# Patient Record
Sex: Male | Born: 1960 | Race: Black or African American | Hispanic: No | Smoking: Current every day smoker
Health system: Southern US, Community
[De-identification: ages and names within clinical notes are randomized; demographics above are authoritative.]

## PROBLEM LIST (undated history)

## (undated) ENCOUNTER — Emergency Department (HOSPITAL_COMMUNITY): Admission: EM | Payer: Medicaid Other | Source: Home / Self Care

## (undated) DIAGNOSIS — I25811 Atherosclerosis of native coronary artery of transplanted heart without angina pectoris: Secondary | ICD-10-CM

## (undated) DIAGNOSIS — F32A Depression, unspecified: Secondary | ICD-10-CM

## (undated) DIAGNOSIS — E119 Type 2 diabetes mellitus without complications: Secondary | ICD-10-CM

## (undated) DIAGNOSIS — K4090 Unilateral inguinal hernia, without obstruction or gangrene, not specified as recurrent: Secondary | ICD-10-CM

## (undated) DIAGNOSIS — F329 Major depressive disorder, single episode, unspecified: Secondary | ICD-10-CM

## (undated) DIAGNOSIS — E785 Hyperlipidemia, unspecified: Secondary | ICD-10-CM

## (undated) DIAGNOSIS — I639 Cerebral infarction, unspecified: Secondary | ICD-10-CM

## (undated) DIAGNOSIS — D649 Anemia, unspecified: Secondary | ICD-10-CM

## (undated) DIAGNOSIS — I10 Essential (primary) hypertension: Secondary | ICD-10-CM

## (undated) HISTORY — DX: Unilateral inguinal hernia, without obstruction or gangrene, not specified as recurrent: K40.90

## (undated) HISTORY — DX: Anemia, unspecified: D64.9

## (undated) HISTORY — DX: Essential (primary) hypertension: I10

## (undated) HISTORY — DX: Type 2 diabetes mellitus without complications: E11.9

## (undated) HISTORY — DX: Hyperlipidemia, unspecified: E78.5

## (undated) HISTORY — PX: HERNIA REPAIR: SHX51

## (undated) HISTORY — DX: Depression, unspecified: F32.A

## (undated) HISTORY — DX: Major depressive disorder, single episode, unspecified: F32.9

## (undated) HISTORY — DX: Atherosclerosis of native coronary artery of transplanted heart without angina pectoris: I25.811

## (undated) MED FILL — Ferumoxytol Inj 510 MG/17ML (30 MG/ML) (Elemental Fe): INTRAVENOUS | Qty: 17 | Status: AC

## (undated) NOTE — *Deleted (*Deleted)
Name: Vincent Guerrero MRN: [id number] DOB: August 29, 1960 52 y.o. PCP: Simone Curia, MD  Date of Admission: 11/25/2019  3:49 PM Date of Discharge: 11/28/2019 Attending Physician: Gust Rung, DO  Discharge Diagnosis: 1. Symptomatic Iron Deficiency Anemia in the Setting of Gastric Ulcerations 2. Chest Pain with EKG Changes/CAD s/p MI and RCA stent in 2010, Moderate Aortic Stenosis  Discharge Medications: Allergies as of 11/28/2019   No Known Allergies     Medication List    STOP taking these medications   BC HEADACHE POWDER PO   brimonidine 0.2 % ophthalmic solution Commonly known as: ALPHAGAN   Difluprednate 0.05 % Emul   potassium chloride SA 20 MEQ tablet Commonly known as: Klor-Con M20     TAKE these medications   acetaminophen 325 MG tablet Commonly known as: TYLENOL Take 2 tablets (650 mg total) by mouth every 6 (six) hours as needed for mild pain (or Fever >/= 101).   buPROPion 150 MG 12 hr tablet Commonly known as: Zyban Start with 1 tablet daily for 3 days, then increase to 1 tablet twice daily.  Start 1 week before your quit date.   Centrum Silver 50+Men Tabs Take 1 tablet by mouth daily with breakfast.   glipiZIDE 10 MG tablet Commonly known as: GLUCOTROL Take 10 mg by mouth 2 (two) times daily before a meal.   isosorbide mononitrate 60 MG 24 hr tablet Commonly known as: IMDUR Take 1 tablet (60 mg total) by mouth daily. Start taking on: November 29, 2019 What changed:   medication strength  Another medication with the same name was removed. Continue taking this medication, and follow the directions you see here.   lisinopril 20 MG tablet Commonly known as: ZESTRIL Take 1 tablet (20 mg total) by mouth 2 (two) times daily.   metFORMIN 1000 MG tablet Commonly known as: GLUCOPHAGE Take 1,000 mg by mouth 2 (two) times daily with a meal.   metoprolol tartrate 50 MG tablet Commonly known as: LOPRESSOR Take 1 tablet (50 mg total) by mouth 2  (two) times daily.   POTASSIUM PO Take 1 tablet by mouth 3 (three) times a week.   Repatha Pushtronex System 420 MG/3.5ML Soct Generic drug: Evolocumab with Infusor Inject 420 mg into the skin every 30 (thirty) days.   Rocklatan 0.02-0.005 % Soln Generic drug: Netarsudil-Latanoprost Place 1 drop into the right eye at bedtime.       Disposition and follow-up:   Mr.Lavonne B Rueb was discharged from [facility] in Stable condition.  At the hospital follow up visit please address:    Symptomatic Iron Deficiency Anemia in the Setting of Gastric Ulcerations  - F/U With PCP for CBC  - Discuss alternatives to Goody's powder  - Continue Protonix 40 mg twice daily  Chest Pain with EKG Changes/CAD s/p MI and RCA stent in 2010, Moderate Aortic Stenosis  - F/U Cardiology appointment   - Continue Lisinopril 40 mg twice daily  - Continue Imdur 60 mg daily  2.  Labs / imaging needed at time of follow-up: CBC  3.  Pending labs/ test needing follow-up: None  Follow-up Appointments:  Follow-up Information    Tobb, Kardie, DO Follow up on 12/22/2019.   Specialty: Cardiology Why: Cardiology Hospital Follow-up on 12/22/2019 at 2:20 PM. Please call the office if needing a different date or time.  Contact information: [address] [place] [address] [phone]        Simone Curia, MD. Schedule an appointment  as soon as possible for a visit in 1 week(s).   Specialty: Internal Medicine Contact information: [address] [place] [address] [phone]               Hospital Course by problem list: 1. Symptomatic Iron Deficiency Anemia in the Setting of Gastric Ulcerations ***  2. Chest Pain with EKG Changes/CAD s/p MI and RCA stent in 2010, Moderate Aortic Stenosis ***  Discharge Vitals:   BP 138/70   Pulse 75   Temp 99.6 F (37.6 C) (Oral)   Resp 18   Ht 5\' 10"  (1.778 m)   Wt 90.4 kg   SpO2 100%   BMI 28.60 kg/m    Pertinent Labs, Studies, and Procedures:    Ref Range & Units 4 d ago 3 wk ago 3 yr ago  WBC 3.4 - 10.8 x10E3/uL 10.1  9.6  11.8High   RBC 4.14 - 5.80 x10E6/uL 4.54  4.91  5.08   Hemoglobin 13.0 - 17.7 g/dL 2.1HYQ  6.5HQI  69.6EXB   Hematocrit 37.5 - 51.0 % 28.4Low  31.4Low  39.6   MCV 79 - 97 fL 63Low  64Low  78Low   MCH 26.6 - 33.0 pg 17.4Low  17.1Low  24.6Low   MCHC 31 - 35 g/dL 28.4XLK  44.0NUU  72.5   RDW 11.6 - 15.4 % 20.1High  21.6High  17.6High R   Platelets 150 - 450 x10E3/uL 303  325  264 R   Neutrophils Not Estab. % 60     Lymphs Not Estab. % 29     Monocytes Not Estab. % 10     Eos Not Estab. % 1     Basos Not Estab. % 0     Neutrophils Absolute 1.40 - 7.00 x10E3/uL 6.0     Lymphocytes Absolute 0 - 3 x10E3/uL 2.9     Monocytes Absolute 0 - 0 x10E3/uL 1.0High     EOS (ABSOLUTE) 0.0 - 0.4 x10E3/uL 0.1     Basophils Absolute 0 - 0 x10E3/uL 0.0     Immature Granulocytes Not Estab. % 0     Immature Grans (Abs) 0.0 - 0.1 x10E3/uL 0.0       Ref Range & Units 3 d ago  Hgb A1c MFr Bld 4.8 - 5.6 % 6.3High    Component 3 d ago  ABO/RH(D) B POS     Ref Range & Units 2 d ago  Iron 45 - 182 ug/dL 36UYQ   TIBC 034 - 742 ug/dL 595   Saturation Ratios 17.9 - 39.5 % 4Low   UIBC ug/dL 638     Ref Range & Units 2 d ago  Ferritin 24 - 336 ng/mL 4Low         Ref Range & Units 1 d ago 11 mo ago  Cholesterol 0 - 200 mg/dL 82    Triglycerides <756 mg/dL 60  433IRJJ R   HDL >88 mg/dL 41YSA  43 R   Total CHOL/HDL Ratio RATIO 2.2  4.0 R, CM   VLDL 0 - 40 mg/dL 12    LDL Cholesterol 0 - 99 mg/dL 33      COLONOSCOPY (63/02/6008): The perianal and digital rectal examinations were normal. Findings: The terminal ileum appeared normal. The entire examined colon appeared normal on direct and retroflexion views. - The examined portion of the ileum was normal. - The entire examined colon is normal on direct and retroflexion views. - No  specimens collected.  EGD (11/27/2019): The  esophagus was normal. Findings: Two non-bleeding superficial gastric ulcers were found in the gastric antrum and pre-pyloric area. The largest lesion was 8 mm in largest dimension. The antral ulcer had a friable edge. For hemostasis, two hemostatic clips were successful and one hemostatic clip was unsuccessfully placed (MR conditional). There was no bleeding at the end of the procedure. Biopsies were taken from the antrum and gastric body with a cold forceps for histology to rule out H pylori. The cardia and gastric fundus were normal on retroflexion. The examined duodenum was normal.  Discharge Instructions: Discharge Instructions    Call MD for:  difficulty breathing, headache or visual disturbances   Complete by: As directed    Call MD for:  extreme fatigue   Complete by: As directed    Call MD for:  persistant dizziness or light-headedness   Complete by: As directed    Call MD for:  persistant nausea and vomiting   Complete by: As directed    Call MD for:  severe uncontrolled pain   Complete by: As directed    Diet - low sodium heart healthy   Complete by: As directed    Increase activity slowly   Complete by: As directed       Signed: Dolan Amen, MD 11/28/2019, 12:45 PM   Pager: [phone]

---

## 2000-06-24 ENCOUNTER — Inpatient Hospital Stay (HOSPITAL_COMMUNITY): Admission: EM | Admit: 2000-06-24 | Discharge: 2000-06-27 | Payer: Self-pay | Admitting: Emergency Medicine

## 2000-06-28 ENCOUNTER — Ambulatory Visit (HOSPITAL_BASED_OUTPATIENT_CLINIC_OR_DEPARTMENT_OTHER): Admission: RE | Admit: 2000-06-28 | Discharge: 2000-06-28 | Payer: Self-pay | Admitting: Orthopedic Surgery

## 2000-07-05 ENCOUNTER — Ambulatory Visit (HOSPITAL_BASED_OUTPATIENT_CLINIC_OR_DEPARTMENT_OTHER): Admission: RE | Admit: 2000-07-05 | Discharge: 2000-07-05 | Payer: Self-pay | Admitting: Orthopedic Surgery

## 2001-07-17 ENCOUNTER — Inpatient Hospital Stay (HOSPITAL_COMMUNITY): Admission: EM | Admit: 2001-07-17 | Discharge: 2001-07-18 | Payer: Self-pay | Admitting: Emergency Medicine

## 2001-07-17 ENCOUNTER — Encounter: Payer: Self-pay | Admitting: *Deleted

## 2001-07-17 ENCOUNTER — Encounter: Payer: Self-pay | Admitting: Emergency Medicine

## 2016-05-06 DIAGNOSIS — I2510 Atherosclerotic heart disease of native coronary artery without angina pectoris: Secondary | ICD-10-CM

## 2016-05-06 DIAGNOSIS — I639 Cerebral infarction, unspecified: Secondary | ICD-10-CM | POA: Insufficient documentation

## 2016-05-06 HISTORY — DX: Atherosclerotic heart disease of native coronary artery without angina pectoris: I25.10

## 2016-05-09 DIAGNOSIS — K4021 Bilateral inguinal hernia, without obstruction or gangrene, recurrent: Secondary | ICD-10-CM

## 2016-05-09 HISTORY — DX: Bilateral inguinal hernia, without obstruction or gangrene, recurrent: K40.21

## 2016-05-10 ENCOUNTER — Encounter: Payer: Self-pay | Admitting: Physician Assistant

## 2016-05-10 NOTE — Progress Notes (Signed)
Cardiology Office Note    Date:  05/11/2016   ID:  HAVOC SANLUIS, DOB [birth year], MRN [id number]  PCP:  Dr. Nedra Hai - in [place] Cardiologist:  New  CC: pre op clearance - bilateral inguinal hernia repair  History of Present Illness:  Vincent Guerrero is a 56 y.o. male with a history of tobacco abuse, MI with stent placement in 2010 in [place], CVA (2011) in [place], DMT2, HTN, and HLD that presents to clinic for pre operative clearance.   He has a history of CAD with MI in 2010 in [place]. No records available. He followed with a cardiologist at [facility] for about 6 months but then lost to follow up. He does not take a daily baby Asipirin but takes BC powder at least twice a day for aches and pains. Does not take a statin but says he has his cholesterol levels are under pretty good control by eating [place] nuts (heard it from Dr. Neil Crouch). He did not tolerate statins in the past. He states that his diabetes is pretty under control. Dr. Nedra Hai, his PCP, recently did a bunch of lab work and it was okay. None of this sent to me.    He had a right inguinal hernia repair on right side around 1995 and now having trouble on right side again. He saw Dr. Earney Navy in [place] who is planning on repairing bilateral inguinal hernias and asking for cardiac clearance.   He has been smoking about 1 PPD ~ 40 years. 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 He denies exertional chest pain. He does not formally exercise. No CP or SOB. No LE edema, orthopnea or PND. No dizziness or syncope. No blood in stool or urine. Occasionally get palpitations. He basically feels pretty good. Mostly limited by his groin pain and needing hernia repair.    Past Medical History:  Diagnosis Date  . Depression   . Diabetes mellitus (HCC)   . Hypertension   . Inguinal hernia without obstruction or gangrene    BILATERAL    Past Surgical History:  Procedure Laterality Date  . HERNIA REPAIR      Current  Medications: Outpatient Medications Prior to Visit  Medication Sig Dispense Refill  . Aspirin-Caffeine 845-65 MG PACK Take 1 packet by mouth every 4 (four) hours as needed. BC     . glipiZIDE (GLUCOTROL) 10 MG tablet Take 10 mg by mouth 2 (two) times daily before a meal.     . metFORMIN (GLUMETZA) 1000 MG (MOD) 24 hr tablet Take 1,000 mg by mouth 2 (two) times daily with a meal.     . lisinopril (PRINIVIL,ZESTRIL) 20 MG tablet Take 20 mg by mouth 2 (two) times daily.     . metoprolol (LOPRESSOR) 50 MG tablet Take 50 mg by mouth 2 (two) times daily.     No facility-administered medications prior to visit.      Allergies:   Patient has no known allergies.   Social History   Social History  . Marital status: Legally Separated    Spouse name: N/A  . Number of children: N/A  . Years of education: N/A   Social History Main Topics  . Smoking status: Current Every Day Smoker    Types: Cigarettes  . Smokeless tobacco: Current User    Types: Chew  . Alcohol use No  . Drug use: No  . Sexual activity: Not Asked   Other Topics Concern  . None  Social History Narrative  . None     Family History:  The patient's family history includes CAD in his father; Heart failure in his mother.      ROS:   Please see the history of present illness.    ROS All other systems reviewed and are negative.   PHYSICAL EXAM:   VS:  BP (!) 160/82 (BP Location: Left Arm)   Pulse (!) 59   Ht  (1.778 m)   Wt 220 lb 12.8 oz (100.2 kg)   BMI 31.68 kg/m    GEN: Well nourished, well developed, in no acute distress  HEENT: normal  Neck: no JVD, carotid bruits, or masses Cardiac: RRR; + murmurs, rubs, or gallops,no edema  Respiratory:  clear to auscultation bilaterally, normal work of breathing GI: soft, nontender, nondistended, + BS MS: no deformity or atrophy  Skin: warm and dry, no rash Neuro:  Alert and Oriented x 3, Strength and sensation are intact Psych: euthymic mood, full  affect    Wt Readings from Last 3 Encounters:  05/11/16 220 lb 12.8 oz (100.2 kg)     Studies/Labs Reviewed:   EKG:  EKG is ordered today.  The ekg ordered today demonstrates Sinus bradycardia HR 59  Recent Labs: No results found for requested labs within last 8760 hours.   Lipid Panel No results found for: CHOL, TRIG, HDL, CHOLHDL, VLDL, LDLCALC, LDLDIRECT  Additional studies/ records that were reviewed today include:  none   ASSESSMENT & PLAN:   Pre operative risk assessment:  Will a nuclear stress test before clearing his for surgery. Also will get a 2D ECHO given 3/6 murmur heard on physical exam. I have also asked that Dr. Nedra Hai fax [place] all blood work.   HTN: BP currently uncontrolled on Lopressor  BID and Lisinorpil  BID. Cannot titrate BB due to resting bradcyardia. Will add amldopine  daily to help with BP control.   CAD s/p previous stenting: no records available on this. I have advised that he stop taking BC powder and only take a daily baby aspirin, but he said its the only thing that helps his pain. This has 845 mg of Aspirin in it and he takes it at least 2 times a day. Continue BB. Intolerant to statins.   DMT2: continue glipzide and metformin. Followed by Dr. Nedra Hai  HLD: intolerant to statins, followed by Dr. Nedra Hai  Tobacco abuse: he has tried to quit in the past but has not been successful.   Murmur: he has been told he has a murmur before but said he never had an echo. Will get an echo.   Medication Adjustments/Labs and Tests Ordered: Current medicines are reviewed at length with the patient today.  Concerns regarding medicines are outlined above.  Medication changes, Labs and Tests ordered today are listed in the Patient Instructions below. Patient Instructions  Medication Instructions:  Your physician has recommended you make the following change in your medication:  1.  START Amlodipine 5 mg taking 1 tablet daily  Labwork: None ordered PLEASE  HAVE YOUR PCP FAX [place] YOUR LAB RESULTS TO [phone]  Testing/Procedures: Your physician has requested that you have an echocardiogram. Echocardiography is a painless test that uses sound waves to create images of your heart. It provides your doctor with information about the size and shape of your heart and how well your heart's chambers and valves are working. This procedure takes approximately one hour. There are no restrictions for this procedure.  Your physician  has requested that you have a lexiscan myoview. For further information please visit [email or web address] Please follow instruction sheet, as given.    Follow-Up: Your physician recommends that you schedule a follow-up appointment in: 1 MONTH WITH KATIE Tavian Callander, PA-C   Any Other Special Instructions Will Be Listed Below (If Applicable). Echocardiogram An echocardiogram, or echocardiography, uses sound waves (ultrasound) to produce an image of your heart. The echocardiogram is simple, painless, obtained within a short period of time, and offers valuable information to your health care provider. The images from an echocardiogram can provide information such as:  Evidence of coronary artery disease (CAD).  Heart size.  Heart muscle function.  Heart valve function.  Aneurysm detection.  Evidence of a past heart attack.  Fluid buildup around the heart.  Heart muscle thickening.  Assess heart valve function. Tell a health care provider about:  Any allergies you have.  All medicines you are taking, including vitamins, herbs, eye drops, creams, and over-the-counter medicines.  Any problems you or family members have had with anesthetic medicines.  Any blood disorders you have.  Any surgeries you have had.  Any medical conditions you have.  Whether you are pregnant or may be pregnant. What happens before the procedure? No special preparation is needed. Eat and drink normally. What happens during the  procedure?  In order to produce an image of your heart, gel will be applied to your chest and a wand-like tool (transducer) will be moved over your chest. The gel will help transmit the sound waves from the transducer. The sound waves will harmlessly bounce off your heart to allow the heart images to be captured in real-time motion. These images will then be recorded.  You may need an IV to receive a medicine that improves the quality of the pictures. What happens after the procedure? You may return to your normal schedule including diet, activities, and medicines, unless your health care provider tells you otherwise. This information is not intended to replace advice given to you by your health care provider. Make sure you discuss any questions you have with your health care provider. Document Released: 01/20/2000 Document Revised: 09/10/2015 Document Reviewed: 09/29/2012 Elsevier Interactive Patient Education  2017 Elsevier Inc.    Pharmacologic Stress Electrocardiogram Introduction A pharmacologic stress electrocardiogram is a heart (cardiac) test that uses nuclear imaging to evaluate the blood supply to your heart. This test may also be called a pharmacologic stress electrocardiography. Pharmacologic means that a medicine is used to increase your heart rate and blood pressure. This stress test is done to find areas of poor blood flow to the heart by determining the extent of coronary artery disease (CAD). Some people exercise on a treadmill, which naturally increases the blood flow to the heart. For those people unable to exercise on a treadmill, a medicine is used. This medicine stimulates your heart and will cause your heart to beat harder and more quickly, as if you were exercising. Pharmacologic stress tests can help determine:  The adequacy of blood flow to your heart during increased levels of activity in order to clear you for discharge home.  The extent of coronary artery blockage  caused by CAD.  Your prognosis if you have suffered a heart attack.  The effectiveness of cardiac procedures done, such as an angioplasty, which can increase the circulation in your coronary arteries.  Causes of chest pain or pressure. LET [facility] CARE PROVIDER KNOW ABOUT:  Any allergies you have.  All medicines you are taking,  including vitamins, herbs, eye drops, creams, and over-the-counter medicines.  Previous problems you or members of your family have had with the use of anesthetics.  Any blood disorders you have.  Previous surgeries you have had.  Medical conditions you have.  Possibility of pregnancy, if this applies.  If you are currently breastfeeding. RISKS AND COMPLICATIONS Generally, this is a safe procedure. However, as with any procedure, complications can occur. Possible complications include:  You develop pain or pressure in the following areas:  Chest.  Jaw or neck.  Between your shoulder blades.  Radiating down your left arm.  Headache.  Dizziness or light-headedness.  Shortness of breath.  Increased or irregular heartbeat.  Low blood pressure.  Nausea or vomiting.  Flushing.  Redness going up the arm and slight pain during injection of medicine.  Heart attack (rare). BEFORE THE PROCEDURE  Avoid all forms of caffeine for 24 hours before your test or as directed by your health care provider. This includes coffee, tea (even decaffeinated tea), caffeinated sodas, chocolate, cocoa, and certain pain medicines.  Follow your health care provider's instructions regarding eating and drinking before the test.  Take your medicines as directed at regular times with water unless instructed otherwise. Exceptions may include:  If you have diabetes, ask how you are to take your insulin or pills. It is common to adjust insulin dosing the morning of the test.  If you are taking beta-blocker medicines, it is important to talk to your health care  provider about these medicines well before the date of your test. Taking beta-blocker medicines may interfere with the test. In some cases, these medicines need to be changed or stopped 24 hours or more before the test.  If you wear a nitroglycerin patch, it may need to be removed prior to the test. Ask your health care provider if the patch should be removed before the test.  If you use an inhaler for any breathing condition, bring it with you to the test.  If you are an outpatient, bring a snack so you can eat right after the stress phase of the test.  Do not smoke for 4 hours prior to the test or as directed by your health care provider.  Do not apply lotions, powders, creams, or oils on your chest prior to the test.  Wear comfortable shoes and clothing. Let your health care provider know if you were unable to complete or follow the preparations for your test. PROCEDURE  Multiple patches (electrodes) will be put on your chest. If needed, small areas of your chest may be shaved to get better contact with the electrodes. Once the electrodes are attached to your body, multiple wires will be attached to the electrodes, and your heart rate will be monitored.  An IV access will be started. A nuclear trace (isotope) is given. The isotope may be given intravenously, or it may be swallowed. Nuclear refers to several types of radioactive isotopes, and the nuclear isotope lights up the arteries so that the nuclear images are clear. The isotope is absorbed by your body. This results in low radiation exposure.  A resting nuclear image is taken to show how your heart functions at rest.  A medicine is given through the IV access.  A second scan is done about 1 hour after the medicine injection and determines how your heart functions under stress.  During this stress phase, you will be connected to an electrocardiogram machine. Your blood pressure and oxygen levels will be  monitored. What to expect  after the procedure  Your heart rate and blood pressure will be monitored after the test.  You may return to your normal schedule, including diet,activities, and medicines, unless your health care provider tells you otherwise. This information is not intended to replace advice given to you by your health care provider. Make sure you discuss any questions you have with your health care provider. Document Released: 06/10/2008 Document Revised: 06/30/2015 Document Reviewed: 08/01/2015 Elsevier Interactive Patient Education  2017 [employer].   If you need a refill on your cardiac medications before your next appointment, please call your pharmacy.      Signed, Cline Crock, PA-C  05/11/2016 12:15 PM    [facility] [address] [place], [place], [place]  [address] Phone: [phone]; Fax: [phone]

## 2016-05-11 ENCOUNTER — Encounter (INDEPENDENT_AMBULATORY_CARE_PROVIDER_SITE_OTHER): Payer: Self-pay

## 2016-05-11 ENCOUNTER — Encounter: Payer: Self-pay | Admitting: Physician Assistant

## 2016-05-11 ENCOUNTER — Ambulatory Visit (INDEPENDENT_AMBULATORY_CARE_PROVIDER_SITE_OTHER): Payer: Self-pay | Admitting: Physician Assistant

## 2016-05-11 ENCOUNTER — Other Ambulatory Visit: Payer: Self-pay | Admitting: Physician Assistant

## 2016-05-11 VITALS — BP 160/82 | HR 59 | Ht 70.0 in | Wt 220.8 lb

## 2016-05-11 DIAGNOSIS — E785 Hyperlipidemia, unspecified: Secondary | ICD-10-CM

## 2016-05-11 DIAGNOSIS — I2510 Atherosclerotic heart disease of native coronary artery without angina pectoris: Secondary | ICD-10-CM

## 2016-05-11 DIAGNOSIS — R011 Cardiac murmur, unspecified: Secondary | ICD-10-CM

## 2016-05-11 DIAGNOSIS — Z720 Tobacco use: Secondary | ICD-10-CM

## 2016-05-11 DIAGNOSIS — Z01810 Encounter for preprocedural cardiovascular examination: Secondary | ICD-10-CM

## 2016-05-11 DIAGNOSIS — E118 Type 2 diabetes mellitus with unspecified complications: Secondary | ICD-10-CM

## 2016-05-11 DIAGNOSIS — I10 Essential (primary) hypertension: Secondary | ICD-10-CM

## 2016-05-11 MED ORDER — LISINOPRIL 20 MG PO TABS: 20.0000 mg | ORAL_TABLET | Freq: Two times a day (BID) | ORAL | 2 refills | Status: DC

## 2016-05-11 MED ORDER — AMLODIPINE BESYLATE 5 MG PO TABS: 5.0000 mg | ORAL_TABLET | Freq: Every day | ORAL | 3 refills | Status: DC

## 2016-05-11 MED ORDER — METOPROLOL TARTRATE 50 MG PO TABS: 50.0000 mg | ORAL_TABLET | Freq: Two times a day (BID) | ORAL | 2 refills | Status: AC

## 2016-05-11 NOTE — Patient Instructions (Signed)
Medication Instructions:  Your physician has recommended you make the following change in your medication:  1.  START Amlodipine 5 mg taking 1 tablet daily  Labwork: None ordered PLEASE HAVE YOUR PCP FAX [place] YOUR LAB RESULTS TO [phone]  Testing/Procedures: Your physician has requested that you have an echocardiogram. Echocardiography is a painless test that uses sound waves to create images of your heart. It provides your doctor with information about the size and shape of your heart and how well your heart's chambers and valves are working. This procedure takes approximately one hour. There are no restrictions for this procedure.  Your physician has requested that you have a lexiscan myoview. For further information please visit [email or web address] Please follow instruction sheet, as given.    Follow-Up: Your physician recommends that you schedule a follow-up appointment in: 1 MONTH WITH KATIE THOMPSON, PA-C   Any Other Special Instructions Will Be Listed Below (If Applicable). Echocardiogram An echocardiogram, or echocardiography, uses sound waves (ultrasound) to produce an image of your heart. The echocardiogram is simple, painless, obtained within a short period of time, and offers valuable information to your health care provider. The images from an echocardiogram can provide information such as:  Evidence of coronary artery disease (CAD).  Heart size.  Heart muscle function.  Heart valve function.  Aneurysm detection.  Evidence of a past heart attack.  Fluid buildup around the heart.  Heart muscle thickening.  Assess heart valve function. Tell a health care provider about:  Any allergies you have.  All medicines you are taking, including vitamins, herbs, eye drops, creams, and over-the-counter medicines.  Any problems you or family members have had with anesthetic medicines.  Any blood disorders you have.  Any surgeries you have had.  Any medical  conditions you have.  Whether you are pregnant or may be pregnant. What happens before the procedure? No special preparation is needed. Eat and drink normally. What happens during the procedure?  In order to produce an image of your heart, gel will be applied to your chest and a wand-like tool (transducer) will be moved over your chest. The gel will help transmit the sound waves from the transducer. The sound waves will harmlessly bounce off your heart to allow the heart images to be captured in real-time motion. These images will then be recorded.  You may need an IV to receive a medicine that improves the quality of the pictures. What happens after the procedure? You may return to your normal schedule including diet, activities, and medicines, unless your health care provider tells you otherwise. This information is not intended to replace advice given to you by your health care provider. Make sure you discuss any questions you have with your health care provider. Document Released: 01/20/2000 Document Revised: 09/10/2015 Document Reviewed: 09/29/2012 Elsevier Interactive Patient Education  2017 Elsevier Inc.    Pharmacologic Stress Electrocardiogram Introduction A pharmacologic stress electrocardiogram is a heart (cardiac) test that uses nuclear imaging to evaluate the blood supply to your heart. This test may also be called a pharmacologic stress electrocardiography. Pharmacologic means that a medicine is used to increase your heart rate and blood pressure. This stress test is done to find areas of poor blood flow to the heart by determining the extent of coronary artery disease (CAD). Some people exercise on a treadmill, which naturally increases the blood flow to the heart. For those people unable to exercise on a treadmill, a medicine is used. This medicine stimulates your heart and will cause  your heart to beat harder and more quickly, as if you were exercising. Pharmacologic stress  tests can help determine:  The adequacy of blood flow to your heart during increased levels of activity in order to clear you for discharge home.  The extent of coronary artery blockage caused by CAD.  Your prognosis if you have suffered a heart attack.  The effectiveness of cardiac procedures done, such as an angioplasty, which can increase the circulation in your coronary arteries.  Causes of chest pain or pressure. LET [facility] CARE PROVIDER KNOW ABOUT:  Any allergies you have.  All medicines you are taking, including vitamins, herbs, eye drops, creams, and over-the-counter medicines.  Previous problems you or members of your family have had with the use of anesthetics.  Any blood disorders you have.  Previous surgeries you have had.  Medical conditions you have.  Possibility of pregnancy, if this applies.  If you are currently breastfeeding. RISKS AND COMPLICATIONS Generally, this is a safe procedure. However, as with any procedure, complications can occur. Possible complications include:  You develop pain or pressure in the following areas:  Chest.  Jaw or neck.  Between your shoulder blades.  Radiating down your left arm.  Headache.  Dizziness or light-headedness.  Shortness of breath.  Increased or irregular heartbeat.  Low blood pressure.  Nausea or vomiting.  Flushing.  Redness going up the arm and slight pain during injection of medicine.  Heart attack (rare). BEFORE THE PROCEDURE  Avoid all forms of caffeine for 24 hours before your test or as directed by your health care provider. This includes coffee, tea (even decaffeinated tea), caffeinated sodas, chocolate, cocoa, and certain pain medicines.  Follow your health care provider's instructions regarding eating and drinking before the test.  Take your medicines as directed at regular times with water unless instructed otherwise. Exceptions may include:  If you have diabetes, ask how you  are to take your insulin or pills. It is common to adjust insulin dosing the morning of the test.  If you are taking beta-blocker medicines, it is important to talk to your health care provider about these medicines well before the date of your test. Taking beta-blocker medicines may interfere with the test. In some cases, these medicines need to be changed or stopped 24 hours or more before the test.  If you wear a nitroglycerin patch, it may need to be removed prior to the test. Ask your health care provider if the patch should be removed before the test.  If you use an inhaler for any breathing condition, bring it with you to the test.  If you are an outpatient, bring a snack so you can eat right after the stress phase of the test.  Do not smoke for 4 hours prior to the test or as directed by your health care provider.  Do not apply lotions, powders, creams, or oils on your chest prior to the test.  Wear comfortable shoes and clothing. Let your health care provider know if you were unable to complete or follow the preparations for your test. PROCEDURE  Multiple patches (electrodes) will be put on your chest. If needed, small areas of your chest may be shaved to get better contact with the electrodes. Once the electrodes are attached to your body, multiple wires will be attached to the electrodes, and your heart rate will be monitored.  An IV access will be started. A nuclear trace (isotope) is given. The isotope may be given intravenously,  or it may be swallowed. Nuclear refers to several types of radioactive isotopes, and the nuclear isotope lights up the arteries so that the nuclear images are clear. The isotope is absorbed by your body. This results in low radiation exposure.  A resting nuclear image is taken to show how your heart functions at rest.  A medicine is given through the IV access.  A second scan is done about 1 hour after the medicine injection and determines how your  heart functions under stress.  During this stress phase, you will be connected to an electrocardiogram machine. Your blood pressure and oxygen levels will be monitored. What to expect after the procedure  Your heart rate and blood pressure will be monitored after the test.  You may return to your normal schedule, including diet,activities, and medicines, unless your health care provider tells you otherwise. This information is not intended to replace advice given to you by your health care provider. Make sure you discuss any questions you have with your health care provider. Document Released: 06/10/2008 Document Revised: 06/30/2015 Document Reviewed: 08/01/2015 Elsevier Interactive Patient Education  2017 [employer].   If you need a refill on your cardiac medications before your next appointment, please call your pharmacy.

## 2016-05-24 ENCOUNTER — Telehealth (HOSPITAL_COMMUNITY): Payer: Self-pay | Admitting: *Deleted

## 2016-05-24 NOTE — Telephone Encounter (Signed)
Patient given detailed instructions per Myocardial Perfusion Study Information Sheet for the test on 05/29/16. Patient notified to arrive 15 minutes early and that it is imperative to arrive on time for appointment to keep from having the test rescheduled.  If you need to cancel or reschedule your appointment, please call the office within 24 hours of your appointment. Failure to do so may result in a cancellation of your appointment, and a $50 no show fee. Patient verbalized understanding.  [name]     

## 2016-05-29 ENCOUNTER — Ambulatory Visit: Payer: Self-pay | Admitting: Physician Assistant

## 2016-05-29 ENCOUNTER — Other Ambulatory Visit: Payer: Self-pay

## 2016-05-29 ENCOUNTER — Encounter: Payer: Self-pay | Admitting: Physician Assistant

## 2016-05-29 ENCOUNTER — Ambulatory Visit (HOSPITAL_COMMUNITY): Payer: Self-pay | Attending: Cardiovascular Disease

## 2016-05-29 ENCOUNTER — Ambulatory Visit (HOSPITAL_BASED_OUTPATIENT_CLINIC_OR_DEPARTMENT_OTHER): Payer: Self-pay

## 2016-05-29 DIAGNOSIS — E785 Hyperlipidemia, unspecified: Secondary | ICD-10-CM | POA: Insufficient documentation

## 2016-05-29 DIAGNOSIS — R011 Cardiac murmur, unspecified: Secondary | ICD-10-CM | POA: Insufficient documentation

## 2016-05-29 DIAGNOSIS — Z720 Tobacco use: Secondary | ICD-10-CM | POA: Insufficient documentation

## 2016-05-29 DIAGNOSIS — I2510 Atherosclerotic heart disease of native coronary artery without angina pectoris: Secondary | ICD-10-CM

## 2016-05-29 DIAGNOSIS — I252 Old myocardial infarction: Secondary | ICD-10-CM | POA: Insufficient documentation

## 2016-05-29 DIAGNOSIS — I10 Essential (primary) hypertension: Secondary | ICD-10-CM | POA: Insufficient documentation

## 2016-05-29 DIAGNOSIS — I352 Nonrheumatic aortic (valve) stenosis with insufficiency: Secondary | ICD-10-CM | POA: Insufficient documentation

## 2016-05-29 LAB — MYOCARDIAL PERFUSION IMAGING
CHL CUP NUCLEAR SRS: 2
CSEPPHR: 90 {beats}/min
LV dias vol: 161 mL (ref 62–150)
LV sys vol: 81 mL
RATE: 0.27
Rest HR: 59 {beats}/min
SDS: 0
SSS: 2
TID: 1.08

## 2016-05-29 MED ORDER — TECHNETIUM TC 99M TETROFOSMIN IV KIT
32.1000 | PACK | Freq: Once | INTRAVENOUS | Status: AC | PRN
  Administered 2016-05-29: 32.1 via INTRAVENOUS
  Filled 2016-05-29: qty 33

## 2016-05-29 MED ORDER — REGADENOSON 0.4 MG/5ML IV SOLN
0.4000 mg | Freq: Once | INTRAVENOUS | Status: AC
  Administered 2016-05-29: 0.4 mg via INTRAVENOUS

## 2016-05-29 MED ORDER — TECHNETIUM TC 99M TETROFOSMIN IV KIT
10.2000 | PACK | Freq: Once | INTRAVENOUS | Status: AC | PRN
  Administered 2016-05-29: 10.2 via INTRAVENOUS
  Filled 2016-05-29: qty 11

## 2016-05-30 NOTE — Progress Notes (Signed)
Cardiology Office Note    Date:  05/31/2016   ID:  Vincent Guerrero, DOB Dec 17, 1960, MRN [id number]  PCP:  No PCP Per Patient  Cardiologist:  New  CC: follow up- discuss abnormal stress test and cath.   History of Present Illness:  Vincent Guerrero is a 56 y.o. male with a history of tobacco abuse, MI with stent placement in 2010 in [place], CVA (2011) in [place], DMT2, HTN, aortic stenosis and HLD that presents to clinic to discuss abnormal stress test.   He has a history of CAD with MI in 2010 in [place]. No records available. He followed with a cardiologist at [facility] for about 6 months but then lost to follow up. He does not take a daily baby Asipirin but takes BC powder at least twice a day for aches and pains. Does not take a statin but says he has his cholesterol levels are under pretty good control by eating [place] nuts (heard it from Dr. Neil Crouch). He did not tolerate statins in the past. He states that his diabetes is under control. Dr. Nedra Hai, his PCP, recently did a bunch of lab work and it was okay. None of this sent to me.    I saw him in clinic as a new patient on 05/11/16 for cardiology clearance prior to bilateral inguinal hernia repair. I heard a murmur on exam so I ordered an echo and also a stress test for pre op risk assessment. BP was elevated to 160/82 and I added amlodipine  daily. 2D ECHO 05/29/16 showed normal EF with G2DD and mod AS/AI. Nuclear stress test on 05/29/16 showed prior inferior infarct and mild to moderate peri-infarct ischemia; prior anteroseptal/apical infarct with mild peri-infarct ischemia; EF 50 with global hypokinesis and mild LVE.  Today he presents to clinic for follow up. No CP or SOB. No LE edema, orthopnea or PND. He had had some dizziness but no syncope. Not related to any certain No blood in stool or urine. No palpitations. Still having groin pain related to hernia.   ADDENDUM: later he told my nurse that he is having some chest pain that he  had been ignoring.     Past Medical History:  Diagnosis Date  . Depression   . Diabetes mellitus (HCC)   . Hypertension   . Inguinal hernia without obstruction or gangrene    BILATERAL    Past Surgical History:  Procedure Laterality Date  . HERNIA REPAIR      Current Medications: Outpatient Medications Prior to Visit  Medication Sig Dispense Refill  . Aspirin-Caffeine 845-65 MG PACK Take 1 packet by mouth every 4 (four) hours as needed. BC     . glipiZIDE (GLUCOTROL) 10 MG tablet Take 10 mg by mouth 2 (two) times daily before a meal.     . lisinopril (PRINIVIL,ZESTRIL) 20 MG tablet Take 1 tablet (20 mg total) by mouth 2 (two) times daily. 180 tablet 2  . metFORMIN (GLUMETZA) 1000 MG (MOD) 24 hr tablet Take 1,000 mg by mouth 2 (two) times daily with a meal.     . metoprolol (LOPRESSOR) 50 MG tablet Take 1 tablet (50 mg total) by mouth 2 (two) times daily. 180 tablet 2  . amLODipine (NORVASC) 5 MG tablet Take 1 tablet (5 mg total) by mouth daily. 90 tablet 3   No facility-administered medications prior to visit.      Allergies:   Patient has no known allergies.   Social History   Social  History  . Marital status: Legally Separated    Spouse name: N/A  . Number of children: N/A  . Years of education: N/A   Social History Main Topics  . Smoking status: Current Every Day Smoker    Types: Cigarettes  . Smokeless tobacco: Current User    Types: Chew  . Alcohol use No  . Drug use: No  . Sexual activity: Not Asked   Other Topics Concern  . None   Social History Narrative  . None     Family History:  The patient's family history includes CAD in his father; Heart failure in his mother.      ROS:   Please see the history of present illness.    ROS All other systems reviewed and are negative.   PHYSICAL EXAM:   VS:  BP 138/68   Pulse 68   Ht  (1.778 m)   Wt 217 lb 15.7 oz (98.9 kg)   SpO2 94%   BMI 31.28 kg/m    GEN: Well nourished, well developed, in  no acute distress  HEENT: normal  Neck: no JVD, carotid bruits, or masses Cardiac: RRR; 3/6 SEM @ RUSB, rubs, or gallops,no edema  Respiratory:  clear to auscultation bilaterally, normal work of breathing GI: soft, nontender, nondistended, + BS MS: no deformity or atrophy  Skin: warm and dry, no rash Neuro:  Alert and Oriented x 3, Strength and sensation are intact Psych: euthymic mood, full affect    Wt Readings from Last 3 Encounters:  05/31/16 217 lb 15.7 oz (98.9 kg)  05/29/16 220 lb (99.8 kg)  05/11/16 220 lb 12.8 oz (100.2 kg)      Studies/Labs Reviewed:   EKG:  EKG is NOT ordered today.   Recent Labs: No results found for requested labs within last 8760 hours.   Lipid Panel No results found for: CHOL, TRIG, HDL, CHOLHDL, VLDL, LDLCALC, LDLDIRECT  Additional studies/ records that were reviewed today include:  Myoview 05/29/16 Study Highlights    Nuclear stress EF: 50%.  There was no ST segment deviation noted during stress.  Defect 1: There is a large defect of severe severity present in the basal inferior and mid inferior location.  Defect 2: There is a large defect of moderate severity present in the mid anteroseptal and apex location.  This is a high risk study.  The left ventricular ejection fraction is mildly decreased (45-54%).   High risk stress nuclear study with prior inferior infarct and mild to moderate peri-infarct ischemia; prior anteroseptal/apical infarct with mild peri-infarct ischemia; EF 50 with global hypokinesis and mild LVE.     2D ECHO: 05/29/2016 LV EF: 60% -   65% Study Conclusions - Left ventricle: The cavity size was normal. Wall thickness was   normal. Systolic function was normal. The estimated ejection   fraction was in the range of 60% to 65%. Wall motion was normal;   there were no regional wall motion abnormalities. Features are   consistent with a pseudonormal left ventricular filling pattern,   with concomitant  abnormal relaxation and increased filling   pressure (grade 2 diastolic dysfunction). - Aortic valve: There was moderate stenosis. There was moderate   regurgitation. Valve area (VTI): 1.75 cm^2. Valve area (Vmax):   1.71 cm^2. Valve area (Vmean): 1.57 cm^2.   ASSESSMENT & PLAN:   Abnormal nuclear stress test: ordered for preop clearance prior to bilateral inguinal hernia repair. This was completed 05/29/16 and showed prior inferior infarct and mild  to moderate peri-infarct ischemia; prior anteroseptal/apical infarct with mild peri-infarct ischemia; EF 50 with global hypokinesis and mild LVE. He later told my nurse he was having some chest pain from time to time. I discussed case with Dr. Melburn Popper (DOD) today and he agreed with cath. I have arranged this for 06/04/16 with Dr. Katrinka Blazing. Will get pre cath labs today.   I have reviewed the risks, indications, and alternatives to cardiac catheterization and possible angioplasty/stenting with the patient. Risks include but are not limited to bleeding, infection, vascular injury, stroke, myocardial infection, arrhythmia, kidney injury, radiation-related injury in the case of prolonged fluoroscopy use, emergency cardiac surgery, and death. The patient understands the risks of serious complication is low (<1%).   Mod AS/AI: continue to monitor   HTN: BP better controlled with the addition of amlodipine  daily. Still mildly elevated at 138/68 (according to new guidelines) so I will increase amlodipine to  daily. Continue Lopressor  BID and Lisinorpil  BID  CAD s/p previous stenting: no records available on this. He takes BC powder with  of ASA in this every day. Continue BB. He is intolerant to statins. Will proceed with LHC as above given abnormal nuclear stress test.   DMT2: Labs faxed from PCP and HgA1c was 6.3 in 03/2016  HLD: most recent LDL was 123. Intolerant to statins. Will wait on results to [facility] next Monday. If he has significant  progression of CAD, I will push for him to be seen in the lipid clinic.   Tobacco abuse: still smoking about 1PPD. Discussed the importance of cessation.   Medication Adjustments/Labs and Tests Ordered: Current medicines are reviewed at length with the patient today.  Concerns regarding medicines are outlined above.  Medication changes, Labs and Tests ordered today are listed in the Patient Instructions below. Patient Instructions  Medication Instructions:  Your physician has recommended you make the following change in your medication:  1.  INCREASE the Amlodipine to 10 mg taking 1 tablet daily  Labwork: TODAY:  BMET, CBC, & PT/INR  Testing/Procedures: Your physician has requested that you have a cardiac catheterization. Cardiac catheterization is used to diagnose and/or treat various heart conditions. Doctors may recommend this procedure for a number of different reasons. The most common reason is to evaluate chest pain. Chest pain can be a symptom of coronary artery disease (CAD), and cardiac catheterization can show whether plaque is narrowing or blocking your heart's arteries. This procedure is also used to evaluate the valves, as well as measure the blood flow and oxygen levels in different parts of your heart. For further information please visit [email or web address] Please follow instruction sheet, as given.    Follow-Up: Your physician recommends that you schedule a follow-up appointment in: WILL BE SET UP AT DISCHARGE   Any Other Special Instructions Will Be Listed Below (If Applicable).    [facility] CARDIOVASCULAR DIVISION [facility] OFFICE [address], [address] [place] [address] Dept: [phone] Loc: [phone]  SIDDARTH HSIUNG  05/31/2016  You are scheduled for a Cardiac Catheterization on Monday, April 30 with Dr. Verdis Prime.  1. Please arrive at the [facility] (Main Entrance A) at [facility]:  [address] [place], [place] [address] at 10:00 AM (two hours before your procedure to ensure your preparation). Free valet parking service is available.   Special note: Every effort is made to have your procedure done on time. Please understand that emergencies sometimes delay scheduled  procedures.  2. Diet: Do not eat or drink anything after midnight prior to your procedure except sips of water to take medications.  3. Labs: You will have lab work done TODAY.  4. Medication instructions in preparation for your procedure:  MEDICATION INSTRUCTIONS: ONLY TAKE THE A.M. DOSE OF METFORMIN ON Sunday, 06/03/16 DO NOT TAKE THE METFORMIN OR GLIPIZIDE ON THE MORNING OF YOUR PROCEDURE  On the morning of your procedure, take your Aspirin and any morning medicines NOT listed above.  You may use sips of water.  5. Plan for one night stay--bring personal belongings. 6. Bring a current list of your medications and current insurance cards. 7. You MUST have a responsible person to drive you home. 8. Someone MUST be with you the first 24 hours after you arrive home or your discharge will be delayed. 9. Please wear clothes that are easy to get on and off and wear slip-on shoes.  Thank you for allowing [place] to care for you!   -- [facility]    If you need a refill on your cardiac medications before your next appointment, please call your pharmacy.      Signed, Cline Crock, PA-C  05/31/2016 2:27 PM    [facility] [address] [place], [place], [place]  [address] Phone: [phone]; Fax: [phone]

## 2016-05-31 ENCOUNTER — Encounter: Payer: Self-pay | Admitting: Physician Assistant

## 2016-05-31 ENCOUNTER — Ambulatory Visit (INDEPENDENT_AMBULATORY_CARE_PROVIDER_SITE_OTHER): Payer: Self-pay | Admitting: Physician Assistant

## 2016-05-31 VITALS — BP 138/68 | HR 68 | Ht 70.0 in | Wt 218.0 lb

## 2016-05-31 DIAGNOSIS — Z720 Tobacco use: Secondary | ICD-10-CM

## 2016-05-31 DIAGNOSIS — I352 Nonrheumatic aortic (valve) stenosis with insufficiency: Secondary | ICD-10-CM

## 2016-05-31 DIAGNOSIS — R9439 Abnormal result of other cardiovascular function study: Secondary | ICD-10-CM

## 2016-05-31 DIAGNOSIS — E785 Hyperlipidemia, unspecified: Secondary | ICD-10-CM

## 2016-05-31 DIAGNOSIS — I2510 Atherosclerotic heart disease of native coronary artery without angina pectoris: Secondary | ICD-10-CM

## 2016-05-31 DIAGNOSIS — I10 Essential (primary) hypertension: Secondary | ICD-10-CM

## 2016-05-31 MED ORDER — AMLODIPINE BESYLATE 10 MG PO TABS: 10.0000 mg | ORAL_TABLET | Freq: Every day | ORAL | 3 refills | Status: DC

## 2016-05-31 NOTE — Patient Instructions (Signed)
Medication Instructions:  Your physician has recommended you make the following change in your medication:  1.  INCREASE the Amlodipine to 10 mg taking 1 tablet daily  Labwork: TODAY:  BMET, CBC, & PT/INR  Testing/Procedures: Your physician has requested that you have a cardiac catheterization. Cardiac catheterization is used to diagnose and/or treat various heart conditions. Doctors may recommend this procedure for a number of different reasons. The most common reason is to evaluate chest pain. Chest pain can be a symptom of coronary artery disease (CAD), and cardiac catheterization can show whether plaque is narrowing or blocking your heart's arteries. This procedure is also used to evaluate the valves, as well as measure the blood flow and oxygen levels in different parts of your heart. For further information please visit [email or web address] Please follow instruction sheet, as given.    Follow-Up: Your physician recommends that you schedule a follow-up appointment in: WILL BE SET UP AT DISCHARGE   Any Other Special Instructions Will Be Listed Below (If Applicable).    [facility] CARDIOVASCULAR DIVISION [facility] OFFICE [address], [address] [place] [address] Dept: [phone] Loc: [phone]  Vincent Guerrero  05/31/2016  You are scheduled for a Cardiac Catheterization on Monday, April 30 with Dr. Verdis Prime.  1. Please arrive at the [facility] (Main Entrance A) at [facility]: [address] [place], [place] [address] at 10:00 AM (two hours before your procedure to ensure your preparation). Free valet parking service is available.   Special note: Every effort is made to have your procedure done on time. Please understand that emergencies sometimes delay scheduled procedures.  2. Diet: Do not eat or drink anything after midnight prior to your procedure except sips of water to take medications.  3. Labs: You  will have lab work done TODAY.  4. Medication instructions in preparation for your procedure:  MEDICATION INSTRUCTIONS: ONLY TAKE THE A.M. DOSE OF METFORMIN ON Sunday, 06/03/16 DO NOT TAKE THE METFORMIN OR GLIPIZIDE ON THE MORNING OF YOUR PROCEDURE  On the morning of your procedure, take your Aspirin and any morning medicines NOT listed above.  You may use sips of water.  5. Plan for one night stay--bring personal belongings. 6. Bring a current list of your medications and current insurance cards. 7. You MUST have a responsible person to drive you home. 8. Someone MUST be with you the first 24 hours after you arrive home or your discharge will be delayed. 9. Please wear clothes that are easy to get on and off and wear slip-on shoes.  Thank you for allowing [place] to care for you!   -- [place] [facility]    If you need a refill on your cardiac medications before your next appointment, please call your pharmacy.

## 2016-06-01 LAB — CBC
HEMATOCRIT: 39.6 % (ref 37.5–51.0)
HEMOGLOBIN: 12.5 g/dL — AB (ref 13.0–17.7)
MCH: 24.6 pg — AB (ref 26.6–33.0)
MCHC: 31.6 g/dL (ref 31.5–35.7)
MCV: 78 fL — AB (ref 79–97)
Platelets: 264 10*3/uL (ref 150–379)
RBC: 5.08 x10E6/uL (ref 4.14–5.80)
RDW: 17.6 % — ABNORMAL HIGH (ref 12.3–15.4)
WBC: 11.8 10*3/uL — ABNORMAL HIGH (ref 3.4–10.8)

## 2016-06-01 LAB — BASIC METABOLIC PANEL
BUN/Creatinine Ratio: 16 (ref 9–20)
BUN: 17 mg/dL (ref 6–24)
CALCIUM: 9.3 mg/dL (ref 8.7–10.2)
CO2: 20 mmol/L (ref 18–29)
CREATININE: 1.05 mg/dL (ref 0.76–1.27)
Chloride: 104 mmol/L (ref 96–106)
GFR, EST AFRICAN AMERICAN: 92 mL/min/{1.73_m2} (ref >59)
GFR, EST NON AFRICAN AMERICAN: 80 mL/min/{1.73_m2} (ref >59)
Glucose: 127 mg/dL — ABNORMAL HIGH (ref 65–99)
Potassium: 4.3 mmol/L (ref 3.5–5.2)
Sodium: 143 mmol/L (ref 134–144)

## 2016-06-01 LAB — PROTIME-INR
INR: 0.9 (ref 0.8–1.2)
Prothrombin Time: 10 s (ref 9.1–12.0)

## 2016-06-04 ENCOUNTER — Encounter (HOSPITAL_COMMUNITY)
Admission: RE | Disposition: A | Discharge status: Home / Self Care | Payer: Self-pay | Source: Ambulatory Visit | Attending: Interventional Cardiology

## 2016-06-04 ENCOUNTER — Ambulatory Visit (HOSPITAL_COMMUNITY)
Admission: RE | Admit: 2016-06-04 | Discharge: 2016-06-04 | Disposition: A | Discharge status: Home / Self Care | Payer: Self-pay | Source: Ambulatory Visit | Attending: Interventional Cardiology | Admitting: Interventional Cardiology

## 2016-06-04 DIAGNOSIS — I252 Old myocardial infarction: Secondary | ICD-10-CM | POA: Insufficient documentation

## 2016-06-04 DIAGNOSIS — Z8673 Personal history of transient ischemic attack (TIA), and cerebral infarction without residual deficits: Secondary | ICD-10-CM | POA: Insufficient documentation

## 2016-06-04 DIAGNOSIS — I25811 Atherosclerosis of native coronary artery of transplanted heart without angina pectoris: Secondary | ICD-10-CM

## 2016-06-04 DIAGNOSIS — I2582 Chronic total occlusion of coronary artery: Secondary | ICD-10-CM | POA: Insufficient documentation

## 2016-06-04 DIAGNOSIS — I350 Nonrheumatic aortic (valve) stenosis: Secondary | ICD-10-CM | POA: Insufficient documentation

## 2016-06-04 DIAGNOSIS — K4020 Bilateral inguinal hernia, without obstruction or gangrene, not specified as recurrent: Secondary | ICD-10-CM | POA: Insufficient documentation

## 2016-06-04 DIAGNOSIS — E119 Type 2 diabetes mellitus without complications: Secondary | ICD-10-CM | POA: Insufficient documentation

## 2016-06-04 DIAGNOSIS — Z955 Presence of coronary angioplasty implant and graft: Secondary | ICD-10-CM | POA: Insufficient documentation

## 2016-06-04 DIAGNOSIS — F329 Major depressive disorder, single episode, unspecified: Secondary | ICD-10-CM | POA: Insufficient documentation

## 2016-06-04 DIAGNOSIS — E785 Hyperlipidemia, unspecified: Secondary | ICD-10-CM | POA: Insufficient documentation

## 2016-06-04 DIAGNOSIS — I10 Essential (primary) hypertension: Secondary | ICD-10-CM | POA: Insufficient documentation

## 2016-06-04 DIAGNOSIS — Z7984 Long term (current) use of oral hypoglycemic drugs: Secondary | ICD-10-CM | POA: Insufficient documentation

## 2016-06-04 DIAGNOSIS — I2510 Atherosclerotic heart disease of native coronary artery without angina pectoris: Secondary | ICD-10-CM | POA: Insufficient documentation

## 2016-06-04 DIAGNOSIS — Z8249 Family history of ischemic heart disease and other diseases of the circulatory system: Secondary | ICD-10-CM | POA: Insufficient documentation

## 2016-06-04 DIAGNOSIS — F17210 Nicotine dependence, cigarettes, uncomplicated: Secondary | ICD-10-CM | POA: Insufficient documentation

## 2016-06-04 HISTORY — PX: LEFT HEART CATH AND CORONARY ANGIOGRAPHY: CATH118249

## 2016-06-04 LAB — GLUCOSE, CAPILLARY: Glucose-Capillary: 104 mg/dL — ABNORMAL HIGH (ref 65–99)

## 2016-06-04 SURGERY — LEFT HEART CATH AND CORONARY ANGIOGRAPHY
Anesthesia: LOCAL

## 2016-06-04 MED ORDER — VERAPAMIL HCL 2.5 MG/ML IV SOLN
INTRAVENOUS | Status: DC | PRN
  Administered 2016-06-04: 10 mL via INTRA_ARTERIAL

## 2016-06-04 MED ORDER — HEPARIN (PORCINE) IN NACL 2-0.9 UNIT/ML-% IJ SOLN
INTRAMUSCULAR | Status: DC | PRN
  Administered 2016-06-04: 1000 mL

## 2016-06-04 MED ORDER — IOPAMIDOL (ISOVUE-370) INJECTION 76%
INTRAVENOUS | Status: DC | PRN
  Administered 2016-06-04: 150 mL via INTRA_ARTERIAL

## 2016-06-04 MED ORDER — MIDAZOLAM HCL 2 MG/2ML IJ SOLN
INTRAMUSCULAR | Status: AC
  Filled 2016-06-04: qty 2

## 2016-06-04 MED ORDER — HEPARIN (PORCINE) IN NACL 2-0.9 UNIT/ML-% IJ SOLN
INTRAMUSCULAR | Status: AC
  Filled 2016-06-04: qty 1000

## 2016-06-04 MED ORDER — MIDAZOLAM HCL 2 MG/2ML IJ SOLN
INTRAMUSCULAR | Status: DC | PRN
  Administered 2016-06-04: 1 mg via INTRAVENOUS

## 2016-06-04 MED ORDER — IOPAMIDOL (ISOVUE-370) INJECTION 76%
INTRAVENOUS | Status: AC
  Filled 2016-06-04: qty 100

## 2016-06-04 MED ORDER — SODIUM CHLORIDE 0.9 % WEIGHT BASED INFUSION
3.0000 mL/kg/h | INTRAVENOUS | Status: AC
  Administered 2016-06-04: 3 mL/kg/h via INTRAVENOUS

## 2016-06-04 MED ORDER — SODIUM CHLORIDE 0.9 % IV SOLN: 250.0000 mL | INTRAVENOUS | Status: DC | PRN

## 2016-06-04 MED ORDER — SODIUM CHLORIDE 0.9% FLUSH: 3.0000 mL | INTRAVENOUS | Status: DC | PRN

## 2016-06-04 MED ORDER — ASPIRIN 81 MG PO CHEW
CHEWABLE_TABLET | ORAL | Status: AC
  Filled 2016-06-04: qty 1

## 2016-06-04 MED ORDER — LIDOCAINE HCL (PF) 1 % IJ SOLN
INTRAMUSCULAR | Status: AC
  Filled 2016-06-04: qty 30

## 2016-06-04 MED ORDER — ASPIRIN 81 MG PO CHEW
81.0000 mg | CHEWABLE_TABLET | ORAL | Status: AC
  Administered 2016-06-04: 81 mg via ORAL

## 2016-06-04 MED ORDER — FENTANYL CITRATE (PF) 100 MCG/2ML IJ SOLN
INTRAMUSCULAR | Status: AC
  Filled 2016-06-04: qty 2

## 2016-06-04 MED ORDER — HEPARIN SODIUM (PORCINE) 1000 UNIT/ML IJ SOLN
INTRAMUSCULAR | Status: DC | PRN
  Administered 2016-06-04: 5000 [IU] via INTRAVENOUS

## 2016-06-04 MED ORDER — VERAPAMIL HCL 2.5 MG/ML IV SOLN
INTRAVENOUS | Status: AC
  Filled 2016-06-04: qty 2

## 2016-06-04 MED ORDER — HEPARIN SODIUM (PORCINE) 1000 UNIT/ML IJ SOLN
INTRAMUSCULAR | Status: AC
  Filled 2016-06-04: qty 1

## 2016-06-04 MED ORDER — FENTANYL CITRATE (PF) 100 MCG/2ML IJ SOLN
INTRAMUSCULAR | Status: DC | PRN
  Administered 2016-06-04 (×2): 50 ug via INTRAVENOUS

## 2016-06-04 MED ORDER — LIDOCAINE HCL (PF) 1 % IJ SOLN
INTRAMUSCULAR | Status: DC | PRN
  Administered 2016-06-04: 2 mL via SUBCUTANEOUS

## 2016-06-04 MED ORDER — SODIUM CHLORIDE 0.9 % WEIGHT BASED INFUSION: 1.0000 mL/kg/h | INTRAVENOUS | Status: DC

## 2016-06-04 MED ORDER — SODIUM CHLORIDE 0.9% FLUSH: 3.0000 mL | Freq: Two times a day (BID) | INTRAVENOUS | Status: DC

## 2016-06-04 SURGICAL SUPPLY — 15 items
CATH INFINITI 5 FR JL3.5 (CATHETERS) ×2 IMPLANT
CATH INFINITI 5FR AL1 (CATHETERS) ×2 IMPLANT
CATH INFINITI JR4 5F (CATHETERS) ×2 IMPLANT
CATH LAUNCHER 5F RADR (CATHETERS) ×1 IMPLANT
CATHETER LAUNCHER 5F RADR (CATHETERS) ×2
COVER PRB 48X5XTLSCP FOLD TPE (BAG) ×1 IMPLANT
COVER PROBE 5X48 (BAG) ×2
DEVICE RAD COMP TR BAND LRG (VASCULAR PRODUCTS) ×2 IMPLANT
GLIDESHEATH SLEND A-KIT 6F 22G (SHEATH) ×2 IMPLANT
GUIDEWIRE INQWIRE 1.5J.035X260 (WIRE) ×1 IMPLANT
INQWIRE 1.5J .035X260CM (WIRE) ×2
KIT HEART LEFT (KITS) ×2 IMPLANT
PACK CARDIAC CATHETERIZATION (CUSTOM PROCEDURE TRAY) ×2 IMPLANT
TRANSDUCER W/STOPCOCK (MISCELLANEOUS) ×2 IMPLANT
TUBING CIL FLEX 10 FLL-RA (TUBING) ×2 IMPLANT

## 2016-06-04 NOTE — Progress Notes (Signed)
TRB removed at 1440 and dressed with occlusive dressing.  Site unremarkable and stable after 30 minutes at 1510.   Discharge instructions reviewed with pt and daughter and understanding voiced.

## 2016-06-04 NOTE — Discharge Instructions (Signed)
Radial Site Care °Refer to this sheet in the next few weeks. These instructions provide you with information about caring for yourself after your procedure. Your health care provider may also give you more specific instructions. Your treatment has been planned according to current medical practices, but problems sometimes occur. Call your health care provider if you have any problems or questions after your procedure. °What can I expect after the procedure? °After your procedure, it is typical to have the following: °· Bruising at the radial site that usually fades within 1-2 weeks. °· Blood collecting in the tissue (hematoma) that may be painful to the touch. It should usually decrease in size and tenderness within 1-2 weeks. °Follow these instructions at home: °· Take medicines only as directed by your health care provider. °· You may shower 24-48 hours after the procedure or as directed by your health care provider. Remove the bandage (dressing) and gently wash the site with plain soap and water. Pat the area dry with a clean towel. Do not rub the site, because this may cause bleeding. °· Do not take baths, swim, or use a hot tub until your health care provider approves. °· Check your insertion site every day for redness, swelling, or drainage. °· Do not apply powder or lotion to the site. °· Do not flex or bend the affected arm for 24 hours or as directed by your health care provider. °· Do not push or pull heavy objects with the affected arm for 24 hours or as directed by your health care provider. °· Do not lift over 10 lb (4.5 kg) for 5 days after your procedure or as directed by your health care provider. °· Ask your health care provider when it is okay to: °¨ Return to work or school. °¨ Resume usual physical activities or sports. °¨ Resume sexual activity. °· Do not drive home if you are discharged the same day as the procedure. Have someone else drive you. °· You may drive 24 hours after the procedure  unless otherwise instructed by your health care provider. °· Do not operate machinery or power tools for 24 hours after the procedure. °· If your procedure was done as an outpatient procedure, which means that you went home the same day as your procedure, a responsible adult should be with you for the first 24 hours after you arrive home. °· Keep all follow-up visits as directed by your health care provider. This is important. °Contact a health care provider if: °· You have a fever. °· You have chills. °· You have increased bleeding from the radial site. Hold pressure on the site. °Get help right away if: °· You have unusual pain at the radial site. °· You have redness, warmth, or swelling at the radial site. °· You have drainage (other than a small amount of blood on the dressing) from the radial site. °· The radial site is bleeding, and the bleeding does not stop after 30 minutes of holding steady pressure on the site. °· Your arm or hand becomes pale, cool, tingly, or numb. °This information is not intended to replace advice given to you by your health care provider. Make sure you discuss any questions you have with your health care provider. °Document Released: 02/24/2010 Document Revised: 06/30/2015 Document Reviewed: 08/10/2013 °Elsevier Interactive Patient Education © 2017 [employer] Inc[employer] ° °

## 2016-06-04 NOTE — Interval H&P Note (Signed)
History and Physical Interval Note:  06/04/2016 Cath Lab Visit (complete for each Cath Lab visit)  Clinical Evaluation Leading to the Procedure:   ACS: No.  Non-ACS:    Anginal Classification: CCS III  Anti-ischemic medical therapy: Minimal Therapy (1 class of medications)  Non-Invasive Test Results: High-risk stress test findings: cardiac mortality >3%/year  Prior CABG: No previous CABG       11:05 AM  [name]  has presented today for surgery, with the diagnosis of abnormal stress test  The various methods of treatment have been discussed with the patient and family. After consideration of risks, benefits and other options for treatment, the patient has consented to  Procedure(s): Left Heart Cath and Coronary Angiography (N/A) as a surgical intervention .  The patient's history has been reviewed, patient examined, no change in status, stable for surgery.  I have reviewed the patient's chart and labs.  Questions were answered to the patient's satisfaction.     Lyn Records III

## 2016-06-04 NOTE — H&P (View-Only) (Signed)
 Cardiology Office Note    Date:  05/31/2016   ID:  Vincent Guerrero, DOB 06/02/1960, MRN [id number]  PCP:  No PCP Per Patient  Cardiologist:  New  CC: follow up- discuss abnormal stress test and cath.   History of Present Illness:  Vincent Guerrero is a 55 y.o. male with a history of tobacco abuse, MI with stent placement in 2010 in [place], CVA (2011) in [place], DMT2, HTN, aortic stenosis and HLD that presents to clinic to discuss abnormal stress test.   He has a history of CAD with MI in 2010 in [place]. No records available. He followed with a cardiologist at [facility] for about 6 months but then lost to follow up. He does not take a daily baby Asipirin but takes BC powder at least twice a day for aches and pains. Does not take a statin but says he has his cholesterol levels are under pretty good control by eating brazil nuts (heard it from Dr. Oz). He did not tolerate statins in the past. He states that his diabetes is under control. Dr. Lee, his PCP, recently did a bunch of lab work and it was okay. None of this sent to me.    I saw him in clinic as a new patient on 05/11/16 for cardiology clearance prior to bilateral inguinal hernia repair. I heard a murmur on exam so I ordered an echo and also a stress test for pre op risk assessment. BP was elevated to 160/82 and I added amlodipine 5mg daily. 2D ECHO 05/29/16 showed normal EF with G2DD and mod AS/AI. Nuclear stress test on 05/29/16 showed prior inferior infarct and mild to moderate peri-infarct ischemia; prior anteroseptal/apical infarct with mild peri-infarct ischemia; EF 50 with global hypokinesis and mild LVE.  Today he presents to clinic for follow up. No CP or SOB. No LE edema, orthopnea or PND. He had had some dizziness but no syncope. Not related to any certain No blood in stool or urine. No palpitations. Still having groin pain related to hernia.   ADDENDUM: later he told my nurse that he is having some chest pain that he  had been ignoring.     Past Medical History:  Diagnosis Date  . Depression   . Diabetes mellitus (HCC)   . Hypertension   . Inguinal hernia without obstruction or gangrene    BILATERAL    Past Surgical History:  Procedure Laterality Date  . HERNIA REPAIR      Current Medications: Outpatient Medications Prior to Visit  Medication Sig Dispense Refill  . Aspirin-Caffeine 845-65 MG PACK Take 1 packet by mouth every 4 (four) hours as needed. BC     . glipiZIDE (GLUCOTROL) 10 MG tablet Take 10 mg by mouth 2 (two) times daily before a meal.     . lisinopril (PRINIVIL,ZESTRIL) 20 MG tablet Take 1 tablet (20 mg total) by mouth 2 (two) times daily. 180 tablet 2  . metFORMIN (GLUMETZA) 1000 MG (MOD) 24 hr tablet Take 1,000 mg by mouth 2 (two) times daily with a meal.     . metoprolol (LOPRESSOR) 50 MG tablet Take 1 tablet (50 mg total) by mouth 2 (two) times daily. 180 tablet 2  . amLODipine (NORVASC) 5 MG tablet Take 1 tablet (5 mg total) by mouth daily. 90 tablet 3   No facility-administered medications prior to visit.      Allergies:   Patient has no known allergies.   Social History   Social   History  . Marital status: Legally Separated    Spouse name: N/A  . Number of children: N/A  . Years of education: N/A   Social History Main Topics  . Smoking status: Current Every Day Smoker    Types: Cigarettes  . Smokeless tobacco: Current User    Types: Chew  . Alcohol use No  . Drug use: No  . Sexual activity: Not Asked   Other Topics Concern  . None   Social History Narrative  . None     Family History:  The patient's family history includes CAD in his father; Heart failure in his mother.      ROS:   Please see the history of present illness.    ROS All other systems reviewed and are negative.   PHYSICAL EXAM:   VS:  BP 138/68   Pulse 68   Ht 5' 10" (1.778 m)   Wt 217 lb 15.7 oz (98.9 kg)   SpO2 94%   BMI 31.28 kg/m    GEN: Well nourished, well developed, in  no acute distress  HEENT: normal  Neck: no JVD, carotid bruits, or masses Cardiac: RRR; 3/6 SEM @ RUSB, rubs, or gallops,no edema  Respiratory:  clear to auscultation bilaterally, normal work of breathing GI: soft, nontender, nondistended, + BS MS: no deformity or atrophy  Skin: warm and dry, no rash Neuro:  Alert and Oriented x 3, Strength and sensation are intact Psych: euthymic mood, full affect    Wt Readings from Last 3 Encounters:  05/31/16 217 lb 15.7 oz (98.9 kg)  05/29/16 220 lb (99.8 kg)  05/11/16 220 lb 12.8 oz (100.2 kg)      Studies/Labs Reviewed:   EKG:  EKG is NOT ordered today.   Recent Labs: No results found for requested labs within last 8760 hours.   Lipid Panel No results found for: CHOL, TRIG, HDL, CHOLHDL, VLDL, LDLCALC, LDLDIRECT  Additional studies/ records that were reviewed today include:  Myoview 05/29/16 Study Highlights    Nuclear stress EF: 50%.  There was no ST segment deviation noted during stress.  Defect 1: There is a large defect of severe severity present in the basal inferior and mid inferior location.  Defect 2: There is a large defect of moderate severity present in the mid anteroseptal and apex location.  This is a high risk study.  The left ventricular ejection fraction is mildly decreased (45-54%).   High risk stress nuclear study with prior inferior infarct and mild to moderate peri-infarct ischemia; prior anteroseptal/apical infarct with mild peri-infarct ischemia; EF 50 with global hypokinesis and mild LVE.     2D ECHO: 05/29/2016 LV EF: 60% -   65% Study Conclusions - Left ventricle: The cavity size was normal. Wall thickness was   normal. Systolic function was normal. The estimated ejection   fraction was in the range of 60% to 65%. Wall motion was normal;   there were no regional wall motion abnormalities. Features are   consistent with a pseudonormal left ventricular filling pattern,   with concomitant  abnormal relaxation and increased filling   pressure (grade 2 diastolic dysfunction). - Aortic valve: There was moderate stenosis. There was moderate   regurgitation. Valve area (VTI): 1.75 cm^2. Valve area (Vmax):   1.71 cm^2. Valve area (Vmean): 1.57 cm^2.   ASSESSMENT & PLAN:   Abnormal nuclear stress test: ordered for preop clearance prior to bilateral inguinal hernia repair. This was completed 05/29/16 and showed prior inferior infarct and mild   to moderate peri-infarct ischemia; prior anteroseptal/apical infarct with mild peri-infarct ischemia; EF 50 with global hypokinesis and mild LVE. He later told my nurse he was having some chest pain from time to time. I discussed case with Dr. Nasher (DOD) today and he agreed with cath. I have arranged this for 06/04/16 with Dr. Smith. Will get pre cath labs today.   I have reviewed the risks, indications, and alternatives to cardiac catheterization and possible angioplasty/stenting with the patient. Risks include but are not limited to bleeding, infection, vascular injury, stroke, myocardial infection, arrhythmia, kidney injury, radiation-related injury in the case of prolonged fluoroscopy use, emergency cardiac surgery, and death. The patient understands the risks of serious complication is low (<1%).   Mod AS/AI: continue to monitor   HTN: BP better controlled with the addition of amlodipine 5mg daily. Still mildly elevated at 138/68 (according to new guidelines) so I will increase amlodipine to 10mg daily. Continue Lopressor 50mg BID and Lisinorpil 20mg BID  CAD s/p previous stenting: no records available on this. He takes BC powder with 845mg of ASA in this every day. Continue BB. He is intolerant to statins. Will proceed with LHC as above given abnormal nuclear stress test.   DMT2: Labs faxed from PCP and HgA1c was 6.3 in 03/2016  HLD: most recent LDL was 123. Intolerant to statins. Will wait on results to LHC next Monday. If he has significant  progression of CAD, I will push for him to be seen in the lipid clinic.   Tobacco abuse: still smoking about 1PPD. Discussed the importance of cessation.   Medication Adjustments/Labs and Tests Ordered: Current medicines are reviewed at length with the patient today.  Concerns regarding medicines are outlined above.  Medication changes, Labs and Tests ordered today are listed in the Patient Instructions below. Patient Instructions  Medication Instructions:  Your physician has recommended you make the following change in your medication:  1.  INCREASE the Amlodipine to 10 mg taking 1 tablet daily  Labwork: TODAY:  BMET, CBC, & PT/INR  Testing/Procedures: Your physician has requested that you have a cardiac catheterization. Cardiac catheterization is used to diagnose and/or treat various heart conditions. Doctors may recommend this procedure for a number of different reasons. The most common reason is to evaluate chest pain. Chest pain can be a symptom of coronary artery disease (CAD), and cardiac catheterization can show whether plaque is narrowing or blocking your heart's arteries. This procedure is also used to evaluate the valves, as well as measure the blood flow and oxygen levels in different parts of your heart. For further information please visit www.cardiosmart.org. Please follow instruction sheet, as given.    Follow-Up: Your physician recommends that you schedule a follow-up appointment in: WILL BE SET UP AT DISCHARGE   Any Other Special Instructions Will Be Listed Below (If Applicable).    [facility] [address], [address] [place] [address] Dept: [phone] Loc: [phone]  Vincent Guerrero  05/31/2016  You are scheduled for a Cardiac Catheterization on Monday, April 30 with Dr. Henry Smith.  1. Please arrive at the North Tower (Main Entrance A) at  Hospital:  [address] [place], [place] [address] at 10:00 AM (two hours before your procedure to ensure your preparation). Free valet parking service is available.   Special note: Every effort is made to have your procedure done on time. Please understand that emergencies sometimes delay scheduled   procedures.  2. Diet: Do not eat or drink anything after midnight prior to your procedure except sips of water to take medications.  3. Labs: You will have lab work done TODAY.  4. Medication instructions in preparation for your procedure:  MEDICATION INSTRUCTIONS: ONLY TAKE THE A.M. DOSE OF METFORMIN ON Sunday, 06/03/16 DO NOT TAKE THE METFORMIN OR GLIPIZIDE ON THE MORNING OF YOUR PROCEDURE  On the morning of your procedure, take your Aspirin and any morning medicines NOT listed above.  You may use sips of water.  5. Plan for one night stay--bring personal belongings. 6. Bring a current list of your medications and current insurance cards. 7. You MUST have a responsible person to drive you home. 8. Someone MUST be with you the first 24 hours after you arrive home or your discharge will be delayed. 9. Please wear clothes that are easy to get on and off and wear slip-on shoes.  Thank you for allowing us to care for you!   -- [facility]    If you need a refill on your cardiac medications before your next appointment, please call your pharmacy.      Signed, Loudon Krakow, PA-C  05/31/2016 2:27 PM    [facility] [address], [place], [place]  [address] Phone: [phone]; Fax: [phone]    

## 2016-06-05 ENCOUNTER — Encounter (HOSPITAL_COMMUNITY): Payer: Self-pay | Admitting: Interventional Cardiology

## 2016-06-18 NOTE — Progress Notes (Deleted)
Cardiology Office Note    Date:  06/18/2016   ID:  Vincent Guerrero, DOB [birth year], MRN [id number]  PCP:  Patient, No Pcp Per  Cardiologist:  Dr. Katrinka BlazingSmith   CC:   History of Present Illness:  Vincent Guerrero is a 56 y.o. male with a history of  tobacco abuse, MI with stent placement in 2010 in [place], CVA (2011) in [place], DMT2, HTN, aortic stenosis and HLD that presents to clinic for follow up.   He has a history of CAD with MI in 2010 in [place]. No records available. He [facility] with a [facility]ologist [facility] for about 6 months but then lost to follow up. He does not take a daily baby Asipirin but takesBC powder at least twice a day for aches and pains. Does not take a statin but says he has his cholesterol levels are under pretty good control by eating Eston[place] nuts (heard it from Dr. Neil Crouchz). He did not tolerate statins in the past. He states that his diabetes is under control. Dr. Nedra HaiLee, his PCP, did blood work in 03/2016 which showed HgA1c 6.3, LDL 126, creat 0.91.  I saw him in clinic as a new patient on 05/11/16 for cardiology clearance prior to bilateral inguinal hernia repair. I heard a murmur on exam so I ordered an echo and also a stress test for pre op risk assessment. BP was elevated to 160/82 and I added amlodipine 5mg  daily. 2D ECHO 05/29/16 showed normal EF with G2DD and mod AS/AI. Nuclear stress test on 05/29/16 showed prior inferior infarct and mild to moderate peri-infarct ischemia; prior anteroseptal/apical infarct with mild peri-infarct ischemia; EF 50 with global hypokinesis and mild LVE. Given abnormal nuclear stress test, it was decided to proceed with cardiac cath. Dr. Katrinka BlazingSmith performed this on 06/04/16 which showed CTO of previously stented non dominant RCA, 30% distal left main, 40% pLAD diffuse narrowing, 60% ost-prox RI, normal LVEF with mildly elevated LVEDP ~3420mm, and minimal aortic valve peak to peak gradient ~18. He was cleared for umbilical herniorrhaphy.  Today  he presents to clinic for follow up.     Past Medical History:  Diagnosis Date  . Depression   . Diabetes mellitus (HCC)   . Hypertension   . Inguinal hernia without obstruction or gangrene    BILATERAL    Past Surgical History:  Procedure Laterality Date  . HERNIA REPAIR    . LEFT HEART CATH AND CORONARY ANGIOGRAPHY N/A 06/04/2016   Procedure: Left Heart Cath and Coronary Angiography;  Surgeon: Lyn RecordsHenry W Smith, MD;  [facility];  Service: Cardiovascular;  Laterality: N/A;    Current Medications: Outpatient Medications Prior to Visit  Medication Sig Dispense Refill  . amLODipine (NORVASC) 10 MG tablet Take 1 tablet (10 mg total) by mouth daily. 90 tablet 3  . amLODipine (NORVASC) 5 MG tablet Take 10 mg by mouth daily.    . Aspirin-Salicylamide-Caffeine (BC HEADACHE POWDER PO) Take 1 packet by mouth every 3 (three) hours as needed (pain).    Marland Kitchen. glipiZIDE (GLUCOTROL) 10 MG tablet Take 10 mg by mouth 2 (two) times daily before a meal.     . lisinopril (PRINIVIL,ZESTRIL) 20 MG tablet Take 1 tablet (20 mg total) by mouth 2 (two) times daily. 180 tablet 2  . metFORMIN (GLUCOPHAGE) 1000 MG tablet Take 1,000 mg by mouth 2 (two) times daily with a meal.    . metoprolol (LOPRESSOR) 50 MG tablet Take 1 tablet (50 mg total)  by mouth 2 (two) times daily. 180 tablet 2   No facility-administered medications prior to visit.      Allergies:   Patient has no known allergies.   Social History   Social History  . Marital status: Legally Separated    Spouse name: N/A  . Number of children: N/A  . Years of education: N/A   Social History Main Topics  . Smoking status: Current Every Day Smoker    Types: Cigarettes  . Smokeless tobacco: Current User    Types: Chew  . Alcohol use No  . Drug use: No  . Sexual activity: Not on file   Other Topics Concern  . Not on file   Social History Narrative  . No narrative on file     Family History:  The patient's family history  includes CAD in his father; Heart failure in his mother.      *** ROS/PE    Wt Readings from Last 3 Encounters:  06/04/16 218 lb (98.9 kg)  05/31/16 217 lb 15.7 oz (98.9 kg)  05/29/16 220 lb (99.8 kg)      Studies/Labs Reviewed:   EKG:  EKG is NOT ordered today.    Recent Labs: 05/31/2016: BUN 17; Creatinine, Ser 1.05; Platelets 264; Potassium 4.3; Sodium 143   Lipid Panel No results found for: CHOL, TRIG, HDL, CHOLHDL, VLDL, LDLCALC, LDLDIRECT  Additional studies/ records that were reviewed today include:  Myoview 05/29/16 Study Highlights    Nuclear stress EF: 50%.  There was no ST segment deviation noted during stress.  Defect 1: There is a large defect of severe severity present in the basal inferior and mid inferior location.  Defect 2: There is a large defect of moderate severity present in the mid anteroseptal and apex location.  This is a high risk study.  The left ventricular ejection fraction is mildly decreased (45-54%).  High risk stress nuclear study with prior inferior infarct and mild to moderate peri-infarct ischemia; prior anteroseptal/apical infarct with mild peri-infarct ischemia; EF 50 with global hypokinesis and mild LVE.     2D ECHO: 05/29/2016 LV EF: 60% - 65% Study Conclusions - Left ventricle: The cavity size was normal. Wall thickness was normal. Systolic function was normal. The estimated ejection fraction was in the range of 60% to 65%. Wall motion was normal; there were no regional wall motion abnormalities. Features are consistent with a pseudonormal left ventricular filling pattern, with concomitant abnormal relaxation and increased filling pressure (grade 2 diastolic dysfunction). - Aortic valve: There was moderate stenosis. There was moderate regurgitation. Valve area (VTI): 1.75 cm^2. Valve area (Vmax): 1.71 cm^2. Valve area (Vmean): 1.57 cm^2.   06/04/16 Left Heart Cath and Coronary Angiography    Conclusion    Total occlusion of nondominant right coronary artery which was previously stented. The occlusion is aorta ostial.  30% distal left main.  40% proximal LAD diffuse narrowing.  60% segmental narrowing ostium to proximal ramus intermedius.  Normal left ventricular size and function. LVEDP is mildly elevated at 20 mmHg.  Minimal aortic valve gradient peak to peak, 18 mmHg   RECOMMENDATIONS:  Aggressive risk factor modification including smoking cessation, lipid lowering, and blood pressure control.  Cleared for upcoming umbilical herniorrhaphy.     ASSESSMENT & PLAN:   CAD:  Mod AS/AI:  HTN:  HLD:  DMT2:  Tobacco abuse:  Medication Adjustments/Labs and Tests Ordered: Current medicines are reviewed at length with the patient today.  Concerns regarding medicines are outlined above.  Medication changes, Labs and Tests ordered today are listed in the Patient Instructions below. There are no Patient Instructions on file for this visit.   Signed, Cline Crock, PA-C  06/18/2016 8:34 AM    [facility] [address] [place], [place], [place]  [address] Phone: [phone]; Fax: [phone]

## 2016-06-19 ENCOUNTER — Ambulatory Visit: Payer: Self-pay | Admitting: Physician Assistant

## 2016-06-20 ENCOUNTER — Encounter: Payer: Self-pay | Admitting: Physician Assistant

## 2018-11-27 DIAGNOSIS — H2513 Age-related nuclear cataract, bilateral: Secondary | ICD-10-CM

## 2018-11-27 DIAGNOSIS — H401133 Primary open-angle glaucoma, bilateral, severe stage: Secondary | ICD-10-CM

## 2018-11-27 DIAGNOSIS — H401123 Primary open-angle glaucoma, left eye, severe stage: Secondary | ICD-10-CM

## 2018-11-27 DIAGNOSIS — H25013 Cortical age-related cataract, bilateral: Secondary | ICD-10-CM

## 2018-11-27 HISTORY — DX: Cortical age-related cataract, bilateral: H25.013

## 2018-11-27 HISTORY — DX: Primary open-angle glaucoma, bilateral, severe stage: H40.1133

## 2018-11-27 HISTORY — DX: Primary open-angle glaucoma, left eye, severe stage: H40.1123

## 2018-11-27 HISTORY — DX: Age-related nuclear cataract, bilateral: H25.13

## 2018-12-09 ENCOUNTER — Encounter: Payer: Self-pay | Admitting: Cardiology

## 2018-12-09 ENCOUNTER — Encounter: Payer: Self-pay | Admitting: *Deleted

## 2018-12-09 DIAGNOSIS — K4090 Unilateral inguinal hernia, without obstruction or gangrene, not specified as recurrent: Secondary | ICD-10-CM | POA: Insufficient documentation

## 2018-12-09 DIAGNOSIS — F329 Major depressive disorder, single episode, unspecified: Secondary | ICD-10-CM | POA: Insufficient documentation

## 2018-12-09 DIAGNOSIS — E119 Type 2 diabetes mellitus without complications: Secondary | ICD-10-CM | POA: Insufficient documentation

## 2018-12-09 DIAGNOSIS — F32A Depression, unspecified: Secondary | ICD-10-CM | POA: Insufficient documentation

## 2018-12-09 DIAGNOSIS — I10 Essential (primary) hypertension: Secondary | ICD-10-CM | POA: Insufficient documentation

## 2018-12-10 ENCOUNTER — Other Ambulatory Visit: Payer: Self-pay

## 2018-12-10 ENCOUNTER — Ambulatory Visit (INDEPENDENT_AMBULATORY_CARE_PROVIDER_SITE_OTHER): Payer: Self-pay | Admitting: Cardiology

## 2018-12-10 ENCOUNTER — Encounter: Payer: Self-pay | Admitting: Cardiology

## 2018-12-10 VITALS — BP 140/84 | HR 82 | Ht 71.0 in | Wt 197.0 lb

## 2018-12-10 DIAGNOSIS — R400 Somnolence: Secondary | ICD-10-CM

## 2018-12-10 DIAGNOSIS — I25811 Atherosclerosis of native coronary artery of transplanted heart without angina pectoris: Secondary | ICD-10-CM

## 2018-12-10 DIAGNOSIS — E785 Hyperlipidemia, unspecified: Secondary | ICD-10-CM

## 2018-12-10 DIAGNOSIS — R600 Localized edema: Secondary | ICD-10-CM

## 2018-12-10 DIAGNOSIS — Z01810 Encounter for preprocedural cardiovascular examination: Secondary | ICD-10-CM

## 2018-12-10 DIAGNOSIS — I350 Nonrheumatic aortic (valve) stenosis: Secondary | ICD-10-CM

## 2018-12-10 DIAGNOSIS — I10 Essential (primary) hypertension: Secondary | ICD-10-CM

## 2018-12-10 DIAGNOSIS — R5383 Other fatigue: Secondary | ICD-10-CM

## 2018-12-10 MED ORDER — FUROSEMIDE 20 MG PO TABS: ORAL_TABLET | ORAL | 1 refills | Status: DC

## 2018-12-10 MED ORDER — POTASSIUM CHLORIDE CRYS ER 20 MEQ PO TBCR: EXTENDED_RELEASE_TABLET | ORAL | 1 refills | Status: DC

## 2018-12-10 NOTE — Patient Instructions (Signed)
Medication Instructions:  Your physician has recommended you make the following change in your medication:   START: Furosemide 20 mg Take 1 tab on Tues, Thurs, and Saturdays START: Potassium 20 meq  Take 1 tab on Tues,Thurs, and Saturdays  *If you need a refill on your cardiac medications before your next appointment, please call your pharmacy*  Lab Work: Your physician recommends that you return for lab work in: TODAY BMP,Magnesium,TSH,Lipid,Lipoprotein A    If you have labs (blood work) drawn today and your tests are completely normal, you will receive your results only by: Marland Kitchen MyChart Message (if you have MyChart) OR . A paper copy in the mail If you have any lab test that is abnormal or we need to change your treatment, we will call you to review the results.  Testing/Procedures: Your physician has requested that you have an echocardiogram. Echocardiography is a painless test that uses sound waves to create images of your heart. It provides your doctor with information about the size and shape of your heart and how well your heart's chambers and valves are working. This procedure takes approximately one hour. There are no restrictions for this procedure.    Follow-Up: At [facility], you and your health needs are our priority.  As part of our continuing mission to provide you with exceptional heart care, we have created designated Provider Care Teams.  These Care Teams include your primary Cardiologist (physician) and Advanced Practice Providers (APPs -  Physician Assistants and Nurse Practitioners) who all work together to provide you with the care you need, when you need it.  Your next appointment:   3 months  The format for your next appointment:   In Person  Provider:   Thomasene Ripple, DO  Other Instructions  Echocardiogram An echocardiogram is a procedure that uses painless sound waves (ultrasound) to produce an image of the heart. Images from an echocardiogram can  provide important information about:  Signs of coronary artery disease (CAD).  Aneurysm detection. An aneurysm is a weak or damaged part of an artery wall that bulges out from the normal force of blood pumping through the body.  Heart size and shape. Changes in the size or shape of the heart can be associated with certain conditions, including heart failure, aneurysm, and CAD.  Heart muscle function.  Heart valve function.  Signs of a past heart attack.  Fluid buildup around the heart.  Thickening of the heart muscle.  A tumor or infectious growth around the heart valves. Tell a health care provider about:  Any allergies you have.  All medicines you are taking, including vitamins, herbs, eye drops, creams, and over-the-counter medicines.  Any blood disorders you have.  Any surgeries you have had.  Any medical conditions you have.  Whether you are pregnant or may be pregnant. What are the risks? Generally, this is a safe procedure. However, problems may occur, including:  Allergic reaction to dye (contrast) that may be used during the procedure. What happens before the procedure? No specific preparation is needed. You may eat and drink normally. What happens during the procedure?   An IV tube may be inserted into one of your veins.  You may receive contrast through this tube. A contrast is an injection that improves the quality of the pictures from your heart.  A gel will be applied to your chest.  A wand-like tool (transducer) will be moved over your chest. The gel will help to transmit the sound waves from the  transducer.  The sound waves will harmlessly bounce off of your heart to allow the heart images to be captured in real-time motion. The images will be recorded on a computer. The procedure may vary among health care providers and hospitals. What happens after the procedure?  You may return to your normal, everyday life, including diet, activities, and  medicines, unless your health care provider tells you not to do that. Summary  An echocardiogram is a procedure that uses painless sound waves (ultrasound) to produce an image of the heart.  Images from an echocardiogram can provide important information about the size and shape of your heart, heart muscle function, heart valve function, and fluid buildup around your heart.  You do not need to do anything to prepare before this procedure. You may eat and drink normally.  After the echocardiogram is completed, you may return to your normal, everyday life, unless your health care provider tells you not to do that. This information is not intended to replace advice given to you by your health care provider. Make sure you discuss any questions you have with your health care provider. Document Released: 01/20/2000 Document Revised: 05/15/2018 Document Reviewed: 02/25/2016 Elsevier Patient Education  2020 [employer].

## 2018-12-10 NOTE — Progress Notes (Signed)
Cardiology Office Note:    Date:  12/10/2018   ID:  Vincent FlightChristopher B Clarkston, DOB 1960/09/02, MRN [id number]  PCP:  Simone CuriaLee, Keung, MD  Cardiologist:  No primary care provider on file.  Electrophysiologist:  None   Referring MD: Simone CuriaLee, Keung, MD   Chief Complaint  Patient presents with  . Pre-op Exam    eye surgery    History of Present Illness:    Vincent Guerrero is a 58 y.o. male with a hx of coronary artery disease status post stent in the RCA a result of an MI in 2010, hyperlipidemia (not on statins due to statin intolerance), diabetes mellitus (on Metformin and glipizide), CVA in 2011, moderate aortic stenosis in April 2018, presents today for preoperative clearance for cataract surgery.  Per records the patient did have his MI in [place].  He did follow with a cardiologist in [place] for about 6 months and then lost to follow-up.he was seen by one of our partners in April 2018 to be cleared for bilateral inguinal hernia repair at which time he had a positive stress test which led to a cardiac catheterization he did have coronary disease which did not require no intervention needed.  He was subsequently cleared for his procedure.  Of note he does not take a daily baby Asipirin but takes BC powder at least twice a day for aches and pains. Does not take a statin due to statin intolerance and states he has had headaches, stomach pains and joint pain on any statin he has been tried in the past.    Today he is here for preoperative clearance for cataract surgery.  The patient tells me that he has experiencing generalized fatigue with daytime sleepiness over the last several months.  In addition he has experienced intermittent chest tightness which he notes that he has not had an episode for over 2 months now.  He denies any shortness of breath, lightheadedness or dizziness.   Past Medical History:  Diagnosis Date  . Age-related nuclear cataract of both eyes 11/27/2018  .  Anemia   . Bilateral recurrent inguinal hernia without obstruction or gangrene 05/09/2016   Added automatically from request for surgery [id number]  . Coronary artery disease involving native artery of transplanted heart without angina pectoris   . Cortical age-related cataract of both eyes 11/27/2018  . Depression   . Diabetes mellitus (HCC)   . Hyperlipidemia LDL goal <70   . Hypertension   . Inguinal hernia without obstruction or gangrene    BILATERAL  . Primary open angle glaucoma (POAG) of both eyes, severe stage 11/27/2018    Past Surgical History:  Procedure Laterality Date  . HERNIA REPAIR  1998  . LEFT HEART CATH AND CORONARY ANGIOGRAPHY N/A 06/04/2016   Procedure: Left Heart Cath and Coronary Angiography;  Surgeon: Lyn RecordsHenry W Smith, MD;  [facility];  Service: Cardiovascular;  Laterality: N/A;    Current Medications: Current Meds  Medication Sig  . Aspirin-Salicylamide-Caffeine (BC HEADACHE POWDER PO) Take by mouth 3 (three) times daily.  . brimonidine (ALPHAGAN) 0.2 % ophthalmic solution Apply to eye.  Marland Kitchen. glipiZIDE (GLUCOTROL) 10 MG tablet Take 10 mg by mouth 2 (two) times daily before a meal.   . lisinopril (PRINIVIL,ZESTRIL) 20 MG tablet Take 1 tablet (20 mg total) by mouth 2 (two) times daily.  . metFORMIN (GLUCOPHAGE) 1000 MG tablet Take 1,000 mg by mouth 2 (two) times daily with a meal.  . metoprolol (LOPRESSOR) 50 MG  tablet Take 1 tablet (50 mg total) by mouth 2 (two) times daily.  Marland Kitchen moxifloxacin (VIGAMOX) 0.5 % ophthalmic solution Insert one drop into the surgical eye 3 times a day starting 4 days prior to surgery.  . Netarsudil-Latanoprost (ROCKLATAN) 0.02-0.005 % SOLN Apply to eye.  . traMADol (ULTRAM) 50 MG tablet Take 50 mg by mouth 3 (three) times daily as needed.     Allergies:   Patient has no known allergies.   Social History   Socioeconomic History  . Marital status: Legally Separated    Spouse name: Not on file  . Number of children: Not on  file  . Years of education: Not on file  . Highest education level: Not on file  Occupational History  . Not on file  Social Needs  . Financial resource strain: Not on file  . Food insecurity    Worry: Not on file    Inability: Not on file  . Transportation needs    Medical: Not on file    Non-medical: Not on file  Tobacco Use  . Smoking status: Current Every Day Smoker    Types: Cigarettes  . Smokeless tobacco: Current User    Types: Chew  Substance and Sexual Activity  . Alcohol use: No  . Drug use: Yes    Types: Marijuana  . Sexual activity: Yes  Lifestyle  . Physical activity    Days per week: Not on file    Minutes per session: Not on file  . Stress: Not on file  Relationships  . Social Musician on phone: Not on file    Gets together: Not on file    Attends religious service: Not on file    Active member of club or organization: Not on file    Attends meetings of clubs or organizations: Not on file    Relationship status: Not on file  Other Topics Concern  . Not on file  Social History Narrative  . Not on file     Family History: The patient's family history includes CAD in his father; Diabetes in his father; Heart failure in his mother; Hypertension in his mother; Prostate cancer in his brother.  ROS:   Review of Systems  Constitution: Reports neurolyse fatigue and daytime sleepiness.  Negative for decreased appetite, fever and weight gain.  HENT: Negative for congestion, ear discharge, hoarse voice and sore throat.   Eyes: Negative for discharge, redness, vision loss in right eye and visual halos.  Cardiovascular: Reports intermittent chest pain.  Negative for dyspnea on exertion, leg swelling, orthopnea and palpitations.  Respiratory: Negative for cough, hemoptysis, shortness of breath and snoring.   Endocrine: Negative for heat intolerance and polyphagia.  Hematologic/Lymphatic: Negative for bleeding problem. Does not bruise/bleed easily.   Skin: Negative for flushing, nail changes, rash and suspicious lesions.  Musculoskeletal: Negative for arthritis, joint pain, muscle cramps, myalgias, neck pain and stiffness.  Gastrointestinal: Negative for abdominal pain, bowel incontinence, diarrhea and excessive appetite.  Genitourinary: Negative for decreased libido, genital sores and incomplete emptying.  Neurological: Negative for brief paralysis, focal weakness, headaches and loss of balance.  Psychiatric/Behavioral: Negative for altered mental status, depression and suicidal ideas.  Allergic/Immunologic: Negative for HIV exposure and persistent infections.    EKGs/Labs/Other Studies Reviewed:    The following studies were reviewed today:   EKG:  The ekg ordered today demonstrates sinus rhythm, heart rate 67 bpm LVH by Cornell criteria.  Transthoracic echocardiogram Study Conclusions: May 29, 2016 -  Left ventricle: The cavity size was normal. Wall thickness was   normal. Systolic function was normal. The estimated ejection   fraction was in the range of 60% to 65%. Wall motion was normal;   there were no regional wall motion abnormalities. Features are   consistent with a pseudonormal left ventricular filling pattern,   with concomitant abnormal relaxation and increased filling   pressure (grade 2 diastolic dysfunction). - Aortic valve: There was moderate stenosis. There was moderate   regurgitation. Valve area (VTI): 1.75 cm^2. Valve area (Vmax):   1.71 cm^2. Valve area (Vmean): 1.57 cm^2.   Left heart cath June 04, 2016 Conclusion  Total occlusion of nondominant right coronary artery which was previously stented. The occlusion is aorta ostial.  30% distal left main.  40% proximal LAD diffuse narrowing.  60% segmental narrowing ostium to proximal ramus intermedius.  Normal left ventricular size and function. LVEDP is mildly elevated at 20 mmHg.  Minimal aortic valve gradient peak to peak, 18 mmHg   RECOMMENDATIONS:   Aggressive risk factor modification including smoking cessation, lipid lowering, and blood pressure control.  Cleared for upcoming umbilical herniorrhaphy.     Recent Labs: No results found for requested labs within last 8760 hours.  Recent Lipid Panel No results found for: CHOL, TRIG, HDL, CHOLHDL, VLDL, LDLCALC, LDLDIRECT  Physical Exam:    VS:  BP (!) 170/84 (BP Location: Right Arm, Patient Position: Sitting, Cuff Size: Normal)   Pulse 82   Ht  (1.803 m)   Wt 197 lb (89.4 kg)   SpO2 97%   BMI 27.48 kg/m     Wt Readings from Last 3 Encounters:  12/10/18 197 lb (89.4 kg)  12/05/18 198 lb (89.8 kg)  06/04/16 218 lb (98.9 kg)     GEN: Well nourished, well developed in no acute distress HEENT: Normal NECK: No JVD; No carotid bruits LYMPHATICS: No lymphadenopathy CARDIAC: S1S2 noted,RRR, 2/6 mid ejection systolic murmur, rubs, gallops RESPIRATORY:  Clear to auscultation without rales, wheezing or rhonchi  ABDOMEN: Soft, non-tender, non-distended, +bowel sounds, no guarding. EXTREMITIES: Bilateral lower extremity was 1 edema, No cyanosis, no clubbing MUSCULOSKELETAL: No deformity  SKIN: Warm and dry NEUROLOGIC:  Alert and oriented x 3, non-focal PSYCHIATRIC:  Normal affect, good insight  ASSESSMENT:    1. Coronary artery disease involving native artery of transplanted heart without angina pectoris   2. Hypertension, unspecified type   3. Hyperlipidemia LDL goal <70   4. Moderate aortic stenosis   5. Daytime sleepiness   6. Fatigue, unspecified type   7. Bilateral leg edema   8. Preoperative cardiovascular examination    PLAN:    1.  Preoperative clearance for cataract surgery-the patient can proceed with his cataract surgery he denies any symptoms of angina.  He can do greater than 4 METS therefore at this time he does not need any other testing.  Please continue close hemodynamic monitoring during his surgery.  2.  Coronary artery  disease-he does not take aspirin 81 mg daily, but he does take BC powder (which includes aspirin-Salicylamide-caffeine twice daily).*Is not on a statin he has not tried Zetia however does not want to start any medication.  Therefore I am going to do a lipid profile to get updated information on his LDL as his target should be less than 70.  In addition LP(a) will be done as well.  3.  He has bilateral leg edema in the office today therefore I will cautious diuretics has been ordered  we will continue to monitor him for this.  4.  History of moderate stenosis-last echocardiogram in April 2014 this time will be to repeat an echocardiogram to assess the progression of his aortic stenosis.  5.  In terms of her daytime sleepiness and fatigue I suspect that he may have obstructive sleep apnea.  Discussed with patient that he needs to speak with his PCP Dr. Nedra Hai for moving forward with a sleep study.  6.  Hypertension we will continue patient his lisinopril, with adding Lasix cautiously to his medication regimen which also help with his blood pressure control.  For his target which is less than 130/80 mmHg.  7.  Diabetes per his primary care doctor.  The patient is in agreement with the above plan. The patient left the office in stable condition.  The patient will follow up in 3 months   Medication Adjustments/Labs and Tests Ordered: Current medicines are reviewed at length with the patient today.  Concerns regarding medicines are outlined above.  Orders Placed This Encounter  Procedures  . Basic Metabolic Panel (BMET)  . Magnesium  . TSH  . Lipid Profile  . Lipoprotein A (LPA)  . EKG 12-Lead  . ECHOCARDIOGRAM COMPLETE   Meds ordered this encounter  Medications  . furosemide (LASIX) 20 MG tablet    Sig: Take 1 tab (20 mg) on Tues,Thurs,and Saturday    Dispense:  45 tablet    Refill:  1  . potassium chloride SA (KLOR-CON M20) 20 MEQ tablet    Sig: Take 1 tab (20 meq) on Tues,Thurs,and  Saturdays    Dispense:  45 tablet    Refill:  1    Patient Instructions  Medication Instructions:  Your physician has recommended you make the following change in your medication:   START: Furosemide 20 mg Take 1 tab on Tues, Thurs, and Saturdays START: Potassium 20 meq  Take 1 tab on Tues,Thurs, and Saturdays  *If you need a refill on your cardiac medications before your next appointment, please call your pharmacy*  Lab Work: Your physician recommends that you return for lab work in: TODAY BMP,Magnesium,TSH,Lipid,Lipoprotein A    If you have labs (blood work) drawn today and your tests are completely normal, you will receive your results only by: Marland Kitchen MyChart Message (if you have MyChart) OR . A paper copy in the mail If you have any lab test that is abnormal or we need to change your treatment, we will call you to review the results.  Testing/Procedures: Your physician has requested that you have an echocardiogram. Echocardiography is a painless test that uses sound waves to create images of your heart. It provides your doctor with information about the size and shape of your heart and how well your heart's chambers and valves are working. This procedure takes approximately one hour. There are no restrictions for this procedure.    Follow-Up: At [facility], you and your health needs are our priority.  As part of our continuing mission to provide you with exceptional heart care, we have created designated Provider Care Teams.  These Care Teams include your primary Cardiologist (physician) and Advanced Practice Providers (APPs -  Physician Assistants and Nurse Practitioners) who all work together to provide you with the care you need, when you need it.  Your next appointment:   3 months  The format for your next appointment:   In Person  Provider:   Thomasene Ripple, DO  Other Instructions  Echocardiogram An echocardiogram is a procedure  that uses painless sound waves  (ultrasound) to produce an image of the heart. Images from an echocardiogram can provide important information about:  Signs of coronary artery disease (CAD).  Aneurysm detection. An aneurysm is a weak or damaged part of an artery wall that bulges out from the normal force of blood pumping through the body.  Heart size and shape. Changes in the size or shape of the heart can be associated with certain conditions, including heart failure, aneurysm, and CAD.  Heart muscle function.  Heart valve function.  Signs of a past heart attack.  Fluid buildup around the heart.  Thickening of the heart muscle.  A tumor or infectious growth around the heart valves. Tell a health care provider about:  Any allergies you have.  All medicines you are taking, including vitamins, herbs, eye drops, creams, and over-the-counter medicines.  Any blood disorders you have.  Any surgeries you have had.  Any medical conditions you have.  Whether you are pregnant or may be pregnant. What are the risks? Generally, this is a safe procedure. However, problems may occur, including:  Allergic reaction to dye (contrast) that may be used during the procedure. What happens before the procedure? No specific preparation is needed. You may eat and drink normally. What happens during the procedure?   An IV tube may be inserted into one of your veins.  You may receive contrast through this tube. A contrast is an injection that improves the quality of the pictures from your heart.  A gel will be applied to your chest.  A wand-like tool (transducer) will be moved over your chest. The gel will help to transmit the sound waves from the transducer.  The sound waves will harmlessly bounce off of your heart to allow the heart images to be captured in real-time motion. The images will be recorded on a computer. The procedure may vary among health care providers and hospitals. What happens after the procedure?   You may return to your normal, everyday life, including diet, activities, and medicines, unless your health care provider tells you not to do that. Summary  An echocardiogram is a procedure that uses painless sound waves (ultrasound) to produce an image of the heart.  Images from an echocardiogram can provide important information about the size and shape of your heart, heart muscle function, heart valve function, and fluid buildup around your heart.  You do not need to do anything to prepare before this procedure. You may eat and drink normally.  After the echocardiogram is completed, you may return to your normal, everyday life, unless your health care provider tells you not to do that. This information is not intended to replace advice given to you by your health care provider. Make sure you discuss any questions you have with your health care provider. Document Released: 01/20/2000 Document Revised: 05/15/2018 Document Reviewed: 02/25/2016 Elsevier Patient Education  2020 [employer].     Adopting a Healthy Lifestyle.  Know what a healthy weight is for you (roughly BMI <25) and aim to maintain this   Aim for 7+ servings of fruits and vegetables daily   65-80+ fluid ounces of water or unsweet tea for healthy kidneys   Limit to max 1 drink of alcohol per day; avoid smoking/tobacco   Limit animal fats in diet for cholesterol and heart health - choose grass fed whenever available   Avoid highly processed foods, and foods high in saturated/trans fats   Aim for low stress - take  time to unwind and care for your mental health   Aim for 150 min of moderate intensity exercise weekly for heart health, and weights twice weekly for bone health   Aim for 7-9 hours of sleep daily   When it comes to diets, agreement about the perfect plan isnt easy to find, even among the experts. Experts at the [facility] [employer] developed an idea known as the Healthy Eating Plate. Just  imagine a plate divided into logical, healthy portions.   The emphasis is on diet quality:   Load up on vegetables and fruits - one-half of your plate: Aim for color and variety, and remember that potatoes dont count.   Go for whole grains - one-quarter of your plate: Whole wheat, barley, wheat berries, quinoa, oats, brown rice, and foods made with them. If you want pasta, go with whole wheat pasta.   Protein power - one-quarter of your plate: Fish, chicken, beans, and nuts are all healthy, versatile protein sources. Limit red meat.   The diet, however, does go beyond the plate, offering a few other suggestions.   Use healthy plant oils, such as olive, canola, soy, corn, sunflower and peanut. Check the labels, and avoid partially hydrogenated oil, which have unhealthy trans fats.   If youre thirsty, drink water. Coffee and tea are good in moderation, but skip sugary drinks and limit milk and dairy products to one or two daily servings.   The type of carbohydrate in the diet is more important than the amount. Some sources of carbohydrates, such as vegetables, fruits, whole grains, and beans-are healthier than others.   Finally, stay active  Signed, Thomasene Ripple, DO  12/10/2018 11:59 AM    [facility]

## 2018-12-11 LAB — BASIC METABOLIC PANEL
BUN/Creatinine Ratio: 24 — ABNORMAL HIGH (ref 9–20)
BUN: 20 mg/dL (ref 6–24)
CO2: 21 mmol/L (ref 20–29)
Calcium: 9.1 mg/dL (ref 8.7–10.2)
Chloride: 105 mmol/L (ref 96–106)
Creatinine, Ser: 0.83 mg/dL (ref 0.76–1.27)
GFR calc Af Amer: 112 mL/min/{1.73_m2} (ref >59)
GFR calc non Af Amer: 97 mL/min/{1.73_m2} (ref >59)
Glucose: 136 mg/dL — ABNORMAL HIGH (ref 65–99)
Potassium: 4.8 mmol/L (ref 3.5–5.2)
Sodium: 141 mmol/L (ref 134–144)

## 2018-12-11 LAB — MAGNESIUM: Magnesium: 1.8 mg/dL (ref 1.6–2.3)

## 2018-12-11 LAB — LIPID PANEL
Chol/HDL Ratio: 4 ratio (ref 0.0–5.0)
Cholesterol, Total: 172 mg/dL (ref 100–199)
HDL: 43 mg/dL (ref >39)
LDL Chol Calc (NIH): 100 mg/dL — ABNORMAL HIGH (ref 0–99)
Triglycerides: 165 mg/dL — ABNORMAL HIGH (ref 0–149)
VLDL Cholesterol Cal: 29 mg/dL (ref 5–40)

## 2018-12-11 LAB — TSH: TSH: 0.585 u[IU]/mL (ref 0.450–4.500)

## 2018-12-11 LAB — LIPOPROTEIN A (LPA): Lipoprotein (a): 128.9 nmol/L — ABNORMAL HIGH (ref <75.0)

## 2018-12-24 ENCOUNTER — Telehealth: Payer: Self-pay

## 2018-12-24 DIAGNOSIS — I25811 Atherosclerosis of native coronary artery of transplanted heart without angina pectoris: Secondary | ICD-10-CM

## 2018-12-24 DIAGNOSIS — R600 Localized edema: Secondary | ICD-10-CM

## 2018-12-24 DIAGNOSIS — I10 Essential (primary) hypertension: Secondary | ICD-10-CM

## 2018-12-24 NOTE — Telephone Encounter (Signed)
Patient called and given lab results. Informed that Dr. Harriet Masson would like him to decrease his dietary carbohydrate intake and have labs repeated in 8-10 weeks prior to his follow-up visit in [facility]. Informed that once he gets f/u visit date, he should come to office between 8-12 and 1-5 1 week prior to visit. Verrbal understanding received.

## 2019-01-27 ENCOUNTER — Other Ambulatory Visit: Payer: Self-pay

## 2019-01-28 ENCOUNTER — Other Ambulatory Visit: Payer: Self-pay

## 2019-09-10 ENCOUNTER — Ambulatory Visit: Payer: Self-pay | Admitting: Cardiology

## 2019-09-10 ENCOUNTER — Other Ambulatory Visit: Payer: Self-pay

## 2019-09-30 ENCOUNTER — Other Ambulatory Visit: Payer: Self-pay

## 2019-10-05 ENCOUNTER — Ambulatory Visit: Payer: Self-pay | Admitting: Cardiology

## 2019-10-14 ENCOUNTER — Ambulatory Visit (INDEPENDENT_AMBULATORY_CARE_PROVIDER_SITE_OTHER): Payer: Self-pay

## 2019-10-14 ENCOUNTER — Other Ambulatory Visit: Payer: Self-pay

## 2019-10-14 ENCOUNTER — Other Ambulatory Visit: Payer: Self-pay | Admitting: Cardiology

## 2019-10-14 DIAGNOSIS — I10 Essential (primary) hypertension: Secondary | ICD-10-CM

## 2019-10-14 NOTE — Progress Notes (Signed)
Complete echocardiogram performed.  Jimmy Hewitt Garner RDCS, RVT  

## 2019-10-19 LAB — ECHOCARDIOGRAM COMPLETE
AR max vel: 1.19 cm2
AV Area VTI: 1.27 cm2
AV Area mean vel: 1.13 cm2
AV Mean grad: 38 mmHg
AV Peak grad: 70.6 mmHg
Ao pk vel: 4.2 m/s
Area-P 1/2: 3.12 cm2
P 1/2 time: 436 msec
S' Lateral: 3.6 cm

## 2019-10-20 ENCOUNTER — Other Ambulatory Visit: Payer: Self-pay

## 2019-10-20 ENCOUNTER — Telehealth: Payer: Self-pay

## 2019-10-20 DIAGNOSIS — D649 Anemia, unspecified: Secondary | ICD-10-CM | POA: Insufficient documentation

## 2019-10-20 NOTE — Telephone Encounter (Signed)
-----   Message from Thomasene Ripple, DO sent at 10/20/2019 10:54 AM EDT -----  Ejection fraction is normal, you have moderate aortic stenosis and a heart wall is moderately thickened as well.  Right now  no further testing is recommended continue medical management and will discuss at next office visit the details

## 2019-10-20 NOTE — Telephone Encounter (Signed)
Spoke with patient regarding results and recommendation.  Patient verbalizes understanding and is agreeable to plan of care. Advised patient to call back with any issues or concerns.  

## 2019-10-21 ENCOUNTER — Ambulatory Visit: Payer: BLUE CROSS/BLUE SHIELD | Admitting: Cardiology

## 2019-10-21 ENCOUNTER — Encounter: Payer: Self-pay | Admitting: Cardiology

## 2019-10-21 ENCOUNTER — Other Ambulatory Visit: Payer: Self-pay

## 2019-10-21 VITALS — BP 120/80 | HR 72 | Ht 71.0 in | Wt 208.0 lb

## 2019-10-21 DIAGNOSIS — I10 Essential (primary) hypertension: Secondary | ICD-10-CM | POA: Diagnosis not present

## 2019-10-21 DIAGNOSIS — I25811 Atherosclerosis of native coronary artery of transplanted heart without angina pectoris: Secondary | ICD-10-CM | POA: Diagnosis not present

## 2019-10-21 DIAGNOSIS — E0800 Diabetes mellitus due to underlying condition with hyperosmolarity without nonketotic hyperglycemic-hyperosmolar coma (NKHHC): Secondary | ICD-10-CM

## 2019-10-21 DIAGNOSIS — E785 Hyperlipidemia, unspecified: Secondary | ICD-10-CM

## 2019-10-21 MED ORDER — ISOSORBIDE MONONITRATE ER 30 MG PO TB24: 30.0000 mg | ORAL_TABLET | Freq: Every day | ORAL | 1 refills | Status: DC

## 2019-10-21 NOTE — Patient Instructions (Signed)
Medication Instructions:  Your physician has recommended you make the following change in your medication:   START: imdur 30 mg daily   *If you need a refill on your cardiac medications before your next appointment, please call your pharmacy*   Lab Work: None If you have labs (blood work) drawn today and your tests are completely normal, you will receive your results only by: Marland Kitchen MyChart Message (if you have MyChart) OR . A paper copy in the mail If you have any lab test that is abnormal or we need to change your treatment, we will call you to review the results.   Testing/Procedures: none   Follow-Up: At [facility], you and your health needs are our priority.  As part of our continuing mission to provide you with exceptional heart care, we have created designated Provider Care Teams.  These Care Teams include your primary Cardiologist (physician) and Advanced Practice Providers (APPs -  Physician Assistants and Nurse Practitioners) who all work together to provide you with the care you need, when you need it.  We recommend signing up for the patient portal called "MyChart".  Sign up information is provided on this After Visit Summary.  MyChart is used to connect with patients for Virtual Visits (Telemedicine).  Patients are able to view lab/test results, encounter notes, upcoming appointments, etc.  Non-urgent messages can be sent to your provider as well.   To learn more about what you can do with MyChart, go to [email or web address].    Your next appointment:   1 month(s)  The format for your next appointment:   In Person  Provider:   Thomasene Ripple, DO   Other Instructions  Isosorbide Mononitrate extended-release tablets What is this medicine? ISOSORBIDE MONONITRATE (eye soe SOR bide mon oh NYE trate) is a vasodilator. It relaxes blood vessels, increasing the blood and oxygen supply to your heart. This medicine is used to prevent chest pain caused by angina. It will not  help to stop an episode of chest pain. This medicine may be used for other purposes; ask your health care provider or pharmacist if you have questions. COMMON BRAND NAME(S): Imdur, Isotrate ER What should I tell my health care provider before I take this medicine? They need to know if you have any of these conditions:  previous heart attack or heart failure  an unusual or allergic reaction to isosorbide mononitrate, nitrates, other medicines, foods, dyes, or preservatives  pregnant or trying to get pregnant  breast-feeding How should I use this medicine? Take this medicine by mouth with a glass of water. Follow the directions on the prescription label. Do not crush or chew. Take your medicine at regular intervals. Do not take your medicine more often than directed. Do not stop taking this medicine except on the advice of your doctor or health care professional. Talk to your pediatrician regarding the use of this medicine in children. Special care may be needed. Overdosage: If you think you have taken too much of this medicine contact a poison control center or emergency room at once. NOTE: This medicine is only for you. Do not share this medicine with others. What if I miss a dose? If you miss a dose, take it as soon as you can. If it is almost time for your next dose, take only that dose. Do not take double or extra doses. What may interact with this medicine? Do not take this medicine with any of the following medications:  medicines used to treat erectile  dysfunction (ED) like avanafil, sildenafil, tadalafil, and vardenafil  riociguat This medicine may also interact with the following medications:  medicines for high blood pressure  other medicines for angina or heart failure This list may not describe all possible interactions. Give your health care provider a list of all the medicines, herbs, non-prescription drugs, or dietary supplements you use. Also tell them if you smoke,  drink alcohol, or use illegal drugs. Some items may interact with your medicine. What should I watch for while using this medicine? Check your heart rate and blood pressure regularly while you are taking this medicine. Ask your doctor or health care professional what your heart rate and blood pressure should be and when you should contact him or her. Tell your doctor or health care professional if you feel your medicine is no longer working. You may get dizzy. Do not drive, use machinery, or do anything that needs mental alertness until you know how this medicine affects you. To reduce the risk of dizzy or fainting spells, do not sit or stand up quickly, especially if you are an older patient. Alcohol can make you more dizzy, and increase flushing and rapid heartbeats. Avoid alcoholic drinks. Do not treat yourself for coughs, colds, or pain while you are taking this medicine without asking your doctor or health care professional for advice. Some ingredients may increase your blood pressure. What side effects may I notice from receiving this medicine? Side effects that you should report to your doctor or health care professional as soon as possible:  bluish discoloration of lips, fingernails, or palms of hands  irregular heartbeat, palpitations  low blood pressure  nausea, vomiting  persistent headache  unusually weak or tired Side effects that usually do not require medical attention (report to your doctor or health care professional if they continue or are bothersome):  flushing of the face or neck  rash This list may not describe all possible side effects. Call your doctor for medical advice about side effects. You may report side effects to FDA at 1-800-FDA-1088. Where should I keep my medicine? Keep out of the reach of children. Store between 15 and 30 degrees C (59 and 86 degrees F). Keep container tightly closed. Throw away any unused medicine after the expiration date. NOTE: This  sheet is a summary. It may not cover all possible information. If you have questions about this medicine, talk to your doctor, pharmacist, or health care provider.  2020 [employer] Standard (2012-11-21 14:48:19)

## 2019-10-21 NOTE — Progress Notes (Addendum)
Cardiology Office Note:    Date:  10/21/2019   ID:  Vincent Guerrero, DOB [birth year], MRN [id number]  PCP:  Simone Curia, MD  Cardiologist:  No primary care provider on file.  Electrophysiologist:  None   Referring MD: Simone Curia, MD   Chief Complaint  Patient presents with  . Follow-up    History of Present Illness:    Vincent Guerrero is a 59 y.o. male with a hx of coronary artery disease status post stent in the RCA a result of an MI in 2010, hyperlipidemia (not on statins due to statin intolerance), diabetes mellitus (on Metformin and glipizide), CVA in 2011, moderate aortic stenosis in April 2018.  Last saw the patient November 2020 at that time we discussed repeating his echocardiogram as well as he was cleared for his cataract surgery.  Today he comes for follow-up visit he tells me that he has been experiencing intermittent chest pain.  He described as a midsternal chest tightness, sometimes painful sensation that radiates to his left arm and down his fingers.  He noted that is been happening about 2-3 times a month.  The most recent was 2 nights ago he denies any associated shortness of breath.  He admits to intermittent fatigue.   Past Medical History:  Diagnosis Date  . Age-related nuclear cataract of both eyes 11/27/2018  . Anemia   . Bilateral recurrent inguinal hernia without obstruction or gangrene 05/09/2016   Added automatically from request for surgery [id number]  . Coronary artery disease involving native artery of transplanted heart without angina pectoris   . Cortical age-related cataract of both eyes 11/27/2018  . Depression   . Diabetes mellitus (HCC)   . Hyperlipidemia LDL goal <70   . Hypertension   . Inguinal hernia without obstruction or gangrene    BILATERAL  . Primary open angle glaucoma (POAG) of both eyes, severe stage 11/27/2018  . Primary open angle glaucoma (POAG) of left eye, severe stage 11/27/2018    Past Surgical History:  Procedure  Laterality Date  . HERNIA REPAIR  1998  . LEFT HEART CATH AND CORONARY ANGIOGRAPHY N/A 06/04/2016   Procedure: Left Heart Cath and Coronary Angiography;  Surgeon: Lyn Records, MD;  Location: [facility] INVASIVE CV LAB;  Service: Cardiovascular;  Laterality: N/A;    Current Medications: Current Meds  Medication Sig  . Aspirin-Salicylamide-Caffeine (BC HEADACHE POWDER PO) Take by mouth 3 (three) times daily.  Marland Kitchen glipiZIDE (GLUCOTROL) 10 MG tablet Take 10 mg by mouth in the morning.  Marland Kitchen lisinopril (PRINIVIL,ZESTRIL) 20 MG tablet Take 1 tablet (20 mg total) by mouth 2 (two) times daily.  . metFORMIN (GLUCOPHAGE) 1000 MG tablet Take 1,000 mg by mouth 2 (two) times daily with a meal.  . metoprolol (LOPRESSOR) 50 MG tablet Take 1 tablet (50 mg total) by mouth 2 (two) times daily.  . potassium chloride SA (KLOR-CON M20) 20 MEQ tablet Take 1 tab (20 meq) on Tues,Thurs,and Saturdays     Allergies:   Patient has no known allergies.   Social History   Socioeconomic History  . Marital status: Legally Separated    Spouse name: Not on file  . Number of children: Not on file  . Years of education: Not on file  . Highest education level: Not on file  Occupational History  . Not on file  Tobacco Use  . Smoking status: Current Every Day Smoker    Types: Cigarettes  . Smokeless tobacco: Current User    Types: Chew  Vaping Use  . Vaping Use: Never used  Substance and Sexual Activity  . Alcohol use: No  . Drug use: Yes    Types: Marijuana  . Sexual activity: Yes  Other Topics Concern  . Not on file  Social History Narrative  . Not on file   Social Determinants of Health   Financial Resource Strain:   . Difficulty of Paying Living Expenses: Not on file  Food Insecurity:   . Worried About Programme researcher, broadcasting/film/video in the Last Year: Not on file  . Ran Out of Food in the Last Year: Not on file  Transportation Needs:   . Lack of Transportation (Medical): Not on file  . Lack of Transportation  (Non-Medical): Not on file  Physical Activity:   . Days of Exercise per Week: Not on file  . Minutes of Exercise per Session: Not on file  Stress:   . Feeling of Stress : Not on file  Social Connections:   . Frequency of Communication with Friends and Family: Not on file  . Frequency of Social Gatherings with Friends and Family: Not on file  . Attends Religious Services: Not on file  . Active Member of Clubs or Organizations: Not on file  . Attends Banker Meetings: Not on file  . Marital Status: Not on file     Family History: The patient's family history includes CAD in his father; Diabetes in his father; Heart failure in his mother; Hypertension in his mother; Prostate cancer in his brother.  ROS:   Review of Systems  Constitution: Negative for decreased appetite, fever and weight gain.  HENT: Negative for congestion, ear discharge, hoarse voice and sore throat.   Eyes: Negative for discharge, redness, vision loss in right eye and visual halos.  Cardiovascular: Reports chest pain.  Negative for , dyspnea on exertion, leg swelling, orthopnea and palpitations.  Respiratory: Negative for cough, hemoptysis, shortness of breath and snoring.   Endocrine: Negative for heat intolerance and polyphagia.  Hematologic/Lymphatic: Negative for bleeding problem. Does not bruise/bleed easily.  Skin: Negative for flushing, nail changes, rash and suspicious lesions.  Musculoskeletal: Negative for arthritis, joint pain, muscle cramps, myalgias, neck pain and stiffness.  Gastrointestinal: Negative for abdominal pain, bowel incontinence, diarrhea and excessive appetite.  Genitourinary: Negative for decreased libido, genital sores and incomplete emptying.  Neurological: Negative for brief paralysis, focal weakness, headaches and loss of balance.  Psychiatric/Behavioral: Negative for altered mental status, depression and suicidal ideas.  Allergic/Immunologic: Negative for HIV exposure and  persistent infections.    EKGs/Labs/Other Studies Reviewed:    The following studies were reviewed today:   EKG:  The ekg ordered today demonstrates sinus rhythm, heart rate 70 bpm compared to prior EKG no significant change.  Echo IMPRESSIONS  1. Left ventricular ejection fraction, by estimation, is 60 to 65%. The left ventricle has normal function. The left ventricle has no regional wall motion abnormalities. There is moderate left ventricular hypertrophy.  Left ventricular diastolic parameters are consistent with Grade I diastolic dysfunction (impaired relaxation).  2. Right ventricular systolic function is normal. The right ventricular size is normal. There is normal pulmonary artery systolic pressure.  3. The mitral valve is normal in structure. No evidence of mitral valve  regurgitation. No evidence of mitral stenosis.  4. The aortic valve is normal in structure. Aortic valve regurgitation is  moderate. Moderate aortic valve stenosis.  5. The inferior vena cava is normal in size with greater than 50%  respiratory variability,  suggesting right atrial pressure of 3 mmHg.    Recent Labs: 12/10/2018: BUN 20; Creatinine, Ser 0.83; Magnesium 1.8; Potassium 4.8; Sodium 141; TSH 0.585  Recent Lipid Panel    Component Value Date/Time   CHOL 172 12/10/2018 1058   TRIG 165 (H) 12/10/2018 1058   HDL 43 12/10/2018 1058   CHOLHDL 4.0 12/10/2018 1058   LDLCALC 100 (H) 12/10/2018 1058    Physical Exam:    VS:  BP 120/80 (BP Location: Left Arm, Patient Position: Sitting, Cuff Size: Normal)   Pulse 72   Ht  (1.803 m)   Wt 208 lb (94.3 kg)   SpO2 97%   BMI 29.01 kg/m     Wt Readings from Last 3 Encounters:  10/21/19 208 lb (94.3 kg)  12/10/18 197 lb (89.4 kg)  12/05/18 198 lb (89.8 kg)     GEN: Well nourished, well developed in no acute distress HEENT: Normal NECK: No JVD; No carotid bruits LYMPHATICS: No lymphadenopathy CARDIAC: S1S2 noted,RRR, 3/6 ejection  systolic murmurs, rubs, gallops RESPIRATORY:  Clear to auscultation without rales, wheezing or rhonchi  ABDOMEN: Soft, non-tender, non-distended, +bowel sounds, no guarding. EXTREMITIES: No edema, No cyanosis, no clubbing MUSCULOSKELETAL:  No deformity  SKIN: Warm and dry NEUROLOGIC:  Alert and oriented x 3, non-focal PSYCHIATRIC:  Normal affect, good insight  ASSESSMENT:    1. Coronary artery disease involving native artery of transplanted heart without angina pectoris   2. Hyperlipidemia LDL goal <70   3. Essential hypertension   4. Diabetes mellitus due to underlying condition with hyperosmolarity without coma, without long-term current use of insulin (HCC)    PLAN:    His chest pain is concerning due to his history of CAD. I will start with a trial of antianginal. Imdur 30 mg daily. He is Aspirin will continue this for now. He had not been able to tolerate statin medication in the past and prefers not to try any other. His Lpa in November was 128  At that time I referred the patient to lipid clinic for potential start of  PCSK9 inhibitor however he was not able to get this make this appointment. He is agreeable again to be referred.   His recent echo showed no change to his AS - still moderate.   His blood pressure is acceptable in the office today.   DM - managed by his pcp.  The patient is in agreement with the above plan. The patient left the office in stable condition.  The patient will follow up in 1 month due to medication change.   Medication Adjustments/Labs and Tests Ordered: Current medicines are reviewed at length with the patient today.  Concerns regarding medicines are outlined above.  Orders Placed This Encounter  Procedures  . AMB Referral to [facility]  . EKG 12-Lead   Meds ordered this encounter  Medications  . isosorbide mononitrate (IMDUR) 30 MG 24 hr tablet    Sig: Take 1 tablet (30 mg total) by mouth daily.    Dispense:  90 tablet     Refill:  1    Patient Instructions  Medication Instructions:  Your physician has recommended you make the following change in your medication:   START: imdur 30 mg daily   *If you need a refill on your cardiac medications before your next appointment, please call your pharmacy*   Lab Work: None If you have labs (blood work) drawn today and your tests are completely normal, you will receive your  results only by: Marland Kitchen. MyChart Message (if you have MyChart) OR . A paper copy in the mail If you have any lab test that is abnormal or we need to change your treatment, we will call you to review the results.   Testing/Procedures: none   Follow-Up: At [facility], you and your health needs are our priority.  As part of our continuing mission to provide you with exceptional heart care, we have created designated Provider Care Teams.  These Care Teams include your primary Cardiologist (physician) and Advanced Practice Providers (APPs -  Physician Assistants and Nurse Practitioners) who all work together to provide you with the care you need, when you need it.  We recommend signing up for the patient portal called "MyChart".  Sign up information is provided on this After Visit Summary.  MyChart is used to connect with patients for Virtual Visits (Telemedicine).  Patients are able to view lab/test results, encounter notes, upcoming appointments, etc.  Non-urgent messages can be sent to your provider as well.   To learn more about what you can do with MyChart, go to [email or web address].    Your next appointment:   1 month(s)  The format for your next appointment:   In Person  Provider:   Thomasene RippleKardie Wendall Isabell, DO   Other Instructions  Isosorbide Mononitrate extended-release tablets What is this medicine? ISOSORBIDE MONONITRATE (eye soe SOR bide mon oh NYE trate) is a vasodilator. It relaxes blood vessels, increasing the blood and oxygen supply to your heart. This medicine is used to  prevent chest pain caused by angina. It will not help to stop an episode of chest pain. This medicine may be used for other purposes; ask your health care provider or pharmacist if you have questions. COMMON BRAND NAME(S): Imdur, Isotrate ER What should I tell my health care provider before I take this medicine? They need to know if you have any of these conditions:  previous heart attack or heart failure  an unusual or allergic reaction to isosorbide mononitrate, nitrates, other medicines, foods, dyes, or preservatives  pregnant or trying to get pregnant  breast-feeding How should I use this medicine? Take this medicine by mouth with a glass of water. Follow the directions on the prescription label. Do not crush or chew. Take your medicine at regular intervals. Do not take your medicine more often than directed. Do not stop taking this medicine except on the advice of your doctor or health care professional. Talk to your pediatrician regarding the use of this medicine in children. Special care may be needed. Overdosage: If you think you have taken too much of this medicine contact a poison control center or emergency room at once. NOTE: This medicine is only for you. Do not share this medicine with others. What if I miss a dose? If you miss a dose, take it as soon as you can. If it is almost time for your next dose, take only that dose. Do not take double or extra doses. What may interact with this medicine? Do not take this medicine with any of the following medications:  medicines used to treat erectile dysfunction (ED) like avanafil, sildenafil, tadalafil, and vardenafil  riociguat This medicine may also interact with the following medications:  medicines for high blood pressure  other medicines for angina or heart failure This list may not describe all possible interactions. Give your health care provider a list of all the medicines, herbs, non-prescription drugs, or dietary  supplements you use. Also tell them  if you smoke, drink alcohol, or use illegal drugs. Some items may interact with your medicine. What should I watch for while using this medicine? Check your heart rate and blood pressure regularly while you are taking this medicine. Ask your doctor or health care professional what your heart rate and blood pressure should be and when you should contact him or her. Tell your doctor or health care professional if you feel your medicine is no longer working. You may get dizzy. Do not drive, use machinery, or do anything that needs mental alertness until you know how this medicine affects you. To reduce the risk of dizzy or fainting spells, do not sit or stand up quickly, especially if you are an older patient. Alcohol can make you more dizzy, and increase flushing and rapid heartbeats. Avoid alcoholic drinks. Do not treat yourself for coughs, colds, or pain while you are taking this medicine without asking your doctor or health care professional for advice. Some ingredients may increase your blood pressure. What side effects may I notice from receiving this medicine? Side effects that you should report to your doctor or health care professional as soon as possible:  bluish discoloration of lips, fingernails, or palms of hands  irregular heartbeat, palpitations  low blood pressure  nausea, vomiting  persistent headache  unusually weak or tired Side effects that usually do not require medical attention (report to your doctor or health care professional if they continue or are bothersome):  flushing of the face or neck  rash This list may not describe all possible side effects. Call your doctor for medical advice about side effects. You may report side effects to FDA at 1-800-FDA-1088. Where should I keep my medicine? Keep out of the reach of children. Store between 15 and 30 degrees C (59 and 86 degrees F). Keep container tightly closed. Throw away any unused  medicine after the expiration date. NOTE: This sheet is a summary. It may not cover all possible information. If you have questions about this medicine, talk to your doctor, pharmacist, or health care provider.  2020 Elsevier/Gold Standard (2012-11-21 14:48:19)      Adopting a Healthy Lifestyle.  Know what a healthy weight is for you (roughly BMI <25) and aim to maintain this   Aim for 7+ servings of fruits and vegetables daily   65-80+ fluid ounces of water or unsweet tea for healthy kidneys   Limit to max 1 drink of alcohol per day; avoid smoking/tobacco   Limit animal fats in diet for cholesterol and heart health - choose grass fed whenever available   Avoid highly processed foods, and foods high in saturated/trans fats   Aim for low stress - take time to unwind and care for your mental health   Aim for 150 min of moderate intensity exercise weekly for heart health, and weights twice weekly for bone health   Aim for 7-9 hours of sleep daily   When it comes to diets, agreement about the perfect plan isnt easy to find, even among the experts. Experts at the [facility] [employer] developed an idea known as the Healthy Eating Plate. Just imagine a plate divided into logical, healthy portions.   The emphasis is on diet quality:   Load up on vegetables and fruits - one-half of your plate: Aim for color and variety, and remember that potatoes dont count.   Go for whole grains - one-quarter of your plate: Whole wheat, barley, wheat berries, quinoa, oats, brown rice,  and foods made with them. If you want pasta, go with whole wheat pasta.   Protein power - one-quarter of your plate: Fish, chicken, beans, and nuts are all healthy, versatile protein sources. Limit red meat.   The diet, however, does go beyond the plate, offering a few other suggestions.   Use healthy plant oils, such as olive, canola, soy, corn, sunflower and peanut. Check the labels, and avoid partially  hydrogenated oil, which have unhealthy trans fats.   If youre thirsty, drink water. Coffee and tea are good in moderation, but skip sugary drinks and limit milk and dairy products to one or two daily servings.   The type of carbohydrate in the diet is more important than the amount. Some sources of carbohydrates, such as vegetables, fruits, whole grains, and beans-are healthier than others.   Finally, stay active  Signed, Thomasene Ripple, DO  10/21/2019 9:24 PM    [facility]

## 2019-10-28 ENCOUNTER — Other Ambulatory Visit: Payer: Self-pay | Admitting: Cardiology

## 2019-10-28 NOTE — Telephone Encounter (Signed)
Message left for patient to callback regarding the Nitroglycerin request. Pt does not have Nitroglycerin listed on his medications and we do not send in a rx for 90 tablets as they do not last.

## 2019-10-28 NOTE — Telephone Encounter (Signed)
*  STAT* If patient is at the pharmacy, call can be transferred to refill team.   1. Which medications need to be refilled? (please list name of each medication and dose if known) nitroglycerin  2. Which pharmacy/location (including street and city if local pharmacy) is medication to be sent to?  [facility] - [place], [place] - [address]   3. Do they need a 30 day or 90 day supply? 90 day

## 2019-11-03 ENCOUNTER — Telehealth: Payer: Self-pay | Admitting: Cardiology

## 2019-11-03 NOTE — Telephone Encounter (Signed)
Called patient. He reports he needed to be scheduled for lipid clinic. I gave him the number to contact [facility] as they will set up appointment. No further questions.

## 2019-11-03 NOTE — Telephone Encounter (Signed)
Patient came in today stated he needs to schedule an appt for "something". No active requests in pt chart. Stated he was seen at [facility] on Friday for Chest Pain. Patient appears to be confused. Please call patient.

## 2019-11-05 ENCOUNTER — Other Ambulatory Visit: Payer: Self-pay

## 2019-11-05 ENCOUNTER — Ambulatory Visit (INDEPENDENT_AMBULATORY_CARE_PROVIDER_SITE_OTHER): Payer: BLUE CROSS/BLUE SHIELD | Admitting: Pharmacist Clinician (PhC)/ Clinical Pharmacy Specialist

## 2019-11-05 VITALS — BP 138/82 | HR 73 | Resp 16 | Ht 70.0 in | Wt 208.0 lb

## 2019-11-05 DIAGNOSIS — E785 Hyperlipidemia, unspecified: Secondary | ICD-10-CM

## 2019-11-05 DIAGNOSIS — G720 Drug-induced myopathy: Secondary | ICD-10-CM | POA: Diagnosis not present

## 2019-11-05 DIAGNOSIS — F17200 Nicotine dependence, unspecified, uncomplicated: Secondary | ICD-10-CM | POA: Insufficient documentation

## 2019-11-05 DIAGNOSIS — Z720 Tobacco use: Secondary | ICD-10-CM

## 2019-11-05 DIAGNOSIS — T466X5A Adverse effect of antihyperlipidemic and antiarteriosclerotic drugs, initial encounter: Secondary | ICD-10-CM

## 2019-11-05 HISTORY — DX: Tobacco use: Z72.0

## 2019-11-05 MED ORDER — BUPROPION HCL ER (SMOKING DET) 150 MG PO TB12: ORAL_TABLET | ORAL | 3 refills | Status: DC

## 2019-11-05 NOTE — Progress Notes (Signed)
11/06/2019 [name] February 29, 1960 [id number]   HPI:  Vincent Guerrero is a 59 y.o. male patient of Dr Servando Salina, who presents today for a lipid clinic evaluation.  See pertinent past medical history below.   He was seen by Dr. Servando Salina last month because of some intermittent chest pain.  He had previous MI and stent, and was not currently on any lipid treatment. It had been several years since he last tried a statin, but is not interested in re-challenging.    Past Medical History: ASCVD 2010 MI with stent to RCA; CVA in 2011  DM2 No current A1c available - On metformin and glipizide  hypertension Controlled on lisinopril  Tobacco abuse 1 ppd currently, would like information on cessation   Current Medications: none  Cholesterol Goals:  LDL <70   Intolerant/previously tried: atorvastatin, rosuvastatin both caused myalgias  Family history: mother with hypertension, CHF, died at 1; father with DM complications died at 11; sister with hypertension, brother - unsure  Social: smokes 1 ppd - is interested in quitting; no alcohol  Diet: almost all home cooked foods, seasons with salt when cooking; admits to 50% being fried foods; mix of proteins and vegetables (fresh and frozen)  Exercise: not able to exercise, legs bother him regularly  Labs: 12/2018: TC 172, TG 165, HDL 43, LDL 100   Current Outpatient Medications  Medication Sig Dispense Refill   Aspirin-Salicylamide-Caffeine (BC HEADACHE POWDER PO) Take by mouth 3 (three) times daily.     brimonidine (ALPHAGAN) 0.2 % ophthalmic solution Place 1 drop into the right eye 2 (two) times daily.      Difluprednate 0.05 % EMUL Apply 1 drop to eye in the morning, at noon, in the evening, and at bedtime.      glipiZIDE (GLUCOTROL) 10 MG tablet Take 10 mg by mouth in the morning.     isosorbide mononitrate (IMDUR) 30 MG 24 hr tablet Take 1 tablet (30 mg total) by mouth daily. 90 tablet 1   lisinopril (PRINIVIL,ZESTRIL) 20 MG tablet  Take 1 tablet (20 mg total) by mouth 2 (two) times daily. 180 tablet 2   metFORMIN (GLUCOPHAGE) 1000 MG tablet Take 1,000 mg by mouth 2 (two) times daily with a meal.     metoprolol (LOPRESSOR) 50 MG tablet Take 1 tablet (50 mg total) by mouth 2 (two) times daily. 180 tablet 2   Netarsudil-Latanoprost (ROCKLATAN) 0.02-0.005 % SOLN Place 1 drop into the right eye at bedtime.      potassium chloride SA (KLOR-CON M20) 20 MEQ tablet Take 1 tab (20 meq) on Tues,Thurs,and Saturdays 45 tablet 1   buPROPion (ZYBAN) 150 MG 12 hr tablet Start with 1 tablet daily for 3 days, then increase to 1 tablet twice daily.  Start 1 week before your quit date. 60 tablet 3   No current facility-administered medications for this visit.    No Known Allergies  Past Medical History:  Diagnosis Date   Age-related nuclear cataract of both eyes 11/27/2018   Anemia    Bilateral recurrent inguinal hernia without obstruction or gangrene 05/09/2016   Added automatically from request for surgery [id number]   Coronary artery disease involving native artery of transplanted heart without angina pectoris    Cortical age-related cataract of both eyes 11/27/2018   Depression    Diabetes mellitus (HCC)    Hyperlipidemia LDL goal <70    Hypertension    Inguinal hernia without obstruction or gangrene    BILATERAL   Primary open angle  glaucoma (POAG) of both eyes, severe stage 11/27/2018   Primary open angle glaucoma (POAG) of left eye, severe stage 11/27/2018    Blood pressure 138/82, pulse 73, resp. rate 16, height 5\' 10"  (1.778 m), weight 208 lb (94.3 kg), SpO2 98 %.   No problem-specific Assessment & Plan notes found for this encounter.   PharmD CPP [facility] [address] [place], [place] [phone]

## 2019-11-05 NOTE — Patient Instructions (Addendum)
Your Results:             Your most recent labs Goal  Total Cholesterol 172 < 200  Triglycerides 165 < 150  HDL (happy/good cholesterol) 43 > 40  LDL (lousy/bad cholesterol 100 < 70      Medication changes:  We will get the approval for your insurance to cover Repatha Pushtronix.  You will use one cartridge every month, about the same time each month  Lab orders:  After you have done 3 infusions (around the first of December) we will have you repeat labs to make sure the medication is working for you.    If the medication is cost prohibitive, please call Suhayla Chisom/Raquel at [phone]  Thank you for choosing [facility]

## 2019-11-06 ENCOUNTER — Ambulatory Visit (INDEPENDENT_AMBULATORY_CARE_PROVIDER_SITE_OTHER): Payer: BLUE CROSS/BLUE SHIELD | Admitting: Cardiology

## 2019-11-06 ENCOUNTER — Encounter: Payer: Self-pay | Admitting: Cardiology

## 2019-11-06 VITALS — BP 138/84 | HR 69 | Ht 70.0 in | Wt 205.0 lb

## 2019-11-06 DIAGNOSIS — I25119 Atherosclerotic heart disease of native coronary artery with unspecified angina pectoris: Secondary | ICD-10-CM

## 2019-11-06 DIAGNOSIS — E0801 Diabetes mellitus due to underlying condition with hyperosmolarity with coma: Secondary | ICD-10-CM

## 2019-11-06 DIAGNOSIS — I10 Essential (primary) hypertension: Secondary | ICD-10-CM | POA: Diagnosis not present

## 2019-11-06 DIAGNOSIS — Z720 Tobacco use: Secondary | ICD-10-CM

## 2019-11-06 DIAGNOSIS — E785 Hyperlipidemia, unspecified: Secondary | ICD-10-CM

## 2019-11-06 DIAGNOSIS — R0789 Other chest pain: Secondary | ICD-10-CM | POA: Diagnosis not present

## 2019-11-06 HISTORY — DX: Other chest pain: R07.89

## 2019-11-06 HISTORY — DX: Tobacco use: Z72.0

## 2019-11-06 MED ORDER — ISOSORBIDE MONONITRATE ER 60 MG PO TB24: 60.0000 mg | ORAL_TABLET | Freq: Every day | ORAL | 1 refills | Status: DC

## 2019-11-06 NOTE — Assessment & Plan Note (Addendum)
Patient with elevated LDL, not able to tolerate multiple statin drugs.  Reviewed options for lowering LDL cholesterol, including ezetimibe, PCSK-9 inhibitors and bempedoic acid.  Discussed mechanisms of action, dosing, side effects and potential decreases in LDL cholesterol.  Answered all patient questions.  Based on this information, patient would prefer to start Repatha 420 mg Pushtronix.  He was still somewhat leery of injectable medication, so first dose was given from samples in office.  With direction, he assembled the pushtronix and infused the medication without any concerns.  We will start paperwork to get this approved by his insurance company.  Once approved, he will need to repeat lipid labs after 3 doses (early December).

## 2019-11-06 NOTE — Patient Instructions (Addendum)
Medication Instructions:   Your physician has recommended you make the following change in your medication:  INCREASE: Imdure to 60 mg daily   *If you need a refill on your cardiac medications before your next appointment, please call your pharmacy*   Lab Work: Your physician recommends that you return for lab work today: bmp, cbc  If you have labs (blood work) drawn today and your tests are completely normal, you will receive your results only by:  MyChart Message (if you have MyChart) OR  A paper copy in the mail If you have any lab test that is abnormal or we need to change your treatment, we will call you to review the results.   Testing/Procedures:    [facility] CARDIOVASCULAR [facility] [place] [address] [place] [address] Dept: [phone] Loc: [phone]  Vincent Guerrero  11/06/2019  You are scheduled for a Cardiac Catheterization on Tuesday, October 12 with Dr. Bryan Lemma.  1. Please arrive at the [facility] (Main Entrance A) at [facility]: [address] [place], [place] [address] at 5:30 AM (This time is two hours before your procedure to ensure your preparation). Free valet parking service is available.   Special note: Every effort is made to have your procedure done on time. Please understand that emergencies sometimes delay scheduled procedures.  2. Diet: Do not eat solid foods after midnight.  The patient may have clear liquids until 5am upon the day of the procedure.  3. Labs: You will have labs drawn today.  4. Medication instructions in preparation for your procedure:   Contrast Allergy: No    HOLD Glipizide on the day of the procedure   Do not take Diabetes Med Glucophage (Metformin) on the day of the procedure and HOLD 48 HOURS AFTER THE PROCEDURE.  On the morning of your procedure, take your Aspirin and any morning medicines NOT listed above.  You may use sips of  water.  5. Plan for one night stay--bring personal belongings. 6. Bring a current list of your medications and current insurance cards. 7. You MUST have a responsible person to drive you home. 8. Someone MUST be with you the first 24 hours after you arrive home or your discharge will be delayed. 9. Please wear clothes that are easy to get on and off and wear slip-on shoes.  Thank you for allowing [place] to care for you!   -- [facility]    Follow-Up: At [facility], you and your health needs are our priority.  As part of our continuing mission to provide you with exceptional heart care, we have created designated Provider Care Teams.  These Care Teams include your primary Cardiologist (physician) and Advanced Practice Providers (APPs -  Physician Assistants and Nurse Practitioners) who all work together to provide you with the care you need, when you need it.  We recommend signing up for the patient portal called "MyChart".  Sign up information is provided on this After Visit Summary.  MyChart is used to connect with patients for Virtual Visits (Telemedicine).  Patients are able to view lab/test results, encounter notes, upcoming appointments, etc.  Non-urgent messages can be sent to your provider as well.   To learn more about what you can do with MyChart, go to [email or web address].    Your next appointment:   2 week(s)  The format for your next appointment:   In Person  Provider:   Thomasene Ripple,  DO   Other Instructions   Coronary Angiogram With Stent Coronary angiogram with stent placement is a procedure to widen or open a narrow blood vessel of the heart (coronary artery). Arteries may become blocked by cholesterol buildup (plaques) in the lining of the artery wall. When a coronary artery becomes partially blocked, blood flow to that area decreases. This may lead to chest pain or a heart attack (myocardial infarction). A stent is a small piece  of metal that looks like mesh or spring. Stent placement may be done as treatment after a heart attack, or to prevent a heart attack if a blocked artery is found by a coronary angiogram. Let your health care provider know about:  Any allergies you have, including allergies to medicines or contrast dye.  All medicines you are taking, including vitamins, herbs, eye drops, creams, and over-the-counter medicines.  Any problems you or family members have had with anesthetic medicines.  Any blood disorders you have.  Any surgeries you have had.  Any medical conditions you have, including kidney problems or kidney failure.  Whether you are pregnant or may be pregnant.  Whether you are breastfeeding. What are the risks? Generally, this is a safe procedure. However, serious problems may occur, including:  Damage to nearby structures or organs, such as the heart, blood vessels, or kidneys.  A return of blockage.  Bleeding, infection, or bruising at the insertion site.  A collection of blood under the skin (hematoma) at the insertion site.  A blood clot in another part of the body.  Allergic reaction to medicines or dyes.  Bleeding into the abdomen (retroperitoneal bleeding).  Stroke (rare).  Heart attack (rare). What happens before the procedure? Staying hydrated Follow instructions from your health care provider about hydration, which may include:  Up to 2 hours before the procedure - you may continue to drink clear liquids, such as water, clear fruit juice, black coffee, and plain tea.  Eating and drinking restrictions Follow instructions from your health care provider about eating and drinking, which may include:  8 hours before the procedure - stop eating heavy meals or foods, such as meat, fried foods, or fatty foods.  6 hours before the procedure - stop eating light meals or foods, such as toast or cereal.  2 hours before the procedure - stop drinking clear  liquids. Medicines Ask your health care provider about:  Changing or stopping your regular medicines. This is especially important if you are taking diabetes medicines or blood thinners.  Taking medicines such as aspirin and ibuprofen. These medicines can thin your blood. Do not take these medicines unless your health care provider tells you to take them. ? Generally, aspirin is recommended before a thin tube, called a catheter, is passed through a blood vessel and inserted into the heart (cardiac catheterization).  Taking over-the-counter medicines, vitamins, herbs, and supplements. General instructions  Do not use any products that contain nicotine or tobacco for at least 4 weeks before the procedure. These products include cigarettes, e-cigarettes, and chewing tobacco. If you need help quitting, ask your health care provider.  Plan to have someone take you home from the hospital or clinic.  If you will be going home right after the procedure, plan to have someone with you for 24 hours.  You may have tests and imaging procedures.  Ask your health care provider: ? How your insertion site will be marked. Ask which artery will be used for the procedure. ? What steps will be  taken to help prevent infection. These may include:  Removing hair at the insertion site.  Washing skin with a germ-killing soap.  Taking antibiotic medicine. What happens during the procedure?   An IV will be inserted into one of your veins.  Electrodes may be placed on your chest to monitor your heart rate during the procedure.  You will be given one or more of the following: ? A medicine to help you relax (sedative). ? A medicine to numb the area (local anesthetic) for catheter insertion.  A small incision will be made for catheter insertion.  The catheter will be inserted into an artery using a guide wire. The location may be in your groin, your wrist, or the fold of your arm (near your elbow).  An  X-ray procedure (fluoroscopy) will be used to help guide the catheter to the opening of the heart arteries.  A dye will be injected into the catheter. X-rays will be taken. The dye helps to show where any narrowing or blockages are located in the arteries.  Tell your health care provider if you have chest pain or trouble breathing.  A tiny wire will be guided to the blocked spot, and a balloon will be inflated to make the artery wider.  The stent will be expanded to crush the plaques into the wall of the vessel. The stent will hold the area open and improve the blood flow. Most stents have a drug coating to reduce the risk of the stent narrowing over time.  The artery may be made wider using a drill, laser, or other tools that remove plaques.  The catheter will be removed when the blood flow improves. The stent will stay where it was placed, and the lining of the artery will grow over it.  A bandage (dressing) will be placed on the insertion site. Pressure will be applied to stop bleeding.  The IV will be removed. This procedure may vary among health care providers and hospitals. What happens after the procedure?  Your blood pressure, heart rate, breathing rate, and blood oxygen level will be monitored until you leave the hospital or clinic.  If the procedure is done through the leg, you will lie flat in bed for a few hours or for as long as told by your health care provider. You will be instructed not to bend or cross your legs.  The insertion site and the pulse in your foot or wrist will be checked often.  You may have more blood tests, X-rays, and a test that records the electrical activity of your heart (electrocardiogram, or ECG).  Do not drive for 24 hours if you were given a sedative during your procedure. Summary  Coronary angiogram with stent placement is a procedure to widen or open a narrowed coronary artery. This is done to treat heart problems.  Before the procedure, let  your health care provider know about all the medical conditions and surgeries you have or have had.  This is a safe procedure. However, some problems may occur, including damage to nearby structures or organs, bleeding, blood clots, or allergies.  Follow your health care provider's instructions about eating, drinking, medicines, and other lifestyle changes, such as quitting tobacco use before the procedure. This information is not intended to replace advice given to you by your health care provider. Make sure you discuss any questions you have with your health care provider. Document Revised: 08/13/2018 Document Reviewed: 08/13/2018 Elsevier Patient Education  2020 [employer].

## 2019-11-06 NOTE — Progress Notes (Addendum)
Cardiology Office Note:    Date:  11/07/2019   ID:  Vincent Guerrero, DOB May 28, 1960, MRN [id number]  PCP:  Simone Curia, MD  Cardiologist:  Thomasene Ripple, DO  Electrophysiologist:  None   Referring MD: Simone Curia, MD   Follow-up for chest pain  History of Present Illness:    Vincent Guerrero a 59 y.o.malewith a hx of coronary artery disease status post stentin the RCAa result of an MI in 2010,hyperlipidemia (not on statins due to statin intolerance), diabetes mellitus (on Metformin and glipizide),, current smoker CVA in 2011, moderate aortic stenosis in April 2018.   I saw the patient on October 21, 2018 at that time he was Spears intermittent chest pain.  At that time I added Imdur 30 mg to his regimen.  He was continues on aspirin.  Unfortunately his LV 80 November was 128 and the patient had not been on statin therapy due to significant intolerance I referred the patient to our lipid clinic for PCSK9 inhibitor.    The patient was able to see the lipid clinic and yesterday was started on Repatha for 20 mg Pushtronix.  We talked about this medication and he has committed to using this.  Today he requested to be seen as his chest pain is getting worse he tells me.  He notes that it does not matter what he is doing now he is having significant chest pain.  Initially he believes the Imdur helped but now it is not helping at all.  He was seen at the [facility] on September 24 as he presented due to worsening chest pain.  He did get a EKG which I was able to review was not impressive for any ischemic changes.  He is concerned because now with the chest pain he is significantly fatigue and he is getting intermittent shortness of breath with his chest pain.  Past Medical History:  Diagnosis Date  . Age-related nuclear cataract of both eyes 11/27/2018  . Anemia   . Bilateral recurrent inguinal hernia without obstruction or gangrene 05/09/2016   Added automatically from  request for surgery [id number]  . Coronary artery disease involving native artery of transplanted heart without angina pectoris   . Cortical age-related cataract of both eyes 11/27/2018  . Depression   . Diabetes mellitus (HCC)   . Hyperlipidemia LDL goal <70   . Hypertension   . Inguinal hernia without obstruction or gangrene    BILATERAL  . Primary open angle glaucoma (POAG) of both eyes, severe stage 11/27/2018  . Primary open angle glaucoma (POAG) of left eye, severe stage 11/27/2018    Past Surgical History:  Procedure Laterality Date  . HERNIA REPAIR  1998  . LEFT HEART CATH AND CORONARY ANGIOGRAPHY N/A 06/04/2016   Procedure: Left Heart Cath and Coronary Angiography;  Surgeon: Lyn Records, MD;  Location: [facility] INVASIVE CV LAB;  Service: Cardiovascular;  Laterality: N/A;    Current Medications: Current Meds  Medication Sig  . Aspirin-Salicylamide-Caffeine (BC HEADACHE POWDER PO) Take by mouth 3 (three) times daily.  . brimonidine (ALPHAGAN) 0.2 % ophthalmic solution Place 1 drop into the right eye 2 (two) times daily.   Marland Kitchen buPROPion (ZYBAN) 150 MG 12 hr tablet Start with 1 tablet daily for 3 days, then increase to 1 tablet twice daily.  Start 1 week before your quit date.  . Difluprednate 0.05 % EMUL Apply 1 drop to eye in the morning, at noon, in the evening, and at bedtime.   Marland Kitchen  Evolocumab with Infusor (REPATHA PUSHTRONEX SYSTEM) 420 MG/3.5ML SOCT Inject 420 mg into the skin every 30 (thirty) days.  Marland Kitchen glipiZIDE (GLUCOTROL) 10 MG tablet Take 10 mg by mouth in the morning.  . isosorbide mononitrate (IMDUR) 60 MG 24 hr tablet Take 1 tablet (60 mg total) by mouth daily.  Marland Kitchen lisinopril (PRINIVIL,ZESTRIL) 20 MG tablet Take 1 tablet (20 mg total) by mouth 2 (two) times daily.  . metFORMIN (GLUCOPHAGE) 1000 MG tablet Take 1,000 mg by mouth 2 (two) times daily with a meal.  . metoprolol (LOPRESSOR) 50 MG tablet Take 1 tablet (50 mg total) by mouth 2 (two) times daily.  .  Netarsudil-Latanoprost (ROCKLATAN) 0.02-0.005 % SOLN Place 1 drop into the right eye at bedtime.   . potassium chloride SA (KLOR-CON M20) 20 MEQ tablet Take 1 tab (20 meq) on Tues,Thurs,and Saturdays  . [DISCONTINUED] isosorbide mononitrate (IMDUR) 30 MG 24 hr tablet Take 1 tablet (30 mg total) by mouth daily.     Allergies:   Patient has no known allergies.   Social History   Socioeconomic History  . Marital status: Legally Separated    Spouse name: Not on file  . Number of children: Not on file  . Years of education: Not on file  . Highest education level: Not on file  Occupational History  . Not on file  Tobacco Use  . Smoking status: Current Every Day Smoker    Packs/day: 1.00    Types: Cigarettes  . Smokeless tobacco: Current User    Types: Chew  Vaping Use  . Vaping Use: Never used  Substance and Sexual Activity  . Alcohol use: No  . Drug use: Yes    Types: Marijuana  . Sexual activity: Yes  Other Topics Concern  . Not on file  Social History Narrative  . Not on file   Social Determinants of Health   Financial Resource Strain:   . Difficulty of Paying Living Expenses: Not on file  Food Insecurity:   . Worried About Programme researcher, broadcasting/film/video in the Last Year: Not on file  . Ran Out of Food in the Last Year: Not on file  Transportation Needs:   . Lack of Transportation (Medical): Not on file  . Lack of Transportation (Non-Medical): Not on file  Physical Activity:   . Days of Exercise per Week: Not on file  . Minutes of Exercise per Session: Not on file  Stress:   . Feeling of Stress : Not on file  Social Connections:   . Frequency of Communication with Friends and Family: Not on file  . Frequency of Social Gatherings with Friends and Family: Not on file  . Attends Religious Services: Not on file  . Active Member of Clubs or Organizations: Not on file  . Attends Banker Meetings: Not on file  . Marital Status: Not on file     Family History: The  patient's family history includes CAD in his father; Diabetes in his father; Heart failure in his mother; Hypertension in his mother; Prostate cancer in his brother.  ROS:   Review of Systems  Constitution: Negative for decreased appetite, fever and weight gain.  HENT: Negative for congestion, ear discharge, hoarse voice and sore throat.   Eyes: Negative for discharge, redness, vision loss in right eye and visual halos.  Cardiovascular: Negative for chest pain, dyspnea on exertion, leg swelling, orthopnea and palpitations.  Respiratory: Negative for cough, hemoptysis, shortness of breath and snoring.   Endocrine: Negative  for heat intolerance and polyphagia.  Hematologic/Lymphatic: Negative for bleeding problem. Does not bruise/bleed easily.  Skin: Negative for flushing, nail changes, rash and suspicious lesions.  Musculoskeletal: Negative for arthritis, joint pain, muscle cramps, myalgias, neck pain and stiffness.  Gastrointestinal: Negative for abdominal pain, bowel incontinence, diarrhea and excessive appetite.  Genitourinary: Negative for decreased libido, genital sores and incomplete emptying.  Neurological: Negative for brief paralysis, focal weakness, headaches and loss of balance.  Psychiatric/Behavioral: Negative for altered mental status, depression and suicidal ideas.  Allergic/Immunologic: Negative for HIV exposure and persistent infections.    EKGs/Labs/Other Studies Reviewed:    The following studies were reviewed today:   EKG:  The ekg ordered today demonstrates   Echo IMPRESSIONS  1. Left ventricular ejection fraction, by estimation, is 60 to 65%. The left ventricle has normal function. The left ventricle has no regional wall motion abnormalities. There is moderate left ventricular hypertrophy.  Left ventricular diastolic parameters are consistent with Grade I diastolic dysfunction (impaired relaxation).  2. Right ventricular systolic function is normal. The right  ventricular size is normal. There is normal pulmonary artery systolic pressure.  3. The mitral valve is normal in structure. No evidence of mitral valve  regurgitation. No evidence of mitral stenosis.  4. The aortic valve is normal in structure. Aortic valve regurgitation is  moderate. Moderate aortic valve stenosis.  5. The inferior vena cava is normal in size with greater than 50%  respiratory variability, suggesting right atrial pressure of 3 mmHg.    Recent Labs: 12/10/2018: Magnesium 1.8; TSH 0.585 11/06/2019: BUN 17; Creatinine, Ser 1.01; Hemoglobin 8.4; Platelets 325; Potassium 4.6; Sodium 142  Recent Lipid Panel    Component Value Date/Time   CHOL 172 12/10/2018 1058   TRIG 165 (H) 12/10/2018 1058   HDL 43 12/10/2018 1058   CHOLHDL 4.0 12/10/2018 1058   LDLCALC 100 (H) 12/10/2018 1058    Physical Exam:    VS:  BP 138/84 (BP Location: Right Arm, Patient Position: Sitting, Cuff Size: Normal)   Pulse 69   Ht 5\' 10"  (1.778 m)   Wt 205 lb (93 kg)   SpO2 98%   BMI 29.41 kg/m     Wt Readings from Last 3 Encounters:  11/06/19 205 lb (93 kg)  11/05/19 208 lb (94.3 kg)  10/21/19 208 lb (94.3 kg)     GEN: Well nourished, well developed in no acute distress HEENT: Normal NECK: No JVD; No carotid bruits LYMPHATICS: No lymphadenopathy CARDIAC: S1S2 noted,RRR, no murmurs, rubs, gallops RESPIRATORY:  Clear to auscultation without rales, wheezing or rhonchi  ABDOMEN: Soft, non-tender, non-distended, +bowel sounds, no guarding. EXTREMITIES: No edema, No cyanosis, no clubbing MUSCULOSKELETAL:  No deformity  SKIN: Warm and dry NEUROLOGIC:  Alert and oriented x 3, non-focal PSYCHIATRIC:  Normal affect, good insight  ASSESSMENT:    1. Chest pressure   2. Coronary artery disease involving native coronary artery of native heart with angina pectoris (HCC)   3. Primary hypertension   4. Diabetes mellitus due to underlying condition with hyperosmolarity and coma, without  long-term current use of insulin (HCC)   5. Hyperlipidemia LDL goal <70   6. Tobacco use    PLAN:     I am concerned this patient is at very high risk for progression of his coronary artery disease he still is smoking, he had not been on lipid-lowering medication for many years, like to proceed with a left heart catheterization in this patient.  My concern is that even with  a negative stress test this patient is still high risk for progression false negative given his residual 30% left main and 40% LAD, 60% ostial to proximal ramus intermedius and previously occluded RCA .   The patient understands that risks include but are not limited to stroke (1 in 1000), death (1 in 1000), kidney failure [usually temporary] (1 in 500), bleeding (1 in 200), allergic reaction [possibly serious] (1 in 200), and agrees to proceed.  I will increase his Imdur to 60 mg daily, will continue his lisinopril, he is also on the beta-blocker Lopressor 50 mg daily.  Continue aspirin as he prefers to take [facility], and he has just been started on Repatha.  Smoking cessation discussed he is planning and has been on new medications to assist with this process. This is being managed by our pharmacist.  Diabetes mellitus this is being managed by his primary care doctor he is currently on glipizide and Metformin  The patient is in agreement with the above plan. The patient left the office in stable condition.  The patient will follow up in 2 weeks post catheterization.  Medication Adjustments/Labs and Tests Ordered: Current medicines are reviewed at length with the patient today.  Concerns regarding medicines are outlined above.  Orders Placed This Encounter  Procedures  . Basic metabolic panel  . CBC   Meds ordered this encounter  Medications  . isosorbide mononitrate (IMDUR) 60 MG 24 hr tablet    Sig: Take 1 tablet (60 mg total) by mouth daily.    Dispense:  90 tablet    Refill:  1    Patient Instructions    Medication Instructions:   Your physician has recommended you make the following change in your medication:  INCREASE: Imdure to 60 mg daily   *If you need a refill on your cardiac medications before your next appointment, please call your pharmacy*   Lab Work: Your physician recommends that you return for lab work today: bmp, cbc  If you have labs (blood work) drawn today and your tests are completely normal, you will receive your results only by: Marland Kitchen MyChart Message (if you have MyChart) OR . A paper copy in the mail If you have any lab test that is abnormal or we need to change your treatment, we will call you to review the results.   Testing/Procedures:    [facility] AT [place] [address] [place] [address] Dept: [phone] Loc: [phone]  AMANDA POTE  11/06/2019  You are scheduled for a Cardiac Catheterization on Tuesday, October 12 with Dr. Bryan Lemma.  1. Please arrive at the [facility] (Main Entrance A) at [facility]: [address] [place], [place] [address] at 5:30 AM (This time is two hours before your procedure to ensure your preparation). Free valet parking service is available.   Special note: Every effort is made to have your procedure done on time. Please understand that emergencies sometimes delay scheduled procedures.  2. Diet: Do not eat solid foods after midnight.  The patient may have clear liquids until 5am upon the day of the procedure.  3. Labs: You will have labs drawn today.  4. Medication instructions in preparation for your procedure:   Contrast Allergy: No    HOLD Glipizide on the day of the procedure   Do not take Diabetes Med Glucophage (Metformin) on the day of the procedure and HOLD 48 HOURS AFTER THE PROCEDURE.  On the  morning of your procedure, take your Aspirin and any morning medicines NOT listed above.  You may use sips of  water.  5. Plan for one night stay--bring personal belongings. 6. Bring a current list of your medications and current insurance cards. 7. You MUST have a responsible person to drive you home. 8. Someone MUST be with you the first 24 hours after you arrive home or your discharge will be delayed. 9. Please wear clothes that are easy to get on and off and wear slip-on shoes.  Thank you for allowing [place] to care for you!   -- [facility]    Follow-Up: At [facility], you and your health needs are our priority.  As part of our continuing mission to provide you with exceptional heart care, we have created designated Provider Care Teams.  These Care Teams include your primary Cardiologist (physician) and Advanced Practice Providers (APPs -  Physician Assistants and Nurse Practitioners) who all work together to provide you with the care you need, when you need it.  We recommend signing up for the patient portal called "MyChart".  Sign up information is provided on this After Visit Summary.  MyChart is used to connect with patients for Virtual Visits (Telemedicine).  Patients are able to view lab/test results, encounter notes, upcoming appointments, etc.  Non-urgent messages can be sent to your provider as well.   To learn more about what you can do with MyChart, go to [email or web address].    Your next appointment:   2 week(s)  The format for your next appointment:   In Person  Provider:   Thomasene Ripple, DO   Other Instructions   Coronary Angiogram With Stent Coronary angiogram with stent placement is a procedure to widen or open a narrow blood vessel of the heart (coronary artery). Arteries may become blocked by cholesterol buildup (plaques) in the lining of the artery wall. When a coronary artery becomes partially blocked, blood flow to that area decreases. This may lead to chest pain or a heart attack (myocardial infarction). A stent is a small piece  of metal that looks like mesh or spring. Stent placement may be done as treatment after a heart attack, or to prevent a heart attack if a blocked artery is found by a coronary angiogram. Let your health care provider know about:  Any allergies you have, including allergies to medicines or contrast dye.  All medicines you are taking, including vitamins, herbs, eye drops, creams, and over-the-counter medicines.  Any problems you or family members have had with anesthetic medicines.  Any blood disorders you have.  Any surgeries you have had.  Any medical conditions you have, including kidney problems or kidney failure.  Whether you are pregnant or may be pregnant.  Whether you are breastfeeding. What are the risks? Generally, this is a safe procedure. However, serious problems may occur, including:  Damage to nearby structures or organs, such as the heart, blood vessels, or kidneys.  A return of blockage.  Bleeding, infection, or bruising at the insertion site.  A collection of blood under the skin (hematoma) at the insertion site.  A blood clot in another part of the body.  Allergic reaction to medicines or dyes.  Bleeding into the abdomen (retroperitoneal bleeding).  Stroke (rare).  Heart attack (rare). What happens before the procedure? Staying hydrated Follow instructions from your health care provider about hydration, which may include:  Up to 2 hours before the procedure - you may continue to  drink clear liquids, such as water, clear fruit juice, black coffee, and plain tea.  Eating and drinking restrictions Follow instructions from your health care provider about eating and drinking, which may include:  8 hours before the procedure - stop eating heavy meals or foods, such as meat, fried foods, or fatty foods.  6 hours before the procedure - stop eating light meals or foods, such as toast or cereal.  2 hours before the procedure - stop drinking clear  liquids. Medicines Ask your health care provider about:  Changing or stopping your regular medicines. This is especially important if you are taking diabetes medicines or blood thinners.  Taking medicines such as aspirin and ibuprofen. These medicines can thin your blood. Do not take these medicines unless your health care provider tells you to take them. ? Generally, aspirin is recommended before a thin tube, called a catheter, is passed through a blood vessel and inserted into the heart (cardiac catheterization).  Taking over-the-counter medicines, vitamins, herbs, and supplements. General instructions  Do not use any products that contain nicotine or tobacco for at least 4 weeks before the procedure. These products include cigarettes, e-cigarettes, and chewing tobacco. If you need help quitting, ask your health care provider.  Plan to have someone take you home from the hospital or clinic.  If you will be going home right after the procedure, plan to have someone with you for 24 hours.  You may have tests and imaging procedures.  Ask your health care provider: ? How your insertion site will be marked. Ask which artery will be used for the procedure. ? What steps will be taken to help prevent infection. These may include:  Removing hair at the insertion site.  Washing skin with a germ-killing soap.  Taking antibiotic medicine. What happens during the procedure?   An IV will be inserted into one of your veins.  Electrodes may be placed on your chest to monitor your heart rate during the procedure.  You will be given one or more of the following: ? A medicine to help you relax (sedative). ? A medicine to numb the area (local anesthetic) for catheter insertion.  A small incision will be made for catheter insertion.  The catheter will be inserted into an artery using a guide wire. The location may be in your groin, your wrist, or the fold of your arm (near your elbow).  An  X-ray procedure (fluoroscopy) will be used to help guide the catheter to the opening of the heart arteries.  A dye will be injected into the catheter. X-rays will be taken. The dye helps to show where any narrowing or blockages are located in the arteries.  Tell your health care provider if you have chest pain or trouble breathing.  A tiny wire will be guided to the blocked spot, and a balloon will be inflated to make the artery wider.  The stent will be expanded to crush the plaques into the wall of the vessel. The stent will hold the area open and improve the blood flow. Most stents have a drug coating to reduce the risk of the stent narrowing over time.  The artery may be made wider using a drill, laser, or other tools that remove plaques.  The catheter will be removed when the blood flow improves. The stent will stay where it was placed, and the lining of the artery will grow over it.  A bandage (dressing) will be placed on the insertion site. Pressure will  be applied to stop bleeding.  The IV will be removed. This procedure may vary among health care providers and hospitals. What happens after the procedure?  Your blood pressure, heart rate, breathing rate, and blood oxygen level will be monitored until you leave the hospital or clinic.  If the procedure is done through the leg, you will lie flat in bed for a few hours or for as long as told by your health care provider. You will be instructed not to bend or cross your legs.  The insertion site and the pulse in your foot or wrist will be checked often.  You may have more blood tests, X-rays, and a test that records the electrical activity of your heart (electrocardiogram, or ECG).  Do not drive for 24 hours if you were given a sedative during your procedure. Summary  Coronary angiogram with stent placement is a procedure to widen or open a narrowed coronary artery. This is done to treat heart problems.  Before the procedure, let  your health care provider know about all the medical conditions and surgeries you have or have had.  This is a safe procedure. However, some problems may occur, including damage to nearby structures or organs, bleeding, blood clots, or allergies.  Follow your health care provider's instructions about eating, drinking, medicines, and other lifestyle changes, such as quitting tobacco use before the procedure. This information is not intended to replace advice given to you by your health care provider. Make sure you discuss any questions you have with your health care provider. Document Revised: 08/13/2018 Document Reviewed: 08/13/2018 Elsevier Patient Education  2020 [employer].      Adopting a Healthy Lifestyle.  Know what a healthy weight is for you (roughly BMI <25) and aim to maintain this   Aim for 7+ servings of fruits and vegetables daily   65-80+ fluid ounces of water or unsweet tea for healthy kidneys   Limit to max 1 drink of alcohol per day; avoid smoking/tobacco   Limit animal fats in diet for cholesterol and heart health - choose grass fed whenever available   Avoid highly processed foods, and foods high in saturated/trans fats   Aim for low stress - take time to unwind and care for your mental health   Aim for 150 min of moderate intensity exercise weekly for heart health, and weights twice weekly for bone health   Aim for 7-9 hours of sleep daily   When it comes to diets, agreement about the perfect plan isnt easy to find, even among the experts. Experts at the [facility] [employer] developed an idea known as the Healthy Eating Plate. Just imagine a plate divided into logical, healthy portions.   The emphasis is on diet quality:   Load up on vegetables and fruits - one-half of your plate: Aim for color and variety, and remember that potatoes dont count.   Go for whole grains - one-quarter of your plate: Whole wheat, barley, wheat berries, quinoa,  oats, brown rice, and foods made with them. If you want pasta, go with whole wheat pasta.   Protein power - one-quarter of your plate: Fish, chicken, beans, and nuts are all healthy, versatile protein sources. Limit red meat.   The diet, however, does go beyond the plate, offering a few other suggestions.   Use healthy plant oils, such as olive, canola, soy, corn, sunflower and peanut. Check the labels, and avoid partially hydrogenated oil, which have unhealthy trans fats.  If youre thirsty, drink water. Coffee and tea are good in moderation, but skip sugary drinks and limit milk and dairy products to one or two daily servings.   The type of carbohydrate in the diet is more important than the amount. Some sources of carbohydrates, such as vegetables, fruits, whole grains, and beans-are healthier than others.   Finally, stay active  Signed, Thomasene Ripple, DO  11/07/2019 9:29 AM    [facility]

## 2019-11-06 NOTE — Assessment & Plan Note (Signed)
Patient condones smoking 1 ppd, has for many years.  He states he's ready to quit.  Admits to having used Chantix in the past without success.  Discussed options of Zyban (bupropion) vs nicotine replacement (patches, gum, lozenges).  Would like to try Zyban.  Reviewed with patient to start 1 week prior to quit date and take 1 tablet daily for 3 days, then increase to 1 tablet twice daily for 3 months.

## 2019-11-07 LAB — CBC
Hematocrit: 31.4 % — ABNORMAL LOW (ref 37.5–51.0)
Hemoglobin: 8.4 g/dL — ABNORMAL LOW (ref 13.0–17.7)
MCH: 17.1 pg — ABNORMAL LOW (ref 26.6–33.0)
MCHC: 26.8 g/dL — ABNORMAL LOW (ref 31.5–35.7)
MCV: 64 fL — ABNORMAL LOW (ref 79–97)
Platelets: 325 10*3/uL (ref 150–450)
RBC: 4.91 x10E6/uL (ref 4.14–5.80)
RDW: 21.6 % — ABNORMAL HIGH (ref 11.6–15.4)
WBC: 9.6 10*3/uL (ref 3.4–10.8)

## 2019-11-07 LAB — BASIC METABOLIC PANEL
BUN/Creatinine Ratio: 17 (ref 9–20)
BUN: 17 mg/dL (ref 6–24)
CO2: 23 mmol/L (ref 20–29)
Calcium: 9.2 mg/dL (ref 8.7–10.2)
Chloride: 107 mmol/L — ABNORMAL HIGH (ref 96–106)
Creatinine, Ser: 1.01 mg/dL (ref 0.76–1.27)
GFR calc Af Amer: 94 mL/min/{1.73_m2} (ref >59)
GFR calc non Af Amer: 82 mL/min/{1.73_m2} (ref >59)
Glucose: 111 mg/dL — ABNORMAL HIGH (ref 65–99)
Potassium: 4.6 mmol/L (ref 3.5–5.2)
Sodium: 142 mmol/L (ref 134–144)

## 2019-11-07 NOTE — Addendum Note (Signed)
Addended by: Thomasene Ripple on: 11/07/2019 09:31 AM   Modules accepted: Orders, SmartSet

## 2019-11-10 ENCOUNTER — Telehealth: Payer: Self-pay

## 2019-11-10 DIAGNOSIS — D649 Anemia, unspecified: Secondary | ICD-10-CM

## 2019-11-10 NOTE — Telephone Encounter (Signed)
-----   Message from Thomasene Ripple, DO sent at 11/07/2019  9:38 AM EDT ----- Please let the patient know that his hemoglobin is 8.4, which compared to 3 years ago it was 12. I was able to look in the [facility] system and compare it. It was also low then. I want you to be evaluated by GI for bleeding. SO we will have to cancel your LHC and send an urgent GI referral.

## 2019-11-10 NOTE — Addendum Note (Signed)
Addended by: Delorse Limber I on: 11/10/2019 11:20 AM   Modules accepted: Orders

## 2019-11-10 NOTE — Telephone Encounter (Signed)
Spoke with patient regarding results and recommendation.  Patient verbalizes understanding and is agreeable to plan of care. Advised patient to call back with any issues or concerns.   Referral for GI was placed as an urgent referral at this time.

## 2019-11-11 ENCOUNTER — Telehealth: Payer: Self-pay | Admitting: Cardiology

## 2019-11-11 NOTE — Telephone Encounter (Signed)
Please let the patient know that I asked that he see GI first, he will need to be evaluated by GI with an endoscopy before we schedule his LHC. The low hemoglobin could be from a possible GI bleeding.  Please check with Lequita Halt about whether we were able to refer him to GI as she was also working with this.   We need to please him understand that the GI eval is important and without it we can not proceed with the LHC- as he could be having GI bleeding.

## 2019-11-11 NOTE — Telephone Encounter (Signed)
Pt called and said that he was supposed to have a procedure done but due to low hemogloblin was cancelled. York Spaniel that nobody has contacted him to reschedule or to see what's going on. Please call patient back

## 2019-11-11 NOTE — Telephone Encounter (Signed)
Please let the patient know that I asked that he see GI first, he will need to be evaluated by GI with an endoscopy before we schedule his LHC. The low hemoglobin could be from a possible GI bleeding.

## 2019-11-12 NOTE — Telephone Encounter (Signed)
Tried calling patient. No answer and no voicemail set up for me to leave a message. 

## 2019-11-14 ENCOUNTER — Other Ambulatory Visit (HOSPITAL_COMMUNITY): Payer: BLUE CROSS/BLUE SHIELD

## 2019-11-17 ENCOUNTER — Encounter (HOSPITAL_COMMUNITY): Payer: Self-pay

## 2019-11-17 ENCOUNTER — Ambulatory Visit (HOSPITAL_COMMUNITY): Admit: 2019-11-17 | Payer: BLUE CROSS/BLUE SHIELD | Admitting: Cardiology

## 2019-11-17 SURGERY — LEFT HEART CATH AND CORONARY ANGIOGRAPHY
Anesthesia: LOCAL

## 2019-11-17 NOTE — Telephone Encounter (Signed)
Left message for patient to return call.

## 2019-11-23 NOTE — Telephone Encounter (Signed)
Left message for patient to return call.

## 2019-11-24 ENCOUNTER — Encounter: Payer: Self-pay | Admitting: Cardiology

## 2019-11-24 ENCOUNTER — Ambulatory Visit: Payer: BLUE CROSS/BLUE SHIELD | Admitting: Cardiology

## 2019-11-24 ENCOUNTER — Other Ambulatory Visit: Payer: Self-pay

## 2019-11-24 VITALS — BP 134/66 | HR 76 | Ht 70.0 in | Wt 210.6 lb

## 2019-11-24 DIAGNOSIS — I25811 Atherosclerosis of native coronary artery of transplanted heart without angina pectoris: Secondary | ICD-10-CM

## 2019-11-24 DIAGNOSIS — D649 Anemia, unspecified: Secondary | ICD-10-CM | POA: Diagnosis not present

## 2019-11-24 DIAGNOSIS — R0602 Shortness of breath: Secondary | ICD-10-CM | POA: Diagnosis not present

## 2019-11-24 DIAGNOSIS — E0800 Diabetes mellitus due to underlying condition with hyperosmolarity without nonketotic hyperglycemic-hyperosmolar coma (NKHHC): Secondary | ICD-10-CM

## 2019-11-24 DIAGNOSIS — E785 Hyperlipidemia, unspecified: Secondary | ICD-10-CM

## 2019-11-24 NOTE — Patient Instructions (Signed)
Medication Instructions:  No medication changes. *If you need a refill on your cardiac medications before your next appointment, please call your pharmacy*   Lab Work: None ordered If you have labs (blood work) drawn today and your tests are completely normal, you will receive your results only by: Marland Kitchen MyChart Message (if you have MyChart) OR . A paper copy in the mail If you have any lab test that is abnormal or we need to change your treatment, we will call you to review the results.   Testing/Procedures: None ordered   Follow-Up: At [facility], you and your health needs are our priority.  As part of our continuing mission to provide you with exceptional heart care, we have created designated Provider Care Teams.  These Care Teams include your primary Cardiologist (physician) and Advanced Practice Providers (APPs -  Physician Assistants and Nurse Practitioners) who all work together to provide you with the care you need, when you need it.  We recommend signing up for the patient portal called "MyChart".  Sign up information is provided on this After Visit Summary.  MyChart is used to connect with patients for Virtual Visits (Telemedicine).  Patients are able to view lab/test results, encounter notes, upcoming appointments, etc.  Non-urgent messages can be sent to your provider as well.   To learn more about what you can do with MyChart, go to [email or web address].    Your next appointment:   3 month(s)  The format for your next appointment:   In Person  Provider:   Thomasene Ripple, DO   Other Instructions Pantoprazole tablets What is this medicine? PANTOPRAZOLE (pan TOE pra zole) prevents the production of acid in the stomach. It is used to treat gastroesophageal reflux disease (GERD), inflammation of the esophagus, and Zollinger-Ellison syndrome. This medicine may be used for other purposes; ask your health care provider or pharmacist if you have questions. COMMON BRAND  NAME(S): Protonix What should I tell my health care provider before I take this medicine? They need to know if you have any of these conditions:  liver disease  low levels of magnesium in the blood  lupus  an unusual or allergic reaction to omeprazole, lansoprazole, pantoprazole, rabeprazole, other medicines, foods, dyes, or preservatives  pregnant or trying to get pregnant  breast-feeding How should I use this medicine? Take this medicine by mouth. Swallow the tablets whole with a drink of water. Follow the directions on the prescription label. Do not crush, break, or chew. Take your medicine at regular intervals. Do not take your medicine more often than directed. Talk to your pediatrician regarding the use of this medicine in children. While this drug may be prescribed for children as young as 5 years for selected conditions, precautions do apply. Overdosage: If you think you have taken too much of this medicine contact a poison control center or emergency room at once. NOTE: This medicine is only for you. Do not share this medicine with others. What if I miss a dose? If you miss a dose, take it as soon as you can. If it is almost time for your next dose, take only that dose. Do not take double or extra doses. What may interact with this medicine? Do not take this medicine with any of the following medications:  atazanavir  nelfinavir This medicine may also interact with the following medications:  ampicillin  delavirdine  erlotinib  iron salts  medicines for fungal infections like ketoconazole, itraconazole and voriconazole  methotrexate  mycophenolate mofetil  warfarin This list may not describe all possible interactions. Give your health care provider a list of all the medicines, herbs, non-prescription drugs, or dietary supplements you use. Also tell them if you smoke, drink alcohol, or use illegal drugs. Some items may interact with your medicine. What should I  watch for while using this medicine? It can take several days before your stomach pain gets better. Check with your doctor or health care provider if your condition does not start to get better, or if it gets worse. This medicine may cause serious skin reactions. They can happen weeks to months after starting the medicine. Contact your health care provider right away if you notice fevers or flu-like symptoms with a rash. The rash may be red or purple and then turn into blisters or peeling of the skin. Or, you might notice a red rash with swelling of the face, lips or lymph nodes in your neck or under your arms. You may need blood work done while you are taking this medicine. This medicine may cause a decrease in vitamin B12. You should make sure that you get enough vitamin B12 while you are taking this medicine. Discuss the foods you eat and the vitamins you take with your health care provider. What side effects may I notice from receiving this medicine? Side effects that you should report to your doctor or health care professional as soon as possible:   allergic reactions like skin rash, itching or hives, swelling of the face, lips, or tongue   bone, muscle or joint pain   breathing problems   chest pain or chest tightness   dark yellow or brown urine   dizziness   fast, irregular heartbeat   feeling faint or lightheaded   fever or sore throat   muscle spasm   palpitations   rash on cheeks or arms that gets worse in the sun   redness, blistering, peeling or loosening of the skin, including inside the mouth   seizures  stomach polyps   tremors   unusual bleeding or bruising   unusually weak or tired   yellowing of the eyes or skin Side effects that usually do not require medical attention (report to your doctor or health care professional if they continue or are bothersome):   constipation   diarrhea   dry mouth   headache   nausea This  list may not describe all possible side effects. Call your doctor for medical advice about side effects. You may report side effects to FDA at 1-800-FDA-1088. Where should I keep my medicine? Keep out of the reach of children. Store at room temperature between 15 and 30 degrees C (59 and 86 degrees F). Protect from light and moisture. Throw away any unused medicine after the expiration date. NOTE: This sheet is a summary. It may not cover all possible information. If you have questions about this medicine, talk to your doctor, pharmacist, or health care provider.  2020 [employer]/Gold Standard (2018-05-02 13:18:32)

## 2019-11-24 NOTE — Progress Notes (Signed)
Cardiology Office Note:    Date:  11/24/2019   ID:  Vincent Guerrero, DOB 1961/01/23, MRN [id number]  PCP:  Simone Curia, MD  Cardiologist:  Thomasene Ripple, DO  Electrophysiologist:  None   Referring MD: Simone Curia, MD   " I am still short of breath"  History of Present Illness:    Vincent Guerrero is a 59 y.o. male with a hx of coronary artery disease status post stentin the RCAa result of an MI in 2010,hyperlipidemia (not on statins due to statin intolerance), diabetes mellitus (on Metformin and glipizide),, current smoker CVA in 2011, moderate aortic stenosis in April 2018.   When I last saw the patient he reported that he had been experiencing shortness of breath along with chest pain.  I was concerned I therefore placed the patient on Imdur and given his high risk for progression coronary artery disease I recommended that he get a left heart catheterization.  The patient had labs as part of planning for his heart catheterization which show a drop hemoglobin 8.4.  I have the office contact the patient to see GI for evaluation for endoscopy. Patient is here today for a visit unfortunately he had not been able to get this evaluation done.  He tells me the shortness of breath is worsening.  Past Medical History:  Diagnosis Date   Age-related nuclear cataract of both eyes 11/27/2018   Anemia    Bilateral recurrent inguinal hernia without obstruction or gangrene 05/09/2016   Added automatically from request for surgery [id number]   Chest pressure 11/06/2019   Coronary artery disease involving native artery of transplanted heart without angina pectoris    Cortical age-related cataract of both eyes 11/27/2018   Depression    Diabetes mellitus (HCC)    Hyperlipidemia LDL goal <70    Hypertension    Inguinal hernia without obstruction or gangrene    BILATERAL   Primary open angle glaucoma (POAG) of both eyes, severe stage 11/27/2018   Primary open angle glaucoma (POAG) of  left eye, severe stage 11/27/2018   Tobacco abuse 11/05/2019   Tobacco use 11/06/2019    Past Surgical History:  Procedure Laterality Date   HERNIA REPAIR  1998   LEFT HEART CATH AND CORONARY ANGIOGRAPHY N/A 06/04/2016   Procedure: Left Heart Cath and Coronary Angiography;  Surgeon: Lyn Records, MD;  Location: [facility] INVASIVE CV LAB;  Service: Cardiovascular;  Laterality: N/A;    Current Medications: Current Meds  Medication Sig   Aspirin-Salicylamide-Caffeine (BC HEADACHE POWDER PO) Take by mouth 3 (three) times daily.   brimonidine (ALPHAGAN) 0.2 % ophthalmic solution Place 1 drop into the right eye 2 (two) times daily.    buPROPion (WELLBUTRIN SR) 150 MG 12 hr tablet Take 150 mg by mouth 2 (two) times daily.   buPROPion (ZYBAN) 150 MG 12 hr tablet Start with 1 tablet daily for 3 days, then increase to 1 tablet twice daily.  Start 1 week before your quit date.   Difluprednate 0.05 % EMUL Apply 1 drop to eye in the morning, at noon, in the evening, and at bedtime.    Evolocumab with Infusor (REPATHA PUSHTRONEX SYSTEM) 420 MG/3.5ML SOCT Inject 420 mg into the skin every 30 (thirty) days.   glipiZIDE (GLUCOTROL) 10 MG tablet Take 10 mg by mouth in the morning.   isosorbide mononitrate (IMDUR) 60 MG 24 hr tablet Take 1 tablet (60 mg total) by mouth daily.   lisinopril (PRINIVIL,ZESTRIL) 20 MG tablet Take 1 tablet (20  mg total) by mouth 2 (two) times daily.   metFORMIN (GLUCOPHAGE) 1000 MG tablet Take 1,000 mg by mouth 2 (two) times daily with a meal.   metoprolol (LOPRESSOR) 50 MG tablet Take 1 tablet (50 mg total) by mouth 2 (two) times daily.   Netarsudil-Latanoprost (ROCKLATAN) 0.02-0.005 % SOLN Place 1 drop into the right eye at bedtime.    potassium chloride SA (KLOR-CON M20) 20 MEQ tablet Take 1 tab (20 meq) on Tues,Thurs,and Saturdays     Allergies:   Patient has no known allergies.   Social History   Socioeconomic History   Marital status: Legally Separated     Spouse name: Not on file   Number of children: Not on file   Years of education: Not on file   Highest education level: Not on file  Occupational History   Not on file  Tobacco Use   Smoking status: Current Every Day Smoker    Packs/day: 1.00    Types: Cigarettes   Smokeless tobacco: Current User    Types: Chew  Vaping Use   Vaping Use: Never used  Substance and Sexual Activity   Alcohol use: No   Drug use: Yes    Types: Marijuana   Sexual activity: Yes  Other Topics Concern   Not on file  Social History Narrative   Not on file   Social Determinants of Health   Financial Resource Strain:    Difficulty of Paying Living Expenses: Not on file  Food Insecurity:    Worried About Running Out of Food in the Last Year: Not on file   Ran Out of Food in the Last Year: Not on file  Transportation Needs:    Lack of Transportation (Medical): Not on file   Lack of Transportation (Non-Medical): Not on file  Physical Activity:    Days of Exercise per Week: Not on file   Minutes of Exercise per Session: Not on file  Stress:    Feeling of Stress : Not on file  Social Connections:    Frequency of Communication with Friends and Family: Not on file   Frequency of Social Gatherings with Friends and Family: Not on file   Attends Religious Services: Not on file   Active Member of Clubs or Organizations: Not on file   Attends Banker Meetings: Not on file   Marital Status: Not on file     Family History: The patient's family history includes CAD in his father; Diabetes in his father; Heart failure in his mother; Hypertension in his mother; Prostate cancer in his brother.  ROS:   Review of Systems  Constitution: Negative for decreased appetite, fever and weight gain.  HENT: Negative for congestion, ear discharge, hoarse voice and sore throat.   Eyes: Negative for discharge, redness, vision loss in right eye and visual halos.  Cardiovascular:  Negative for chest pain, dyspnea on exertion, leg swelling, orthopnea and palpitations.  Respiratory: Negative for cough, hemoptysis, shortness of breath and snoring.   Endocrine: Negative for heat intolerance and polyphagia.  Hematologic/Lymphatic: Negative for bleeding problem. Does not bruise/bleed easily.  Skin: Negative for flushing, nail changes, rash and suspicious lesions.  Musculoskeletal: Negative for arthritis, joint pain, muscle cramps, myalgias, neck pain and stiffness.  Gastrointestinal: Negative for abdominal pain, bowel incontinence, diarrhea and excessive appetite.  Genitourinary: Negative for decreased libido, genital sores and incomplete emptying.  Neurological: Negative for brief paralysis, focal weakness, headaches and loss of balance.  Psychiatric/Behavioral: Negative for altered mental status, depression  and suicidal ideas.  Allergic/Immunologic: Negative for HIV exposure and persistent infections.    EKGs/Labs/Other Studies Reviewed:    The following studies were reviewed today:   EKG: None today  Recent Labs: 12/10/2018: Magnesium 1.8; TSH 0.585 11/06/2019: BUN 17; Creatinine, Ser 1.01; Hemoglobin 8.4; Platelets 325; Potassium 4.6; Sodium 142  Recent Lipid Panel    Component Value Date/Time   CHOL 172 12/10/2018 1058   TRIG 165 (H) 12/10/2018 1058   HDL 43 12/10/2018 1058   CHOLHDL 4.0 12/10/2018 1058   LDLCALC 100 (H) 12/10/2018 1058    Physical Exam:    VS:  BP 134/66    Pulse 76    Ht  (1.778 m)    Wt 210 lb 9.6 oz (95.5 kg)    SpO2 96%    BMI 30.22 kg/m     Wt Readings from Last 3 Encounters:  11/24/19 210 lb 9.6 oz (95.5 kg)  11/06/19 205 lb (93 kg)  11/05/19 208 lb (94.3 kg)     GEN: Well nourished, well developed in no acute distress HEENT: Normal NECK: No JVD; No carotid bruits LYMPHATICS: No lymphadenopathy CARDIAC: S1S2 noted,RRR, no murmurs, rubs, gallops RESPIRATORY:  Clear to auscultation without rales, wheezing or rhonchi   ABDOMEN: Soft, non-tender, non-distended, +bowel sounds, no guarding. EXTREMITIES: No edema, No cyanosis, no clubbing MUSCULOSKELETAL:  No deformity  SKIN: Warm and dry NEUROLOGIC:  Alert and oriented x 3, non-focal PSYCHIATRIC:  Normal affect, good insight  ASSESSMENT:    1. Coronary artery disease involving native artery of transplanted heart without angina pectoris   2. Shortness of breath   3. Anemia   4. Diabetes mellitus due to underlying condition with hyperosmolarity without coma, without long-term current use of insulin (HCC)   5. Hyperlipidemia LDL goal <70    PLAN:     I am worried that symptomatic anemia could be contributing to his worsening shortness of breath.  He also does have coronary artery disease so I am keeping this in mind.  However he does need an urgent GI work-up we will going to get blood work today to make sure that his hemoglobin is not dropping further.  If there is any further decrease in his hemoglobin I discussed with the patient he would have to go to the emergency department.  In the meantime I am going to start the patient on Protonix 40 mg daily.  I have urged him to see his PCP as well as set up there is urgent referral for GI that we are requesting at this time.  His blood pressure is acceptable in the office today. He will continue on his current dose of antianginals, he tells me the shortness of breath is more bothersome than the chest pain at this time.  We will continue the patient on her Repatha.  The patient is in agreement with the above plan. The patient left the office in stable condition.  The patient will follow up in 3 months or sooner if needed.   Medication Adjustments/Labs and Tests Ordered: Current medicines are reviewed at length with the patient today.  Concerns regarding medicines are outlined above.  Orders Placed This Encounter  Procedures   Basic metabolic panel   CBC with Differential/Platelet   Magnesium    Ambulatory referral to Gastroenterology   No orders of the defined types were placed in this encounter.   Patient Instructions  Medication Instructions:  No medication changes. *If you need a refill on your cardiac medications  before your next appointment, please call your pharmacy*   Lab Work: None ordered If you have labs (blood work) drawn today and your tests are completely normal, you will receive your results only by:  MyChart Message (if you have MyChart) OR  A paper copy in the mail If you have any lab test that is abnormal or we need to change your treatment, we will call you to review the results.   Testing/Procedures: None ordered   Follow-Up: At [facility], you and your health needs are our priority.  As part of our continuing mission to provide you with exceptional heart care, we have created designated Provider Care Teams.  These Care Teams include your primary Cardiologist (physician) and Advanced Practice Providers (APPs -  Physician Assistants and Nurse Practitioners) who all work together to provide you with the care you need, when you need it.  We recommend signing up for the patient portal called "MyChart".  Sign up information is provided on this After Visit Summary.  MyChart is used to connect with patients for Virtual Visits (Telemedicine).  Patients are able to view lab/test results, encounter notes, upcoming appointments, etc.  Non-urgent messages can be sent to your provider as well.   To learn more about what you can do with MyChart, go to [email or web address].    Your next appointment:   3 month(s)  The format for your next appointment:   In Person  Provider:   Thomasene RippleKardie Loreto Loescher, DO   Other Instructions Pantoprazole tablets What is this medicine? PANTOPRAZOLE (pan TOE pra zole) prevents the production of acid in the stomach. It is used to treat gastroesophageal reflux disease (GERD), inflammation of the esophagus, and Zollinger-Ellison  syndrome. This medicine may be used for other purposes; ask your health care provider or pharmacist if you have questions. COMMON BRAND NAME(S): Protonix What should I tell my health care provider before I take this medicine? They need to know if you have any of these conditions:  liver disease  low levels of magnesium in the blood  lupus  an unusual or allergic reaction to omeprazole, lansoprazole, pantoprazole, rabeprazole, other medicines, foods, dyes, or preservatives  pregnant or trying to get pregnant  breast-feeding How should I use this medicine? Take this medicine by mouth. Swallow the tablets whole with a drink of water. Follow the directions on the prescription label. Do not crush, break, or chew. Take your medicine at regular intervals. Do not take your medicine more often than directed. Talk to your pediatrician regarding the use of this medicine in children. While this drug may be prescribed for children as young as 5 years for selected conditions, precautions do apply. Overdosage: If you think you have taken too much of this medicine contact a poison control center or emergency room at once. NOTE: This medicine is only for you. Do not share this medicine with others. What if I miss a dose? If you miss a dose, take it as soon as you can. If it is almost time for your next dose, take only that dose. Do not take double or extra doses. What may interact with this medicine? Do not take this medicine with any of the following medications:  atazanavir  nelfinavir This medicine may also interact with the following medications:  ampicillin  delavirdine  erlotinib  iron salts  medicines for fungal infections like ketoconazole, itraconazole and voriconazole  methotrexate  mycophenolate mofetil  warfarin This list may not describe all possible interactions. Give your health care provider  a list of all the medicines, herbs, non-prescription drugs, or dietary  supplements you use. Also tell them if you smoke, drink alcohol, or use illegal drugs. Some items may interact with your medicine. What should I watch for while using this medicine? It can take several days before your stomach pain gets better. Check with your doctor or health care provider if your condition does not start to get better, or if it gets worse. This medicine may cause serious skin reactions. They can happen weeks to months after starting the medicine. Contact your health care provider right away if you notice fevers or flu-like symptoms with a rash. The rash may be red or purple and then turn into blisters or peeling of the skin. Or, you might notice a red rash with swelling of the face, lips or lymph nodes in your neck or under your arms. You may need blood work done while you are taking this medicine. This medicine may cause a decrease in vitamin B12. You should make sure that you get enough vitamin B12 while you are taking this medicine. Discuss the foods you eat and the vitamins you take with your health care provider. What side effects may I notice from receiving this medicine? Side effects that you should report to your doctor or health care professional as soon as possible:   allergic reactions like skin rash, itching or hives, swelling of the face, lips, or tongue   bone, muscle or joint pain   breathing problems   chest pain or chest tightness   dark yellow or brown urine   dizziness   fast, irregular heartbeat   feeling faint or lightheaded   fever or sore throat   muscle spasm   palpitations   rash on cheeks or arms that gets worse in the sun   redness, blistering, peeling or loosening of the skin, including inside the mouth   seizures  stomach polyps   tremors   unusual bleeding or bruising   unusually weak or tired   yellowing of the eyes or skin Side effects that usually do not require medical attention (report to your doctor  or health care professional if they continue or are bothersome):   constipation   diarrhea   dry mouth   headache   nausea This list may not describe all possible side effects. Call your doctor for medical advice about side effects. You may report side effects to FDA at 1-800-FDA-1088. Where should I keep my medicine? Keep out of the reach of children. Store at room temperature between 15 and 30 degrees C (59 and 86 degrees F). Protect from light and moisture. Throw away any unused medicine after the expiration date. NOTE: This sheet is a summary. It may not cover all possible information. If you have questions about this medicine, talk to your doctor, pharmacist, or health care provider.  2020 Elsevier/Gold Standard (2018-05-02 13:18:32)      Adopting a Healthy Lifestyle.  Know what a healthy weight is for you (roughly BMI <25) and aim to maintain this   Aim for 7+ servings of fruits and vegetables daily   65-80+ fluid ounces of water or unsweet tea for healthy kidneys   Limit to max 1 drink of alcohol per day; avoid smoking/tobacco   Limit animal fats in diet for cholesterol and heart health - choose grass fed whenever available   Avoid highly processed foods, and foods high in saturated/trans fats   Aim for low stress - take time  to unwind and care for your mental health   Aim for 150 min of moderate intensity exercise weekly for heart health, and weights twice weekly for bone health   Aim for 7-9 hours of sleep daily   When it comes to diets, agreement about the perfect plan isnt easy to find, even among the experts. Experts at the [facility] of [employer] developed an idea known as the Healthy Eating Plate. Just imagine a plate divided into logical, healthy portions.   The emphasis is on diet quality:   Load up on vegetables and fruits - one-half of your plate: Aim for color and variety, and remember that potatoes dont count.   Go for whole grains -  one-quarter of your plate: Whole wheat, barley, wheat berries, quinoa, oats, brown rice, and foods made with them. If you want pasta, go with whole wheat pasta.   Protein power - one-quarter of your plate: Fish, chicken, beans, and nuts are all healthy, versatile protein sources. Limit red meat.   The diet, however, does go beyond the plate, offering a few other suggestions.   Use healthy plant oils, such as olive, canola, soy, corn, sunflower and peanut. Check the labels, and avoid partially hydrogenated oil, which have unhealthy trans fats.   If youre thirsty, drink water. Coffee and tea are good in moderation, but skip sugary drinks and limit milk and dairy products to one or two daily servings.   The type of carbohydrate in the diet is more important than the amount. Some sources of carbohydrates, such as vegetables, fruits, whole grains, and beans-are healthier than others.   Finally, stay active  Signed, Thomasene Ripple, DO  11/24/2019 4:01 PM    [facility]

## 2019-11-25 ENCOUNTER — Inpatient Hospital Stay (HOSPITAL_COMMUNITY)
Admission: EM | Admit: 2019-11-25 | Discharge: 2019-11-28 | DRG: 379 | Disposition: A | Discharge status: Home / Self Care | Payer: BLUE CROSS/BLUE SHIELD | Attending: Internal Medicine | Admitting: Internal Medicine

## 2019-11-25 ENCOUNTER — Other Ambulatory Visit: Payer: Self-pay

## 2019-11-25 ENCOUNTER — Encounter (HOSPITAL_COMMUNITY): Payer: Self-pay | Admitting: Emergency Medicine

## 2019-11-25 ENCOUNTER — Emergency Department (HOSPITAL_COMMUNITY): Payer: BLUE CROSS/BLUE SHIELD

## 2019-11-25 DIAGNOSIS — I350 Nonrheumatic aortic (valve) stenosis: Secondary | ICD-10-CM | POA: Diagnosis not present

## 2019-11-25 DIAGNOSIS — I480 Paroxysmal atrial fibrillation: Secondary | ICD-10-CM | POA: Diagnosis present

## 2019-11-25 DIAGNOSIS — Z7984 Long term (current) use of oral hypoglycemic drugs: Secondary | ICD-10-CM

## 2019-11-25 DIAGNOSIS — D500 Iron deficiency anemia secondary to blood loss (chronic): Secondary | ICD-10-CM | POA: Diagnosis present

## 2019-11-25 DIAGNOSIS — F1290 Cannabis use, unspecified, uncomplicated: Secondary | ICD-10-CM | POA: Diagnosis present

## 2019-11-25 DIAGNOSIS — D539 Nutritional anemia, unspecified: Secondary | ICD-10-CM | POA: Diagnosis present

## 2019-11-25 DIAGNOSIS — I4891 Unspecified atrial fibrillation: Secondary | ICD-10-CM

## 2019-11-25 DIAGNOSIS — Z791 Long term (current) use of non-steroidal anti-inflammatories (NSAID): Secondary | ICD-10-CM

## 2019-11-25 DIAGNOSIS — Z8042 Family history of malignant neoplasm of prostate: Secondary | ICD-10-CM

## 2019-11-25 DIAGNOSIS — Z833 Family history of diabetes mellitus: Secondary | ICD-10-CM

## 2019-11-25 DIAGNOSIS — R195 Other fecal abnormalities: Secondary | ICD-10-CM | POA: Diagnosis present

## 2019-11-25 DIAGNOSIS — Z79899 Other long term (current) drug therapy: Secondary | ICD-10-CM

## 2019-11-25 DIAGNOSIS — I252 Old myocardial infarction: Secondary | ICD-10-CM

## 2019-11-25 DIAGNOSIS — Z23 Encounter for immunization: Secondary | ICD-10-CM

## 2019-11-25 DIAGNOSIS — K219 Gastro-esophageal reflux disease without esophagitis: Secondary | ICD-10-CM | POA: Diagnosis present

## 2019-11-25 DIAGNOSIS — R609 Edema, unspecified: Secondary | ICD-10-CM | POA: Diagnosis present

## 2019-11-25 DIAGNOSIS — H25813 Combined forms of age-related cataract, bilateral: Secondary | ICD-10-CM | POA: Diagnosis present

## 2019-11-25 DIAGNOSIS — H401133 Primary open-angle glaucoma, bilateral, severe stage: Secondary | ICD-10-CM | POA: Diagnosis present

## 2019-11-25 DIAGNOSIS — D649 Anemia, unspecified: Secondary | ICD-10-CM

## 2019-11-25 DIAGNOSIS — I25811 Atherosclerosis of native coronary artery of transplanted heart without angina pectoris: Secondary | ICD-10-CM | POA: Diagnosis present

## 2019-11-25 DIAGNOSIS — H2513 Age-related nuclear cataract, bilateral: Secondary | ICD-10-CM | POA: Diagnosis present

## 2019-11-25 DIAGNOSIS — G8929 Other chronic pain: Secondary | ICD-10-CM | POA: Diagnosis present

## 2019-11-25 DIAGNOSIS — Z8249 Family history of ischemic heart disease and other diseases of the circulatory system: Secondary | ICD-10-CM

## 2019-11-25 DIAGNOSIS — Z20822 Contact with and (suspected) exposure to covid-19: Secondary | ICD-10-CM | POA: Diagnosis present

## 2019-11-25 DIAGNOSIS — Z955 Presence of coronary angioplasty implant and graft: Secondary | ICD-10-CM

## 2019-11-25 DIAGNOSIS — I10 Essential (primary) hypertension: Secondary | ICD-10-CM | POA: Diagnosis present

## 2019-11-25 DIAGNOSIS — E119 Type 2 diabetes mellitus without complications: Secondary | ICD-10-CM

## 2019-11-25 DIAGNOSIS — R0989 Other specified symptoms and signs involving the circulatory and respiratory systems: Secondary | ICD-10-CM | POA: Diagnosis present

## 2019-11-25 DIAGNOSIS — M549 Dorsalgia, unspecified: Secondary | ICD-10-CM | POA: Diagnosis present

## 2019-11-25 DIAGNOSIS — E1136 Type 2 diabetes mellitus with diabetic cataract: Secondary | ICD-10-CM | POA: Diagnosis present

## 2019-11-25 DIAGNOSIS — F32A Depression, unspecified: Secondary | ICD-10-CM | POA: Diagnosis present

## 2019-11-25 DIAGNOSIS — E785 Hyperlipidemia, unspecified: Secondary | ICD-10-CM | POA: Diagnosis present

## 2019-11-25 DIAGNOSIS — R079 Chest pain, unspecified: Secondary | ICD-10-CM | POA: Diagnosis not present

## 2019-11-25 DIAGNOSIS — I2510 Atherosclerotic heart disease of native coronary artery without angina pectoris: Secondary | ICD-10-CM | POA: Diagnosis present

## 2019-11-25 DIAGNOSIS — R252 Cramp and spasm: Secondary | ICD-10-CM | POA: Diagnosis present

## 2019-11-25 DIAGNOSIS — K254 Chronic or unspecified gastric ulcer with hemorrhage: Principal | ICD-10-CM | POA: Diagnosis present

## 2019-11-25 DIAGNOSIS — Z8673 Personal history of transient ischemic attack (TIA), and cerebral infarction without residual deficits: Secondary | ICD-10-CM

## 2019-11-25 DIAGNOSIS — F17210 Nicotine dependence, cigarettes, uncomplicated: Secondary | ICD-10-CM | POA: Diagnosis present

## 2019-11-25 DIAGNOSIS — Z720 Tobacco use: Secondary | ICD-10-CM

## 2019-11-25 DIAGNOSIS — Z7982 Long term (current) use of aspirin: Secondary | ICD-10-CM

## 2019-11-25 DIAGNOSIS — R42 Dizziness and giddiness: Secondary | ICD-10-CM | POA: Diagnosis present

## 2019-11-25 DIAGNOSIS — K4020 Bilateral inguinal hernia, without obstruction or gangrene, not specified as recurrent: Secondary | ICD-10-CM | POA: Diagnosis present

## 2019-11-25 LAB — CBC
HCT: 29.4 % — ABNORMAL LOW (ref 39.0–52.0)
Hemoglobin: 7.8 g/dL — ABNORMAL LOW (ref 13.0–17.0)
MCH: 17 pg — ABNORMAL LOW (ref 26.0–34.0)
MCHC: 26.5 g/dL — ABNORMAL LOW (ref 30.0–36.0)
MCV: 63.9 fL — ABNORMAL LOW (ref 80.0–100.0)
Platelets: 279 10*3/uL (ref 150–400)
RBC: 4.6 MIL/uL (ref 4.22–5.81)
RDW: 21.6 % — ABNORMAL HIGH (ref 11.5–15.5)
WBC: 9.7 10*3/uL (ref 4.0–10.5)
nRBC: 0 % (ref 0.0–0.2)

## 2019-11-25 LAB — CBC WITH DIFFERENTIAL/PLATELET
Basophils Absolute: 0 10*3/uL (ref 0.0–0.2)
Basos: 0 %
EOS (ABSOLUTE): 0.1 10*3/uL (ref 0.0–0.4)
Eos: 1 %
Hematocrit: 28.4 % — ABNORMAL LOW (ref 37.5–51.0)
Hemoglobin: 7.9 g/dL — ABNORMAL LOW (ref 13.0–17.7)
Immature Grans (Abs): 0 10*3/uL (ref 0.0–0.1)
Immature Granulocytes: 0 %
Lymphocytes Absolute: 2.9 10*3/uL (ref 0.7–3.1)
Lymphs: 29 %
MCH: 17.4 pg — ABNORMAL LOW (ref 26.6–33.0)
MCHC: 27.8 g/dL — ABNORMAL LOW (ref 31.5–35.7)
MCV: 63 fL — ABNORMAL LOW (ref 79–97)
Monocytes Absolute: 1 10*3/uL — ABNORMAL HIGH (ref 0.1–0.9)
Monocytes: 10 %
Neutrophils Absolute: 6 10*3/uL (ref 1.4–7.0)
Neutrophils: 60 %
Platelets: 303 10*3/uL (ref 150–450)
RBC: 4.54 x10E6/uL (ref 4.14–5.80)
RDW: 20.1 % — ABNORMAL HIGH (ref 11.6–15.4)
WBC: 10.1 10*3/uL (ref 3.4–10.8)

## 2019-11-25 LAB — BASIC METABOLIC PANEL
Anion gap: 9 (ref 5–15)
BUN/Creatinine Ratio: 16 (ref 9–20)
BUN: 16 mg/dL (ref 6–24)
BUN: 16 mg/dL (ref 6–20)
CO2: 22 mmol/L (ref 20–29)
CO2: 24 mmol/L (ref 22–32)
Calcium: 9 mg/dL (ref 8.7–10.2)
Calcium: 8.9 mg/dL (ref 8.9–10.3)
Chloride: 104 mmol/L (ref 96–106)
Chloride: 105 mmol/L (ref 98–111)
Creatinine, Ser: 0.99 mg/dL (ref 0.76–1.27)
Creatinine, Ser: 1.19 mg/dL (ref 0.61–1.24)
GFR calc Af Amer: 96 mL/min/{1.73_m2} (ref >59)
GFR calc non Af Amer: 83 mL/min/{1.73_m2} (ref >59)
GFR, Estimated: >60 mL/min (ref >60)
Glucose, Bld: 200 mg/dL — ABNORMAL HIGH (ref 70–99)
Glucose: 238 mg/dL — ABNORMAL HIGH (ref 65–99)
Potassium: 4.5 mmol/L (ref 3.5–5.2)
Potassium: 4.2 mmol/L (ref 3.5–5.1)
Sodium: 140 mmol/L (ref 134–144)
Sodium: 138 mmol/L (ref 135–145)

## 2019-11-25 LAB — ABO/RH: ABO/RH(D): B POS

## 2019-11-25 LAB — CBG MONITORING, ED: Glucose-Capillary: 97 mg/dL (ref 70–99)

## 2019-11-25 LAB — TROPONIN I (HIGH SENSITIVITY): Troponin I (High Sensitivity): 7 ng/L (ref <18)

## 2019-11-25 LAB — RESPIRATORY PANEL BY RT PCR (FLU A&B, COVID)
Influenza A by PCR: NEGATIVE
Influenza B by PCR: NEGATIVE
SARS Coronavirus 2 by RT PCR: NEGATIVE

## 2019-11-25 LAB — PREPARE RBC (CROSSMATCH)

## 2019-11-25 LAB — MAGNESIUM: Magnesium: 1.5 mg/dL — ABNORMAL LOW (ref 1.6–2.3)

## 2019-11-25 LAB — POC OCCULT BLOOD, ED: Fecal Occult Bld: NEGATIVE

## 2019-11-25 MED ORDER — ACETAMINOPHEN 650 MG RE SUPP: 650.0000 mg | Freq: Four times a day (QID) | RECTAL | Status: DC | PRN

## 2019-11-25 MED ORDER — INSULIN ASPART 100 UNIT/ML ~~LOC~~ SOLN
0.0000 [IU] | Freq: Three times a day (TID) | SUBCUTANEOUS | Status: DC
  Administered 2019-11-26 (×2): 2 [IU] via SUBCUTANEOUS
  Administered 2019-11-26: 3 [IU] via SUBCUTANEOUS
  Administered 2019-11-27: 1 [IU] via SUBCUTANEOUS
  Administered 2019-11-27 – 2019-11-28 (×2): 2 [IU] via SUBCUTANEOUS

## 2019-11-25 MED ORDER — METOPROLOL TARTRATE 50 MG PO TABS
50.0000 mg | ORAL_TABLET | Freq: Two times a day (BID) | ORAL | Status: DC
  Administered 2019-11-26 – 2019-11-28 (×5): 50 mg via ORAL
  Filled 2019-11-25 (×2): qty 1
  Filled 2019-11-25: qty 2
  Filled 2019-11-25 (×2): qty 1

## 2019-11-25 MED ORDER — LIDOCAINE 5 % EX PTCH
2.0000 | MEDICATED_PATCH | CUTANEOUS | Status: DC
  Administered 2019-11-25 – 2019-11-27 (×3): 2 via TRANSDERMAL
  Filled 2019-11-25 (×3): qty 2

## 2019-11-25 MED ORDER — ISOSORBIDE MONONITRATE ER 60 MG PO TB24
60.0000 mg | ORAL_TABLET | Freq: Every day | ORAL | Status: DC
  Administered 2019-11-26 – 2019-11-28 (×3): 60 mg via ORAL
  Filled 2019-11-25: qty 2
  Filled 2019-11-25 (×2): qty 1

## 2019-11-25 MED ORDER — PANTOPRAZOLE SODIUM 40 MG PO TBEC
40.0000 mg | DELAYED_RELEASE_TABLET | Freq: Two times a day (BID) | ORAL | Status: DC
  Administered 2019-11-25 – 2019-11-28 (×6): 40 mg via ORAL
  Filled 2019-11-25 (×6): qty 1

## 2019-11-25 MED ORDER — LISINOPRIL 20 MG PO TABS
20.0000 mg | ORAL_TABLET | Freq: Two times a day (BID) | ORAL | Status: DC
  Administered 2019-11-26 – 2019-11-28 (×5): 20 mg via ORAL
  Filled 2019-11-25 (×5): qty 1

## 2019-11-25 MED ORDER — SODIUM CHLORIDE 0.9% IV SOLUTION: Freq: Once | INTRAVENOUS | Status: DC

## 2019-11-25 MED ORDER — ACETAMINOPHEN 325 MG PO TABS
650.0000 mg | ORAL_TABLET | Freq: Four times a day (QID) | ORAL | Status: DC | PRN
  Filled 2019-11-25: qty 2

## 2019-11-25 MED ORDER — NETARSUDIL-LATANOPROST 0.02-0.005 % OP SOLN: 1.0000 [drp] | Freq: Every day | OPHTHALMIC | Status: DC

## 2019-11-25 MED ORDER — ENOXAPARIN SODIUM 40 MG/0.4ML ~~LOC~~ SOLN: 40.0000 mg | SUBCUTANEOUS | Status: DC

## 2019-11-25 NOTE — H&P (Addendum)
Date: 11/25/2019               Patient Name:  Vincent Guerrero MRN: [id number]  DOB: Jun 06, 1960 Age / Sex: 59 y.o., male   PCP: Simone Curia, MD         Medical Service: Internal Medicine Teaching Service         Attending Physician: Dr. Mikey Bussing    First Contact: Dr. Claudette Laws Pager: [phone]  Second Contact: Dr. Sande Brothers Pager: [phone]       After Hours (After 5p/  First Contact Pager: [phone]  weekends / holidays): Second Contact Pager: [phone]   Chief Complaint: dyspnea on exertion, anemia  History of Present Illness:   Vincent Guerrero is a 59 y.o. year old male with hx of CAD s/p RCA stent, MI in 2010, moderate aortic stenosis, CVA, HTN, DM, HLD, tobacco use presenting for increasing dyspnea on exertion and anemia. Was seen by his cardiologist today for regular follow up, they noted hgb of 7.8 and sent him to the ED. He states this DOE has going on for the past year, worse over the past few months. Has also had associated fatigue, generalized weakness, weight loss, night sweats, and muscle cramping for the past yer as well. Per our EMR, weights 205lb today, was 198lb in November 2020, and 218lb in April 2018. Has a hx of paroxysmal A fib but is not on anticoagulation, he is unsure why. Was taking BC Powder excessively, roughly 6-8 per day until last month when he stopped due to abdominal pain. Has not had a colonoscopy. Has been referred to GI in the past, but never made it. He has been avoiding it due to expected discomfort. Has not noticed any source of bleeding.  Reports history of PAF for the past 20 years. Notes that roughly 3 weeks, there was a period of 5 days when he had palpitations every night when he lay down. On one occasion, he also had substernal CP and diaphoresis. He went to [facility] ED and reports a benign workup aside from anemia, but he is unsure of specifics besides an EKG. Has not had palpitations since then.   ED course: 2 units of pRBCs  administered  Meds:  Current Outpatient Medications  Medication Instructions  . Aspirin-Salicylamide-Caffeine (BC HEADACHE POWDER PO) 3-6 packets, Oral, See admin instructions, Dissolve 3-6 packets orally daily  . brimonidine (ALPHAGAN) 0.2 % ophthalmic solution 1 drop, 2 times daily  . buPROPion (ZYBAN) 150 MG 12 hr tablet Start with 1 tablet daily for 3 days, then increase to 1 tablet twice daily.  Start 1 week before your quit date.  . Difluprednate 0.05 % EMUL 1 drop, 4 times daily  . glipiZIDE (GLUCOTROL) 10 mg, Oral, 2 times daily before meals  . isosorbide mononitrate (IMDUR) 60 mg, Oral, Daily  . isosorbide mononitrate (IMDUR) 60 mg, Oral, Daily  . lisinopril (ZESTRIL) 20 mg, Oral, 2 times daily  . metFORMIN (GLUCOPHAGE) 1,000 mg, Oral, 2 times daily with meals  . metoprolol tartrate (LOPRESSOR) 50 mg, Oral, 2 times daily  . Multiple Vitamins-Minerals (CENTRUM SILVER 50+MEN) TABS 1 tablet, Oral, Daily with breakfast  . Netarsudil-Latanoprost (ROCKLATAN) 0.02-0.005 % SOLN 1 drop, Right Eye, Nightly  . potassium chloride SA (KLOR-CON M20) 20 MEQ tablet Take 1 tab (20 meq) on Tues,Thurs,and Saturdays  . POTASSIUM PO 1 tablet, Oral, 3 times weekly  . Repatha Pushtronex System 420 mg, Subcutaneous, Every 30 days     Allergies: Allergies as  of 11/25/2019  . (No Known Allergies)   Past Medical History:  Diagnosis Date  . Age-related nuclear cataract of both eyes 11/27/2018  . Anemia   . Bilateral recurrent inguinal hernia without obstruction or gangrene 05/09/2016   Added automatically from request for surgery [id number]  . Chest pressure 11/06/2019  . Coronary artery disease involving native artery of transplanted heart without angina pectoris   . Cortical age-related cataract of both eyes 11/27/2018  . Depression   . Diabetes mellitus (HCC)   . Hyperlipidemia LDL goal <70   . Hypertension   . Inguinal hernia without obstruction or gangrene    BILATERAL  . Primary open angle  glaucoma (POAG) of both eyes, severe stage 11/27/2018  . Primary open angle glaucoma (POAG) of left eye, severe stage 11/27/2018  . Tobacco abuse 11/05/2019  . Tobacco use 11/06/2019    Family History:  Family History  Problem Relation Age of Onset  . Heart failure Mother   . Hypertension Mother   . CAD Father   . Diabetes Father   . Prostate cancer Brother     Social History:  Social History   Tobacco Use  . Smoking status: Current Every Day Smoker    Packs/day: 1.00    Years: 40.00    Pack years: 40.00    Types: Cigarettes  . Smokeless tobacco: Current User    Types: Chew  Vaping Use  . Vaping Use: Never used  Substance Use Topics  . Alcohol use: No  . Drug use: Yes    Types: Marijuana    Review of Systems: Review of Systems  Constitutional: Positive for diaphoresis, malaise/fatigue and weight loss. Negative for chills and fever.  HENT: Negative for congestion and sore throat.   Eyes: Negative for blurred vision and double vision.  Respiratory: Positive for cough (chronic), sputum production (clear) and shortness of breath.   Cardiovascular: Positive for chest pain and palpitations.  Gastrointestinal: Negative for abdominal pain, blood in stool, constipation, diarrhea, nausea and vomiting.  Genitourinary: Negative for dysuria, frequency and hematuria.  Musculoskeletal: Positive for back pain. Negative for myalgias.  Skin: Negative for itching and rash.  Neurological: Positive for dizziness and weakness. Negative for loss of consciousness.     Physical Exam: Physical Exam Vitals and nursing note reviewed.  Constitutional:      General: He is not in acute distress.    Appearance: Normal appearance. He is not ill-appearing.  HENT:     Head: Normocephalic and atraumatic.     Right Ear: External ear normal.     Left Ear: External ear normal.     Nose: Nose normal.     Mouth/Throat:     Mouth: Mucous membranes are moist.  Eyes:     Extraocular Movements:  Extraocular movements intact.  Cardiovascular:     Rate and Rhythm: Normal rate and regular rhythm.     Pulses: Normal pulses.     Heart sounds: Murmur (3/6 systolic murmur) heard.   Pulmonary:     Effort: Pulmonary effort is normal. No respiratory distress.     Breath sounds: Normal breath sounds. No stridor. No wheezing, rhonchi or rales.  Abdominal:     General: Abdomen is flat. There is no distension.     Palpations: Abdomen is soft. There is no mass.     Tenderness: There is no abdominal tenderness. There is no guarding or rebound.  Musculoskeletal:        General: Normal range of motion.  Cervical back: Normal range of motion. No rigidity.  Skin:    General: Skin is warm and dry.  Neurological:     General: No focal deficit present.     Mental Status: He is alert and oriented to person, place, and time.  Psychiatric:        Mood and Affect: Mood normal.        Behavior: Behavior normal.     EKG: personally reviewed my interpretation is normal sinus rhythm, T wave inversions in II, III, AVR, AVF, V1. Of these, the inversion in T is a new change.  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 year old male with hx of  CAD s/p RCA stent, MI in 2010, moderate aortic stenosis, CVA, HTN, DM, HLD presenting for DOE, anemia, weight loss, and fatigue for the past year, worsening recently. Was sent here from his cardiologist for worsening anemia. Receiving 2 units blood in ED.  Symptomatic microcytic anemia  Hgb 7.8, MCV 63 on arrival. In the setting of weight loss, night sweats, no prior colonoscopy. Hx of heavy BC Powder use. DDx includes colon cancer vs gastric ulcer. Has been referred to [facility] previously but  never went to appointment. Was using 8 BC powders a day for back pain but decreased 1 month ago due to abdominal pain. Now using 2-4 per day, and abd pain has resolved. -transfusing 2 units pRBCs -f/u post transfusion H/H -consult GI for endoscopy/colonoscopy vs outpatient GI referral -iron studies -protonix 40mg  BID -daily CBC -f/u with PCP for alternative back pain regimen  Chest pain with EKG changes CAD s/p MI and RCA stent in 2010 Moderate aortic stenosis Has good cardiology follow up. L heart cath in 2018 with total occlusion of RCA which was previously stented, 30% stenosis of L main, 40% stenosis of proximal LAD. Cardiology planning for repeat cath due to increasing SOB and CP, but postponed due to anemia. Echo on 10/19/19 with LVEF 60-65%, grade 1 diastolic dysfunction, moderate aortic stenosis.  No active chest pain today, but has new T wave inversion on lead II on EKG this admission. Seems he is not on baby ASA due to frequent BC powder use per cardiology note. -f/u serial troponins -BP, HLD, and DM treatment as below -f/u with cardiology outpatient -consider starting ASA 81mg  if can reliably stop BC powder  Paroxysmal A fib Patient reports history of paroxysmal A fib going back 20 years. Unable to find mention of this in his cardiology notes, seems they are unaware. No documented EKGs with A fib either. Is on lopressor 50mg  BID at home. No anticoagulant. CHADSVASC score of 5 (7.2% annual stroke risk). -continue Lopressor 50mg  BID -consider starting anticoagulation -f/u with cardiology outpatient -telemetry  HTN BP 126/66 on arrival. At home is on Imdur 60mg  daily, lisinopril 20mg  daily, Lopressor 50mg  BID. -continue home meds   Type II DM No A1c available in EMR. Blood sugar of 200 on presentation. States he has not seen his PCP in over 1 year. Live in [place]. At home is on metformin 1000mg  BID and glipizide 10mg  BID. -f/u A1c -start sliding scale insulin  Tobacco use  disorder Smoked 1 ppd for 40 years, no hx of COPD but denies PFTs. Has  not seen his PCP for this. No wheezing on exam. -f/u for outpatient PFTs -counseled on smoking cessation  Hx CVA HLD CVA in 2011 without residual deficits. Is on Repatha at home, did not tolerate statins. Per cardiology note, is not on ASA due to frequent BC powder use. -restart Repatha on discharge -consider starting ASA 81mg  if can reliably stop BC powder  Dispo: Admit patient to Inpatient with expected length of stay greater than 2 midnights.  Signed: , MD 11/25/2019, 9:31 PM  Pager: [phone]  After 5pm on weekdays and 1pm on weekends: On Call pager: [phone]

## 2019-11-25 NOTE — ED Triage Notes (Signed)
Pt states cardiologist sent him to ED for Hgb 7.9.  Reports generalized weakness for several months.  Denies rectal bleeding.  Reports chronic lower back pain.

## 2019-11-25 NOTE — ED Provider Notes (Signed)
Patient received in sign out from B. Olevia Bowens, pending call from admitting team. Patient was at his cardiologist today, and sent in for evaluation and treatment of symptomatic anemia. Infusion begun for two units of PRBC in ED. Patient accepted for admission by admitting team.   Felicie Morn, NP 11/25/19 1940    Mancel Bale, MD 11/25/19 2326

## 2019-11-25 NOTE — ED Notes (Addendum)
Pt glucose was 97

## 2019-11-25 NOTE — ED Provider Notes (Signed)
[facility] EMERGENCY DEPARTMENT Provider Note   CSN: [id number] Arrival date & time: 11/25/19  1541     History Chief Complaint  Patient presents with  . Low Hgb    Vincent Guerrero is a 59 y.o. male history of CAD, diabetes, hypertension, hyperlipidemia, glaucoma.  Patient sent in today by his cardiologist office for concern of symptomatic anemia.  Patient reports he has had increasing fatigue particularly with exertion over the past year, this has been gradually worsening since that time.  He reports he has had blood work done by his primary provider which showed worsening anemia, it was 7.8 yesterday so they sent him in for admission and blood transfusion.  Patient does report some shortness of breath with exertion and an occasional intermittent chest pain denies any exertional chest pain.  Denies fever/chills, fall/injury, chest pain, cough/hemoptysis, abdominal pain, nausea/vomiting, diarrhea, melena, hematochezia or any additional concerns.  HPI     Past Medical History:  Diagnosis Date  . Age-related nuclear cataract of both eyes 11/27/2018  . Anemia   . Bilateral recurrent inguinal hernia without obstruction or gangrene 05/09/2016   Added automatically from request for surgery [id number]  . Chest pressure 11/06/2019  . Coronary artery disease involving native artery of transplanted heart without angina pectoris   . Cortical age-related cataract of both eyes 11/27/2018  . Depression   . Diabetes mellitus (HCC)   . Hyperlipidemia LDL goal <70   . Hypertension   . Inguinal hernia without obstruction or gangrene    BILATERAL  . Primary open angle glaucoma (POAG) of both eyes, severe stage 11/27/2018  . Primary open angle glaucoma (POAG) of left eye, severe stage 11/27/2018  . Tobacco abuse 11/05/2019  . Tobacco use 11/06/2019    Patient Active Problem List   Diagnosis Date Noted  . Chest pressure 11/06/2019  . Tobacco use 11/06/2019  . Tobacco abuse  11/05/2019  . Anemia   . Inguinal hernia without obstruction or gangrene   . Hypertension   . Diabetes mellitus (HCC)   . Depression   . Age-related nuclear cataract of both eyes 11/27/2018  . Cortical age-related cataract of both eyes 11/27/2018  . Primary open angle glaucoma (POAG) of left eye, severe stage 11/27/2018  . Primary open angle glaucoma (POAG) of both eyes, severe stage 11/27/2018  . Coronary artery disease involving native artery of transplanted heart without angina pectoris   . Hyperlipidemia LDL goal <70   . Bilateral recurrent inguinal hernia without obstruction or gangrene 05/09/2016    Past Surgical History:  Procedure Laterality Date  . HERNIA REPAIR  1998  . LEFT HEART CATH AND CORONARY ANGIOGRAPHY N/A 06/04/2016   Procedure: Left Heart Cath and Coronary Angiography;  Surgeon: Lyn Records, MD;  Location: [facility] INVASIVE CV LAB;  Service: Cardiovascular;  Laterality: N/A;       Family History  Problem Relation Age of Onset  . Heart failure Mother   . Hypertension Mother   . CAD Father   . Diabetes Father   . Prostate cancer Brother     Social History   Tobacco Use  . Smoking status: Current Every Day Smoker    Packs/day: 1.00    Types: Cigarettes  . Smokeless tobacco: Current User    Types: Chew  Vaping Use  . Vaping Use: Never used  Substance Use Topics  . Alcohol use: No  . Drug use: Yes    Types: Marijuana    Home Medications Prior to  Admission medications   Medication Sig Start Date End Date Taking? Authorizing Provider  Aspirin-Salicylamide-Caffeine (BC HEADACHE POWDER PO) Take by mouth 3 (three) times daily.    [provider]  brimonidine (ALPHAGAN) 0.2 % ophthalmic solution Place 1 drop into the right eye 2 (two) times daily.  12/16/18   [provider]  buPROPion (WELLBUTRIN SR) 150 MG 12 hr tablet Take 150 mg by mouth 2 (two) times daily. 11/15/19   [provider]  buPROPion (ZYBAN) 150 MG 12 hr tablet  Start with 1 tablet daily for 3 days, then increase to 1 tablet twice daily.  Start 1 week before your quit date. 11/05/19   Tobb, Kardie, DO  Difluprednate 0.05 % EMUL Apply 1 drop to eye in the morning, at noon, in the evening, and at bedtime.  12/24/18   [provider]  Evolocumab with Infusor (REPATHA PUSHTRONEX SYSTEM) 420 MG/3.5ML SOCT Inject 420 mg into the skin every 30 (thirty) days.    [provider]  glipiZIDE (GLUCOTROL) 10 MG tablet Take 10 mg by mouth in the morning. 11/19/18   [provider]  isosorbide mononitrate (IMDUR) 60 MG 24 hr tablet Take 1 tablet (60 mg total) by mouth daily. 11/06/19 02/04/20  Tobb, Kardie, DO  lisinopril (PRINIVIL,ZESTRIL) 20 MG tablet Take 1 tablet (20 mg total) by mouth 2 (two) times daily. 05/11/16   Janetta Hora, PA-C  metFORMIN (GLUCOPHAGE) 1000 MG tablet Take 1,000 mg by mouth 2 (two) times daily with a meal.    [provider]  metoprolol (LOPRESSOR) 50 MG tablet Take 1 tablet (50 mg total) by mouth 2 (two) times daily. 05/11/16   Janetta Hora, PA-C  Netarsudil-Latanoprost (ROCKLATAN) 0.02-0.005 % SOLN Place 1 drop into the right eye at bedtime.     [provider]  potassium chloride SA (KLOR-CON M20) 20 MEQ tablet Take 1 tab (20 meq) on Tues,Thurs,and Saturdays 12/10/18   Tobb, Kardie, DO    Allergies    Patient has no known allergies.  Review of Systems   Review of Systems Ten systems are reviewed and are negative for acute change except as noted in the HPI  Physical Exam Updated Vital Signs BP 127/62   Pulse 86   Temp 99 F (37.2 C) (Oral)   Resp 18   Ht 5\' 10"  (1.778 m)   Wt 93 kg   SpO2 99%   BMI 29.41 kg/m   Physical Exam Constitutional:      General: He is not in acute distress.    Appearance: Normal appearance. He is well-developed. He is not ill-appearing or diaphoretic.  HENT:     Head: Normocephalic and atraumatic.  Eyes:     General: Vision grossly intact. Gaze  aligned appropriately.     Pupils: Pupils are equal, round, and reactive to light.  Neck:     Trachea: Trachea and phonation normal.  Cardiovascular:     Rate and Rhythm: Normal rate and regular rhythm.     Pulses: Normal pulses.  Pulmonary:     Effort: Pulmonary effort is normal. No respiratory distress.     Breath sounds: Normal breath sounds.  Abdominal:     General: There is no distension.     Palpations: Abdomen is soft.     Tenderness: There is no abdominal tenderness. There is no guarding or rebound.  Genitourinary:    Comments: Rectal examination chaperoned by Daija RN.  No external hemorrhoids or abnormalities.  Normal rectal tone.  No palpable internal hemorrhoids.  Light brown stool without frank blood or melena. Musculoskeletal:        General: Normal range of motion.     Cervical back: Normal range of motion.  Skin:    General: Skin is warm and dry.  Neurological:     Mental Status: He is alert.     GCS: GCS eye subscore is 4. GCS verbal subscore is 5. GCS motor subscore is 6.     Comments: Speech is clear and goal oriented, follows commands Major Cranial nerves without deficit, no facial droop Moves extremities without ataxia, coordination intact  Psychiatric:        Behavior: Behavior normal.     ED Results / Procedures / Treatments   Labs (all labs ordered are listed, but only abnormal results are displayed) Labs Reviewed  BASIC METABOLIC PANEL - Abnormal; Notable for the following components:      Result Value   Glucose, Bld 200 (*)    All other components within normal limits  CBC - Abnormal; Notable for the following components:   Hemoglobin 7.8 (*)    HCT 29.4 (*)    MCV 63.9 (*)    MCH 17.0 (*)    MCHC 26.5 (*)    RDW 21.6 (*)    All other components within normal limits  RESPIRATORY PANEL BY RT PCR (FLU A&B, COVID)  URINALYSIS, ROUTINE W REFLEX MICROSCOPIC  POC OCCULT BLOOD, ED  TYPE AND SCREEN  ABO/RH  PREPARE RBC (CROSSMATCH)     EKG None  Radiology No results found.  Procedures .Critical Care Performed by: Bill SalinasMorelli, Artavia Jeanlouis A, PA-C Authorized by: Bill SalinasMorelli, Shamaria Kavan A, PA-C   Critical care provider statement:    Critical care time (minutes):  31   Critical care was necessary to treat or prevent imminent or life-threatening deterioration of the following conditions: Symptomatic anemia.   Critical care was time spent personally by me on the following activities:  Discussions with consultants, evaluation of patient's response to treatment, examination of patient, ordering and performing treatments and interventions, ordering and review of laboratory studies, ordering and review of radiographic studies, pulse oximetry, re-evaluation of patient's condition, obtaining history from patient or surrogate, review of old charts and development of treatment plan with patient or surrogate   (including critical care time)  Medications Ordered in ED Medications  0.9 %  sodium chloride infusion (Manually program via Guardrails IV Fluids) (has no administration in time range)    ED Course  I have reviewed the triage vital signs and the nursing notes.  Pertinent labs & imaging results that were available during my care of the patient were reviewed by me and considered in my medical decision making (see chart for details).  Clinical Course as of Nov 25 1819  Wed Nov 25, 2019  1744 Daija RN   [BM]    Clinical Course User Index [BM] Elizabeth PalauMorelli, Adryan Druckenmiller A, PA-C   MDM Rules/Calculators/A&P                         Additional history obtained from: 1. Nursing notes from this visit. 2. Review of electronic medical record.  Reviewed patient's cardiology visit from yesterday.  Per their note they were concerned for symptomatic anemia, they repeated blood work and it appears if patient had further decrease in hemoglobin they would refer him to the emergency department.  They were also considering GI referral.  They started patient  on Protonix.  It  appears patient had echocardiogram on 10/14/2019, this showed moderate aortic stenosis and moderate thickened heart wall.  Normal ejection fraction. ----------------------- CBC shows decreased hemoglobin of 7.8, no leukocytosis to suggest infection. Type and screen B+, antibody negative. BMP without emergent electrolyte derangement, AKI or gap normal BUN. Hemoccult negative. - Patient's history and presentation is consistent with symptomatic anemia.  Patient reassessed he is resting comfortably in bed no acute distress.  Risk versus benefits of blood transfusion were discussed and patient wishes to proceed with blood transfusion at this time.  Will consult hospitalist service for admission.  Case discussed with Dr. Effie Shy during this visit.  Chest x-ray and Covid test have been added. - Covid/influenza panel negative. CXR:  IMPRESSION:  Portions of the lateral costophrenic angles are excluded from the  field of view bilaterally.    No evidence of active cardiopulmonary disease.  -  Care handoff given to Felicie Morn, NP, at shift change, plan of care is to await for admissions team to call and admit the patient.  Note: Portions of this report may have been transcribed using voice recognition software. Every effort was made to ensure accuracy; however, inadvertent computerized transcription errors may still be present. Final Clinical Impression(s) / ED Diagnoses Final diagnoses:  Symptomatic anemia    Rx / DC Orders ED Discharge Orders    None       Elizabeth Palau 11/25/19 1920    Mancel Bale, MD 11/25/19 2326

## 2019-11-26 ENCOUNTER — Ambulatory Visit: Payer: BLUE CROSS/BLUE SHIELD | Admitting: Cardiology

## 2019-11-26 DIAGNOSIS — I2510 Atherosclerotic heart disease of native coronary artery without angina pectoris: Secondary | ICD-10-CM | POA: Diagnosis present

## 2019-11-26 DIAGNOSIS — Z7984 Long term (current) use of oral hypoglycemic drugs: Secondary | ICD-10-CM | POA: Diagnosis not present

## 2019-11-26 DIAGNOSIS — I25811 Atherosclerosis of native coronary artery of transplanted heart without angina pectoris: Secondary | ICD-10-CM | POA: Diagnosis not present

## 2019-11-26 DIAGNOSIS — K254 Chronic or unspecified gastric ulcer with hemorrhage: Secondary | ICD-10-CM | POA: Diagnosis present

## 2019-11-26 DIAGNOSIS — Z8673 Personal history of transient ischemic attack (TIA), and cerebral infarction without residual deficits: Secondary | ICD-10-CM | POA: Diagnosis not present

## 2019-11-26 DIAGNOSIS — E7800 Pure hypercholesterolemia, unspecified: Secondary | ICD-10-CM | POA: Diagnosis not present

## 2019-11-26 DIAGNOSIS — Z20822 Contact with and (suspected) exposure to covid-19: Secondary | ICD-10-CM | POA: Diagnosis present

## 2019-11-26 DIAGNOSIS — H2513 Age-related nuclear cataract, bilateral: Secondary | ICD-10-CM | POA: Diagnosis present

## 2019-11-26 DIAGNOSIS — H401133 Primary open-angle glaucoma, bilateral, severe stage: Secondary | ICD-10-CM | POA: Diagnosis present

## 2019-11-26 DIAGNOSIS — I252 Old myocardial infarction: Secondary | ICD-10-CM | POA: Diagnosis not present

## 2019-11-26 DIAGNOSIS — R252 Cramp and spasm: Secondary | ICD-10-CM | POA: Diagnosis present

## 2019-11-26 DIAGNOSIS — Z23 Encounter for immunization: Secondary | ICD-10-CM | POA: Diagnosis not present

## 2019-11-26 DIAGNOSIS — R079 Chest pain, unspecified: Secondary | ICD-10-CM | POA: Diagnosis not present

## 2019-11-26 DIAGNOSIS — E1136 Type 2 diabetes mellitus with diabetic cataract: Secondary | ICD-10-CM | POA: Diagnosis present

## 2019-11-26 DIAGNOSIS — I352 Nonrheumatic aortic (valve) stenosis with insufficiency: Secondary | ICD-10-CM

## 2019-11-26 DIAGNOSIS — K259 Gastric ulcer, unspecified as acute or chronic, without hemorrhage or perforation: Secondary | ICD-10-CM | POA: Diagnosis not present

## 2019-11-26 DIAGNOSIS — D649 Anemia, unspecified: Secondary | ICD-10-CM | POA: Diagnosis present

## 2019-11-26 DIAGNOSIS — R195 Other fecal abnormalities: Secondary | ICD-10-CM | POA: Diagnosis present

## 2019-11-26 DIAGNOSIS — I10 Essential (primary) hypertension: Secondary | ICD-10-CM | POA: Diagnosis present

## 2019-11-26 DIAGNOSIS — M549 Dorsalgia, unspecified: Secondary | ICD-10-CM | POA: Diagnosis present

## 2019-11-26 DIAGNOSIS — F32A Depression, unspecified: Secondary | ICD-10-CM | POA: Diagnosis present

## 2019-11-26 DIAGNOSIS — D500 Iron deficiency anemia secondary to blood loss (chronic): Secondary | ICD-10-CM | POA: Diagnosis present

## 2019-11-26 DIAGNOSIS — G8929 Other chronic pain: Secondary | ICD-10-CM | POA: Diagnosis present

## 2019-11-26 DIAGNOSIS — Z79899 Other long term (current) drug therapy: Secondary | ICD-10-CM | POA: Diagnosis not present

## 2019-11-26 DIAGNOSIS — H25813 Combined forms of age-related cataract, bilateral: Secondary | ICD-10-CM | POA: Diagnosis present

## 2019-11-26 DIAGNOSIS — I350 Nonrheumatic aortic (valve) stenosis: Secondary | ICD-10-CM | POA: Diagnosis present

## 2019-11-26 DIAGNOSIS — K4020 Bilateral inguinal hernia, without obstruction or gangrene, not specified as recurrent: Secondary | ICD-10-CM | POA: Diagnosis present

## 2019-11-26 DIAGNOSIS — Z7982 Long term (current) use of aspirin: Secondary | ICD-10-CM | POA: Diagnosis not present

## 2019-11-26 DIAGNOSIS — Z791 Long term (current) use of non-steroidal anti-inflammatories (NSAID): Secondary | ICD-10-CM | POA: Diagnosis not present

## 2019-11-26 DIAGNOSIS — E785 Hyperlipidemia, unspecified: Secondary | ICD-10-CM | POA: Diagnosis present

## 2019-11-26 LAB — TYPE AND SCREEN
ABO/RH(D): B POS
Antibody Screen: NEGATIVE
Unit division: 0
Unit division: 0

## 2019-11-26 LAB — CREATININE, SERUM
Creatinine, Ser: 0.97 mg/dL (ref 0.61–1.24)
GFR, Estimated: >60 mL/min (ref >60)

## 2019-11-26 LAB — BPAM RBC
Blood Product Expiration Date: 202111112359
Blood Product Expiration Date: 202111112359
ISSUE DATE / TIME: 202110201853
ISSUE DATE / TIME: 202110202231
Unit Type and Rh: 7300
Unit Type and Rh: 7300

## 2019-11-26 LAB — CBC
HCT: 32.4 % — ABNORMAL LOW (ref 39.0–52.0)
Hemoglobin: 9.2 g/dL — ABNORMAL LOW (ref 13.0–17.0)
MCH: 19 pg — ABNORMAL LOW (ref 26.0–34.0)
MCHC: 28.4 g/dL — ABNORMAL LOW (ref 30.0–36.0)
MCV: 66.8 fL — ABNORMAL LOW (ref 80.0–100.0)
Platelets: 243 10*3/uL (ref 150–400)
RBC: 4.85 MIL/uL (ref 4.22–5.81)
RDW: 24 % — ABNORMAL HIGH (ref 11.5–15.5)
WBC: 9.6 10*3/uL (ref 4.0–10.5)
nRBC: 0 % (ref 0.0–0.2)

## 2019-11-26 LAB — CBG MONITORING, ED
Glucose-Capillary: 185 mg/dL — ABNORMAL HIGH (ref 70–99)
Glucose-Capillary: 182 mg/dL — ABNORMAL HIGH (ref 70–99)
Glucose-Capillary: 201 mg/dL — ABNORMAL HIGH (ref 70–99)

## 2019-11-26 LAB — HIV ANTIBODY (ROUTINE TESTING W REFLEX): HIV Screen 4th Generation wRfx: NONREACTIVE

## 2019-11-26 LAB — URINALYSIS, ROUTINE W REFLEX MICROSCOPIC
Bilirubin Urine: NEGATIVE
Glucose, UA: 50 mg/dL — AB
Hgb urine dipstick: NEGATIVE
Ketones, ur: NEGATIVE mg/dL
Leukocytes,Ua: NEGATIVE
Nitrite: NEGATIVE
Protein, ur: NEGATIVE mg/dL
Specific Gravity, Urine: 1.006 (ref 1.005–1.030)
pH: 7 (ref 5.0–8.0)

## 2019-11-26 LAB — IRON AND TIBC
Iron: 18 ug/dL — ABNORMAL LOW (ref 45–182)
Saturation Ratios: 4 % — ABNORMAL LOW (ref 17.9–39.5)
TIBC: 433 ug/dL (ref 250–450)
UIBC: 415 ug/dL

## 2019-11-26 LAB — FERRITIN: Ferritin: 4 ng/mL — ABNORMAL LOW (ref 24–336)

## 2019-11-26 MED ORDER — NICOTINE 21 MG/24HR TD PT24
21.0000 mg | MEDICATED_PATCH | Freq: Every day | TRANSDERMAL | Status: DC
  Administered 2019-11-26 – 2019-11-28 (×3): 21 mg via TRANSDERMAL
  Filled 2019-11-26 (×3): qty 1

## 2019-11-26 MED ORDER — PEG-KCL-NACL-NASULF-NA ASC-C 100 G PO SOLR
0.5000 | Freq: Once | ORAL | Status: AC
  Administered 2019-11-26: 100 g via ORAL
  Filled 2019-11-26: qty 1

## 2019-11-26 MED ORDER — PEG-KCL-NACL-NASULF-NA ASC-C 100 G PO SOLR
0.5000 | Freq: Once | ORAL | Status: AC
  Administered 2019-11-27: 100 g via ORAL
  Filled 2019-11-26: qty 1

## 2019-11-26 MED ORDER — PEG-KCL-NACL-NASULF-NA ASC-C 100 G PO SOLR: 1.0000 | Freq: Once | ORAL | Status: DC

## 2019-11-26 NOTE — Consult Note (Addendum)
Cardiology Consultation:   Patient ID: Vincent Guerrero; [id number]; 1960/10/07   Admit date: 11/25/2019 Date of Consult: 11/26/2019  Primary Care Provider: Simone Curia, MD Primary Cardiologist: Thomasene Ripple, DO 11/24/2019 Primary Electrophysiologist:  None   Patient Profile:   Vincent Guerrero is a 59 y.o. 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  History of Present Illness:   Vincent Guerrero was seen by Dr Servando Salina on 10/01. He was complaining of CP/SOB with mild exertion. Cardiac cath was planned.   Precath labs showed Hgb 8.4 >> GI eval recommended. He was seen in the office on 10/19, GI eval had not happened and SOB was getting worse. Labs repeated, pt started on Protonix. H&H now 7.9/28.4.   Pt asked to come to the ER for eval. In the ER, Hgb now 7.8. He has been transfused and Hgb has improved. GI seeing, needs EGD/Colon for symptomatic anemia in the setting of chronic NSAID use. Cards asked to see for pre-procedure clearance.   Vincent Guerrero has chronic back issues from an injury that goes back to when he was a teenager.  The pain has gotten worse as he has aged.  On a bad day, he was using 8-10 BC powders.  On a good day, 4-6.  He has not noticed any dark or tarry stools.  He has not noticed any significant abdominal or epigastric pain.  His anginal symptoms have been getting worse since the spring.  Prior to the acceleration in his symptoms, he would get chest pain about once a week, relieved by rest.    He feels that his ability to ambulate has decreased gradually to the point that he cannot walk 50 or 60 feet without getting shortness of breath and then chest pain.  The chest pain is routinely a 5 or 6/10.    There was one episode, he was awakened in the night by severe chest pain, 10/10, that was brief.  He did not take anything for it.  He  realized that he was still having symptoms a little later, and went to the [facility] emergency room.  In the ER, labs were drawn but he was never seen by provider.  He left AMA.  This was September 24.  He thinks that his hemoglobin was 8.7 at that time.  Dr. Servando Salina was able to review the ECG and stated that it did not have any ischemic changes.  At his office visit on 11/06/2019, his Imdur was increased and cardiac catheterization was planned.  Smoking cessation was encouraged.  In September, he started cutting back on NSAID use.  That may have helped slow the decline in his hemoglobin.  He has not had any resting chest pain.  He has not had any resting shortness of breath, no lower extremity edema, no orthopnea or PND.  No palpitations, no presyncope or syncope.  He has been a little lightheaded at times, orthostatic by description.  He has had 2 units of blood in the ER today and feels better generally.   Past Medical History:  Diagnosis Date  . Age-related nuclear cataract of both eyes 11/27/2018  . Anemia   . Bilateral recurrent inguinal hernia without obstruction or gangrene 05/09/2016   Added automatically from request for surgery [id number]  . Chest pressure 11/06/2019  . Coronary artery disease  involving native artery of transplanted heart without angina pectoris   . Cortical age-related cataract of both eyes 11/27/2018  . Depression   . Diabetes mellitus (HCC)   . Hyperlipidemia LDL goal <70   . Hypertension   . Inguinal hernia without obstruction or gangrene    BILATERAL  . Primary open angle glaucoma (POAG) of both eyes, severe stage 11/27/2018  . Primary open angle glaucoma (POAG) of left eye, severe stage 11/27/2018  . Tobacco abuse 11/05/2019  . Tobacco use 11/06/2019    Past Surgical History:  Procedure Laterality Date  . HERNIA REPAIR  1998  . LEFT HEART CATH AND CORONARY ANGIOGRAPHY N/A 06/04/2016   Procedure: Left Heart Cath and Coronary Angiography;  Surgeon:  Lyn Records, MD;  Location: [facility] INVASIVE CV LAB;  Service: Cardiovascular;  Laterality: N/A;     Prior to Admission medications   Medication Sig Start Date End Date Taking? Authorizing Provider  Aspirin-Salicylamide-Caffeine (BC HEADACHE POWDER PO) Take 3-6 packets by mouth See admin instructions. Dissolve 3-6 packets orally daily   Yes [provider]  Evolocumab with Infusor (REPATHA PUSHTRONEX SYSTEM) 420 MG/3.5ML SOCT Inject 420 mg into the skin every 30 (thirty) days.   Yes [provider]  glipiZIDE (GLUCOTROL) 10 MG tablet Take 10 mg by mouth 2 (two) times daily before a meal.  11/19/18  Yes [provider]  isosorbide mononitrate (IMDUR) 30 MG 24 hr tablet Take 60 mg by mouth daily.   Yes [provider]  lisinopril (PRINIVIL,ZESTRIL) 20 MG tablet Take 1 tablet (20 mg total) by mouth 2 (two) times daily. 05/11/16  Yes Janetta Hora, PA-C  metFORMIN (GLUCOPHAGE) 1000 MG tablet Take 1,000 mg by mouth 2 (two) times daily with a meal.   Yes [provider]  metoprolol (LOPRESSOR) 50 MG tablet Take 1 tablet (50 mg total) by mouth 2 (two) times daily. 05/11/16  Yes Janetta Hora, PA-C  Multiple Vitamins-Minerals (CENTRUM SILVER 50+MEN) TABS Take 1 tablet by mouth daily with breakfast.   Yes [provider]  Netarsudil-Latanoprost (ROCKLATAN) 0.02-0.005 % SOLN Place 1 drop into the right eye at bedtime.    Yes [provider]  POTASSIUM PO Take 1 tablet by mouth 3 (three) times a week.   Yes [provider]  brimonidine (ALPHAGAN) 0.2 % ophthalmic solution Place 1 drop into the right eye 2 (two) times daily.  Patient not taking: Reported on 11/25/2019 12/16/18   [provider]  buPROPion (ZYBAN) 150 MG 12 hr tablet Start with 1 tablet daily for 3 days, then increase to 1 tablet twice daily.  Start 1 week before your quit date. 11/05/19   Tobb, Kardie, DO  Difluprednate 0.05 % EMUL Apply 1 drop to eye in the  morning, at noon, in the evening, and at bedtime.  Patient not taking: Reported on 11/25/2019 12/24/18   [provider]  isosorbide mononitrate (IMDUR) 60 MG 24 hr tablet Take 1 tablet (60 mg total) by mouth daily. 11/06/19 02/04/20  Tobb, Kardie, DO  potassium chloride SA (KLOR-CON M20) 20 MEQ tablet Take 1 tab (20 meq) on Tues,Thurs,and Saturdays Patient not taking: Reported on 11/25/2019 12/10/18   Thomasene Ripple, DO    Inpatient Medications: Scheduled Meds: . sodium chloride   Intravenous Once  . insulin aspart  0-9 Units Subcutaneous TID WC  . isosorbide mononitrate  60 mg Oral Daily  . lidocaine  2 patch Transdermal Q24H  . lisinopril  20 mg Oral BID  .  metoprolol tartrate  50 mg Oral BID  . Netarsudil-Latanoprost  1 drop Right Eye QHS  . pantoprazole  40 mg Oral BID   Continuous Infusions:  PRN Meds: acetaminophen **OR** acetaminophen  Allergies:   No Known Allergies  Social History:   Social History   Socioeconomic History  . Marital status: Legally Separated    Spouse name: Not on file  . Number of children: Not on file  . Years of education: Not on file  . Highest education level: Not on file  Occupational History  . Not on file  Tobacco Use  . Smoking status: Current Every Day Smoker    Packs/day: 1.00    Years: 40.00    Pack years: 40.00    Types: Cigarettes  . Smokeless tobacco: Current User    Types: Chew  Vaping Use  . Vaping Use: Never used  Substance and Sexual Activity  . Alcohol use: No  . Drug use: Yes    Types: Marijuana  . Sexual activity: Yes  Other Topics Concern  . Not on file  Social History Narrative  . Not on file   Social Determinants of Health   Financial Resource Strain:   . Difficulty of Paying Living Expenses: Not on file  Food Insecurity:   . Worried About Programme researcher, broadcasting/film/videounning Out of Food in the Last Year: Not on file  . Ran Out of Food in the Last Year: Not on file  Transportation Needs:   . Lack of Transportation (Medical):  Not on file  . Lack of Transportation (Non-Medical): Not on file  Physical Activity:   . Days of Exercise per Week: Not on file  . Minutes of Exercise per Session: Not on file  Stress:   . Feeling of Stress : Not on file  Social Connections:   . Frequency of Communication with Friends and Family: Not on file  . Frequency of Social Gatherings with Friends and Family: Not on file  . Attends Religious Services: Not on file  . Active Member of Clubs or Organizations: Not on file  . Attends BankerClub or Organization Meetings: Not on file  . Marital Status: Not on file  Intimate Partner Violence:   . Fear of Current or Ex-Partner: Not on file  . Emotionally Abused: Not on file  . Physically Abused: Not on file  . Sexually Abused: Not on file    Family History:   Family History  Problem Relation Age of Onset  . Heart failure Mother   . Hypertension Mother   . CAD Father   . Diabetes Father   . Prostate cancer Brother    Family Status:  Family Status  Relation Name Status  . Mother  Deceased  . Father  Deceased  . MGM  Deceased  . MGF  Deceased  . PGM  Deceased  . PGF  Deceased  . Brother  (Not Specified)    ROS:  Please see the history of present illness.  All other ROS reviewed and negative.     Physical Exam/Data:   Vitals:   11/26/19 0930 11/26/19 0941 11/26/19 1000 11/26/19 1030  BP: (!) 151/62 (!) 151/62 (!) 154/76 (!) 157/81  Pulse: (!) 59 (!) 58 (!) 57 61  Resp: 17  15 17   Temp:      TempSrc:      SpO2: 99%  98% 98%  Weight:      Height:       No intake or output data in the 24 hours  ending 11/26/19 1347  Last 3 Weights 11/25/2019 11/24/2019 11/06/2019  Weight (lbs) 205 lb 210 lb 9.6 oz 205 lb  Weight (kg) 92.987 kg 95.528 kg 92.987 kg     Body mass index is 29.41 kg/m.   General:  Well nourished, well developed, male in no acute distress HEENT: normal Lymph: no adenopathy Neck: JVD -not elevated Endocrine:  No thryomegaly Vascular: No carotid bruits;  4/4 extremity pulses 2+  Cardiac:  normal S1, S2; RRR; 3/6 murmur Lungs:  clear bilaterally, no wheezing, rhonchi or rales  Abd: soft, nontender, no hepatomegaly  Ext: no edema Musculoskeletal:  No deformities, BUE and BLE strength normal and equal Skin: warm and dry  Neuro:  CNs 2-12 intact, no focal abnormalities noted Psych:  Normal affect   EKG:  The EKG was personally reviewed and demonstrates:  10/20 ECG is SR, HR 79, Inf T wave inversions are unchanged Telemetry:  Telemetry was personally reviewed and demonstrates:  SR   CV studies:   ECHO: 10/14/2019 1. Left ventricular ejection fraction, by estimation, is 60 to 65%. The  left ventricle has normal function. The left ventricle has no regional  wall motion abnormalities. There is moderate left ventricular hypertrophy.  Left ventricular diastolic parameters are consistent with Grade I diastolic dysfunction (impaired relaxation).  2. Right ventricular systolic function is normal. The right ventricular  size is normal. There is normal pulmonary artery systolic pressure.  3. The mitral valve is normal in structure. No evidence of mitral valve  regurgitation. No evidence of mitral stenosis.  4. The aortic valve is normal in structure. Aortic valve regurgitation is  moderate. Moderate aortic valve stenosis.  5. The inferior vena cava is normal in size with greater than 50%  respiratory variability, suggesting right atrial pressure of 3 mmHg.   CATH: 06/04/2016  Total occlusion of nondominant right coronary artery which was previously stented. The occlusion is aorta ostial.  30% distal left main.  40% proximal LAD diffuse narrowing.  60% segmental narrowing ostium to proximal ramus intermedius.  Normal left ventricular size and function. LVEDP is mildly elevated at 20 mmHg.  Minimal aortic valve gradient peak to peak, 18 mmHg   RECOMMENDATIONS:   Aggressive risk factor modification including smoking cessation,  lipid lowering, and blood pressure control.  Cleared for upcoming umbilical herniorrhaphy.    Laboratory Data:   Chemistry Recent Labs  Lab 11/24/19 1557 11/25/19 1554 11/26/19 0523  NA 140 138  --   K 4.5 4.2  --   CL 104 105  --   CO2 22 24  --   GLUCOSE 238* 200*  --   BUN 16 16  --   CREATININE 0.99 1.19 0.97  CALCIUM 9.0 8.9  --   GFRNONAA 83 >60 >60  GFRAA 96  --   --   ANIONGAP  --  9  --     No results found for: ALT, AST, GGT, ALKPHOS, BILITOT Hematology Recent Labs  Lab 11/24/19 1557 11/25/19 1554 11/26/19 0430  WBC 10.1 9.7 9.6  RBC 4.54 4.60 4.85  HGB 7.9* 7.8* 9.2*  HCT 28.4* 29.4* 32.4*  MCV 63* 63.9* 66.8*  MCH 17.4* 17.0* 19.0*  MCHC 27.8* 26.5* 28.4*  RDW 20.1* 21.6* 24.0*  PLT 303 279 243   Cardiac Enzymes High Sensitivity Troponin:   Recent Labs  Lab 11/25/19 2044  TROPONINIHS 7      BNPNo results for input(s): BNP, PROBNP in the last 168 hours.  DDimer No results for  input(s): DDIMER in the last 168 hours. TSH:  Lab Results  Component Value Date   TSH 0.585 12/10/2018   Lipids: Lab Results  Component Value Date   CHOL 172 12/10/2018   HDL 43 12/10/2018   LDLCALC 100 (H) 12/10/2018   TRIG 165 (H) 12/10/2018   CHOLHDL 4.0 12/10/2018   HgbA1c:No results found for: HGBA1C Magnesium:  Magnesium  Date Value Ref Range Status  11/24/2019 1.5 (L) 1.6 - 2.3 mg/dL Final   Lab Results  Component Value Date   IRON 18 (L) 11/26/2019   TIBC 433 11/26/2019   FERRITIN 4 (L) 11/26/2019    Radiology/Studies:  DG Chest Portable 1 View  Result Date: 11/25/2019 CLINICAL DATA:  Fatigue. EXAM: PORTABLE CHEST 1 VIEW COMPARISON:  Prior chest radiographs 10/30/2019. FINDINGS: Please note portions of the lateral costophrenic angles are excluded from the field of view bilaterally. Heart size within normal limits. No appreciable airspace consolidation or pulmonary edema. No evidence of pleural effusion or pneumothorax. No acute bony  abnormality identified. IMPRESSION: Portions of the lateral costophrenic angles are excluded from the field of view bilaterally. No evidence of active cardiopulmonary disease. Electronically Signed   By: Jackey Loge DO   On: 11/25/2019 18:23    Assessment and Plan:   1. Dyspnea/chest pain.   Pt with hx of CP / SOB   He is followed by K Tobb, just seen in clinic on 10/19  PRgressive   Being set up for L heart cath when found to be anemic Hgb noted to be 7.8     Symptoms most likely represent symptomatic anemia in a patieht with known CAD (chronic occ RCA/70% ramus) and moderatly severe AS  For now need to evaluate GI source of blood loss   I think he is OK to proceed with EGD/ colonsopy to define.  ANy furhter cardiac work up will come after evaluation for bleeding   Watch BP, transfuse as needed, fluids as needed   2.  CAD   Known CAD   FOllow symptoms after transfusion   Again, further work up will depend on findings / response to normalization of Hgb  3  AS   Moderate to severe with mean gradient of 38 mm Hg.   Anemia is exacerbating with increased flow     Follow for now    4   Anemia  As above  Follow H/H and transfuse as needed   5   Carotid bruits  May reflect radiating murmur from AS   F/U after with possible carotid USN  6  Lipids  Check in AM  Pt is not on statin at home   Principal Problem:   Symptomatic anemia Active Problems:   Coronary artery disease involving native artery of transplanted heart without angina pectoris   Hypertension   Diabetes mellitus (HCC)   For questions or updates, please contact [facility] Please consult [email or web address] for contact info under Cardiology/STEMI.   Signed, Theodore Demark, PA-C  11/26/2019 1:47 PM   Pt seen and examined  I agree with findings as noted by PT is relatively comfortable in bed  Feels some better after transfusion ON exam: Neck:  JVP is normal  Bilateral bruits Lungs are rel clear Cardiac RRR  Gr III/VI  sysotlic murmur LSB to base Abd is supple Ext are with tr edema   My assessment and plan is above   I have modifed note by R Barrett Will continue to follow   Dietrich Pates MD

## 2019-11-26 NOTE — Progress Notes (Signed)
HD#0 Subjective:   Patient admitted overnight.   Patient states he is feeling "much better" after blood transfusions. Denies current chest pain. Notes the reported night sweats have "pretty much stopped." Reports history of acid reflux, occurs twice monthly.  Reports he takes 2-4 Goody powders daily currently but notes that previously when back pain was severe, would take 6-8. Expresses surprise that Goody powders could be underlying cause stating, "but I've been taking those for years."  He has no other complaints or concerns at the time of my examination  Objective:   Vital signs in last 24 hours: Vitals:   11/26/19 0530 11/26/19 0600 11/26/19 0630 11/26/19 0700  BP: (!) 163/85 (!) 147/68 (!) 147/76 (!) 178/74  Pulse: 64 (!) 55 60 67  Resp:      Temp:      TempSrc:      SpO2: 98% 99% 96% 97%  Weight:      Height:       Supplemental O2: Room Air SpO2: 97 %  Physical Exam Physical Exam Vitals and nursing note reviewed.  Constitutional:      General: He is not in acute distress.    Appearance: Normal appearance. He is not ill-appearing, toxic-appearing or diaphoretic.  Cardiovascular:     Rate and Rhythm: Normal rate and regular rhythm.     Heart sounds: Murmur (3/6 systolic crescendo-decrescendo) heard.   Pulmonary:     Effort: Pulmonary effort is normal. No respiratory distress.     Breath sounds: Normal breath sounds. No stridor. No wheezing, rhonchi or rales.  Musculoskeletal:     Right lower leg: No edema.     Left lower leg: No edema.  Skin:    General: Skin is warm and dry.  Neurological:     General: No focal deficit present.     Mental Status: He is alert and oriented to person, place, and time. Mental status is at baseline.  Psychiatric:        Mood and Affect: Mood normal.        Behavior: Behavior normal.     Filed Weights   11/25/19 1546  Weight: 93 kg    No intake or output data in the 24 hours ending 11/26/19 0842 Net IO Since  Admission: No IO data has been entered for this period [11/26/19 0842]  Pertinent Labs: CBC Latest Ref Rng & Units 11/26/2019 11/25/2019 11/24/2019  WBC 4.0 - 10.5 K/uL 9.6 9.7 10.1  Hemoglobin 13.0 - 17.0 g/dL 1.5(V) 7.8(L) 7.9(L)  Hematocrit 39 - 52 % 32.4(L) 29.4(L) 28.4(L)  Platelets 150 - 400 K/uL 243 279 303    CMP Latest Ref Rng & Units 11/26/2019 11/25/2019 11/24/2019  Glucose 70 - 99 mg/dL - 761(Y) 073(X)  BUN 6 - 20 mg/dL - 16 16  Creatinine 1.06 - 1.24 mg/dL 2.69 4.85 4.62  Sodium 135 - 145 mmol/L - 138 140  Potassium 3.5 - 5.1 mmol/L - 4.2 4.5  Chloride 98 - 111 mmol/L - 105 104  CO2 22 - 32 mmol/L - 24 22  Calcium 8.9 - 10.3 mg/dL - 8.9 9.0    Imaging: DG Chest Portable 1 View  Result Date: 11/25/2019 CLINICAL DATA:  Fatigue. EXAM: PORTABLE CHEST 1 VIEW COMPARISON:  Prior chest radiographs 10/30/2019. FINDINGS: Please note portions of the lateral costophrenic angles are excluded from the field of view bilaterally. Heart size within normal limits. No appreciable airspace consolidation or pulmonary edema. No evidence of pleural effusion or pneumothorax. No acute  bony abnormality identified. IMPRESSION: Portions of the lateral costophrenic angles are excluded from the field of view bilaterally. No evidence of active cardiopulmonary disease. Electronically Signed   By: Jackey Loge DO   On: 11/25/2019 18:23    Assessment/Plan:   Principal Problem:   Symptomatic anemia Active Problems:   Coronary artery disease involving native artery of transplanted heart without angina pectoris   Hypertension   Diabetes mellitus (HCC)  Patient Summary: Vincent Guerrero is a 59 y.o. year old male with hx of  CAD s/p RCA stent, MI in 2010, moderate aortic stenosis, CVA, HTN, DM, HLD presenting for DOE, anemia, weight loss, and fatigue for the past year, worsening recently. Was sent here from his cardiologist for worsening anemia. Received 2 units while in ED. Will consult GI and  cardiology for further GI workup and need for cardiac clearance.   Symptomatic microcytic anemia  Hgb 7.8>9.2. Iron studies consistent with microcytic anemia/iron deficiency. Improved after 2 units of PRBC. Hx of heavy BC Powder use. DDx includes colon cancer vs gastric ulcer. Abdominal pain has resolved. -consult GI for endoscopy/colonoscopy vs outpatient GI referral, keep patient NPO -consult cardiology for cardiac clearance for any GI procedure needed -protonix 40mg  BID -daily CBC -f/u with PCP for alternative back pain regimen  Chest pain with EKG changes CAD s/p MI and RCA stent in 2010 Moderate aortic stenosis Has good cardiology follow up. L heart cath in 2018 with total occlusion of RCA which was previously stented, 30% stenosis of L main, 40% stenosis of proximal LAD. Cardiology planning for repeat cath due to increasing SOB and CP, but postponed due to anemia. Echo on 10/19/19 with LVEF 60-65%, grade 1 diastolic dysfunction, moderate aortic stenosis. No active chest pain. EKG with new t-wave inversion in lead II - Troponin of 7 -consult cardiology for clearance for potential GI procedure  Diet: NPO IVF: None,None VTE: SCDs Code: Full PT/OT recs: None, none.  Dispo: Anticipated discharge to Home in 1-2 days pending cardiac clearance and GI workup   10/21/19, DO PGY-1 Internal Medicine Teaching Service Pager: [phone] 11/26/2019 Please contact the on call pager after 5 pm and on weekends at [phone].

## 2019-11-26 NOTE — ED Notes (Signed)
Pt states he is aware that he was told he is not supposed to eat until his providers decide on a plan of care, but decided to eat food that his daughter brought anyway Pt educated on reason why he has been placed on a NPO diet.  Pt told again that he needs to stop eating, last food intake was 1230

## 2019-11-26 NOTE — Consult Note (Addendum)
Referring Provider:  [facility]         Primary Care Physician:  Simone Curia, MD Primary Gastroenterologist:  unassigned           We were asked to see this patient for:    anemia              ASSESSMENT / PLAN:   # 59 yo male with pmh significant for, not necessarily limited to:  CAD / MI and stent in 2010, moderate aortic stenosis, CVA, HTN, DM, and  hyperlipidemia.  # Symptomatic iron deficiency anemia in setting of heavy, chronic NSAID use. No overt GI blood loss. Rule out NSAID induced mucosal injury. Rule out colon neoplasm --Hgb 7.9, improved to 9.2 post 2 units of blood --Most likely will need both EGD and colonoscopy this admission if stable from cardiac standpoint. Troponin 7 --Oral BID PPI is okay at this point. --Clear liquid diet only for now .     HPI:                                                                                                                             Chief Complaint: anemia  Vincent Guerrero is a 59 y.o. male with a pmh significant for, not necessarily limited to:  CAD / MI and stent in 2010, moderate aortic stenosis, CVA, HTN, DM, hyperlipidemia.   Patient has been having shortness of breath and chest discomfort with mild exertion. Cardiology was working him up for a cardiac cath at which time labs showed he was severely anemia with a hgb in 8 range. Patient sent to ED by Cardiologist. In ED hgb was 7.9. Patient denies any overt GIB bleeding / dark stools. No abdominal pain. No N/V. No weight loss. No FMH of colon cancer. Patient says he was told he was anemic and was referred to GI but then pandemic started and appointment was postponed.   Patient has taken several BC powders for back pain for many years.    Data Review:   11/25/19 EKG: EKG Interpretation  Date/Time:                  Wednesday November 25 2019 16:02:24 EDT Ventricular Rate:         79 PR Interval:                 158 QRS Duration: 92 QT Interval:                  358 QTC Calculation:        410 R Axis:                         94 Text Interpretation:      Normal sinus rhythm Rightward axis Pulmonary disease pattern Minimal voltage criteria for LVH, may be normal variant ( Cornell product ) T wave abnormality, consider inferior ischemia Abnormal ECG since last  tracing no significant change Confirmed by Wentz, Elliott ([id number]) on 11/25/2019 11:25:52 PM  Labs:  WBC 9.6, hgb 9.2, MCV 66, platelets 243 TIBC 415, 4 % iron sat, ferrin 4 BUN 16, Cr 1.19  PREVIOUS ENDOSCOPIC EVALUATIONS / PERTINENT STUDIES   none    Past Medical History:  Diagnosis Date  . Age-related nuclear cataract of both eyes 11/27/2018  . Anemia   . Bilateral recurrent inguinal hernia without obstruction or gangrene 05/09/2016   Added automatically from request for surgery [id number]  . Chest pressure 11/06/2019  . Coronary artery disease involving native artery of transplanted heart without angina pectoris   . Cortical age-related cataract of both eyes 11/27/2018  . Depression   . Diabetes mellitus (HCC)   . Hyperlipidemia LDL goal <70   . Hypertension   . Inguinal hernia without obstruction or gangrene    BILATERAL  . Primary open angle glaucoma (POAG) of both eyes, severe stage 11/27/2018  . Primary open angle glaucoma (POAG) of left eye, severe stage 11/27/2018  . Tobacco abuse 11/05/2019  . Tobacco use 11/06/2019    Past Surgical History:  Procedure Laterality Date  . HERNIA REPAIR  1998  . LEFT HEART CATH AND CORONARY ANGIOGRAPHY N/A 06/04/2016   Procedure: Left Heart Cath and Coronary Angiography;  Surgeon: Aren Cherne W Smith, MD;  Location: [facility];  Service: Cardiovascular;  Laterality: N/A;    Prior to Admission medications   Medication Sig Start Date End Date Taking? Authorizing Provider  Aspirin-Salicylamide-Caffeine (BC HEADACHE POWDER PO) Take 3-6 packets by mouth See admin instructions. Dissolve 3-6 packets orally daily   Yes [provider]    Evolocumab with Infusor (REPATHA PUSHTRONEX SYSTEM) 420 MG/3.5ML SOCT Inject 420 mg into the skin every 30 (thirty) days.   Yes [provider]  glipiZIDE (GLUCOTROL) 10 MG tablet Take 10 mg by mouth 2 (two) times daily before a meal.  11/19/18  Yes [provider]  isosorbide mononitrate (IMDUR) 30 MG 24 hr tablet Take 60 mg by mouth daily.   Yes [provider]  lisinopril (PRINIVIL,ZESTRIL) 20 MG tablet Take 1 tablet (20 mg total) by mouth 2 (two) times daily. 05/11/16  Yes Thompson, Kathryn R, PA-C  metFORMIN (GLUCOPHAGE) 1000 MG tablet Take 1,000 mg by mouth 2 (two) times daily with a meal.   Yes [provider]  metoprolol (LOPRESSOR) 50 MG tablet Take 1 tablet (50 mg total) by mouth 2 (two) times daily. 05/11/16  Yes Thompson, Kathryn R, PA-C  Multiple Vitamins-Minerals (CENTRUM SILVER 50+MEN) TABS Take 1 tablet by mouth daily with breakfast.   Yes [provider]  Netarsudil-Latanoprost (ROCKLATAN) 0.02-0.005 % SOLN Place 1 drop into the right eye at bedtime.    Yes [provider]  POTASSIUM PO Take 1 tablet by mouth 3 (three) times a week.   Yes [provider]  brimonidine (ALPHAGAN) 0.2 % ophthalmic solution Place 1 drop into the right eye 2 (two) times daily.  Patient not taking: Reported on 11/25/2019 12/16/18   [provider]  buPROPion (ZYBAN) 150 MG 12 hr tablet Start with 1 tablet daily for 3 days, then increase to 1 tablet twice daily.  Start 1 week before your quit date. 11/05/19   Tobb, Kardie, DO  Difluprednate 0.05 % EMUL Apply 1 drop to eye in the morning, at noon, in the evening, and at bedtime.  Patient not taking: Reported on 11/25/2019 12/24/18   [provider]  isosorbide mononitrate (IMDUR) 60 MG   24 hr tablet Take 1 tablet (60 mg total) by mouth daily. 11/06/19 02/04/20  Tobb, Kardie, DO  potassium chloride SA (KLOR-CON M20) 20 MEQ tablet Take 1 tab (20 meq) on Tues,Thurs,and Saturdays Patient  not taking: Reported on 11/25/2019 12/10/18   Tobb, Kardie, DO    Current Facility-Administered Medications  Medication Dose Route Frequency Provider Last Rate Last Admin  . 0.9 %  sodium chloride infusion (Manually program via Guardrails IV Fluids)   Intravenous Once Christian, Rylee, MD      . acetaminophen (TYLENOL) tablet 650 mg  650 mg Oral Q6H PRN Christian, Rylee, MD       Or  . acetaminophen (TYLENOL) suppository 650 mg  650 mg Rectal Q6H PRN Christian, Rylee, MD      . insulin aspart (novoLOG) injection 0-9 Units  0-9 Units Subcutaneous TID WC Christian, Rylee, MD   2 Units at 11/26/19 0803  . isosorbide mononitrate (IMDUR) 24 hr tablet 60 mg  60 mg Oral Daily Christian, Rylee, MD   60 mg at 11/26/19 0940  . lidocaine (LIDODERM) 5 % 2 patch  2 patch Transdermal Q24H Chen, Joshua Y, MD   2 patch at 11/25/19 2259  . lisinopril (ZESTRIL) tablet 20 mg  20 mg Oral BID Christian, Rylee, MD   20 mg at 11/26/19 0941  . metoprolol tartrate (LOPRESSOR) tablet 50 mg  50 mg Oral BID Christian, Rylee, MD   50 mg at 11/26/19 0941  . Netarsudil-Latanoprost 0.02-0.005 % SOLN 1 drop  1 drop Right Eye QHS Christian, Rylee, MD      . pantoprazole (PROTONIX) EC tablet 40 mg  40 mg Oral BID Christian, Rylee, MD   40 mg at 11/26/19 0941   Current Outpatient Medications  Medication Sig Dispense Refill  . Aspirin-Salicylamide-Caffeine (BC HEADACHE POWDER PO) Take 3-6 packets by mouth See admin instructions. Dissolve 3-6 packets orally daily    . Evolocumab with Infusor (REPATHA PUSHTRONEX SYSTEM) 420 MG/3.5ML SOCT Inject 420 mg into the skin every 30 (thirty) days.    . glipiZIDE (GLUCOTROL) 10 MG tablet Take 10 mg by mouth 2 (two) times daily before a meal.     . isosorbide mononitrate (IMDUR) 30 MG 24 hr tablet Take 60 mg by mouth daily.    . lisinopril (PRINIVIL,ZESTRIL) 20 MG tablet Take 1 tablet (20 mg total) by mouth 2 (two) times daily. 180 tablet 2  . metFORMIN (GLUCOPHAGE) 1000 MG tablet Take 1,000  mg by mouth 2 (two) times daily with a meal.    . metoprolol (LOPRESSOR) 50 MG tablet Take 1 tablet (50 mg total) by mouth 2 (two) times daily. 180 tablet 2  . Multiple Vitamins-Minerals (CENTRUM SILVER 50+MEN) TABS Take 1 tablet by mouth daily with breakfast.    . Netarsudil-Latanoprost (ROCKLATAN) 0.02-0.005 % SOLN Place 1 drop into the right eye at bedtime.     . POTASSIUM PO Take 1 tablet by mouth 3 (three) times a week.    . brimonidine (ALPHAGAN) 0.2 % ophthalmic solution Place 1 drop into the right eye 2 (two) times daily.  (Patient not taking: Reported on 11/25/2019)    . buPROPion (ZYBAN) 150 MG 12 hr tablet Start with 1 tablet daily for 3 days, then increase to 1 tablet twice daily.  Start 1 week before your quit date. 60 tablet 3  . Difluprednate 0.05 % EMUL Apply 1 drop to eye in the morning, at noon, in the evening, and at bedtime.  (Patient not taking: Reported on 11/25/2019)    .   isosorbide mononitrate (IMDUR) 60 MG 24 hr tablet Take 1 tablet (60 mg total) by mouth daily. 90 tablet 1  . potassium chloride SA (KLOR-CON M20) 20 MEQ tablet Take 1 tab (20 meq) on Tues,Thurs,and Saturdays (Patient not taking: Reported on 11/25/2019) 45 tablet 1    Allergies as of 11/25/2019  . (No Known Allergies)    Family History  Problem Relation Age of Onset  . Heart failure Mother   . Hypertension Mother   . CAD Father   . Diabetes Father   . Prostate cancer Brother     Social History   Socioeconomic History  . Marital status: Legally Separated    Spouse name: Not on file  . Number of children: Not on file  . Years of education: Not on file  . Highest education level: Not on file  Occupational History  . Not on file  Tobacco Use  . Smoking status: Current Every Day Smoker    Packs/day: 1.00    Years: 40.00    Pack years: 40.00    Types: Cigarettes  . Smokeless tobacco: Current User    Types: Chew  Vaping Use  . Vaping Use: Never used  Substance and Sexual Activity  .  Alcohol use: No  . Drug use: Yes    Types: Marijuana  . Sexual activity: Yes  Other Topics Concern  . Not on file  Social History Narrative  . Not on file   Social Determinants of Health   Financial Resource Strain:   . Difficulty of Paying Living Expenses: Not on file  Food Insecurity:   . Worried About Programme researcher, broadcasting/film/video in the Last Year: Not on file  . Ran Out of Food in the Last Year: Not on file  Transportation Needs:   . Lack of Transportation (Medical): Not on file  . Lack of Transportation (Non-Medical): Not on file  Physical Activity:   . Days of Exercise per Week: Not on file  . Minutes of Exercise per Session: Not on file  Stress:   . Feeling of Stress : Not on file  Social Connections:   . Frequency of Communication with Friends and Family: Not on file  . Frequency of Social Gatherings with Friends and Family: Not on file  . Attends Religious Services: Not on file  . Active Member of Clubs or Organizations: Not on file  . Attends Banker Meetings: Not on file  . Marital Status: Not on file  Intimate Partner Violence:   . Fear of Current or Ex-Partner: Not on file  . Emotionally Abused: Not on file  . Physically Abused: Not on file  . Sexually Abused: Not on file    Review of Systems: All systems reviewed and negative except where noted in HPI.  OBJECTIVE:    Physical Exam: Vital signs in last 24 hours: Temp:  [97.9 F (36.6 C)-99 F (37.2 C)] 98.1 F (36.7 C) (10/21 0124) Pulse Rate:  [55-90] 61 (10/21 1030) Resp:  [14-21] 17 (10/21 1030) BP: (104-178)/(54-87) 157/81 (10/21 1030) SpO2:  [90 %-100 %] 98 % (10/21 1030) Weight:  [93 kg] 93 kg (10/20 1546)   General:   Alert male in NAD Psych:  Pleasant, cooperative. Normal mood and affect. Eyes:  Pupils equal, sclera clear, no icterus.   Conjunctiva pink. Ears:  Normal auditory acuity. Nose:  No deformity, discharge,  or lesions. Neck:  Supple; no masses Lungs:  Clear throughout to  auscultation.   No wheezes, crackles, or  rhonchi.  Heart:  Regular rate and rhythm; harsh systolic murmur, no lower extremity edema Abdomen:  Soft, non-distended, nontender, BS active, no palp mass   Rectal:  Deferred  Msk:  Symmetrical without gross deformities. . Neurologic:  Alert and  oriented x4;  grossly normal neurologically. Skin:  Intact without significant lesions or rashes.  Filed Weights   11/25/19 1546  Weight: 93 kg     Scheduled inpatient medications . sodium chloride   Intravenous Once  . insulin aspart  0-9 Units Subcutaneous TID WC  . isosorbide mononitrate  60 mg Oral Daily  . lidocaine  2 patch Transdermal Q24H  . lisinopril  20 mg Oral BID  . metoprolol tartrate  50 mg Oral BID  . Netarsudil-Latanoprost  1 drop Right Eye QHS  . pantoprazole  40 mg Oral BID      Intake/Output from previous day: No intake/output data recorded. Intake/Output this shift: No intake/output data recorded.   Lab Results: Recent Labs    11/24/19 1557 11/25/19 1554 11/26/19 0430  WBC 10.1 9.7 9.6  HGB 7.9* 7.8* 9.2*  HCT 28.4* 29.4* 32.4*  PLT 303 279 243   BMET Recent Labs    11/24/19 1557 11/25/19 1554 11/26/19 0523  NA 140 138  --   K 4.5 4.2  --   CL 104 105  --   CO2 22 24  --   GLUCOSE 238* 200*  --   BUN 16 16  --   CREATININE 0.99 1.19 0.97  CALCIUM 9.0 8.9  --    LFT No results for input(s): PROT, ALBUMIN, AST, ALT, ALKPHOS, BILITOT, BILIDIR, IBILI in the last 72 hours. PT/INR No results for input(s): LABPROT, INR in the last 72 hours. Hepatitis Panel No results for input(s): HEPBSAG, HCVAB, HEPAIGM, HEPBIGM in the last 72 hours.  10/14/2019 echocardiogram:  1. Left ventricular ejection fraction, by estimation, is 60 to 65%. The  left ventricle has normal function. The left ventricle has no regional  wall motion abnormalities. There is moderate left ventricular hypertrophy.  Left ventricular diastolic  parameters are consistent with Grade I  diastolic dysfunction (impaired  relaxation).   2. Right ventricular systolic function is normal. The right ventricular  size is normal. There is normal pulmonary artery systolic pressure.   3. The mitral valve is normal in structure. No evidence of mitral valve  regurgitation. No evidence of mitral stenosis.   4. The aortic valve is normal in structure. Aortic valve regurgitation is  moderate. Moderate aortic valve stenosis.   5. The inferior vena cava is normal in size with greater than 50%  respiratory variability, suggesting right atrial pressure of 3 mmHg.    . CBC Latest Ref Rng & Units 11/26/2019 11/25/2019 11/24/2019  WBC 4.0 - 10.5 K/uL 9.6 9.7 10.1  Hemoglobin 13.0 - 17.0 g/dL 9.2(L) 7.8(L) 7.9(L)  Hematocrit 39 - 52 % 32.4(L) 29.4(L) 28.4(L)  Platelets 150 - 400 K/uL 243 279 303    . CMP Latest Ref Rng & Units 11/26/2019 11/25/2019 11/24/2019  Glucose 70 - 99 mg/dL - 200(H) 238(H)  BUN 6 - 20 mg/dL - 16 16  Creatinine 0.61 - 1.24 mg/dL 0.97 1.19 0.99  Sodium 135 - 145 mmol/L - 138 140  Potassium 3.5 - 5.1 mmol/L - 4.2 4.5  Chloride 98 - 111 mmol/L - 105 104  CO2 22 - 32 mmol/L - 24 22  Calcium 8.9 - 10.3 mg/dL - 8.9 9.0   Studies/Results: DG Chest Portable 1 View    Result Date: 11/25/2019 CLINICAL DATA:  Fatigue. EXAM: PORTABLE CHEST 1 VIEW COMPARISON:  Prior chest radiographs 10/30/2019. FINDINGS: Please note portions of the lateral costophrenic angles are excluded from the field of view bilaterally. Heart size within normal limits. No appreciable airspace consolidation or pulmonary edema. No evidence of pleural effusion or pneumothorax. No acute bony abnormality identified. IMPRESSION: Portions of the lateral costophrenic angles are excluded from the field of view bilaterally. No evidence of active cardiopulmonary disease. Electronically Signed   By: Jackey Loge DO   On: 11/25/2019 18:23    Principal Problem:   Symptomatic anemia Active Problems:   Coronary  artery disease involving native artery of transplanted heart without angina pectoris   Hypertension   Diabetes mellitus (HCC)    Willette Cluster, NP-C @  11/26/2019, 11:43 AM  I have reviewed the entire case in detail with the above APP and discussed the plan in detail.  Therefore, I agree with the diagnoses recorded above. In addition,  I have personally interviewed and examined the patient and have personally reviewed any abdominal/pelvic CT scan images.  My additional thoughts are as follows:  Severe iron deficiency anemia, improved after transfusion.  Recent cardiac symptoms of exertional chest pain and dyspnea.  The original plan was for cardiac catheterization until anemia was discovered.  Patient does not have abdominal pain, altered bowel habits, passage of black or bloody stool, nausea, vomiting, dysphagia anorexia or weight loss.  He was planning to have a screening colonoscopy a couple of years ago, but then Covid occurred and he could not get it done.  He does not know of any family history of esophageal, gastric or colon cancer.  This patient was about to be evaluated by the cardiology PA as I was finishing up my encounter, and the attending physician will be along later.  If they agree this patient is not currently showing signs of acute coronary syndrome, then we will perform upper endoscopy and colonoscopy next, and cardiology can then make a decision about cardiac catheterization.  Naturally, they would be concerned about placing any coronary stents that would require antiplatelet therapy with an unknown cause of iron deficiency anemia that could then cause bleeding.  This patient consumes heavy doses of Goody powder, thus raising concern for upper GI ulcer.  Is also never been screened for colon cancer.  Upper endoscopy and colonoscopy are planned for tomorrow, pending final evaluation of cardiology attending.  Procedures were described in detail along with risks and benefits with  his daughter present and Mr. Krass was agreeable.  The benefits and risks of the planned procedure were described in detail with the patient or (when appropriate) their health care proxy.  Risks were outlined as including, but not limited to, bleeding, infection, perforation, adverse medication reaction leading to cardiac or pulmonary decompensation, pancreatitis (if ERCP).  The limitation of incomplete mucosal visualization was also discussed.  No guarantees or warranties were given.  Patient at increased risk for cardiopulmonary complications of procedure due to medical comorbidities.(Coronary artery disease of unknown status, moderate aortic stenosis on recent echocardiogram)  Further plans to follow   [name] Office:[phone]

## 2019-11-26 NOTE — H&P (View-Only) (Signed)
Referring Provider:  [facility]         Primary Care Physician:  Simone Curia, MD Primary Gastroenterologist:  unassigned           We were asked to see this patient for:    anemia              ASSESSMENT / PLAN:   # 59 yo male with pmh significant for, not necessarily limited to:  CAD / MI and stent in 2010, moderate aortic stenosis, CVA, HTN, DM, and  hyperlipidemia.  # Symptomatic iron deficiency anemia in setting of heavy, chronic NSAID use. No overt GI blood loss. Rule out NSAID induced mucosal injury. Rule out colon neoplasm --Hgb 7.9, improved to 9.2 post 2 units of blood --Most likely will need both EGD and colonoscopy this admission if stable from cardiac standpoint. Troponin 7 --Oral BID PPI is okay at this point. --Clear liquid diet only for now .     HPI:                                                                                                                             Chief Complaint: anemia  Vincent Guerrero is a 59 y.o. male with a pmh significant for, not necessarily limited to:  CAD / MI and stent in 2010, moderate aortic stenosis, CVA, HTN, DM, hyperlipidemia.   Patient has been having shortness of breath and chest discomfort with mild exertion. Cardiology was working him up for a cardiac cath at which time labs showed he was severely anemia with a hgb in 8 range. Patient sent to ED by Cardiologist. In ED hgb was 7.9. Patient denies any overt GIB bleeding / dark stools. No abdominal pain. No N/V. No weight loss. No FMH of colon cancer. Patient says he was told he was anemic and was referred to GI but then pandemic started and appointment was postponed.   Patient has taken several BC powders for back pain for many years.    Data Review:   11/25/19 EKG: EKG Interpretation  Date/Time:                  Wednesday November 25 2019 16:02:24 EDT Ventricular Rate:         79 PR Interval:                 158 QRS Duration: 92 QT Interval:                  358 QTC Calculation:        410 R Axis:                         94 Text Interpretation:      Normal sinus rhythm Rightward axis Pulmonary disease pattern Minimal voltage criteria for LVH, may be normal variant ( Cornell product ) T wave abnormality, consider inferior ischemia Abnormal ECG since last  tracing no significant change Confirmed by Mancel BaleWentz, Elliott [phone]) on 11/25/2019 11:25:52 PM  Labs:  WBC 9.6, hgb 9.2, MCV 66, platelets 243 TIBC 415, 4 % iron sat, ferrin 4 BUN 16, Cr 1.19  PREVIOUS ENDOSCOPIC EVALUATIONS / PERTINENT STUDIES   none    Past Medical History:  Diagnosis Date  . Age-related nuclear cataract of both eyes 11/27/2018  . Anemia   . Bilateral recurrent inguinal hernia without obstruction or gangrene 05/09/2016   Added automatically from request for surgery [id number]  . Chest pressure 11/06/2019  . Coronary artery disease involving native artery of transplanted heart without angina pectoris   . Cortical age-related cataract of both eyes 11/27/2018  . Depression   . Diabetes mellitus (HCC)   . Hyperlipidemia LDL goal <70   . Hypertension   . Inguinal hernia without obstruction or gangrene    BILATERAL  . Primary open angle glaucoma (POAG) of both eyes, severe stage 11/27/2018  . Primary open angle glaucoma (POAG) of left eye, severe stage 11/27/2018  . Tobacco abuse 11/05/2019  . Tobacco use 11/06/2019    Past Surgical History:  Procedure Laterality Date  . HERNIA REPAIR  1998  . LEFT HEART CATH AND CORONARY ANGIOGRAPHY N/A 06/04/2016   Procedure: Left Heart Cath and Coronary Angiography;  Surgeon: Lyn RecordsHenry W Smith, MD;  [facility];  Service: Cardiovascular;  Laterality: N/A;    Prior to Admission medications   Medication Sig Start Date End Date Taking? Authorizing Provider  Aspirin-Salicylamide-Caffeine (BC HEADACHE POWDER PO) Take 3-6 packets by mouth See admin instructions. Dissolve 3-6 packets orally daily   Yes [provider]    Evolocumab with Infusor (REPATHA PUSHTRONEX SYSTEM) 420 MG/3.5ML SOCT Inject 420 mg into the skin every 30 (thirty) days.   Yes [provider]  glipiZIDE (GLUCOTROL) 10 MG tablet Take 10 mg by mouth 2 (two) times daily before a meal.  11/19/18  Yes [provider]  isosorbide mononitrate (IMDUR) 30 MG 24 hr tablet Take 60 mg by mouth daily.   Yes [provider]  lisinopril (PRINIVIL,ZESTRIL) 20 MG tablet Take 1 tablet (20 mg total) by mouth 2 (two) times daily. 05/11/16  Yes Janetta Horahompson, Kathryn R, PA-C  metFORMIN (GLUCOPHAGE) 1000 MG tablet Take 1,000 mg by mouth 2 (two) times daily with a meal.   Yes [provider]  metoprolol (LOPRESSOR) 50 MG tablet Take 1 tablet (50 mg total) by mouth 2 (two) times daily. 05/11/16  Yes Janetta Horahompson, Kathryn R, PA-C  Multiple Vitamins-Minerals (CENTRUM SILVER 50+MEN) TABS Take 1 tablet by mouth daily with breakfast.   Yes [provider]  Netarsudil-Latanoprost (ROCKLATAN) 0.02-0.005 % SOLN Place 1 drop into the right eye at bedtime.    Yes [provider]  POTASSIUM PO Take 1 tablet by mouth 3 (three) times a week.   Yes [provider]  brimonidine (ALPHAGAN) 0.2 % ophthalmic solution Place 1 drop into the right eye 2 (two) times daily.  Patient not taking: Reported on 11/25/2019 12/16/18   [provider]  buPROPion (ZYBAN) 150 MG 12 hr tablet Start with 1 tablet daily for 3 days, then increase to 1 tablet twice daily.  Start 1 week before your quit date. 11/05/19   Tobb, Kardie, DO  Difluprednate 0.05 % EMUL Apply 1 drop to eye in the morning, at noon, in the evening, and at bedtime.  Patient not taking: Reported on 11/25/2019 12/24/18   [provider]  isosorbide mononitrate (IMDUR) 60 MG  24 hr tablet Take 1 tablet (60 mg total) by mouth daily. 11/06/19 02/04/20  Tobb, Kardie, DO  potassium chloride SA (KLOR-CON M20) 20 MEQ tablet Take 1 tab (20 meq) on Tues,Thurs,and Saturdays Patient  not taking: Reported on 11/25/2019 12/10/18   Tobb, Lavona Mound, DO    Current Facility-Administered Medications  Medication Dose Route Frequency Provider Last Rate Last Admin  . 0.9 %  sodium chloride infusion (Manually program via Guardrails IV Fluids)   Intravenous Once Christian, Rylee, MD      . acetaminophen (TYLENOL) tablet 650 mg  650 mg Oral Q6H PRN Christian, Rylee, MD       Or  . acetaminophen (TYLENOL) suppository 650 mg  650 mg Rectal Q6H PRN Christian, Rylee, MD      . insulin aspart (novoLOG) injection 0-9 Units  0-9 Units Subcutaneous TID WC Elige Radon, MD   2 Units at 11/26/19 0803  . isosorbide mononitrate (IMDUR) 24 hr tablet 60 mg  60 mg Oral Daily Christian, Rylee, MD   60 mg at 11/26/19 0940  . lidocaine (LIDODERM) 5 % 2 patch  2 patch Transdermal Q24H Remo Lipps, MD   2 patch at 11/25/19 2259  . lisinopril (ZESTRIL) tablet 20 mg  20 mg Oral BID Ephriam Knuckles, Rylee, MD   20 mg at 11/26/19 0941  . metoprolol tartrate (LOPRESSOR) tablet 50 mg  50 mg Oral BID Ephriam Knuckles, Rylee, MD   50 mg at 11/26/19 0941  . Netarsudil-Latanoprost 0.02-0.005 % SOLN 1 drop  1 drop Right Eye QHS Christian, Rylee, MD      . pantoprazole (PROTONIX) EC tablet 40 mg  40 mg Oral BID Ephriam Knuckles, Rylee, MD   40 mg at 11/26/19 3335   Current Outpatient Medications  Medication Sig Dispense Refill  . Aspirin-Salicylamide-Caffeine (BC HEADACHE POWDER PO) Take 3-6 packets by mouth See admin instructions. Dissolve 3-6 packets orally daily    . Evolocumab with Infusor (REPATHA PUSHTRONEX SYSTEM) 420 MG/3.5ML SOCT Inject 420 mg into the skin every 30 (thirty) days.    Marland Kitchen glipiZIDE (GLUCOTROL) 10 MG tablet Take 10 mg by mouth 2 (two) times daily before a meal.     . isosorbide mononitrate (IMDUR) 30 MG 24 hr tablet Take 60 mg by mouth daily.    Marland Kitchen lisinopril (PRINIVIL,ZESTRIL) 20 MG tablet Take 1 tablet (20 mg total) by mouth 2 (two) times daily. 180 tablet 2  . metFORMIN (GLUCOPHAGE) 1000 MG tablet Take 1,000  mg by mouth 2 (two) times daily with a meal.    . metoprolol (LOPRESSOR) 50 MG tablet Take 1 tablet (50 mg total) by mouth 2 (two) times daily. 180 tablet 2  . Multiple Vitamins-Minerals (CENTRUM SILVER 50+MEN) TABS Take 1 tablet by mouth daily with breakfast.    . Netarsudil-Latanoprost (ROCKLATAN) 0.02-0.005 % SOLN Place 1 drop into the right eye at bedtime.     Marland Kitchen POTASSIUM PO Take 1 tablet by mouth 3 (three) times a week.    . brimonidine (ALPHAGAN) 0.2 % ophthalmic solution Place 1 drop into the right eye 2 (two) times daily.  (Patient not taking: Reported on 11/25/2019)    . buPROPion (ZYBAN) 150 MG 12 hr tablet Start with 1 tablet daily for 3 days, then increase to 1 tablet twice daily.  Start 1 week before your quit date. 60 tablet 3  . Difluprednate 0.05 % EMUL Apply 1 drop to eye in the morning, at noon, in the evening, and at bedtime.  (Patient not taking: Reported on 11/25/2019)    .  isosorbide mononitrate (IMDUR) 60 MG 24 hr tablet Take 1 tablet (60 mg total) by mouth daily. 90 tablet 1  . potassium chloride SA (KLOR-CON M20) 20 MEQ tablet Take 1 tab (20 meq) on Tues,Thurs,and Saturdays (Patient not taking: Reported on 11/25/2019) 45 tablet 1    Allergies as of 11/25/2019  . (No Known Allergies)    Family History  Problem Relation Age of Onset  . Heart failure Mother   . Hypertension Mother   . CAD Father   . Diabetes Father   . Prostate cancer Brother     Social History   Socioeconomic History  . Marital status: Legally Separated    Spouse name: Not on file  . Number of children: Not on file  . Years of education: Not on file  . Highest education level: Not on file  Occupational History  . Not on file  Tobacco Use  . Smoking status: Current Every Day Smoker    Packs/day: 1.00    Years: 40.00    Pack years: 40.00    Types: Cigarettes  . Smokeless tobacco: Current User    Types: Chew  Vaping Use  . Vaping Use: Never used  Substance and Sexual Activity  .  Alcohol use: No  . Drug use: Yes    Types: Marijuana  . Sexual activity: Yes  Other Topics Concern  . Not on file  Social History Narrative  . Not on file   Social Determinants of Health   Financial Resource Strain:   . Difficulty of Paying Living Expenses: Not on file  Food Insecurity:   . Worried About Programme researcher, broadcasting/film/video in the Last Year: Not on file  . Ran Out of Food in the Last Year: Not on file  Transportation Needs:   . Lack of Transportation (Medical): Not on file  . Lack of Transportation (Non-Medical): Not on file  Physical Activity:   . Days of Exercise per Week: Not on file  . Minutes of Exercise per Session: Not on file  Stress:   . Feeling of Stress : Not on file  Social Connections:   . Frequency of Communication with Friends and Family: Not on file  . Frequency of Social Gatherings with Friends and Family: Not on file  . Attends Religious Services: Not on file  . Active Member of Clubs or Organizations: Not on file  . Attends Banker Meetings: Not on file  . Marital Status: Not on file  Intimate Partner Violence:   . Fear of Current or Ex-Partner: Not on file  . Emotionally Abused: Not on file  . Physically Abused: Not on file  . Sexually Abused: Not on file    Review of Systems: All systems reviewed and negative except where noted in HPI.  OBJECTIVE:    Physical Exam: Vital signs in last 24 hours: Temp:  [97.9 F (36.6 C)-99 F (37.2 C)] 98.1 F (36.7 C) (10/21 0124) Pulse Rate:  [55-90] 61 (10/21 1030) Resp:  [14-21] 17 (10/21 1030) BP: (104-178)/(54-87) 157/81 (10/21 1030) SpO2:  [90 %-100 %] 98 % (10/21 1030) Weight:  [93 kg] 93 kg (10/20 1546)   General:   Alert male in NAD Psych:  Pleasant, cooperative. Normal mood and affect. Eyes:  Pupils equal, sclera clear, no icterus.   Conjunctiva pink. Ears:  Normal auditory acuity. Nose:  No deformity, discharge,  or lesions. Neck:  Supple; no masses Lungs:  Clear throughout to  auscultation.   No wheezes, crackles, or  rhonchi.  Heart:  Regular rate and rhythm; harsh systolic murmur, no lower extremity edema Abdomen:  Soft, non-distended, nontender, BS active, no palp mass   Rectal:  Deferred  Msk:  Symmetrical without gross deformities. . Neurologic:  Alert and  oriented x4;  grossly normal neurologically. Skin:  Intact without significant lesions or rashes.  Filed Weights   11/25/19 1546  Weight: 93 kg     Scheduled inpatient medications . sodium chloride   Intravenous Once  . insulin aspart  0-9 Units Subcutaneous TID WC  . isosorbide mononitrate  60 mg Oral Daily  . lidocaine  2 patch Transdermal Q24H  . lisinopril  20 mg Oral BID  . metoprolol tartrate  50 mg Oral BID  . Netarsudil-Latanoprost  1 drop Right Eye QHS  . pantoprazole  40 mg Oral BID      Intake/Output from previous day: No intake/output data recorded. Intake/Output this shift: No intake/output data recorded.   Lab Results: Recent Labs    11/24/19 1557 11/25/19 1554 11/26/19 0430  WBC 10.1 9.7 9.6  HGB 7.9* 7.8* 9.2*  HCT 28.4* 29.4* 32.4*  PLT 303 279 243   BMET Recent Labs    11/24/19 1557 11/25/19 1554 11/26/19 0523  NA 140 138  --   K 4.5 4.2  --   CL 104 105  --   CO2 22 24  --   GLUCOSE 238* 200*  --   BUN 16 16  --   CREATININE 0.99 1.19 0.97  CALCIUM 9.0 8.9  --    LFT No results for input(s): PROT, ALBUMIN, AST, ALT, ALKPHOS, BILITOT, BILIDIR, IBILI in the last 72 hours. PT/INR No results for input(s): LABPROT, INR in the last 72 hours. Hepatitis Panel No results for input(s): HEPBSAG, HCVAB, HEPAIGM, HEPBIGM in the last 72 hours.  10/14/2019 echocardiogram:  1. Left ventricular ejection fraction, by estimation, is 60 to 65%. The  left ventricle has normal function. The left ventricle has no regional  wall motion abnormalities. There is moderate left ventricular hypertrophy.  Left ventricular diastolic  parameters are consistent with Grade I  diastolic dysfunction (impaired  relaxation).   2. Right ventricular systolic function is normal. The right ventricular  size is normal. There is normal pulmonary artery systolic pressure.   3. The mitral valve is normal in structure. No evidence of mitral valve  regurgitation. No evidence of mitral stenosis.   4. The aortic valve is normal in structure. Aortic valve regurgitation is  moderate. Moderate aortic valve stenosis.   5. The inferior vena cava is normal in size with greater than 50%  respiratory variability, suggesting right atrial pressure of 3 mmHg.    . CBC Latest Ref Rng & Units 11/26/2019 11/25/2019 11/24/2019  WBC 4.0 - 10.5 K/uL 9.6 9.7 10.1  Hemoglobin 13.0 - 17.0 g/dL 9.1(Y) 7.8(L) 7.9(L)  Hematocrit 39 - 52 % 32.4(L) 29.4(L) 28.4(L)  Platelets 150 - 400 K/uL 243 279 303    . CMP Latest Ref Rng & Units 11/26/2019 11/25/2019 11/24/2019  Glucose 70 - 99 mg/dL - 782(N) 562(Z)  BUN 6 - 20 mg/dL - 16 16  Creatinine 3.08 - 1.24 mg/dL 6.57 8.46 9.62  Sodium 135 - 145 mmol/L - 138 140  Potassium 3.5 - 5.1 mmol/L - 4.2 4.5  Chloride 98 - 111 mmol/L - 105 104  CO2 22 - 32 mmol/L - 24 22  Calcium 8.9 - 10.3 mg/dL - 8.9 9.0   Studies/Results: DG Chest Portable 1 View  Result Date: 11/25/2019 CLINICAL DATA:  Fatigue. EXAM: PORTABLE CHEST 1 VIEW COMPARISON:  Prior chest radiographs 10/30/2019. FINDINGS: Please note portions of the lateral costophrenic angles are excluded from the field of view bilaterally. Heart size within normal limits. No appreciable airspace consolidation or pulmonary edema. No evidence of pleural effusion or pneumothorax. No acute bony abnormality identified. IMPRESSION: Portions of the lateral costophrenic angles are excluded from the field of view bilaterally. No evidence of active cardiopulmonary disease. Electronically Signed   By: Jackey Loge DO   On: 11/25/2019 18:23    Principal Problem:   Symptomatic anemia Active Problems:   Coronary  artery disease involving native artery of transplanted heart without angina pectoris   Hypertension   Diabetes mellitus (HCC)    Willette Cluster, NP-C @  11/26/2019, 11:43 AM  I have reviewed the entire case in detail with the above APP and discussed the plan in detail.  Therefore, I agree with the diagnoses recorded above. In addition,  I have personally interviewed and examined the patient and have personally reviewed any abdominal/pelvic CT scan images.  My additional thoughts are as follows:  Severe iron deficiency anemia, improved after transfusion.  Recent cardiac symptoms of exertional chest pain and dyspnea.  The original plan was for cardiac catheterization until anemia was discovered.  Patient does not have abdominal pain, altered bowel habits, passage of black or bloody stool, nausea, vomiting, dysphagia anorexia or weight loss.  He was planning to have a screening colonoscopy a couple of years ago, but then Covid occurred and he could not get it done.  He does not know of any family history of esophageal, gastric or colon cancer.  This patient was about to be evaluated by the cardiology PA as I was finishing up my encounter, and the attending physician will be along later.  If they agree this patient is not currently showing signs of acute coronary syndrome, then we will perform upper endoscopy and colonoscopy next, and cardiology can then make a decision about cardiac catheterization.  Naturally, they would be concerned about placing any coronary stents that would require antiplatelet therapy with an unknown cause of iron deficiency anemia that could then cause bleeding.  This patient consumes heavy doses of Goody powder, thus raising concern for upper GI ulcer.  Is also never been screened for colon cancer.  Upper endoscopy and colonoscopy are planned for tomorrow, pending final evaluation of cardiology attending.  Procedures were described in detail along with risks and benefits with  his daughter present and Mr. Krass was agreeable.  The benefits and risks of the planned procedure were described in detail with the patient or (when appropriate) their health care proxy.  Risks were outlined as including, but not limited to, bleeding, infection, perforation, adverse medication reaction leading to cardiac or pulmonary decompensation, pancreatitis (if ERCP).  The limitation of incomplete mucosal visualization was also discussed.  No guarantees or warranties were given.  Patient at increased risk for cardiopulmonary complications of procedure due to medical comorbidities.(Coronary artery disease of unknown status, moderate aortic stenosis on recent echocardiogram)  Further plans to follow   [name] Office:[phone]

## 2019-11-27 ENCOUNTER — Encounter (HOSPITAL_COMMUNITY)
Admission: EM | Disposition: A | Discharge status: Home / Self Care | Payer: Self-pay | Source: Home / Self Care | Attending: Internal Medicine

## 2019-11-27 ENCOUNTER — Encounter (HOSPITAL_COMMUNITY): Payer: Self-pay | Admitting: Internal Medicine

## 2019-11-27 ENCOUNTER — Inpatient Hospital Stay (HOSPITAL_COMMUNITY): Payer: BLUE CROSS/BLUE SHIELD | Admitting: Anesthesiology

## 2019-11-27 DIAGNOSIS — R195 Other fecal abnormalities: Secondary | ICD-10-CM

## 2019-11-27 DIAGNOSIS — K259 Gastric ulcer, unspecified as acute or chronic, without hemorrhage or perforation: Secondary | ICD-10-CM

## 2019-11-27 DIAGNOSIS — D500 Iron deficiency anemia secondary to blood loss (chronic): Secondary | ICD-10-CM | POA: Diagnosis present

## 2019-11-27 HISTORY — PX: COLONOSCOPY WITH PROPOFOL: SHX5780

## 2019-11-27 HISTORY — PX: HEMOSTASIS CLIP PLACEMENT: SHX6857

## 2019-11-27 HISTORY — PX: ESOPHAGOGASTRODUODENOSCOPY (EGD) WITH PROPOFOL: SHX5813

## 2019-11-27 HISTORY — PX: BIOPSY: SHX5522

## 2019-11-27 LAB — CBC
HCT: 37.3 % — ABNORMAL LOW (ref 39.0–52.0)
Hemoglobin: 10.4 g/dL — ABNORMAL LOW (ref 13.0–17.0)
MCH: 18.6 pg — ABNORMAL LOW (ref 26.0–34.0)
MCHC: 27.9 g/dL — ABNORMAL LOW (ref 30.0–36.0)
MCV: 66.8 fL — ABNORMAL LOW (ref 80.0–100.0)
Platelets: 276 10*3/uL (ref 150–400)
RBC: 5.58 MIL/uL (ref 4.22–5.81)
RDW: 24.6 % — ABNORMAL HIGH (ref 11.5–15.5)
WBC: 10.9 10*3/uL — ABNORMAL HIGH (ref 4.0–10.5)
nRBC: 0 % (ref 0.0–0.2)

## 2019-11-27 LAB — GLUCOSE, CAPILLARY
Glucose-Capillary: 97 mg/dL (ref 70–99)
Glucose-Capillary: 126 mg/dL — ABNORMAL HIGH (ref 70–99)
Glucose-Capillary: 200 mg/dL — ABNORMAL HIGH (ref 70–99)
Glucose-Capillary: 180 mg/dL — ABNORMAL HIGH (ref 70–99)

## 2019-11-27 LAB — LIPID PANEL
Cholesterol: 82 mg/dL (ref 0–200)
HDL: 37 mg/dL — ABNORMAL LOW (ref >40)
LDL Cholesterol: 33 mg/dL (ref 0–99)
Total CHOL/HDL Ratio: 2.2 RATIO
Triglycerides: 60 mg/dL (ref <150)
VLDL: 12 mg/dL (ref 0–40)

## 2019-11-27 LAB — HEMOGLOBIN A1C
Hgb A1c MFr Bld: 6.3 % — ABNORMAL HIGH (ref 4.8–5.6)
Mean Plasma Glucose: 134 mg/dL

## 2019-11-27 SURGERY — COLONOSCOPY WITH PROPOFOL
Anesthesia: Monitor Anesthesia Care

## 2019-11-27 MED ORDER — LIDOCAINE 2% (20 MG/ML) 5 ML SYRINGE
INTRAMUSCULAR | Status: DC | PRN
  Administered 2019-11-27: 40 mg via INTRAVENOUS

## 2019-11-27 MED ORDER — LACTATED RINGERS IV SOLN: INTRAVENOUS | Status: DC

## 2019-11-27 MED ORDER — PROPOFOL 500 MG/50ML IV EMUL
INTRAVENOUS | Status: DC | PRN
  Administered 2019-11-27: 125 ug/kg/min via INTRAVENOUS

## 2019-11-27 MED ORDER — PROPOFOL 10 MG/ML IV BOLUS
INTRAVENOUS | Status: DC | PRN
  Administered 2019-11-27 (×2): 20 mg via INTRAVENOUS

## 2019-11-27 MED ORDER — PHENYLEPHRINE 40 MCG/ML (10ML) SYRINGE FOR IV PUSH (FOR BLOOD PRESSURE SUPPORT)
PREFILLED_SYRINGE | INTRAVENOUS | Status: DC | PRN
  Administered 2019-11-27 (×2): 80 ug via INTRAVENOUS

## 2019-11-27 SURGICAL SUPPLY — 25 items

## 2019-11-27 NOTE — Anesthesia Postprocedure Evaluation (Signed)
Anesthesia Post Note  Patient: Vincent Guerrero  Procedure(s) Performed: COLONOSCOPY WITH PROPOFOL (N/A ) ESOPHAGOGASTRODUODENOSCOPY (EGD) WITH PROPOFOL (N/A )     Patient location during evaluation: PACU Anesthesia Type: MAC Level of consciousness: awake and alert Pain management: pain level controlled Vital Signs Assessment: post-procedure vital signs reviewed and stable Respiratory status: spontaneous breathing, nonlabored ventilation and respiratory function stable Cardiovascular status: blood pressure returned to baseline and stable Postop Assessment: no apparent nausea or vomiting Anesthetic complications: no   No complications documented.  Last Vitals:  Vitals:   11/27/19 1233 11/27/19 1404  BP: (!) 152/81 (!) 74/45  Pulse: (!) 58 (!) 56  Resp: 17 15  Temp: 37 C 36.5 C  SpO2: 100% 100%    Last Pain:  Vitals:   11/27/19 1404  TempSrc: Axillary  PainSc:                  Lynda Rainwater

## 2019-11-27 NOTE — Progress Notes (Addendum)
HD#1 Subjective:  Overnight Events: None  Patient resting in bed comfortably. He denies shortness of breath, chest pain. Denies any abdominal pain. Discussed plan for GI to evaluate patient today and possibly perform procedure, patient agrees with plan.   Objective:  Vital signs in last 24 hours: Vitals:   11/26/19 1856 11/26/19 2013 11/27/19 0015 11/27/19 0412  BP: (!) 155/72 (!) 149/74 (!) 153/79 (!) 160/91  Pulse: (!) 59 63 70 68  Resp: 18 18 18 18   Temp: 98.8 F (37.1 C) 98.2 F (36.8 C) 98.4 F (36.9 C) 98.5 F (36.9 C)  TempSrc: Oral Oral Oral Oral  SpO2: 100% 100% 99% 100%  Weight:    91.1 kg  Height:       Supplemental O2:RA SpO2: 100 %   Physical Exam:  Physical Exam Vitals and nursing note reviewed.  Constitutional:      Appearance: Normal appearance.  Cardiovascular:     Rate and Rhythm: Normal rate and regular rhythm.     Pulses: Normal pulses.     Heart sounds: Murmur (3/6 systolic murmur) heard.   Pulmonary:     Effort: Pulmonary effort is normal. No respiratory distress.  Abdominal:     Tenderness: There is abdominal tenderness.  Neurological:     General: No focal deficit present.     Mental Status: He is alert and oriented to person, place, and time. Mental status is at baseline.  Psychiatric:        Mood and Affect: Mood normal.        Behavior: Behavior normal.     Filed Weights   11/25/19 1546 11/27/19 0412  Weight: 93 kg 91.1 kg    No intake or output data in the 24 hours ending 11/27/19 0558 Net IO Since Admission: No IO data has been entered for this period [11/27/19 0558]  Pertinent Labs: CBC Latest Ref Rng & Units 11/27/2019 11/26/2019 11/25/2019  WBC 4.0 - 10.5 K/uL 10.9(H) 9.6 9.7  Hemoglobin 13.0 - 17.0 g/dL 10.4(L) 9.2(L) 7.8(L)  Hematocrit 39 - 52 % 37.3(L) 32.4(L) 29.4(L)  Platelets 150 - 400 K/uL 276 243 279    CMP Latest Ref Rng & Units 11/26/2019 11/25/2019 11/24/2019  Glucose 70 - 99 mg/dL - 11/26/2019) 884(Z)   BUN 6 - 20 mg/dL - 16 16  Creatinine 660(Y - 1.24 mg/dL 3.01 6.01 0.93  Sodium 135 - 145 mmol/L - 138 140  Potassium 3.5 - 5.1 mmol/L - 4.2 4.5  Chloride 98 - 111 mmol/L - 105 104  CO2 22 - 32 mmol/L - 24 22  Calcium 8.9 - 10.3 mg/dL - 8.9 9.0    Imaging: No results found.  Assessment/Plan:   Principal Problem:   Symptomatic anemia Active Problems:   Coronary artery disease involving native artery of transplanted heart without angina pectoris   Hypertension   Diabetes mellitus (HCC)  Patient Summary: Vincent Guerrero is a 59 y.o. year old male with hx of  CAD s/p RCA stent, MI in 2010, moderate aortic stenosis, CVA, HTN, DM, HLD presenting for DOE, anemia, weight loss, and fatigue for the past year, worsening recently. Was sent here from his cardiologist for worsening anemia. Received 2 units while in ED. Cardiac clearance yesterday, awaiting GI recommendations and possible procedure.    Symptomatic Iron deficiency anemia  Hgb 9.2>10.4 improving. Hx of heavy BC Powder use. DDx includes colon cancer vs gastric ulcer. Continues to deny abdominal pain.  -patient cleared by cardiology for GI procedures, follow  up GI recommendations -npo -protonix 40mg  BID -daily CBC -f/u with PCP for alternative back pain regimen   Chest pain with EKG changes CAD s/p MI and RCA stent in 2010 Moderate aortic stenosis Has good cardiology follow up. L heart cath in 2018 with total occlusion of RCA which was previously stented, 30% stenosis of L main, 40% stenosis of proximal LAD. Cardiology planning for repeat cath due to increasing SOB and CP, but postponed due to anemia. Echo on 10/19/19 with LVEF 60-65%, grade 1 diastolic dysfunction, moderate aortic stenosis. No active chest pain. EKG with new t-wave inversion in lead II -patient cleared by cardiology yesterday for GI procedures -cards planning for outpatient left heart catheterization procedure  Diet: NPO IVF: None,None VTE: SCDs Code:  Full PT/OT recs: None, none.  Dispo: Anticipated discharge to Home in 1-2 days pending GI workup.   10/21/19 DO Internal Medicine Resident PGY-1 Pager [phone] Please contact the on call pager after 5 pm and on weekends at [phone].

## 2019-11-27 NOTE — Interval H&P Note (Signed)
History and Physical Interval Note:  11/27/2019 1:11 PM  Vincent Guerrero  has presented today for surgery, with the diagnosis of iron deficiency anemia.  The various methods of treatment have been discussed with the patient and family. After consideration of risks, benefits and other options for treatment, the patient has consented to  Procedure(s): COLONOSCOPY WITH PROPOFOL (N/A) ESOPHAGOGASTRODUODENOSCOPY (EGD) WITH PROPOFOL (N/A) as a surgical intervention.  The patient's history has been reviewed, patient examined, no change in status, stable for surgery.  I have reviewed the patient's chart and labs.  Questions were answered to the patient's satisfaction.    Hgb 10.4 this AM.  Patient stable.  [name]

## 2019-11-27 NOTE — Anesthesia Preprocedure Evaluation (Addendum)
Anesthesia Evaluation    Airway Mallampati: II  TM Distance: >3 FB Neck ROM: Full    Dental no notable dental hx.    Pulmonary Current Smoker and Patient abstained from smoking.,  11/25/2019 SARS coronavirus NEG   Pulmonary exam normal breath sounds clear to auscultation       Cardiovascular hypertension, Pt. 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 The occlusion is aorta ostial.  30% distal left main.  40% proximal LAD diffuse narrowing.  60% segmental narrowing ostium to proximal ramus intermedius.  Normal left ventricular size and function. LVEDP is mildly elevated at 20 mmHg.  Minimal aortic valve gradient peak to peak, 18 mmHg    Neuro/Psych Depression glaucoma    GI/Hepatic (+)     substance abuse  marijuana use,   Endo/Other  diabetes (glu 126), Oral Hypoglycemic Agents  Renal/GU      Musculoskeletal   Abdominal   Peds  Hematology  (+) Blood dyscrasia (Hb 10.4), anemia ,   Anesthesia Other Findings   Reproductive/Obstetrics                            Anesthesia Physical Anesthesia Plan  ASA: III  Anesthesia Plan: MAC   Post-op Pain Management:    Induction: Intravenous  PONV Risk Score and Plan: 0 and Treatment may vary due to age or medical condition  Airway Management Planned: Natural Airway and Nasal Cannula  Additional Equipment: None  Intra-op Plan:   Post-operative Plan:   Informed Consent: I have reviewed the patients History and Physical, chart, labs and discussed the procedure including the risks, benefits and alternatives for the proposed anesthesia with the patient or authorized representative who has indicated his/her understanding and acceptance.      Dental advisory given  Plan Discussed with: CRNA  Anesthesia Plan Comments:        Anesthesia Quick Evaluation

## 2019-11-27 NOTE — Op Note (Signed)
[facility] Patient Name: Vincent Guerrero Procedure Date : 11/27/2019 MRN: [id number] Attending MD: Starr Lake. Myrtie Neither , MD Date of Birth: 07/09/1960 CSN: [id number] Age: 59 Admit Type: Inpatient Procedure:                Upper GI endoscopy Indications:              Iron deficiency anemia secondary to chronic blood                            loss, Heme positive stool Providers:                Sherilyn Cooter L. Myrtie Neither, MD, Bonney Leitz, Lawson Radar,                            Technician Referring MD:             [facility] Medicines:                Monitored Anesthesia Care Complications:            No immediate complications. Estimated Blood Loss:     Estimated blood loss was minimal. Procedure:                Pre-Anesthesia Assessment:                           - Prior to the procedure, a History and Physical                            was performed, and patient medications and                            allergies were reviewed. The patient's tolerance of                            previous anesthesia was also reviewed. The risks                            and benefits of the procedure and the sedation                            options and risks were discussed with the patient.                            All questions were answered, and informed consent                            was obtained. Prior Anticoagulants: The patient has                            taken no previous anticoagulant or antiplatelet                            agents. ASA Grade Assessment: IV - A patient with  severe systemic disease that is a constant threat                            to life. After reviewing the risks and benefits,                            the patient was deemed in satisfactory condition to                            undergo the procedure.                           After obtaining informed consent, the endoscope was                            passed  under direct vision. Throughout the                            procedure, the patient's blood pressure, pulse, and                            oxygen saturations were monitored continuously. The                            GIF-H190 ([id number]) Olympus gastroscope was                            introduced through the mouth, and advanced to the                            second part of duodenum. The upper GI endoscopy was                            accomplished without difficulty. The patient                            tolerated the procedure well. Scope In: Scope Out: Findings:      The esophagus was normal.      Two non-bleeding superficial gastric ulcers were found in the gastric       antrum and pre-pyloric area. The largest lesion was 8 mm in largest       dimension. The antral ulcer had a friable edge. For hemostasis, two       hemostatic clips were successful and one hemostatic clip was       unsuccessfully placed (MR conditional). There was no bleeding at the end       of the procedure. Biopsies were taken from the antrum and gastric body       with a cold forceps for histology to rule out H pylori.      The cardia and gastric fundus were normal on retroflexion.      The examined duodenum was normal. Impression:               - Normal esophagus.                           -  Non-bleeding gastric ulcers. Clips (MR                            conditional) were placed. Biopsied.                           - Normal examined duodenum.                           Despite low grade stigmata on one of the ulcers,                            they were both clipped to expedite healing because                            patient might need cardiac catheterization soon. Recommendation:           - Return patient to hospital ward for ongoing care.                           - Resume regular diet.                           - Use Protonix (pantoprazole) 40 mg PO BID for 6                            weeks.                            - STOP USE OF ASPIRIN-CONTAINING PRODUCTS SUCH AS                            GOODY POWDER.                           - Await pathology results.                           - See the other procedure note for documentation of                            additional recommendations.                           - Patient can undergo cardiac catheterization (and                            stent placement if needed) when clinically                            appropriate. If cardiac symptoms are improved                            enough after transfusion to make it clinically  appropriate to wait, then that would allow time for                            ulcer healing and decrease the chance of bleeding                            if stent were to be placed and anti-platelet                            therapy needed. Procedure Code(s):        --- Professional ---                           [phone], 59, Esophagogastroduodenoscopy, flexible,                            transoral; with control of bleeding, any method                           43239, Esophagogastroduodenoscopy, flexible,                            transoral; with biopsy, single or multiple Diagnosis Code(s):        --- Professional ---                           K25.9, Gastric ulcer, unspecified as acute or                            chronic, without hemorrhage or perforation                           D50.0, Iron deficiency anemia secondary to blood                            loss (chronic)                           R19.5, Other fecal abnormalities CPT copyright 2019 American Medical Association. All rights reserved. The codes documented in this report are preliminary and upon coder review may  be revised to meet current compliance requirements. Jaziah Kwasnik L. Myrtie Neitheranis, MD 11/27/2019 2:14:56 PM This report has been signed electronically. Number of Addenda: 0

## 2019-11-27 NOTE — Progress Notes (Signed)
Reviewed EGD findings with H Danis (GI) I would continue medical Rx   I would not pursue cath unless he is symptomatic when H/H higher.    Symptoms may have been from low Hgb and AS/CAD.  At some point he will need intervention on valve+/- coronary arteries but would like time for healing from GI standpoint.  Will have someone from service reassess pt in am   Would try ambulating a little later today to see how he feels Close f/u of H/H   No NSAIDS  Dietrich Pates MD

## 2019-11-27 NOTE — Progress Notes (Signed)
Progress Note  Patient Name: CHAOS CARLILE Date of Encounter: 11/27/2019  [facility] Cardiologist: Thomasene Ripple, DO   Subjective   Pt comfortable  No chset pressure  No SOB   Inpatient Medications    Scheduled Meds: . sodium chloride   Intravenous Once  . insulin aspart  0-9 Units Subcutaneous TID WC  . isosorbide mononitrate  60 mg Oral Daily  . lidocaine  2 patch Transdermal Q24H  . lisinopril  20 mg Oral BID  . metoprolol tartrate  50 mg Oral BID  . Netarsudil-Latanoprost  1 drop Right Eye QHS  . nicotine  21 mg Transdermal Daily  . pantoprazole  40 mg Oral BID   Continuous Infusions:  PRN Meds: acetaminophen **OR** acetaminophen   Vital Signs    Vitals:   11/26/19 1856 11/26/19 2013 11/27/19 0015 11/27/19 0412  BP: (!) 155/72 (!) 149/74 (!) 153/79 (!) 160/91  Pulse: (!) 59 63 70 68  Resp: 18 18 18 18   Temp: 98.8 F (37.1 C) 98.2 F (36.8 C) 98.4 F (36.9 C) 98.5 F (36.9 C)  TempSrc: Oral Oral Oral Oral  SpO2: 100% 100% 99% 100%  Weight:    91.1 kg  Height:       No intake or output data in the 24 hours ending 11/27/19 0657 Last 3 Weights 11/27/2019 11/25/2019 11/24/2019  Weight (lbs) 200 lb 14.4 oz 205 lb 210 lb 9.6 oz  Weight (kg) 91.128 kg 92.987 kg 95.528 kg      Telemetry    SR  - Personally Reviewed  ECG    No new  - Personally Reviewed  Physical Exam   GEN: No acute distress.   Neck: No JVD  + bruits Cardiac: RRR, III/VI systolic murmur LSB / base Respiratory: Clear to auscultation bilaterally. GI: Soft, nontender, non-distended  MS: No edema; No deformity. Neuro:  Nonfocal  Psych: Normal affect   Labs    High Sensitivity Troponin:   Recent Labs  Lab 11/25/19 2044  TROPONINIHS 7      Chemistry Recent Labs  Lab 11/24/19 1557 11/25/19 1554 11/26/19 0523  NA 140 138  --   K 4.5 4.2  --   CL 104 105  --   CO2 22 24  --   GLUCOSE 238* 200*  --   BUN 16 16  --   CREATININE 0.99 1.19 0.97  CALCIUM 9.0 8.9   --   GFRNONAA 83 >60 >60  GFRAA 96  --   --   ANIONGAP  --  9  --      Hematology Recent Labs  Lab 11/25/19 1554 11/26/19 0430 11/27/19 0226  WBC 9.7 9.6 10.9*  RBC 4.60 4.85 5.58  HGB 7.8* 9.2* 10.4*  HCT 29.4* 32.4* 37.3*  MCV 63.9* 66.8* 66.8*  MCH 17.0* 19.0* 18.6*  MCHC 26.5* 28.4* 27.9*  RDW 21.6* 24.0* 24.6*  PLT 279 243 276    BNPNo results for input(s): BNP, PROBNP in the last 168 hours.   DDimer No results for input(s): DDIMER in the last 168 hours.   Radiology    DG Chest Portable 1 View  Result Date: 11/25/2019 CLINICAL DATA:  Fatigue. EXAM: PORTABLE CHEST 1 VIEW COMPARISON:  Prior chest radiographs 10/30/2019. FINDINGS: Please note portions of the lateral costophrenic angles are excluded from the field of view bilaterally. Heart size within normal limits. No appreciable airspace consolidation or pulmonary edema. No evidence of pleural effusion or pneumothorax. No acute bony abnormality identified. IMPRESSION:  Portions of the lateral costophrenic angles are excluded from the field of view bilaterally. No evidence of active cardiopulmonary disease. Electronically Signed   By: Jackey Loge DO   On: 11/25/2019 18:23    Cardiac Studies   None   Patient Profile        HARVEER SADLER is a 59 y.o. 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    Assessment & Plan    1  Anemia   Hgb 10.4 this am after transfusion yesterday   Plan for EGD/Colonoscopy today  Today to look for bleeding   2   Dyspnea / SOB   Pt had been seen by K Tobb  Plan was  for heart catheterization given known CAD   In preprocedure  work up found to be anemic   Cath Deferred   Will need to follow for symptoms after  Rx of anemia  3  Hx AS  Mod to severe with mean gradient 38 mm Hg   Will need to be followed   4  Carotid bruits  May reflect  murmur of AS  WOuld get USN of carotids at some pt   5  Lpids   Not on statin at home  Lpids show  For questions or updates, please contact [facility] Please consult [email or web address] for contact info under        Signed, Dietrich Pates, MD  11/27/2019, 6:57 AM

## 2019-11-27 NOTE — Op Note (Signed)
[facility] Patient Name: Vincent Guerrero Procedure Date : 11/27/2019 MRN: [id number] Attending MD: Starr Lake. Myrtie Neither , MD Date of Birth: 04-09-1960 CSN: [id number] Age: 59 Admit Type: Inpatient Procedure:                Colonoscopy Indications:              Heme positive stool, Iron deficiency anemia                            secondary to chronic blood loss Providers:                Sherilyn Cooter L. Myrtie Neither, MD, Bonney Leitz, Lawson Radar,                            Technician Referring MD:             [facility] Medicines:                Monitored Anesthesia Care Complications:            No immediate complications. Estimated Blood Loss:     Estimated blood loss: none. Procedure:                Pre-Anesthesia Assessment:                           - Prior to the procedure, a History and Physical                            was performed, and patient medications and                            allergies were reviewed. The patient's tolerance of                            previous anesthesia was also reviewed. The risks                            and benefits of the procedure and the sedation                            options and risks were discussed with the patient.                            All questions were answered, and informed consent                            was obtained. Prior Anticoagulants: The patient has                            taken no previous anticoagulant or antiplatelet                            agents. ASA Grade Assessment: IV - A patient with  severe systemic disease that is a constant threat                            to life. After reviewing the risks and benefits,                            the patient was deemed in satisfactory condition to                            undergo the procedure.                           After obtaining informed consent, the colonoscope                            was passed under  direct vision. Throughout the                            procedure, the patient's blood pressure, pulse, and                            oxygen saturations were monitored continuously. The                            [id number] ([id number] Olympus colonoscope was                            introduced through the anus and advanced to the the                            terminal ileum, with identification of the                            appendiceal orifice and IC valve. The colonoscopy                            was performed without difficulty. The patient                            tolerated the procedure well. The quality of the                            bowel preparation was good. The terminal ileum,                            ileocecal valve, appendiceal orifice, and rectum                            were photographed. The bowel preparation used was                            MoviPrep. Scope In: 1:44:04 PM Scope Out: 1:56:43 PM Scope Withdrawal Time: 0 hours 9 minutes 56 seconds  Total Procedure Duration: 0 hours 12 minutes 39  seconds  Findings:      The perianal and digital rectal examinations were normal.      The terminal ileum appeared normal.      The entire examined colon appeared normal on direct and retroflexion       views. Impression:               - The examined portion of the ileum was normal.                           - The entire examined colon is normal on direct and                            retroflexion views.                           - No specimens collected. Recommendation:           - Patient has a contact number available for                            emergencies. The signs and symptoms of potential                            delayed complications were discussed with the                            patient. Return to normal activities tomorrow.                            Written discharge instructions were provided to the                            patient.                            - Return patient to hospital ward for ongoing care.                           - Resume regular diet.                           - Repeat colonoscopy in 10 years for screening                            purposes.                           - See the other procedure note for documentation of                            additional recommendations. Procedure Code(s):        --- Professional ---                           [phone], Colonoscopy, flexible; diagnostic, including  collection of specimen(s) by brushing or washing,                            when performed (separate procedure) Diagnosis Code(s):        --- Professional ---                           R19.5, Other fecal abnormalities                           D50.0, Iron deficiency anemia secondary to blood                            loss (chronic) CPT copyright 2019 American Medical Association. All rights reserved. The codes documented in this report are preliminary and upon coder review may  be revised to meet current compliance requirements. Clarence Dunsmore L. Myrtie Neither, MD 11/27/2019 2:16:59 PM This report has been signed electronically. Number of Addenda: 0

## 2019-11-27 NOTE — Transfer of Care (Signed)
Immediate Anesthesia Transfer of Care Note  Patient: Vincent Guerrero  Procedure(s) Performed: COLONOSCOPY WITH PROPOFOL (N/A ) ESOPHAGOGASTRODUODENOSCOPY (EGD) WITH PROPOFOL (N/A )  Patient Location: Endoscopy Unit  Anesthesia Type:MAC  Level of Consciousness: awake, alert  and oriented  Airway & Oxygen Therapy: Patient Spontanous Breathing and Patient connected to face mask oxygen  Post-op Assessment: Report given to RN and Post -op Vital signs reviewed and stable  Post vital signs: Reviewed and stable  Last Vitals:  Vitals Value Taken Time  BP 97/56 11/27/19 1404  Temp    Pulse 56 11/27/19 1404  Resp 14 11/27/19 1405  SpO2 100 % 11/27/19 1404  Vitals shown include unvalidated device data.  Last Pain:  Vitals:   11/27/19 1233  TempSrc: Oral  PainSc: 0-No pain         Complications: No complications documented.

## 2019-11-28 DIAGNOSIS — I10 Essential (primary) hypertension: Secondary | ICD-10-CM

## 2019-11-28 DIAGNOSIS — I25811 Atherosclerosis of native coronary artery of transplanted heart without angina pectoris: Secondary | ICD-10-CM

## 2019-11-28 DIAGNOSIS — E7800 Pure hypercholesterolemia, unspecified: Secondary | ICD-10-CM

## 2019-11-28 LAB — CBC
HCT: 35.5 % — ABNORMAL LOW (ref 39.0–52.0)
Hemoglobin: 10.2 g/dL — ABNORMAL LOW (ref 13.0–17.0)
MCH: 19.1 pg — ABNORMAL LOW (ref 26.0–34.0)
MCHC: 28.7 g/dL — ABNORMAL LOW (ref 30.0–36.0)
MCV: 66.6 fL — ABNORMAL LOW (ref 80.0–100.0)
Platelets: 247 10*3/uL (ref 150–400)
RBC: 5.33 MIL/uL (ref 4.22–5.81)
RDW: 24.8 % — ABNORMAL HIGH (ref 11.5–15.5)
WBC: 12.6 10*3/uL — ABNORMAL HIGH (ref 4.0–10.5)
nRBC: 0 % (ref 0.0–0.2)

## 2019-11-28 LAB — GLUCOSE, CAPILLARY: Glucose-Capillary: 194 mg/dL — ABNORMAL HIGH (ref 70–99)

## 2019-11-28 MED ORDER — ISOSORBIDE MONONITRATE ER 60 MG PO TB24
60.0000 mg | ORAL_TABLET | Freq: Every day | ORAL | 0 refills | Status: DC
Start: 2019-11-29 — End: 2020-04-15

## 2019-11-28 MED ORDER — PANTOPRAZOLE SODIUM 40 MG PO TBEC: 40.0000 mg | DELAYED_RELEASE_TABLET | Freq: Two times a day (BID) | ORAL | 0 refills | Status: DC

## 2019-11-28 MED ORDER — ACETAMINOPHEN 325 MG PO TABS: 650.0000 mg | ORAL_TABLET | Freq: Four times a day (QID) | ORAL | 0 refills | Status: DC | PRN

## 2019-11-28 MED ORDER — INFLUENZA VAC SPLIT QUAD 0.5 ML IM SUSY
0.5000 mL | PREFILLED_SYRINGE | Freq: Once | INTRAMUSCULAR | Status: DC
  Filled 2019-11-28: qty 0.5

## 2019-11-28 MED ORDER — PNEUMOCOCCAL VAC POLYVALENT 25 MCG/0.5ML IJ INJ
0.5000 mL | INJECTION | Freq: Once | INTRAMUSCULAR | Status: AC
  Administered 2019-11-28: 0.5 mL via INTRAMUSCULAR
  Filled 2019-11-28: qty 0.5

## 2019-11-28 NOTE — Progress Notes (Signed)
Patient ambulated in hall to the end and back ~250 feet independently. Used home cane. Very steady on feet and needing no assistance from Nurse.

## 2019-11-28 NOTE — Progress Notes (Signed)
Progress Note  Patient Name: Vincent Guerrero Date of Encounter: 11/28/2019  [facility] Cardiologist: Vincent Ripple, DO   Subjective   Felling well.  He has not ambulated yet.  He only complains of some abdominal discomfort but no chest pain or shortness of breath while lying in bed.  Inpatient Medications    Scheduled Meds: . sodium chloride   Intravenous Once  . insulin aspart  0-9 Units Subcutaneous TID WC  . isosorbide mononitrate  60 mg Oral Daily  . lidocaine  2 patch Transdermal Q24H  . lisinopril  20 mg Oral BID  . metoprolol tartrate  50 mg Oral BID  . Netarsudil-Latanoprost  1 drop Right Eye QHS  . nicotine  21 mg Transdermal Daily  . pantoprazole  40 mg Oral BID   Continuous Infusions: . lactated ringers 20 mL/hr at 11/27/19 1319   PRN Meds: acetaminophen **OR** acetaminophen   Vital Signs    Vitals:   11/27/19 1424 11/27/19 1449 11/27/19 2032 11/28/19 0419  BP: 118/68 (!) 141/80 132/67 (!) 157/75  Pulse: 64 (!) 58 62 75  Resp: 11  18 18   Temp:  (!) 97.4 F (36.3 C) 98.7 F (37.1 C) 99.6 F (37.6 C)  TempSrc:  Oral Oral Oral  SpO2: 99% 100% 100% 100%  Weight:    90.4 kg  Height:        Intake/Output Summary (Last 24 hours) at 11/28/2019 0813 Last data filed at 11/27/2019 2100 Gross per 24 hour  Intake 640 ml  Output --  Net 640 ml   Last 3 Weights 11/28/2019 11/27/2019 11/27/2019  Weight (lbs) 199 lb 4.8 oz 200 lb 200 lb 14.4 oz  Weight (kg) 90.402 kg 90.719 kg 91.128 kg      Telemetry    Sinus rhythm.  No events.- Personally Reviewed  ECG    N/A- Personally Reviewed  Physical Exam   VS:  BP (!) 157/75 (BP Location: Left Arm)   Pulse 75   Temp 99.6 F (37.6 C) (Oral)   Resp 18   Ht 5\' 10"  (1.778 m)   Wt 90.4 kg   SpO2 100%   BMI 28.60 kg/m  , BMI Body mass index is 28.6 kg/m. GENERAL:  Well appearing HEENT: Pupils equal round and reactive, fundi not visualized, oral mucosa unremarkable NECK:  No jugular venous  distention, waveform within normal limits, carotid upstroke brisk and symmetric, no bruits, no thyromegaly LYMPHATICS:  No cervical adenopathy LUNGS:  Clear to auscultation bilaterally HEART:  RRR.  PMI not displaced or sustained,S1 and S2 within normal limits, no S3, no S4, no clicks, no rubs, III/VI systolic murmur at the LUSB ABD:  Flat, positive bowel sounds normal in frequency in pitch, no bruits, no rebound, no guarding, no midline pulsatile mass, no hepatomegaly, no splenomegaly EXT:  2 plus pulses throughout, no edema, no cyanosis no clubbing SKIN:  No rashes no nodules NEURO:  Cranial nerves II through XII grossly intact, motor grossly intact throughout PSYCH:  Cognitively intact, oriented to person place and time   Labs    High Sensitivity Troponin:   Recent Labs  Lab 11/25/19 2044  TROPONINIHS 7      Chemistry Recent Labs  Lab 11/24/19 1557 11/25/19 1554 11/26/19 0523  NA 140 138  --   K 4.5 4.2  --   CL 104 105  --   CO2 22 24  --   GLUCOSE 238* 200*  --   BUN 16 16  --  CREATININE 0.99 1.19 0.97  CALCIUM 9.0 8.9  --   GFRNONAA 83 >60 >60  GFRAA 96  --   --   ANIONGAP  --  9  --      Hematology Recent Labs  Lab 11/26/19 0430 11/27/19 0226 11/28/19 0641  WBC 9.6 10.9* 12.6*  RBC 4.85 5.58 5.33  HGB 9.2* 10.4* 10.2*  HCT 32.4* 37.3* 35.5*  MCV 66.8* 66.8* 66.6*  MCH 19.0* 18.6* 19.1*  MCHC 28.4* 27.9* 28.7*  RDW 24.0* 24.6* 24.8*  PLT 243 276 247    BNPNo results for input(s): BNP, PROBNP in the last 168 hours.   DDimer No results for input(s): DDIMER in the last 168 hours.   Radiology    No results found.  Cardiac Studies   Echo 10/14/19:  1. Left ventricular ejection fraction, by estimation, is 60 to 65%. The  left ventricle has normal function. The left ventricle has no regional  wall motion abnormalities. There is moderate left ventricular hypertrophy.  Left ventricular diastolic  parameters are consistent with Grade I diastolic  dysfunction (impaired  relaxation).  2. Right ventricular systolic function is normal. The right ventricular  size is normal. There is normal pulmonary artery systolic pressure.  3. The mitral valve is normal in structure. No evidence of mitral valve  regurgitation. No evidence of mitral stenosis.  4. The aortic valve is normal in structure. Aortic valve regurgitation is  moderate. Moderate aortic valve stenosis.  5. The inferior vena cava is normal in size with greater than 50%  respiratory variability, suggesting right atrial pressure of 3 mmHg.   Patient Profile     59 y.o. male with CAD s/p DES to RCA, mod-severe AS, diabetes, hypertension, hyperlipidemia, tobacco abuse and glaucoma with shortness of breath planning for LHC.  However, he was found to have symptomatic anemia requiring transfusion.  Assessment & Plan    # CAD s/p RCA PCI: # Hyperlipidemia: Patient presented with exertional dyspnea initially thought to be ischemic.  However precath work-up revealed significant anemia requiring transfusion.  He did undergo transfusion and clipping of gastric ulcers.  We will wait to see how he feels today regarding walking and exerting himself.  As long as his symptoms have resolved no plan for further ischemic evaluation at this time.  Will reevaluate as an outpatient.  High-sensitivity troponin has been within normal range.  Statin intolerant.  LDL 33 on Repatha.  Resume aspirin when safe per GI.  Continue Imdur and metoprolol.  # Symptomatic anemia: Vincent Guerrero was found to have 2 gastric ulcers which were clipped and treated.  It is quite likely that his anemia was contributing to his exertional dyspnea.  We will reevaluate the need for cardiac catheterization as an outpatient if he continues to have exertional dyspnea with stable hemoglobin.  Hemoglobin is now stable at 10.2 up from a nadir of 7.8.  # Moderate to severe aortic stenosis:   Mean aortic valve gradient was 38 mmHg on  10/2019.  Will require close outpatient follow-up.  No evidence of heart failure on exam.  # Essential hypertension: BP is mostly high.  There is one recorded blood pressure 74/45 which presumably occurred when he was in endoscopy.  He was on 7 L of oxygen at the time.  Most of the blood pressures have been above goal outside of the perioperative period.  Continue home Imdur, lisinopril, and metoprolol.  If blood pressure continues to run high would consider switching metoprolol to carvedilol.  Dispo:  If he has no shortness of breath when ambulating then he is stable to be discharge from a cardiology standpoint.  We will ensure that he has follow-up.     For questions or updates, please contact [facility] Please consult [email or web address] for contact info under        Signed, Chilton Si, MD  11/28/2019, 8:13 AM

## 2019-11-28 NOTE — Progress Notes (Signed)
HD#2 Subjective:  Overnight Events: None  Vincent Guerrero was seen and evaluated at bedside this AM. States that he is doing well today. Complaining of some abdominal discomfort this AM. He was asking about possible cath today. We discussed that it appears that he would follow with cards in the outpatient setting, in order for his ulcers to heal. Patient voiced understanding.  All questions and concerns were addressed at bedside.   Objective:  Vital signs in last 24 hours: Vitals:   11/27/19 1424 11/27/19 1449 11/27/19 2032 11/28/19 0419  BP: 118/68 (!) 141/80 132/67 (!) 157/75  Pulse: 64 (!) 58 62 75  Resp: 11  18 18   Temp:  (!) 97.4 F (36.3 C) 98.7 F (37.1 C) 99.6 F (37.6 C)  TempSrc:  Oral Oral Oral  SpO2: 99% 100% 100% 100%  Weight:    90.4 kg  Height:       Supplemental O2:RA SpO2: 100 % O2 Flow Rate (L/min): 7 L/min   Physical Exam:  Physical Exam Constitutional:      General: He is not in acute distress.    Appearance: Normal appearance. He is not ill-appearing.  Cardiovascular:     Rate and Rhythm: Normal rate and regular rhythm.     Pulses: Normal pulses.     Heart sounds: Normal heart sounds. No murmur heard.  No friction rub. No gallop.   Pulmonary:     Effort: Pulmonary effort is normal.     Breath sounds: Normal breath sounds. No wheezing, rhonchi or rales.  Abdominal:     General: Abdomen is flat. Bowel sounds are normal.     Palpations: Abdomen is soft.     Tenderness: There is no abdominal tenderness. There is no guarding.  Neurological:     Mental Status: He is alert.     Filed Weights   11/27/19 0412 11/27/19 1233 11/28/19 0419  Weight: 91.1 kg 90.7 kg 90.4 kg     Intake/Output Summary (Last 24 hours) at 11/28/2019 0600 Last data filed at 11/27/2019 2100 Gross per 24 hour  Intake 640 ml  Output --  Net 640 ml   Net IO Since Admission: 640 mL [11/28/19 0600]  Pertinent Labs: CBC Latest Ref Rng & Units 11/27/2019 11/26/2019  11/25/2019  WBC 4.0 - 10.5 K/uL 10.9(H) 9.6 9.7  Hemoglobin 13.0 - 17.0 g/dL 10.4(L) 9.2(L) 7.8(L)  Hematocrit 39 - 52 % 37.3(L) 32.4(L) 29.4(L)  Platelets 150 - 400 K/uL 276 243 279    CMP Latest Ref Rng & Units 11/26/2019 11/25/2019 11/24/2019  Glucose 70 - 99 mg/dL - 11/26/2019) 696(E)  BUN 6 - 20 mg/dL - 16 16  Creatinine 952(W - 1.24 mg/dL 4.13 2.44 0.10  Sodium 135 - 145 mmol/L - 138 140  Potassium 3.5 - 5.1 mmol/L - 4.2 4.5  Chloride 98 - 111 mmol/L - 105 104  CO2 22 - 32 mmol/L - 24 22  Calcium 8.9 - 10.3 mg/dL - 8.9 9.0    Imaging: No results found.  Assessment/Plan:   Principal Problem:   Symptomatic anemia Active Problems:   Coronary artery disease involving native artery of transplanted heart without angina pectoris   Hypertension   Diabetes mellitus (HCC)   Iron deficiency anemia due to chronic blood loss  Patient Summary: Vincent Guerrero is a 59 y.o. year old male with hx of  CAD s/p RCA stent, MI in 2010, moderate aortic stenosis, CVA, HTN, DM, HLD presenting for DOE, anemia, weight loss, and fatigue  for the past year, worsening recently. Was sent here from his cardiologist for worsening anemia. Received 2 units while in ED. Cardiac clearance yesterday, awaiting GI recommendations and possible procedure.    Symptomatic Iron deficiency anemia secondary to gastric ulcers  Hgb 12.6. Hx of heavy BC Powder use. DDx includes colon cancer vs gastric ulcer. Continues to deny abdominal pain. Colonoscopy was negative for etiologies. EGD showed two superficial, nonbleeding gastric ulcers located in the gastric antrum and pre-pyloric region. -protonix 40mg  BID -daily CBC -f/u with PCP for alternative back pain regimen   Chest pain with EKG changes CAD s/p MI and RCA stent in 2010 Moderate aortic stenosis Has good cardiology follow up. L heart cath in 2018 with total occlusion of RCA which was previously stented, 30% stenosis of L main, 40% stenosis of proximal LAD.  Cardiology planning for repeat cath due to increasing SOB and CP, but postponed due to anemia. Echo on 10/19/19 with LVEF 60-65%, grade 1 diastolic dysfunction, moderate aortic stenosis. No active chest pain. EKG with new t-wave inversion in lead II -cards planning for outpatient left heart catheterization procedure  Diet: NPO IVF: None,None VTE: SCDs Code: Full PT/OT recs: None, none.  Dispo: Anticipated discharge to Home today pending cardiology clearance.    10/21/19, MD IMTS, PGY-2 Pager: [phone] 11/28/2019,10:07 AM Please contact the on call pager after 5 pm and on weekends at [phone].

## 2019-11-28 NOTE — Progress Notes (Signed)
Patient given discharge instructions. New medications discussed, smoking cessation information given and follow up instructions highlighted in printed AVS. Patients IVs removed and telemetry discontinued. Son at bedside to take patient home. All questions answered.

## 2019-11-29 NOTE — Discharge Summary (Signed)
Name: Vincent Guerrero MRN: [id number] DOB: 03-Nov-1960 59 y.o. PCP: Simone Curia, MD  Date of Admission: 11/25/2019  3:49 PM Date of Discharge: 11/28/2019 Attending Physician: Dr. Cleda Daub  Discharge Diagnosis: Principal Problem:   Symptomatic anemia Active Problems:   Coronary artery disease involving native artery of transplanted heart without angina pectoris   Hypertension   Diabetes mellitus (HCC)   Iron deficiency anemia due to chronic blood loss   Discharge Medications: Allergies as of 11/28/2019   No Known Allergies     Medication List    STOP taking these medications   BC HEADACHE POWDER PO   brimonidine 0.2 % ophthalmic solution Commonly known as: ALPHAGAN   Difluprednate 0.05 % Emul   potassium chloride SA 20 MEQ tablet Commonly known as: Klor-Con M20     TAKE these medications   acetaminophen 325 MG tablet Commonly known as: TYLENOL Take 2 tablets (650 mg total) by mouth every 6 (six) hours as needed for mild pain (or Fever >/= 101).   buPROPion 150 MG 12 hr tablet Commonly known as: Zyban Start with 1 tablet daily for 3 days, then increase to 1 tablet twice daily.  Start 1 week before your quit date.   Centrum Silver 50+Men Tabs Take 1 tablet by mouth daily with breakfast.   glipiZIDE 10 MG tablet Commonly known as: GLUCOTROL Take 10 mg by mouth 2 (two) times daily before a meal.   isosorbide mononitrate 60 MG 24 hr tablet Commonly known as: IMDUR Take 1 tablet (60 mg total) by mouth daily. What changed:   medication strength  Another medication with the same name was removed. Continue taking this medication, and follow the directions you see here.   lisinopril 20 MG tablet Commonly known as: ZESTRIL Take 1 tablet (20 mg total) by mouth 2 (two) times daily.   metFORMIN 1000 MG tablet Commonly known as: GLUCOPHAGE Take 1,000 mg by mouth 2 (two) times daily with a meal.   metoprolol tartrate 50 MG tablet Commonly known as:  LOPRESSOR Take 1 tablet (50 mg total) by mouth 2 (two) times daily.   pantoprazole 40 MG tablet Commonly known as: Protonix Take 1 tablet (40 mg total) by mouth 2 (two) times daily.   POTASSIUM PO Take 1 tablet by mouth 3 (three) times a week.   Repatha Pushtronex System 420 MG/3.5ML Soct Generic drug: Evolocumab with Infusor Inject 420 mg into the skin every 30 (thirty) days.   Rocklatan 0.02-0.005 % Soln Generic drug: Netarsudil-Latanoprost Place 1 drop into the right eye at bedtime.      Disposition and follow-up:   Mr.Vincent Guerrero was discharged from [facility] in Good condition.  At the hospital follow up visit please address:  1.  Follow-up:  A. Symptomatic Iron Deficiency Anemia    B. Chest Pain with EKG Changes/CAD s/p MI and RCA stent in 2010,                     Moderate Aortic Stenosis  2.  Labs / imaging needed at time of follow-up: CBC  3.  Pending labs/ test needing follow-up: None  4.  Medication Changes  Started: Protonix 40 mg BID  Stopped: Goodie Powders  Follow-up Appointments:  Follow-up Information    Tobb, Kardie, DO Follow up on 12/22/2019.   Specialty: Cardiology Why: Cardiology Hospital Follow-up on 12/22/2019 at 2:20 PM. Please call the office if needing a different date or time.  Contact information: [address] [place] [address] [phone]        Simone Curia, MD. Schedule an appointment as soon as possible for a visit in 1 week(s).   Specialty: Internal Medicine Contact information: [address] [place] [address] [phone]              Hospital Course by problem list:  1.) Symptomatic Iron Deficiency Anemia Patient sent to ED after having found to be anemic (hgb 7.8) during pre-cardiac catheterization work-up. The patient had been having dyspnea on exertion for the past year. He also initially reported generalized weakness, weight loss, night sweats, and muscle cramping.  Patient endorsed goodie powder use 6-8 per day due to chronic pain, but recently stopped due to abdominal pain. Patient was transfused with 2 units of packed red blood cells. GI was consulted, endoscopy and colonoscopy were performed. Endoscopy revealed two superficial, non-bleeding gastric ulcers found in the gastric antrum and pre-pyloric region. Started on protonix 40 mg BID. Hgb recovered well, stable at discharge. Patient instructed to stop goodie powders and follow up with PCP for alternative back pain regimen and repeat CBC.   2.) Chest Pain with EKG Changes/CAD s/p MI and RCA stent in 2010,                       Moderate Aortic Stenosis Patient with cardiac history and initially thought patient's dyspnea on exertion was cardiac in nature. Once it was found patient was anemic, suspected symptomatic anemia as etiology. Patient to be reevaluated by cardiology on outpatient basis. He was continued on his imdur and metoprolol.   Discharge Vitals:   BP 138/70   Pulse 75   Temp 99.6 F (37.6 C) (Oral)   Resp 18   Ht 5\' 10"  (1.778 m)   Wt 90.4 kg   SpO2 100%   BMI 28.60 kg/m   Pertinent Labs, Studies, and Procedures:  CBC Latest Ref Rng & Units 11/28/2019 11/27/2019 11/26/2019  WBC 4.0 - 10.5 K/uL 12.6(H) 10.9(H) 9.6  Hemoglobin 13.0 - 17.0 g/dL 10.2(L) 10.4(L) 9.2(L)  Hematocrit 39 - 52 % 35.5(L) 37.3(L) 32.4(L)  Platelets 150 - 400 K/uL 247 276 243    CMP Latest Ref Rng & Units 11/26/2019 11/25/2019 11/24/2019  Glucose 70 - 99 mg/dL - 11/26/2019) 505(L)  BUN 6 - 20 mg/dL - 16 16  Creatinine 976(B - 1.24 mg/dL 3.41 9.37 9.02  Sodium 135 - 145 mmol/L - 138 140  Potassium 3.5 - 5.1 mmol/L - 4.2 4.5  Chloride 98 - 111 mmol/L - 105 104  CO2 22 - 32 mmol/L - 24 22  Calcium 8.9 - 10.3 mg/dL - 8.9 9.0    DG Chest Portable 1 View  Result Date: 11/25/2019 CLINICAL DATA:  Fatigue. EXAM: PORTABLE CHEST 1 VIEW COMPARISON:  Prior chest radiographs 10/30/2019. FINDINGS: Please note portions of  the lateral costophrenic angles are excluded from the field of view bilaterally. Heart size within normal limits. No appreciable airspace consolidation or pulmonary edema. No evidence of pleural effusion or pneumothorax. No acute bony abnormality identified. IMPRESSION: Portions of the lateral costophrenic angles are excluded from the field of view bilaterally. No evidence of active cardiopulmonary disease. Electronically Signed   By: 11/01/2019 DO   On: 11/25/2019 18:23    COLONOSCOPY (11/27/2019): The perianal and digital rectal examinations were normal. Findings: The terminal ileum appeared normal. The entire examined colon appeared normal on direct and retroflexion views. - The examined  portion of the ileum was normal. - The entire examined colon is normal on direct and retroflexion views. - No specimens collected.  EGD (11/27/2019): The esophagus was normal. Findings: Two non-bleeding superficial gastric ulcers were found in the gastric antrum and pre-pyloric area. The largest lesion was 8 mm in largest dimension. The antral ulcer had a friable edge. For hemostasis, two hemostatic clips were successful and one hemostatic clip was unsuccessfully placed (MR conditional). There was no bleeding at the end of the procedure. Biopsies were taken from the antrum and gastric body with a cold forceps for histology to rule out H pylori. The cardia and gastric fundus were normal on retroflexion. The examined duodenum was normal.   Discharge Instructions: Discharge Instructions    Call MD for:  difficulty breathing, headache or visual disturbances   Complete by: As directed    Call MD for:  extreme fatigue   Complete by: As directed    Call MD for:  persistant dizziness or light-headedness   Complete by: As directed    Call MD for:  persistant nausea and vomiting   Complete by: As directed    Call MD for:  severe uncontrolled pain   Complete by: As directed    Diet - low sodium heart  healthy   Complete by: As directed    Increase activity slowly   Complete by: As directed       Signed: Belva Agee, MD 11/29/2019, 5:58 AM   Pager: [phone]

## 2019-11-30 ENCOUNTER — Encounter (HOSPITAL_COMMUNITY): Payer: Self-pay | Admitting: Gastroenterology

## 2019-11-30 ENCOUNTER — Other Ambulatory Visit: Payer: Self-pay | Admitting: Physician Assistant

## 2019-11-30 LAB — SURGICAL PATHOLOGY

## 2019-12-02 ENCOUNTER — Encounter: Payer: Self-pay | Admitting: Gastroenterology

## 2019-12-21 DIAGNOSIS — I25811 Atherosclerosis of native coronary artery of transplanted heart without angina pectoris: Secondary | ICD-10-CM | POA: Insufficient documentation

## 2019-12-22 ENCOUNTER — Ambulatory Visit: Payer: BLUE CROSS/BLUE SHIELD | Admitting: Cardiology

## 2020-03-01 ENCOUNTER — Ambulatory Visit: Payer: BLUE CROSS/BLUE SHIELD | Admitting: Cardiology

## 2020-04-13 NOTE — ED Provider Notes (Incomplete)
Behavioral Health Admission H&P [facility] & OBS)  Date: 04/13/20 Patient Name: Vincent Guerrero MRN: [id number] Chief Complaint:  Chief Complaint  Patient presents with  . IVC    Hallucinations      Diagnoses:  Final diagnoses:  Psychosis, unspecified psychosis type (HCC)  Hallucination    HPI: Vincent Guerrero is a 60y/o male. Patient presented to [facility] under IVC paper work petitioned by her daughter Marthenia Rolling. Patient presented with chief complain of auditory and visual hallucination. Patient reports a history of intermittent auditory and visual hallucination that has gotten worse and more frequent over the past 1 year. Patient admits to auditory hallucination of "voices telling me to stand up for a long time, sit down, don't leave the room, don't eat, don't drink, don't use the restroom, and go knock out the window." she report visual hallucination of "seeing cars trying to tell me something. Seeing people and animals moving like something is controlling them." she reports that "the voices got really loud today and told me to break the window in the room I was staying at." She admits that she was previously admitted inpatient at [facility] ([facility]) last year for hallucination. She report that she was taking psychotropic drugs but discontinued them by her self due to headaches. She can't remember the names of the medications she was prescribed. This writer was unable to locate any psychiatric records for patient in epic (care everywhere)  Patient denies suicidal and homicidal ideations; she endorses auditory and visual hallucination and she reports that she is paranoid. She denies delusions. She lives at home with daughter and daughter's boyfriend. She is unemployed and on disability. Patient report that she is 3 months pregnant (pregnancy was confirmed with a positive urine pregnancy test). She reports that she has not seek any medical care for the pregnancy. she admits to  using marijuana, she denies alcohol and other illicit drug use.   This Clinical research associate contacted patient's daughter, Marthenia Rolling, IVC petitioner at [phone] and  spoke together with both Marthenia Rolling and her sister Bayard Beaver (pt's daughter). They both report that patient has a history of depression, anxiety, bipolar, and schizophrenia. They report that patient was last treated [facility] in 2021, although this writer was unable to find records in chart review (care everywhere). They report patient has been "isolating, paranoid about people plotting against her, saying voices are telling her to do things, and that videos on youtube are sending messages and attacks against her."  They report that patient has not been eating, sleeping, and stopped taking her medications. They report that patient "brook windows in the house today because of the voices" and they decided to "IVC her so can get some help." They both believe that patient is a "danger to herself and others in the home because she's paranoid and listening to the voices in her head."    PHQ 2-9:   Flowsheet Row ED from 03/14/2020 in [facility] RISK CATEGORY No Risk       Total Time spent with patient: 30 minutes  Musculoskeletal  Strength & Muscle Tone: within normal limits Gait & Station: normal Patient leans: Right  Psychiatric Specialty Exam  Presentation General Appearance: Appropriate for Environment  Eye Contact:Good  Speech:Clear and Coherent  Speech Volume:Normal  Handedness:Right   Mood and Affect  Mood:Anxious; Depressed  Affect:Congruent   Thought Process  Thought Processes:Coherent  Descriptions of Associations:Intact  Orientation:Full (Time, Place and Person)  Thought Content:WDL   Hallucinations:Hallucinations: Auditory; Visual Description of Auditory Hallucinations: "voices telling me to stand up, sit, don't leave the room, don't  eat, don't use the restroom, go know the windowns out" Description of Visual Hallucinations: "I see cars trying to tell me things. i also see people and animals moving like something is controling them."  Ideas of Reference:Paranoia  Suicidal Thoughts:Suicidal Thoughts: No  Homicidal Thoughts:Homicidal Thoughts: No   Sensorium  Memory:Immediate Good; Recent Fair; Remote Fair  Judgment:Good  Insight:Fair   Executive Functions  Concentration:Good  Attention Span:Good  Recall:Good  Fund of Knowledge:No data recorded Language:Good   Psychomotor Activity  Psychomotor Activity:Psychomotor Activity: Normal   Assets  Assets:Desire for Improvement; Agricultural engineer; Housing   Sleep  Sleep:Sleep: Poor Number of Hours of Sleep: 4   Nutritional Assessment (For OBS and [facility] admissions only) Has the patient had a weight loss or gain of 10 pounds or more in the last 3 months?: No Has the patient had a decrease in food intake/or appetite?: No Does the patient have dental problems?: No Does the patient have eating habits or behaviors that may be indicators of an eating disorder including binging or inducing vomiting?: No Has the patient recently lost weight without trying?: No Has the patient been eating poorly because of a decreased appetite?: No Malnutrition Screening Tool Score: 0    Physical Exam Vitals reviewed.  Constitutional:      General: She is not in acute distress.    Appearance: She is obese. She is not ill-appearing.  HENT:     Head: Normocephalic and atraumatic.  Eyes:     General:        Right eye: No discharge.        Left eye: No discharge.  Cardiovascular:     Rate and Rhythm: Normal rate.     Pulses: Normal pulses.  Pulmonary:     Effort: No respiratory distress.  Musculoskeletal:     Cervical back: Normal range of motion.  Skin:    Coloration: Skin is not jaundiced.  Neurological:     Mental Status: She is alert and oriented  to person, place, and time.  Psychiatric:        Attention and Perception: She perceives auditory and visual hallucinations.        Mood and Affect: Mood is anxious and depressed.        Speech: Speech normal.        Behavior: Behavior normal. Behavior is cooperative.        Thought Content: Thought content is paranoid. Thought content is not delusional. Thought content does not include homicidal or suicidal ideation. Thought content does not include homicidal or suicidal plan.        Cognition and Memory: Cognition normal.        Judgment: Judgment normal.    Review of Systems  Constitutional: Negative for chills, fever and weight loss.  HENT: Negative.   Eyes: Negative.  Negative for pain, discharge and redness.  Respiratory: Negative for cough, hemoptysis, sputum production, shortness of breath and wheezing.   Cardiovascular: Negative for chest pain and palpitations.  Gastrointestinal: Negative for abdominal pain and vomiting.  Genitourinary: Negative.   Musculoskeletal: Negative.   Skin: Negative for itching.  Neurological: Negative.   Endo/Heme/Allergies: Negative.   Psychiatric/Behavioral: Positive for depression and hallucinations. Negative for substance abuse and suicidal ideas. The patient is nervous/anxious and has insomnia.     Blood pressure 136/85,  pulse 81, temperature 98.7 F (37.1 C), temperature source Oral, resp. rate 20, SpO2 98 %. There is no height or weight on file to calculate BMI.  Past Psychiatric History:    Is the patient at risk to self? No  Has the patient been a risk to self in the past 6 months? No .    Has the patient been a risk to self within the distant past? Yes   Is the patient a risk to others? Yes   Has the patient been a risk to others in the past 6 months? Yes   Has the patient been a risk to others within the distant past? Yes   Past Medical History:  Past Medical History:  Diagnosis Date  . Bipolar disorder (HCC)   . Depression   .  Schizophrenia [facility](HCC)     Past Surgical History:  Procedure Laterality Date  . CHOLECYSTECTOMY    . TONSILLECTOMY      Family History: History reviewed. No pertinent family history.  Social History:  Social History   Socioeconomic History  . Marital status: Single    Spouse name: Not on file  . Number of children: Not on file  . Years of education: Not on file  . Highest education level: Not on file  Occupational History  . Not on file  Tobacco Use  . Smoking status: Current Every Day Smoker  . Smokeless tobacco: Never Used  Vaping Use  . Vaping Use: Never used  Substance and Sexual Activity  . Alcohol use: Yes    Comment: occasionally  . Drug use: Never  . Sexual activity: Not on file  Other Topics Concern  . Not on file  Social History Narrative  . Not on file   Social Determinants of Health   Financial Resource Strain: Not on file  Food Insecurity: Not on file  Transportation Needs: Not on file  Physical Activity: Not on file  Stress: Not on file  Social Connections: Not on file  Intimate Partner Violence: Not on file    SDOH:  SDOH Screenings   Alcohol Screen: Not on file  Depression (ZOX0-9(PHQ2-9): Not on file  Financial Resource Strain: Not on file  Food Insecurity: Not on file  Housing: Not on file  Physical Activity: Not on file  Social Connections: Not on file  Stress: Not on file  Tobacco Use: High Risk  . Smoking Tobacco Use: Current Every Day Smoker  . Smokeless Tobacco Use: Never Used  Transportation Needs: Not on file    Last Labs:  No visits with results within 6 Month(s) from this visit.  Latest known visit with results is:  Admission on 06/11/2019, Discharged on 06/11/2019  Component Date Value Ref Range Status  . Preg Test, Ur 06/11/2019 NEGATIVE  NEGATIVE Final   Comment:        THE SENSITIVITY OF THIS METHODOLOGY IS >24 mIU/mL   . Glucose, UA 06/11/2019 NEGATIVE  NEGATIVE mg/dL Final  . Bilirubin Urine 06/11/2019 NEGATIVE   NEGATIVE Final  . Ketones, ur 06/11/2019 NEGATIVE  NEGATIVE mg/dL Final  . Specific Gravity, Urine 06/11/2019 >=1.030  1.005 - 1.030 Final  . Hgb urine dipstick 06/11/2019 NEGATIVE  NEGATIVE Final  . pH 06/11/2019 5.5  5.0 - 8.0 Final  . Protein, ur 06/11/2019 NEGATIVE  NEGATIVE mg/dL Final  . Urobilinogen, UA 06/11/2019 0.2  0.0 - 1.0 mg/dL Final  . Nitrite 60/45/409805/07/2019 NEGATIVE  NEGATIVE Final  . Glori LuisLeukocytes,Ua 06/11/2019 NEGATIVE  NEGATIVE Final  Biochemical Testing Only. Please order routine urinalysis from main lab if confirmatory testing is needed.    Allergies: Patient has no known allergies.  PTA Medications: (Not in a hospital admission)   Medical Decision Making  Admit to [facility] for continuous assessment and stabilization  Contain labs      Recommendations  [facility] MSE Recommendations:[id number]}  Maricela Bo, NP 04/13/20  10:13 PM

## 2020-04-15 ENCOUNTER — Encounter: Payer: Self-pay | Admitting: Cardiology

## 2020-04-15 ENCOUNTER — Other Ambulatory Visit: Payer: Self-pay

## 2020-04-15 ENCOUNTER — Ambulatory Visit (INDEPENDENT_AMBULATORY_CARE_PROVIDER_SITE_OTHER): Payer: Medicaid Other | Admitting: Cardiology

## 2020-04-15 VITALS — BP 154/80 | HR 76 | Ht 70.0 in | Wt 210.2 lb

## 2020-04-15 DIAGNOSIS — E0800 Diabetes mellitus due to underlying condition with hyperosmolarity without nonketotic hyperglycemic-hyperosmolar coma (NKHHC): Secondary | ICD-10-CM

## 2020-04-15 DIAGNOSIS — E785 Hyperlipidemia, unspecified: Secondary | ICD-10-CM

## 2020-04-15 DIAGNOSIS — D500 Iron deficiency anemia secondary to blood loss (chronic): Secondary | ICD-10-CM

## 2020-04-15 DIAGNOSIS — R0602 Shortness of breath: Secondary | ICD-10-CM | POA: Diagnosis not present

## 2020-04-15 DIAGNOSIS — Z720 Tobacco use: Secondary | ICD-10-CM

## 2020-04-15 DIAGNOSIS — I2510 Atherosclerotic heart disease of native coronary artery without angina pectoris: Secondary | ICD-10-CM

## 2020-04-15 MED ORDER — RANOLAZINE ER 500 MG PO TB12: 500.0000 mg | ORAL_TABLET | Freq: Two times a day (BID) | ORAL | 12 refills | Status: DC

## 2020-04-15 NOTE — Progress Notes (Signed)
Cardiology Office Note:    Date:  04/15/2020   ID:  Vincent Guerrero, DOB 01/29/1961, MRN [id number]  PCP:  Simone Curia, MD  Cardiologist:  Thomasene Ripple, DO  Electrophysiologist:  None   Referring MD: Simone Curia, MD  I am feeling a lot better but I have some chest pain sometimes  History of Present Illness:    NATHANIE Guerrero is a 60 y.o. male with  a hx of coronary artery disease status post stentin the RCAa result of an MI in 2010,hyperlipidemia (not on statins due to statin intolerance), diabetes mellitus (on Metformin and glipizide),, current smokerCVA in 2011, moderate aortic stenosis in April 2018.   At his last visit the patient was short of breath were concerned that this may have been coronary artery disease therefore I set the patient up for a left heart catheterization but unfortunately his hemoglobin was significantly low.  In the interim he is following with GI he has had a colonoscopy he tells me he is feeling a lot better except that sometimes he had small intermittent chest discomfort.  He stopped his Repatha as well as his Imdur.  He felt both of these medication was playing with his mind.  Past Medical History:  Diagnosis Date  . Age-related nuclear cataract of both eyes 11/27/2018  . Anemia   . Bilateral recurrent inguinal hernia without obstruction or gangrene 05/09/2016   Added automatically from request for surgery [id number]  . Chest pressure 11/06/2019  . Coronary artery disease involving native artery of transplanted heart without angina pectoris   . Cortical age-related cataract of both eyes 11/27/2018  . Depression   . Diabetes mellitus (HCC)   . Hyperlipidemia LDL goal <70   . Hypertension   . Inguinal hernia without obstruction or gangrene    BILATERAL  . Primary open angle glaucoma (POAG) of both eyes, severe stage 11/27/2018  . Primary open angle glaucoma (POAG) of left eye, severe stage 11/27/2018  . Tobacco abuse 11/05/2019  . Tobacco use  11/06/2019    Past Surgical History:  Procedure Laterality Date  . BIOPSY  11/27/2019   Procedure: BIOPSY;  Surgeon: Sherrilyn Rist, MD;  Location: [facility] ENDOSCOPY;  Service: Gastroenterology;;  . COLONOSCOPY WITH PROPOFOL N/A 11/27/2019   Procedure: COLONOSCOPY WITH PROPOFOL;  Surgeon: Sherrilyn Rist, MD;  Location: [facility] ENDOSCOPY;  Service: Gastroenterology;  Laterality: N/A;  . ESOPHAGOGASTRODUODENOSCOPY (EGD) WITH PROPOFOL N/A 11/27/2019   Procedure: ESOPHAGOGASTRODUODENOSCOPY (EGD) WITH PROPOFOL;  Surgeon: Sherrilyn Rist, MD;  Location: [facility] ENDOSCOPY;  Service: Gastroenterology;  Laterality: N/A;  . HEMOSTASIS CLIP PLACEMENT  11/27/2019   Procedure: HEMOSTASIS CLIP PLACEMENT;  Surgeon: Sherrilyn Rist, MD;  Location: [facility] ENDOSCOPY;  Service: Gastroenterology;;  . HERNIA REPAIR  1998  . LEFT HEART CATH AND CORONARY ANGIOGRAPHY N/A 06/04/2016   Procedure: Left Heart Cath and Coronary Angiography;  Surgeon: Lyn Records, MD;  Location: [facility] INVASIVE CV LAB;  Service: Cardiovascular;  Laterality: N/A;    Current Medications: Current Meds  Medication Sig  . acetaminophen (TYLENOL) 325 MG tablet Take 2 tablets (650 mg total) by mouth every 6 (six) hours as needed for mild pain (or Fever >/= 101).  Marland Kitchen glipiZIDE (GLUCOTROL) 10 MG tablet Take 10 mg by mouth 2 (two) times daily before a meal.   . lisinopril (PRINIVIL,ZESTRIL) 20 MG tablet Take 1 tablet (20 mg total) by mouth 2 (two) times daily.  . metFORMIN (GLUCOPHAGE) 1000 MG tablet Take 1,000 mg  by mouth 2 (two) times daily with a meal.  . metoprolol (LOPRESSOR) 50 MG tablet Take 1 tablet (50 mg total) by mouth 2 (two) times daily.  . Multiple Vitamins-Minerals (CENTRUM SILVER 50+MEN) TABS Take 1 tablet by mouth daily with breakfast.  . Netarsudil-Latanoprost (ROCKLATAN) 0.02-0.005 % SOLN Place 1 drop into the right eye at bedtime.   Marland Kitchen POTASSIUM PO Take 1 tablet by mouth 3 (three) times a week.  . ranolazine (RANEXA) 500 MG 12 hr  tablet Take 1 tablet (500 mg total) by mouth 2 (two) times daily.     Allergies:   Patient has no known allergies.   Social History   Socioeconomic History  . Marital status: Legally Separated    Spouse name: Not on file  . Number of children: Not on file  . Years of education: Not on file  . Highest education level: Not on file  Occupational History  . Not on file  Tobacco Use  . Smoking status: Current Every Day Smoker    Packs/day: 1.00    Years: 40.00    Pack years: 40.00    Types: Cigarettes  . Smokeless tobacco: Current User    Types: Chew  Vaping Use  . Vaping Use: Never used  Substance and Sexual Activity  . Alcohol use: No  . Drug use: Yes    Types: Marijuana  . Sexual activity: Yes  Other Topics Concern  . Not on file  Social History Narrative  . Not on file   Social Determinants of Health   Financial Resource Strain: Not on file  Food Insecurity: Not on file  Transportation Needs: Not on file  Physical Activity: Not on file  Stress: Not on file  Social Connections: Not on file     Family History: The patient's family history includes CAD in his father; Diabetes in his father; Heart failure in his mother; Hypertension in his mother; Prostate cancer in his brother.  ROS:   Review of Systems  Constitution: Negative for decreased appetite, fever and weight gain.  HENT: Negative for congestion, ear discharge, hoarse voice and sore throat.   Eyes: Negative for discharge, redness, vision loss in right eye and visual halos.  Cardiovascular: Negative for chest pain, dyspnea on exertion, leg swelling, orthopnea and palpitations.  Respiratory: Negative for cough, hemoptysis, shortness of breath and snoring.   Endocrine: Negative for heat intolerance and polyphagia.  Hematologic/Lymphatic: Negative for bleeding problem. Does not bruise/bleed easily.  Skin: Negative for flushing, nail changes, rash and suspicious lesions.  Musculoskeletal: Negative for  arthritis, joint pain, muscle cramps, myalgias, neck pain and stiffness.  Gastrointestinal: Negative for abdominal pain, bowel incontinence, diarrhea and excessive appetite.  Genitourinary: Negative for decreased libido, genital sores and incomplete emptying.  Neurological: Negative for brief paralysis, focal weakness, headaches and loss of balance.  Psychiatric/Behavioral: Negative for altered mental status, depression and suicidal ideas.  Allergic/Immunologic: Negative for HIV exposure and persistent infections.    EKGs/Labs/Other Studies Reviewed:    The following studies were reviewed today:   EKG: None today  Recent Labs: 11/24/2019: Magnesium 1.5 11/25/2019: BUN 16; Potassium 4.2; Sodium 138 11/26/2019: Creatinine, Ser 0.97 11/28/2019: Hemoglobin 10.2; Platelets 247  Recent Lipid Panel    Component Value Date/Time   CHOL 82 11/27/2019 1004   CHOL 172 12/10/2018 1058   TRIG 60 11/27/2019 1004   HDL 37 (L) 11/27/2019 1004   HDL 43 12/10/2018 1058   CHOLHDL 2.2 11/27/2019 1004   VLDL 12 11/27/2019 1004  LDLCALC 33 11/27/2019 1004   LDLCALC 100 (H) 12/10/2018 1058    Physical Exam:    VS:  BP (!) 154/80   Pulse 76   Ht 5\' 10"  (1.778 m)   Wt 210 lb 3.2 oz (95.3 kg)   SpO2 96%   BMI 30.16 kg/m     Wt Readings from Last 3 Encounters:  04/15/20 210 lb 3.2 oz (95.3 kg)  11/28/19 199 lb 4.8 oz (90.4 kg)  11/24/19 210 lb 9.6 oz (95.5 kg)     GEN: Well nourished, well developed in no acute distress HEENT: Normal NECK: No JVD; No carotid bruits LYMPHATICS: No lymphadenopathy CARDIAC: S1S2 noted,RRR, no murmurs, rubs, gallops RESPIRATORY:  Clear to auscultation without rales, wheezing or rhonchi  ABDOMEN: Soft, non-tender, non-distended, +bowel sounds, no guarding. EXTREMITIES: No edema, No cyanosis, no clubbing MUSCULOSKELETAL:  No deformity  SKIN: Warm and dry NEUROLOGIC:  Alert and oriented x 3, non-focal PSYCHIATRIC:  Normal affect, good  insight  ASSESSMENT:    1. Shortness of breath    PLAN:    He has stopped his Repatha as well as his Imdur.  He prefers not to start this medication again.  He is having mild intermittent chest pain and he still short of breath.  We will start patient on Ranexa 500 mg twice a day.  In addition he is a lifelong smoker and I do think that lung pathology may be playing a role here.  We will refer the patient to pulmonary.  In terms of his coronary artery disease I will.  Ranexa help with his symptoms.  After his evaluation with pulmonary if the patient does not have any lung pathology that needs to be addressed and symptoms worsen we will pursue a left heart catheterization.  But right now we have to monitor closely given his recent GI bleed.  His blood pressure is elevated.  He tells me that he did not take his medication this morning advised the patient to go ahead and please start his medication as prescribed.  Smoking cessation advised  The patient is in agreement with the above plan. The patient left the office in stable condition.  The patient will follow up in 6 months or sooner if needed.   Medication Adjustments/Labs and Tests Ordered: Current medicines are reviewed at length with the patient today.  Concerns regarding medicines are outlined above.  Orders Placed This Encounter  Procedures  . Ambulatory referral to Pulmonology   Meds ordered this encounter  Medications  . ranolazine (RANEXA) 500 MG 12 hr tablet    Sig: Take 1 tablet (500 mg total) by mouth 2 (two) times daily.    Dispense:  60 tablet    Refill:  12    Patient Instructions  Medication Instructions:  Your physician has recommended you make the following change in your medication:   Start Ranexa 500 mg twice daily.  *If you need a refill on your cardiac medications before your next appointment, please call your pharmacy*   Lab Work: None ordered If you have labs (blood work) drawn today and your tests  are completely normal, you will receive your results only by: 11/26/19 MyChart Message (if you have MyChart) OR . A paper copy in the mail If you have any lab test that is abnormal or we need to change your treatment, we will call you to review the results.   Testing/Procedures: None ordered   Follow-Up: At [facility], you and your health needs are our  priority.  As part of our continuing mission to provide you with exceptional heart care, we have created designated Provider Care Teams.  These Care Teams include your primary Cardiologist (physician) and Advanced Practice Providers (APPs -  Physician Assistants and Nurse Practitioners) who all work together to provide you with the care you need, when you need it.  We recommend signing up for the patient portal called "MyChart".  Sign up information is provided on this After Visit Summary.  MyChart is used to connect with patients for Virtual Visits (Telemedicine).  Patients are able to view lab/test results, encounter notes, upcoming appointments, etc.  Non-urgent messages can be sent to your provider as well.   To learn more about what you can do with MyChart, go to [email or web address].    Your next appointment:   4 month(s)  The format for your next appointment:   In Person  Provider:   Thomasene RippleKardie Jerriyah Louis, DO   Other Instructions Ranolazine tablets, extended release What is this medicine? RANOLAZINE (ra NOE la zeen) is a heart medicine. It is used to treat chronic chest pain (angina). This medicine must be taken regularly. It will not relieve an acute episode of chest pain. This medicine may be used for other purposes; ask your health care provider or pharmacist if you have questions. COMMON BRAND NAME(S): Ranexa What should I tell my health care provider before I take this medicine? They need to know if you have any of these conditions:  heart disease  irregular heartbeat  kidney disease  liver disease  low levels of potassium  or magnesium in the blood  an unusual or allergic reaction to ranolazine, other medicines, foods, dyes, or preservatives  pregnant or trying to get pregnant  breast-feeding How should I use this medicine? Take this medicine by mouth with a glass of water. Follow the directions on the prescription label. Do not cut, crush, or chew this medicine. Take with or without food. Do not take this medication with grapefruit juice. Take your doses at regular intervals. Do not take your medicine more often then directed. Talk to your pediatrician regarding the use of this medicine in children. Special care may be needed. Overdosage: If you think you have taken too much of this medicine contact a poison control center or emergency room at once. NOTE: This medicine is only for you. Do not share this medicine with others. What if I miss a dose? If you miss a dose, take it as soon as you can. If it is almost time for your next dose, take only that dose. Do not take double or extra doses. What may interact with this medicine? Do not take this medicine with any of the following medications:  antivirals for HIV or AIDS  cerivastatin  certain antibiotics like chloramphenicol, clarithromycin, dalfopristin; quinupristin, isoniazid, rifabutin, rifampin, rifapentine  certain medicines used for cancer like imatinib, nilotinib  certain medicines for fungal infections like fluconazole, itraconazole, ketoconazole, posaconazole, voriconazole  certain medicines for irregular heart beat like dronedarone  certain medicines for seizures like carbamazepine, fosphenytoin, oxcarbazepine, phenobarbital, phenytoin  cisapride  conivaptan  cyclosporine  grapefruit or grapefruit juice  lumacaftor; ivacaftor  nefazodone  pimozide  quinacrine  St John's wort  thioridazine This medicine may also interact with the following medications:  alfuzosin  certain medicines for depression, anxiety, or psychotic  disturbances like bupropion, citalopram, fluoxetine, fluphenazine, paroxetine, perphenazine, risperidone, sertraline, trifluoperazine  certain medicines for cholesterol like atorvastatin, lovastatin, simvastatin  certain medicines for stomach problems like  octreotide, palonosetron, prochlorperazine  eplerenone  ergot alkaloids like dihydroergotamine, ergonovine, ergotamine, methylergonovine  metformin  nicardipine  other medicines that prolong the QT interval (cause an abnormal heart rhythm) like dofetilide, ziprasidone  sirolimus  tacrolimus This list may not describe all possible interactions. Give your health care provider a list of all the medicines, herbs, non-prescription drugs, or dietary supplements you use. Also tell them if you smoke, drink alcohol, or use illegal drugs. Some items may interact with your medicine. What should I watch for while using this medicine? Visit your doctor for regular check ups. Tell your doctor or healthcare professional if your symptoms do not start to get better or if they get worse. This medicine will not relieve an acute attack of angina or chest pain. This medicine can change your heart rhythm. Your health care provider may check your heart rhythm by ordering an electrocardiogram (ECG) while you are taking this medicine. You may get drowsy or dizzy. Do not drive, use machinery, or do anything that needs mental alertness until you know how this medicine affects you. Do not stand or sit up quickly, especially if you are an older patient. This reduces the risk of dizzy or fainting spells. Alcohol may interfere with the effect of this medicine. Avoid alcoholic drinks. If you are scheduled for any medical or dental procedure, tell your healthcare provider that you are taking this medicine. This medicine can interact with other medicines used during surgery. What side effects may I notice from receiving this medicine? Side effects that you should report  to your doctor or health care professional as soon as possible:  allergic reactions like skin rash, itching or hives, swelling of the face, lips, or tongue  breathing problems  changes in vision  fast, irregular or pounding heartbeat  feeling faint or lightheaded, falls  low or high blood pressure  numbness or tingling feelings  ringing in the ears  tremor or shakiness  slow heartbeat (fewer than 50 beats per minute)  swelling of the legs or feet Side effects that usually do not require medical attention (report to your doctor or health care professional if they continue or are bothersome):  constipation  drowsy  dry mouth  headache  nausea or vomiting  stomach upset This list may not describe all possible side effects. Call your doctor for medical advice about side effects. You may report side effects to FDA at 1-800-FDA-1088. Where should I keep my medicine? Keep out of the reach of children. Store at room temperature between 15 and 30 degrees C (59 and 86 degrees F). Throw away any unused medicine after the expiration date. NOTE: This sheet is a summary. It may not cover all possible information. If you have questions about this medicine, talk to your doctor, pharmacist, or health care provider.  2021 Elsevier/Gold Standard (2018-01-14 09:18:49)      Adopting a Healthy Lifestyle.  Know what a healthy weight is for you (roughly BMI <25) and aim to maintain this   Aim for 7+ servings of fruits and vegetables daily   65-80+ fluid ounces of water or unsweet tea for healthy kidneys   Limit to max 1 drink of alcohol per day; avoid smoking/tobacco   Limit animal fats in diet for cholesterol and heart health - choose grass fed whenever available   Avoid highly processed foods, and foods high in saturated/trans fats   Aim for low stress - take time to unwind and care for your mental health   Aim  for 150 min of moderate intensity exercise weekly for heart  health, and weights twice weekly for bone health   Aim for 7-9 hours of sleep daily   When it comes to diets, agreement about the perfect plan isnt easy to find, even among the experts. Experts at the [facility] of [employer] developed an idea known as the Healthy Eating Plate. Just imagine a plate divided into logical, healthy portions.   The emphasis is on diet quality:   Load up on vegetables and fruits - one-half of your plate: Aim for color and variety, and remember that potatoes dont count.   Go for whole grains - one-quarter of your plate: Whole wheat, barley, wheat berries, quinoa, oats, brown rice, and foods made with them. If you want pasta, go with whole wheat pasta.   Protein power - one-quarter of your plate: Fish, chicken, beans, and nuts are all healthy, versatile protein sources. Limit red meat.   The diet, however, does go beyond the plate, offering a few other suggestions.   Use healthy plant oils, such as olive, canola, soy, corn, sunflower and peanut. Check the labels, and avoid partially hydrogenated oil, which have unhealthy trans fats.   If youre thirsty, drink water. Coffee and tea are good in moderation, but skip sugary drinks and limit milk and dairy products to one or two daily servings.   The type of carbohydrate in the diet is more important than the amount. Some sources of carbohydrates, such as vegetables, fruits, whole grains, and beans-are healthier than others.   Finally, stay active  Signed, Thomasene Ripple, DO  04/15/2020 12:20 PM    [facility]

## 2020-04-15 NOTE — Patient Instructions (Addendum)
Medication Instructions:  Your physician has recommended you make the following change in your medication:   Start Ranexa 500 mg twice daily.  *If you need a refill on your cardiac medications before your next appointment, please call your pharmacy*   Lab Work: None ordered If you have labs (blood work) drawn today and your tests are completely normal, you will receive your results only by: Marland Kitchen MyChart Message (if you have MyChart) OR . A paper copy in the mail If you have any lab test that is abnormal or we need to change your treatment, we will call you to review the results.   Testing/Procedures: None ordered   Follow-Up: At [facility], you and your health needs are our priority.  As part of our continuing mission to provide you with exceptional heart care, we have created designated Provider Care Teams.  These Care Teams include your primary Cardiologist (physician) and Advanced Practice Providers (APPs -  Physician Assistants and Nurse Practitioners) who all work together to provide you with the care you need, when you need it.  We recommend signing up for the patient portal called "MyChart".  Sign up information is provided on this After Visit Summary.  MyChart is used to connect with patients for Virtual Visits (Telemedicine).  Patients are able to view lab/test results, encounter notes, upcoming appointments, etc.  Non-urgent messages can be sent to your provider as well.   To learn more about what you can do with MyChart, go to [email or web address].    Your next appointment:   4 month(s)  The format for your next appointment:   In Person  Provider:   Thomasene Ripple, DO   Other Instructions Ranolazine tablets, extended release What is this medicine? RANOLAZINE (ra NOE la zeen) is a heart medicine. It is used to treat chronic chest pain (angina). This medicine must be taken regularly. It will not relieve an acute episode of chest pain. This medicine may be used for  other purposes; ask your health care provider or pharmacist if you have questions. COMMON BRAND NAME(S): Ranexa What should I tell my health care provider before I take this medicine? They need to know if you have any of these conditions:  heart disease  irregular heartbeat  kidney disease  liver disease  low levels of potassium or magnesium in the blood  an unusual or allergic reaction to ranolazine, other medicines, foods, dyes, or preservatives  pregnant or trying to get pregnant  breast-feeding How should I use this medicine? Take this medicine by mouth with a glass of water. Follow the directions on the prescription label. Do not cut, crush, or chew this medicine. Take with or without food. Do not take this medication with grapefruit juice. Take your doses at regular intervals. Do not take your medicine more often then directed. Talk to your pediatrician regarding the use of this medicine in children. Special care may be needed. Overdosage: If you think you have taken too much of this medicine contact a poison control center or emergency room at once. NOTE: This medicine is only for you. Do not share this medicine with others. What if I miss a dose? If you miss a dose, take it as soon as you can. If it is almost time for your next dose, take only that dose. Do not take double or extra doses. What may interact with this medicine? Do not take this medicine with any of the following medications:  antivirals for HIV or AIDS  cerivastatin  certain antibiotics like chloramphenicol, clarithromycin, dalfopristin; quinupristin, isoniazid, rifabutin, rifampin, rifapentine  certain medicines used for cancer like imatinib, nilotinib  certain medicines for fungal infections like fluconazole, itraconazole, ketoconazole, posaconazole, voriconazole  certain medicines for irregular heart beat like dronedarone  certain medicines for seizures like carbamazepine, fosphenytoin,  oxcarbazepine, phenobarbital, phenytoin  cisapride  conivaptan  cyclosporine  grapefruit or grapefruit juice  lumacaftor; ivacaftor  nefazodone  pimozide  quinacrine  St John's wort  thioridazine This medicine may also interact with the following medications:  alfuzosin  certain medicines for depression, anxiety, or psychotic disturbances like bupropion, citalopram, fluoxetine, fluphenazine, paroxetine, perphenazine, risperidone, sertraline, trifluoperazine  certain medicines for cholesterol like atorvastatin, lovastatin, simvastatin  certain medicines for stomach problems like octreotide, palonosetron, prochlorperazine  eplerenone  ergot alkaloids like dihydroergotamine, ergonovine, ergotamine, methylergonovine  metformin  nicardipine  other medicines that prolong the QT interval (cause an abnormal heart rhythm) like dofetilide, ziprasidone  sirolimus  tacrolimus This list may not describe all possible interactions. Give your health care provider a list of all the medicines, herbs, non-prescription drugs, or dietary supplements you use. Also tell them if you smoke, drink alcohol, or use illegal drugs. Some items may interact with your medicine. What should I watch for while using this medicine? Visit your doctor for regular check ups. Tell your doctor or healthcare professional if your symptoms do not start to get better or if they get worse. This medicine will not relieve an acute attack of angina or chest pain. This medicine can change your heart rhythm. Your health care provider may check your heart rhythm by ordering an electrocardiogram (ECG) while you are taking this medicine. You may get drowsy or dizzy. Do not drive, use machinery, or do anything that needs mental alertness until you know how this medicine affects you. Do not stand or sit up quickly, especially if you are an older patient. This reduces the risk of dizzy or fainting spells. Alcohol may  interfere with the effect of this medicine. Avoid alcoholic drinks. If you are scheduled for any medical or dental procedure, tell your healthcare provider that you are taking this medicine. This medicine can interact with other medicines used during surgery. What side effects may I notice from receiving this medicine? Side effects that you should report to your doctor or health care professional as soon as possible:  allergic reactions like skin rash, itching or hives, swelling of the face, lips, or tongue  breathing problems  changes in vision  fast, irregular or pounding heartbeat  feeling faint or lightheaded, falls  low or high blood pressure  numbness or tingling feelings  ringing in the ears  tremor or shakiness  slow heartbeat (fewer than 50 beats per minute)  swelling of the legs or feet Side effects that usually do not require medical attention (report to your doctor or health care professional if they continue or are bothersome):  constipation  drowsy  dry mouth  headache  nausea or vomiting  stomach upset This list may not describe all possible side effects. Call your doctor for medical advice about side effects. You may report side effects to FDA at 1-800-FDA-1088. Where should I keep my medicine? Keep out of the reach of children. Store at room temperature between 15 and 30 degrees C (59 and 86 degrees F). Throw away any unused medicine after the expiration date. NOTE: This sheet is a summary. It may not cover all possible information. If you have questions about this medicine, talk to your   doctor, pharmacist, or health care provider.  2021 Elsevier/Gold Standard (2018-01-14 09:18:49)

## 2020-05-05 ENCOUNTER — Other Ambulatory Visit: Payer: Self-pay

## 2020-05-05 ENCOUNTER — Ambulatory Visit (INDEPENDENT_AMBULATORY_CARE_PROVIDER_SITE_OTHER): Payer: Medicaid Other | Admitting: Internal Medicine

## 2020-05-05 ENCOUNTER — Encounter: Payer: Self-pay | Admitting: Internal Medicine

## 2020-05-05 VITALS — BP 130/72 | HR 71 | Temp 97.6°F | Ht 70.0 in | Wt 206.4 lb

## 2020-05-05 DIAGNOSIS — Z87891 Personal history of nicotine dependence: Secondary | ICD-10-CM | POA: Diagnosis not present

## 2020-05-05 DIAGNOSIS — R053 Chronic cough: Secondary | ICD-10-CM

## 2020-05-05 DIAGNOSIS — R0600 Dyspnea, unspecified: Secondary | ICD-10-CM

## 2020-05-05 DIAGNOSIS — R062 Wheezing: Secondary | ICD-10-CM | POA: Diagnosis not present

## 2020-05-05 DIAGNOSIS — R0602 Shortness of breath: Secondary | ICD-10-CM

## 2020-05-05 DIAGNOSIS — R0609 Other forms of dyspnea: Secondary | ICD-10-CM

## 2020-05-05 DIAGNOSIS — Z862 Personal history of diseases of the blood and blood-forming organs and certain disorders involving the immune mechanism: Secondary | ICD-10-CM

## 2020-05-05 MED ORDER — SPIRIVA RESPIMAT 2.5 MCG/ACT IN AERS: 2.0000 | INHALATION_SPRAY | Freq: Every day | RESPIRATORY_TRACT | 0 refills | Status: DC

## 2020-05-05 NOTE — Addendum Note (Signed)
Addended by: Demetrio Lapping E on: 05/05/2020 03:22 PM   Modules accepted: Orders

## 2020-05-05 NOTE — Addendum Note (Signed)
Addended bySandra Cockayne on: 05/05/2020 05:21 PM   Modules accepted: Orders

## 2020-05-05 NOTE — Addendum Note (Signed)
Addended by: Demetrio Lapping E on: 05/05/2020 03:40 PM   Modules accepted: Orders

## 2020-05-05 NOTE — Patient Instructions (Addendum)
ICD-10-CM   1. Dyspnea on exertion  R06.00   2. Chronic cough  R05.3   3. Wheezing  R06.2   4. History of smoking 25-50 pack years  Z87.891   5. History of anemia  Z86.2     I suspect your shortness of breath this is on account of COPD and also anemia You are having wheezing  Plan -Start Spiriva Respimat 2 puffs once daily sample -Do full pulmonary function test in the next few to several days -Do high-resolution CT chest supine and prone the next few to several days -Do CBC and chemistry panel today -Do alpha 1 antitrypsin phenotype today  Follow-up -Return to see nurse practitioner in the next week or 2 but after completing the results  -Go to ER if worse  -Simple walking desaturation test and CAT score at follow-up.  But

## 2020-05-05 NOTE — Addendum Note (Signed)
Addended bySandra Cockayne on: 05/05/2020 03:20 PM   Modules accepted: Orders

## 2020-05-05 NOTE — Progress Notes (Signed)
OV 05/05/2020  Subjective:  Patient ID: Vincent Guerrero, male , DOB: February 04, 1961 , age 60 y.o. , MRN: [id number] , ADDRESS: [address] [place] [address] PCP Simone Curia, MD Patient Care Team: Simone Curia, MD as PCP - General (Internal Medicine) Thomasene Ripple, DO as PCP - Cardiology (Cardiology)  This Provider for this visit: Treatment Team:  Attending Provider: Kalman Shan, MD    05/05/2020 -   Chief Complaint  Patient presents with  . Consult    SOB for about a year     HPI [name] 60 y.o. -40 pack smoking history. presents for new consultation of shortness of breath insidious onset of shortness of breath for last 1 year.  Progressive.  Present for exertion relieved by rest.  Activities such as eating or taking a shower or changing clothes does not make him short of breath.  But activities such as climbing a Guerrero of stairs makes him short of breath.  No associated chest pain.  There is some coughing and wheezing and white sputum production at night.    Chest x-ray October 2021 personally visualized.  To me it looks like hyperinflation.  The official report is that it is clear.  Echocardiogram September 2021: Shows normal ejection fraction with moderate aortic stenosis and grade 1 diastolic dysfunction.  Cardiac cath in 2018 3 lesions between 30% and 60%.  Aggressive risk factor modification was then recommended.  He is he is not aware of his anemia problems. PFT  No flowsheet data found.  Results for VADHIR, MCNAY (MRN [id number]) as of 05/05/2020 14:53  Ref. 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   reports that he has been smoking cigarettes. He has a 40.00 pack-year smoking history. His smokeless tobacco use includes chew.  Past Surgical History:  Procedure Laterality Date  . BIOPSY  11/27/2019   Procedure: BIOPSY;  Surgeon: Sherrilyn Rist, MD;  Location[facility] ENDOSCOPY;  Service: Gastroenterology;;  . COLONOSCOPY WITH PROPOFOL N/A 11/27/2019   Procedure: COLONOSCOPY WITH PROPOFOL;  Surgeon: Sherrilyn Rist, MD;  Location: [facility] ENDOSCOPY;  Service: Gastroenterology;  Laterality: N/A;  . ESOPHAGOGASTRODUODENOSCOPY (EGD) WITH PROPOFOL N/A 11/27/2019   Procedure: ESOPHAGOGASTRODUODENOSCOPY (EGD) WITH PROPOFOL;  Surgeon: Sherrilyn Rist, MD;  Location: [facility] ENDOSCOPY;  Service: Gastroenterology;  Laterality: N/A;  . HEMOSTASIS CLIP PLACEMENT  11/27/2019   Procedure: HEMOSTASIS CLIP PLACEMENT;  Surgeon: Sherrilyn Rist, MD;  Location: [facility] ENDOSCOPY;  Service: Gastroenterology;;  . HERNIA REPAIR  1998  . LEFT HEART CATH AND CORONARY ANGIOGRAPHY N/A 06/04/2016   Procedure: Left Heart Cath and Coronary Angiography;  Surgeon: Lyn Records, MD;  Location: [facility] INVASIVE CV LAB;  Service: Cardiovascular;  Laterality: N/A;    No Known Allergies  Immunization History  Administered Date(s)  Administered  . Pneumococcal Polysaccharide-23 11/28/2019    Family History  Problem Relation Age of Onset  . Heart failure Mother   . Hypertension Mother   . CAD Father   . Diabetes Father   . Prostate cancer Brother       Current Outpatient Medications:  .  acetaminophen (TYLENOL) 325 MG tablet, Take 2 tablets (650 mg total) by mouth every 6 (six) hours as needed for mild pain (or Fever >/= 101)., Disp: 60 tablet, Rfl: 0 .  glipiZIDE (GLUCOTROL) 10 MG tablet, Take 10 mg by mouth 2 (two) times daily before a meal. , Disp: , Rfl:  .  lisinopril (PRINIVIL,ZESTRIL) 20 MG tablet, Take 1 tablet (20 mg total) by mouth 2 (two) times daily., Disp: 180 tablet, Rfl: 2 .  metFORMIN (GLUCOPHAGE) 1000 MG tablet, Take 1,000 mg by mouth 2 (two) times daily with a meal., Disp: , Rfl:  .  metoprolol (LOPRESSOR) 50 MG tablet, Take 1 tablet (50 mg total) by mouth 2 (two) times daily., Disp: 180 tablet, Rfl: 2 .  Multiple Vitamins-Minerals (CENTRUM SILVER 50+MEN) TABS, Take 1 tablet by mouth daily with breakfast., Disp: , Rfl:  .  Netarsudil-Latanoprost (ROCKLATAN) 0.02-0.005 % SOLN, Place 1 drop into the right eye at bedtime. , Disp: , Rfl:  .  POTASSIUM PO, Take 1 tablet by mouth 3 (three) times a week., Disp: , Rfl:  .  ranolazine (RANEXA) 500 MG 12 hr tablet, Take 1 tablet (500 mg total) by mouth 2 (two) times daily., Disp: 60 tablet, Rfl: 12      Objective:   Vitals:   05/05/20 1432  BP: 130/72  Pulse: 71  Temp: 97.6 F (36.4 C)  TempSrc: Oral  SpO2: 100%  Weight: 206 lb 6.4 oz (93.6 kg)  Height: 5\' 10"  (1.778 m)    Estimated body mass index is 29.62 kg/m as calculated from the following:   Height as of this encounter: 5\' 10"  (1.778 m).   Weight as of this encounter: 206 lb 6.4 oz (93.6 kg).  @WEIGHTCHANGE @    05/05/20 1432  Weight: 206 lb 6.4 oz (93.6 kg)     Physical Exam  General Appearance:    Alert, cooperative, no distress, appears stated age - yes , Deconditioned looking - no , OBESE  - no, Sitting on Wheelchair -  no  Head:    Normocephalic, without obvious abnormality, atraumatic  Eyes:    PERRL, conjunctiva/corneas clear,  Ears:    Normal TM's and external ear canals, both  ears  Nose:   Nares normal, septum midline, mucosa normal, no drainage    or sinus tenderness. OXYGEN ON  - no . Patient is @ ra   Throat:   Lips, mucosa, and tongue normal; teeth and gums normal. Cyanosis on lips - no  Neck:   Supple, symmetrical, trachea midline, no adenopathy;    thyroid:  no enlargement/tenderness/nodules; no carotid   bruit or JVD  Back:     Symmetric, no curvature, ROM normal, no CVA tenderness  Lungs:     Distress - no , Wheeze mild yes, Barrell Chest - no, Purse lip breathing - no, Crackles - no   Chest Wall:    No tenderness or deformity.    Heart:    Regular rate and rhythm, S1 and S2 normal, no rub   or gallop, Murmur - no  Breast Exam:    NOT DONE  Abdomen:     Soft, non-tender, bowel sounds active  all four quadrants,    no masses, no organomegaly. Visceral obesity - no  Genitalia:   NOT DONE  Rectal:   NOT DONE  Extremities:   Extremities - normal, Has Cane - no, Clubbing - no, Edema - no  Pulses:   2+ and symmetric all extremities  Skin:   Stigmata of Connective Tissue Disease - no  Lymph nodes:   Cervical, supraclavicular, and axillary nodes normal  Psychiatric:  Neurologic:   Pleasant - yes, Anxious - no, Flat affect - yes  CAm-ICU - neg, Alert and Oriented x 3 - yes, Moves all 4s - yes, Speech - normal, Cognition - intact   \     Assessment:       ICD-10-CM   1. Dyspnea on exertion  R06.00   2. Chronic cough  R05.3   3. Wheezing  R06.2   4. History of smoking 25-50 pack years  Z87.891   5. History of anemia  Z86.2        Plan:     Patient Instructions     ICD-10-CM   1. Dyspnea on exertion  R06.00   2. Chronic cough  R05.3   3. Wheezing  R06.2   4. History of smoking 25-50 pack years  Z87.891   5. History of anemia  Z86.2     I suspect your shortness of breath this is on account of COPD and also anemia You are having wheezing  Plan -Start Spiriva Respimat 2 puffs once daily sample -Do full pulmonary function test in the next  few to several days -Do high-resolution CT chest supine and prone the next few to several days -Do CBC and chemistry panel today -Do alpha 1 antitrypsin phenotype today  Follow-up -Return to see nurse practitioner in the next week or 2 but after completing the results  -Go to ER if worse  -Simple walking desaturation test and CAT score at follow-up.  But     SIGNATURE    Dr. Kalman Shan, M.D., F.C.C.P,  Pulmonary and Critical Care Medicine Staff Physician, [facility] Center Director - Interstitial Lung Disease  Program  Pulmonary Fibrosis [facility] at [facility] [place], [place], [address]  Pager: [phone], If no answer or between  15:00h - 7:00h: call [phone] Telephone: [phone]  3:07 PM 05/05/2020

## 2020-05-05 NOTE — Addendum Note (Signed)
Addended by: Demetrio Lapping E on: 05/05/2020 03:37 PM   Modules accepted: Orders

## 2020-05-05 NOTE — Addendum Note (Signed)
Addended by: Demetrio Lapping E on: 05/05/2020 03:38 PM   Modules accepted: Orders

## 2020-05-06 LAB — COMPREHENSIVE METABOLIC PANEL
ALT: 9 U/L (ref 0–53)
AST: 15 U/L (ref 0–37)
Albumin: 4 g/dL (ref 3.5–5.2)
Alkaline Phosphatase: 51 U/L (ref 39–117)
BUN: 22 mg/dL (ref 6–23)
CO2: 26 mEq/L (ref 19–32)
Calcium: 9.4 mg/dL (ref 8.4–10.5)
Chloride: 107 mEq/L (ref 96–112)
Creatinine, Ser: 1.23 mg/dL (ref 0.40–1.50)
GFR: 64.28 mL/min (ref >60.00)
Glucose, Bld: 89 mg/dL (ref 70–99)
Potassium: 4.9 mEq/L (ref 3.5–5.1)
Sodium: 140 mEq/L (ref 135–145)
Total Bilirubin: 0.3 mg/dL (ref 0.2–1.2)
Total Protein: 6.7 g/dL (ref 6.0–8.3)

## 2020-05-06 LAB — CBC WITH DIFFERENTIAL/PLATELET
Basophils Relative: 1.4 % (ref 0.0–3.0)
Eosinophils Relative: 1.4 % (ref 0.0–5.0)
HCT: 29 % — ABNORMAL LOW (ref 39.0–52.0)
Hemoglobin: 8.9 g/dL — ABNORMAL LOW (ref 13.0–17.0)
Lymphocytes Relative: 35.8 % (ref 12.0–46.0)
MCHC: 30.7 g/dL (ref 30.0–36.0)
MCV: 63.6 fl — ABNORMAL LOW (ref 78.0–100.0)
Monocytes Relative: 9.3 % (ref 3.0–12.0)
Neutrophils Relative %: 52.1 % (ref 43.0–77.0)
Platelets: 239 10*3/uL (ref 150.0–400.0)
RBC: 4.56 Mil/uL (ref 4.22–5.81)
RDW: 20.2 % — ABNORMAL HIGH (ref 11.5–15.5)
WBC: 9.6 10*3/uL (ref 4.0–10.5)

## 2020-05-12 NOTE — Progress Notes (Signed)
Hgb worse than 5 mon ago but similar to 6 months ago at 8.9gm%. Needs to followup with PCP  Simone Curia, MD

## 2020-05-17 ENCOUNTER — Telehealth: Payer: Self-pay | Admitting: Internal Medicine

## 2020-05-17 NOTE — Telephone Encounter (Signed)
Ideally patient needs a CT high-resolution CT chest on account of dyspnea and cough but if insurance will only allow to do x-ray that is fine for the moment

## 2020-05-17 NOTE — Telephone Encounter (Signed)
chest ct Received: Today Ottinger, Lesia Sago, MD; Shaw[place]equa Baldridge, Farley Ly, CMA Chest ct denied pt needs cxr and pfts first I have not notified this pt of this yet     Staff message sent by [facility] which is shown above. Pt is scheduled for PFT 05/24/20 with a follow up at that time. When pt comes in for this, we can get a cxr performed at that time.  Routing to [place] as an FYI so she is aware that pt will be coming to get this taken care of next week. Also routing to MR as an FYI as well as to [facility] who will be seeing pt on 4/19.

## 2020-05-18 LAB — ALPHA-1 ANTITRYPSIN PHENOTYPE: A-1 Antitrypsin, Ser: 135 mg/dL (ref 83–199)

## 2020-05-18 NOTE — Telephone Encounter (Signed)
After the chest xray is done and also once the PFT is done, we can then be able to get the HRCT ordered but these need to be done first. Pt comes in 4/19 for the PFT and we just need to order the chest xray prior to pt leaving office after appt and then [facility] can do precert for the HRCT and get it scheduled for pt.

## 2020-05-20 ENCOUNTER — Other Ambulatory Visit: Payer: Medicaid Other

## 2020-05-24 ENCOUNTER — Ambulatory Visit: Payer: Medicaid Other | Admitting: Primary Care

## 2020-05-24 NOTE — Progress Notes (Signed)
Alpha 1 is MM and normal

## 2020-05-24 NOTE — Progress Notes (Deleted)
@Patient  ID: , male    DOB: 1960-06-30, 60 y.o.   MRN: [id number]  No chief complaint on file.   Referring provider: [id number], MD  HPI: 60 year old male, current everyday smoker (40-pack-year history).  Past medical history significant for hypertension, coronary artery disease, diabetes, anemia, hyperlipidemia, glaucoma of both eyes.  Patient of Dr. 46, seen for initial consult on 05/05/2020 for dyspnea on exertion.  [facility] pulmonary encounters:  HPI Vincent Guerrero 60 y.o. -40 pack smoking history. presents for new consultation of shortness of breath insidious onset of shortness of breath for last 1 year.  Progressive.  Present for exertion relieved by rest.  Activities such as eating or taking a shower or changing clothes does not make him short of breath.  But activities such as climbing a flight of stairs makes him short of breath.  No associated chest pain.  There is some coughing and wheezing and white sputum production at night.    Chest x-ray October 2021 personally visualized.  To me it looks like hyperinflation.  The official report is that it is clear.  Echocardiogram September 2021: Shows normal ejection fraction with moderate aortic stenosis and grade 1 diastolic dysfunction.  Cardiac cath in 2018 3 lesions between 30% and 60%.  Aggressive risk factor modification was then recommended.  He is he is not aware of his anemia problems. PFT  05/24/2020 Patient presents today for 2 to 4-week follow-up with PFTs. Patient saw Dr. 05/26/2020 in late March.  Suspected shortness of breath due to COPD and also anemia.  He was started on Spiriva Respimat 2 puffs once daily. HRCT has not yet been completed.  Hemoglobin 8.9, advised to follow-up with PCP.    Pulmonary testing:  05/05/20 Alpha-1 antitrypsin phenotype-MM, 135 05/05/20 HgB- 8.9  05/24/2020 PFTs-     No Known Allergies  Immunization History  Administered Date(s) Administered  .  Pneumococcal Polysaccharide-23 11/28/2019    Past Medical History:  Diagnosis Date  . Age-related nuclear cataract of both eyes 11/27/2018  . Anemia   . Bilateral recurrent inguinal hernia without obstruction or gangrene 05/09/2016   Added automatically from request for surgery 07/09/2016  . Chest pressure 11/06/2019  . Coronary artery disease involving native artery of transplanted heart without angina pectoris   . Cortical age-related cataract of both eyes 11/27/2018  . Depression   . Diabetes mellitus (HCC)   . Hyperlipidemia LDL goal <70   . Hypertension   . Inguinal hernia without obstruction or gangrene    BILATERAL  . Primary open angle glaucoma (POAG) of both eyes, severe stage 11/27/2018  . Primary open angle glaucoma (POAG) of left eye, severe stage 11/27/2018  . Tobacco abuse 11/05/2019  . Tobacco use 11/06/2019    Tobacco History: Social History   Tobacco Use  Smoking Status Current Every Day Smoker  . Packs/day: 1.00  . Years: 40.00  . Pack years: 40.00  . Types: Cigarettes  Smokeless Tobacco Current User  . Types: Chew  Tobacco Comment   half pack daily-05/05/2020-AH   Ready to quit: Not Answered Counseling given: Not Answered Comment: half pack daily-05/05/2020-AH   Outpatient Medications Prior to Visit  Medication Sig Dispense Refill  . acetaminophen (TYLENOL) 325 MG tablet Take 2 tablets (650 mg total) by mouth every 6 (six) hours as needed for mild pain (or Fever >/= 101). 60 tablet 0  . glipiZIDE (GLUCOTROL) 10 MG tablet Take 10 mg by mouth 2 (two) times daily before a meal.     .  lisinopril (PRINIVIL,ZESTRIL) 20 MG tablet Take 1 tablet (20 mg total) by mouth 2 (two) times daily. 180 tablet 2  . metFORMIN (GLUCOPHAGE) 1000 MG tablet Take 1,000 mg by mouth 2 (two) times daily with a meal.    . metoprolol (LOPRESSOR) 50 MG tablet Take 1 tablet (50 mg total) by mouth 2 (two) times daily. 180 tablet 2  . Multiple Vitamins-Minerals (CENTRUM SILVER 50+MEN) TABS  Take 1 tablet by mouth daily with breakfast.    . Netarsudil-Latanoprost (ROCKLATAN) 0.02-0.005 % SOLN Place 1 drop into the right eye at bedtime.     Marland Kitchen POTASSIUM PO Take 1 tablet by mouth 3 (three) times a week.    . ranolazine (RANEXA) 500 MG 12 hr tablet Take 1 tablet (500 mg total) by mouth 2 (two) times daily. 60 tablet 12  . Tiotropium Bromide Monohydrate (SPIRIVA RESPIMAT) 2.5 MCG/ACT AERS Inhale 2 puffs into the lungs daily. 3 each 0   No facility-administered medications prior to visit.      Review of Systems  Review of Systems   Physical Exam  There were no vitals taken for this visit. Physical Exam   Lab Results:  CBC    Component Value Date/Time   WBC 9.6 05/05/2020 1530   RBC 4.56 05/05/2020 1530   HGB 8.9 Repeated and verified X2. (L) 05/05/2020 1530   HGB 7.9 (L) 11/24/2019 1557   HCT 29.0 (L) 05/05/2020 1530   HCT 28.4 (L) 11/24/2019 1557   PLT 239.0 05/05/2020 1530   PLT 303 11/24/2019 1557   MCV 63.6 Repeated and verified X2. (L) 05/05/2020 1530   MCV 63 (L) 11/24/2019 1557   MCH 19.1 (L) 11/28/2019 0641   MCHC 30.7 05/05/2020 1530   RDW 20.2 (H) 05/05/2020 1530   RDW 20.1 (H) 11/24/2019 1557   LYMPHSABS 2.9 11/24/2019 1557   EOSABS 0.1 11/24/2019 1557   BASOSABS 0.0 11/24/2019 1557    BMET    Component Value Date/Time   NA 140 05/05/2020 1530   NA 140 11/24/2019 1557   K 4.9 05/05/2020 1530   CL 107 05/05/2020 1530   CO2 26 05/05/2020 1530   GLUCOSE 89 05/05/2020 1530   BUN 22 05/05/2020 1530   BUN 16 11/24/2019 1557   CREATININE 1.23 05/05/2020 1530   CALCIUM 9.4 05/05/2020 1530   GFRNONAA >60 11/26/2019 0523   GFRAA 96 11/24/2019 1557    BNP No results found for: BNP  ProBNP No results found for: PROBNP  Imaging: No results found.   Assessment & Plan:   No problem-specific Assessment & Plan notes found for this encounter.     Glenford Bayley, NP 05/24/2020

## 2020-06-02 ENCOUNTER — Ambulatory Visit (INDEPENDENT_AMBULATORY_CARE_PROVIDER_SITE_OTHER): Payer: Medicaid Other | Admitting: Cardiology

## 2020-06-02 ENCOUNTER — Encounter: Payer: Self-pay | Admitting: Cardiology

## 2020-06-02 ENCOUNTER — Other Ambulatory Visit: Payer: Self-pay

## 2020-06-02 VITALS — BP 112/60 | HR 66 | Ht 70.0 in | Wt 205.4 lb

## 2020-06-02 DIAGNOSIS — R0602 Shortness of breath: Secondary | ICD-10-CM

## 2020-06-02 DIAGNOSIS — R062 Wheezing: Secondary | ICD-10-CM | POA: Diagnosis not present

## 2020-06-02 DIAGNOSIS — I350 Nonrheumatic aortic (valve) stenosis: Secondary | ICD-10-CM | POA: Diagnosis not present

## 2020-06-02 DIAGNOSIS — D649 Anemia, unspecified: Secondary | ICD-10-CM

## 2020-06-02 DIAGNOSIS — E785 Hyperlipidemia, unspecified: Secondary | ICD-10-CM | POA: Diagnosis not present

## 2020-06-02 DIAGNOSIS — Z720 Tobacco use: Secondary | ICD-10-CM | POA: Diagnosis not present

## 2020-06-02 DIAGNOSIS — E0800 Diabetes mellitus due to underlying condition with hyperosmolarity without nonketotic hyperglycemic-hyperosmolar coma (NKHHC): Secondary | ICD-10-CM | POA: Diagnosis not present

## 2020-06-02 MED ORDER — RANOLAZINE ER 500 MG PO TB12: 500.0000 mg | ORAL_TABLET | Freq: Two times a day (BID) | ORAL | 3 refills | Status: DC

## 2020-06-02 MED ORDER — PANTOPRAZOLE SODIUM 40 MG PO TBEC: 40.0000 mg | DELAYED_RELEASE_TABLET | Freq: Every day | ORAL | 3 refills | Status: DC

## 2020-06-02 NOTE — Patient Instructions (Addendum)
Medication Instructions:  Your physician has recommended you make the following change in your medication:  START: Ranexa 500 mg twice daily START: Protonix 40 mg once daily *If you need a refill on your cardiac medications before your next appointment, please call your pharmacy*   Lab Work: Your physician recommends that you return for lab work: TODAY: BMET, Mag, CBC If you have labs (blood work) drawn today and your tests are completely normal, you will receive your results only by: Marland Kitchen MyChart Message (if you have MyChart) OR . A paper copy in the mail If you have any lab test that is abnormal or we need to change your treatment, we will call you to review the results.   Testing/Procedures: Your physician has requested that you have an echocardiogram. Echocardiography is a painless test that uses sound waves to create images of your heart. It provides your doctor with information about the size and shape of your heart and how well your heart's chambers and valves are working. This procedure takes approximately one hour. There are no restrictions for this procedure.   Follow-Up: At [facility], you and your health needs are our priority.  As part of our continuing mission to provide you with exceptional heart care, we have created designated Provider Care Teams.  These Care Teams include your primary Cardiologist (physician) and Advanced Practice Providers (APPs -  Physician Assistants and Nurse Practitioners) who all work together to provide you with the care you need, when you need it.  We recommend signing up for the patient portal called "MyChart".  Sign up information is provided on this After Visit Summary.  MyChart is used to connect with patients for Virtual Visits (Telemedicine).  Patients are able to view lab/test results, encounter notes, upcoming appointments, etc.  Non-urgent messages can be sent to your provider as well.   To learn more about what you can do with MyChart, go  to [email or web address].    Your next appointment:   2 week(s)  The format for your next appointment:   In Person  Provider:   Gypsy Balsam, MD or Thomasene Ripple, DO   Other Instructions   Echocardiogram An echocardiogram is a test that uses sound waves (ultrasound) to produce images of the heart. Images from an echocardiogram can provide important information about:  Heart size and shape.  The size and thickness and movement of your heart's walls.  Heart muscle function and strength.  Heart valve function or if you have stenosis. Stenosis is when the heart valves are too narrow.  If blood is flowing backward through the heart valves (regurgitation).  A tumor or infectious growth around the heart valves.  Areas of heart muscle that are not working well because of poor blood flow or injury from a heart attack.  Aneurysm detection. An aneurysm is a weak or damaged part of an artery wall. The wall bulges out from the normal force of blood pumping through the body. Tell a health care provider about:  Any allergies you have.  All medicines you are taking, including vitamins, herbs, eye drops, creams, and over-the-counter medicines.  Any blood disorders you have.  Any surgeries you have had.  Any medical conditions you have.  Whether you are pregnant or may be pregnant. What are the risks? Generally, this is a safe test. However, problems may occur, including an allergic reaction to dye (contrast) that may be used during the test. What happens before the test? No specific preparation is needed. You may  eat and drink normally. What happens during the test?  You will take off your clothes from the waist up and put on a hospital gown.  Electrodes or electrocardiogram (ECG)patches may be placed on your chest. The electrodes or patches are then connected to a device that monitors your heart rate and rhythm.  You will lie down on a table for an ultrasound exam. A gel  will be applied to your chest to help sound waves pass through your skin.  A handheld device, called a transducer, will be pressed against your chest and moved over your heart. The transducer produces sound waves that travel to your heart and bounce back (or "echo" back) to the transducer. These sound waves will be captured in real-time and changed into images of your heart that can be viewed on a video monitor. The images will be recorded on a computer and reviewed by your health care provider.  You may be asked to change positions or hold your breath for a short time. This makes it easier to get different views or better views of your heart.  In some cases, you may receive contrast through an IV in one of your veins. This can improve the quality of the pictures from your heart. The procedure may vary among health care providers and hospitals.   What can I expect after the test? You may return to your normal, everyday life, including diet, activities, and medicines, unless your health care provider tells you not to do that. Follow these instructions at home:  It is up to you to get the results of your test. Ask your health care provider, or the department that is doing the test, when your results will be ready.  Keep all follow-up visits. This is important. Summary  An echocardiogram is a test that uses sound waves (ultrasound) to produce images of the heart.  Images from an echocardiogram can provide important information about the size and shape of your heart, heart muscle function, heart valve function, and other possible heart problems.  You do not need to do anything to prepare before this test. You may eat and drink normally.  After the echocardiogram is completed, you may return to your normal, everyday life, unless your health care provider tells you not to do that. This information is not intended to replace advice given to you by your health care provider. Make sure you discuss any  questions you have with your health care provider. Document Revised: 09/15/2019 Document Reviewed: 09/15/2019 Elsevier Patient Education  2021 [employer].

## 2020-06-02 NOTE — Progress Notes (Signed)
Cardiology Office Note:    Date:  06/02/2020   ID:  Vincent Guerrero, DOB Aug 17, 1960, MRN [id number]  PCP:  Simone Curia, MD  Cardiologist:  Thomasene Ripple, DO  Electrophysiologist:  None   Referring MD: Simone Curia, MD   I am feeling short of breath and has some chest pain recently.  History of Present Illness:    Vincent Guerrero is a 60 y.o. male with a hx of coronary artery disease status post stentin the RCAa result of an MI in 2010,hyperlipidemia (not on statins due to statin intolerance), diabetes mellitus (on Metformin and glipizide),, current smokerCVA in 2011, moderate aortic stenosis in April 2018.  In October the patient was short of breath were concerned that this may have been coronary artery disease therefore I set the patient up for a left heart catheterization but unfortunately his hemoglobin was significantly low.    He was seen by with GI he has had a colonoscopy which showed some ulcers.    When I saw the patient in March 2022 he had stopped his Repatha as well as his Imdur.  He felt both of these medication was playing with his mind.  I therefore placed the patient on Ranexa to help with any angina symptoms.  He had some wheezing that they and I referred the patient to pulmonary as he was not treated for likely undiagnosed COPD given his long smoking history.  His blood pressure was also elevated that day when asked the patient said he had not taken his blood pressure medication.  Today the patient was sent from his primary care doctor office to my office to be seen because of shortness of breath and chest tightness.  Past Medical History:  Diagnosis Date  . Age-related nuclear cataract of both eyes 11/27/2018  . Anemia   . Bilateral recurrent inguinal hernia without obstruction or gangrene 05/09/2016   Added automatically from request for surgery [id number]  . Chest pressure 11/06/2019  . Coronary artery disease involving native artery of transplanted heart  without angina pectoris   . Cortical age-related cataract of both eyes 11/27/2018  . Depression   . Diabetes mellitus (HCC)   . Hyperlipidemia LDL goal <70   . Hypertension   . Inguinal hernia without obstruction or gangrene    BILATERAL  . Primary open angle glaucoma (POAG) of both eyes, severe stage 11/27/2018  . Primary open angle glaucoma (POAG) of left eye, severe stage 11/27/2018  . Tobacco abuse 11/05/2019  . Tobacco use 11/06/2019    Past Surgical History:  Procedure Laterality Date  . BIOPSY  11/27/2019   Procedure: BIOPSY;  Surgeon: Sherrilyn Rist, MD;  Location: [facility] ENDOSCOPY;  Service: Gastroenterology;;  . COLONOSCOPY WITH PROPOFOL N/A 11/27/2019   Procedure: COLONOSCOPY WITH PROPOFOL;  Surgeon: Sherrilyn Rist, MD;  Location: [facility] ENDOSCOPY;  Service: Gastroenterology;  Laterality: N/A;  . ESOPHAGOGASTRODUODENOSCOPY (EGD) WITH PROPOFOL N/A 11/27/2019   Procedure: ESOPHAGOGASTRODUODENOSCOPY (EGD) WITH PROPOFOL;  Surgeon: Sherrilyn Rist, MD;  Location: [facility] ENDOSCOPY;  Service: Gastroenterology;  Laterality: N/A;  . HEMOSTASIS CLIP PLACEMENT  11/27/2019   Procedure: HEMOSTASIS CLIP PLACEMENT;  Surgeon: Sherrilyn Rist, MD;  Location: [facility] ENDOSCOPY;  Service: Gastroenterology;;  . HERNIA REPAIR  1998  . LEFT HEART CATH AND CORONARY ANGIOGRAPHY N/A 06/04/2016   Procedure: Left Heart Cath and Coronary Angiography;  Surgeon: Lyn Records, MD;  Location: [facility] INVASIVE CV LAB;  Service: Cardiovascular;  Laterality: N/A;    Current  Medications: Current Meds  Medication Sig  . acetaminophen (TYLENOL) 325 MG tablet Take 2 tablets (650 mg total) by mouth every 6 (six) hours as needed for mild pain (or Fever >/= 101).  Marland Kitchen glipiZIDE (GLUCOTROL) 10 MG tablet Take 10 mg by mouth 2 (two) times daily before a meal.   . lisinopril (PRINIVIL,ZESTRIL) 20 MG tablet Take 1 tablet (20 mg total) by mouth 2 (two) times daily.  . metFORMIN (GLUCOPHAGE) 1000 MG tablet Take 1,000 mg by mouth 2  (two) times daily with a meal.  . metoprolol (LOPRESSOR) 50 MG tablet Take 1 tablet (50 mg total) by mouth 2 (two) times daily.  . Multiple Vitamins-Minerals (CENTRUM SILVER 50+MEN) TABS Take 1 tablet by mouth daily with breakfast.  . Netarsudil-Latanoprost (ROCKLATAN) 0.02-0.005 % SOLN Place 1 drop into the right eye at bedtime.   Marland Kitchen POTASSIUM PO Take 1 tablet by mouth 3 (three) times a week.  . Tiotropium Bromide Monohydrate (SPIRIVA RESPIMAT) 2.5 MCG/ACT AERS Inhale 2 puffs into the lungs daily.  . [DISCONTINUED] ranolazine (RANEXA) 500 MG 12 hr tablet Take 1 tablet (500 mg total) by mouth 2 (two) times daily.     Allergies:   Patient has no known allergies.   Social History   Socioeconomic History  . Marital status: Legally Separated    Spouse name: Not on file  . Number of children: Not on file  . Years of education: Not on file  . Highest education level: Not on file  Occupational History  . Not on file  Tobacco Use  . Smoking status: Current Every Day Smoker    Packs/day: 1.00    Years: 40.00    Pack years: 40.00    Types: Cigarettes  . Smokeless tobacco: Current User    Types: Chew  . Tobacco comment: half pack daily-05/05/2020-AH  Vaping Use  . Vaping Use: Never used  Substance and Sexual Activity  . Alcohol use: No  . Drug use: Yes    Types: Marijuana  . Sexual activity: Yes  Other Topics Concern  . Not on file  Social History Narrative  . Not on file   Social Determinants of Health   Financial Resource Strain: Not on file  Food Insecurity: Not on file  Transportation Needs: Not on file  Physical Activity: Not on file  Stress: Not on file  Social Connections: Not on file     Family History: The patient's family history includes CAD in his father; Diabetes in his father; Heart failure in his mother; Hypertension in his mother; Prostate cancer in his brother.  ROS:   Review of Systems  Constitution: Negative for decreased appetite, fever and weight  gain.  HENT: Negative for congestion, ear discharge, hoarse voice and sore throat.   Eyes: Negative for discharge, redness, vision loss in right eye and visual halos.  Cardiovascular: Reports chest tightness and shortness of breath.  Negative for leg swelling, orthopnea and palpitations.  Respiratory: Negative for cough, hemoptysis, shortness of breath and snoring.   Endocrine: Negative for heat intolerance and polyphagia.  Hematologic/Lymphatic: Negative for bleeding problem. Does not bruise/bleed easily.  Skin: Negative for flushing, nail changes, rash and suspicious lesions.  Musculoskeletal: Negative for arthritis, joint pain, muscle cramps, myalgias, neck pain and stiffness.  Gastrointestinal: Negative for abdominal pain, bowel incontinence, diarrhea and excessive appetite.  Genitourinary: Negative for decreased libido, genital sores and incomplete emptying.  Neurological: Negative for brief paralysis, focal weakness, headaches and loss of balance.  Psychiatric/Behavioral: Negative for altered  mental status, depression and suicidal ideas.  Allergic/Immunologic: Negative for HIV exposure and persistent infections.    EKGs/Labs/Other Studies Reviewed:    The following studies were reviewed today:   EKG:  The ekg ordered today demonstrates sinus rhythm heart rate 62 bpm with ST segment changes suggesting inferior wall infarction , with anterolateral ischemia.  Compared to prior EKG no significant change.  Echocardiogram September 2021 IMPRESSIONS    1. Left ventricular ejection fraction, by estimation, is 60 to 65%. The  left ventricle has normal function. The left ventricle has no regional  wall motion abnormalities. There is moderate left ventricular hypertrophy.  Left ventricular diastolic  parameters are consistent with Grade I diastolic dysfunction (impaired  relaxation).  2. Right ventricular systolic function is normal. The right ventricular  size is normal. There is  normal pulmonary artery systolic pressure.  3. The mitral valve is normal in structure. No evidence of mitral valve  regurgitation. No evidence of mitral stenosis.  4. The aortic valve is normal in structure. Aortic valve regurgitation is  moderate. Moderate aortic valve stenosis.  5. The inferior vena cava is normal in size with greater than 50%  respiratory variability, suggesting right atrial pressure of 3 mmHg.   FINDINGS  Left Ventricle: Left ventricular ejection fraction, by estimation, is 60  to 65%. The left ventricle has normal function. The left ventricle has no  regional wall motion abnormalities. The left ventricular internal cavity  size was normal in size. There is  moderate left ventricular hypertrophy. Left ventricular diastolic  parameters are consistent with Grade I diastolic dysfunction (impaired  relaxation).   Right Ventricle: The right ventricular size is normal. No increase in  right ventricular wall thickness. Right ventricular systolic function is  normal. There is normal pulmonary artery systolic pressure. The tricuspid  regurgitant velocity is 2.17 m/s, and  with an assumed right atrial pressure of 3 mmHg, the estimated right  ventricular systolic pressure is 21.8 mmHg.   Left Atrium: Left atrial size was normal in size.   Right Atrium: Right atrial size was normal in size.   Pericardium: There is no evidence of pericardial effusion.   Mitral Valve: The mitral valve is normal in structure. No evidence of  mitral valve regurgitation. No evidence of mitral valve stenosis.   Tricuspid Valve: The tricuspid valve is normal in structure. Tricuspid  valve regurgitation is mild . No evidence of tricuspid stenosis.   Aortic Valve: The aortic valve is normal in structure. Aortic valve  regurgitation is moderate. Aortic regurgitation PHT measures 436 msec.  Moderate aortic stenosis is present. Aortic valve mean gradient measures  38.0 mmHg. Aortic valve  peak gradient  measures 70.6 mmHg. Aortic valve area, by VTI measures 1.27 cm.   Pulmonic Valve: The pulmonic valve was normal in structure. Pulmonic valve  regurgitation is not visualized. No evidence of pulmonic stenosis.   Aorta: The aortic root is normal in size and structure.   Venous: The inferior vena cava is normal in size with greater than 50%  respiratory variability, suggesting right atrial pressure of 3 mmHg.   IAS/Shunts: No atrial level shunt detected by color flow Doppler.   Recent Labs: 11/24/2019: Magnesium 1.5 05/05/2020: ALT 9; BUN 22; Creatinine, Ser 1.23; Hemoglobin 8.9 Repeated and verified X2.; Platelets 239.0; Potassium 4.9; Sodium 140  Recent Lipid Panel    Component Value Date/Time   CHOL 82 11/27/2019 1004   CHOL 172 12/10/2018 1058   TRIG 60 11/27/2019 1004   HDL  37 (L) 11/27/2019 1004   HDL 43 12/10/2018 1058   CHOLHDL 2.2 11/27/2019 1004   VLDL 12 11/27/2019 1004   LDLCALC 33 11/27/2019 1004   LDLCALC 100 (H) 12/10/2018 1058    Physical Exam:    VS:  BP 112/60   Pulse 66   Ht 5\' 10"  (1.778 m)   Wt 205 lb 6.4 oz (93.2 kg)   SpO2 98%   BMI 29.47 kg/m     Wt Readings from Last 3 Encounters:  06/02/20 205 lb 6.4 oz (93.2 kg)  05/05/20 206 lb 6.4 oz (93.6 kg)  04/15/20 210 lb 3.2 oz (95.3 kg)     GEN: Well nourished, well developed in no acute distress HEENT: Normal NECK: No JVD; No carotid bruits LYMPHATICS: No lymphadenopathy CARDIAC: S1S2 noted,RRR, no murmurs, rubs, gallops RESPIRATORY: Diffuse wheezing  ABDOMEN: Soft, non-tender, non-distended, +bowel sounds, no guarding. EXTREMITIES: No edema, No cyanosis, no clubbing MUSCULOSKELETAL:  No deformity  SKIN: Warm and dry NEUROLOGIC:  Alert and oriented x 3, non-focal PSYCHIATRIC:  Normal affect, good insight  ASSESSMENT:    1. Shortness of breath   2. Diabetes mellitus due to underlying condition with hyperosmolarity without coma, without long-term current use of insulin (HCC)    3. Hyperlipidemia LDL goal <70   4. Tobacco use   5. Symptomatic anemia   6. Wheezing    PLAN:    So he is short of breath he has had some chest tightness.  The patient is wheezing in the office of asked him about taking his inhalers he said he is missed a couple of days.  He has not seen pulmonary as I referred him to.  He is still seeing his primary care doctor for this.  Recently he had blood work which showed hemoglobin of 8.93 weeks ago I am suspecting that his hemoglobin may be dropping.  He stopped his Protonix.  06/15/20 started patient back on his Protonix 40 mg daily.  He stopped his Ranexa, he said that he could not tolerate the Imdur because it was playing with his mind.  He is not sure why he stopped the Ranexa.  We will going to restart this.  The problem with this patient is he does have known coronary artery disease as well as risk factors for progression.  He has significant anemia and we have to understand more about this if his hemoglobin drops again which is very limiting for sending the patient for left heart catheterization.  I like to optimize his medical management while we get information on his hemoglobin and if he needs to see GI and follow-up with him in the meantime.  He has a history of moderate aortic stenosis I am going to repeat an echocardiogram to make sure there is no significant progression of his aortic stenosis.  He still smokes.  I talked to him about smoking cessation and the detrimental effects to his heart given his coronary artery disease.   His diabetes mellitus is being managed by his primary care doctor.  He had not been able to tolerate statins we put him on Repatha and the patient actually stopped this medication and said he is not tolerating it.  Overall this is complicated because I think the patient is not really complying with our medical recommendations in terms of his cardiovascular health.  He is here today with his fiance which I explained  to both of them the importance of this.  But for right now we will repeat  his hemoglobin, he needs to continue to take his medication as prescribed, as well as he needs to continue with his inhalers and pursue the follow-up with pulmonary.  Plan to see the patient in 2 weeks.  Advised the patient if his symptoms get worse while he is restarting his medication to please call 911 so he can be taken to the nearest emergency department.  He expresses understanding.  The patient is in agreement with the above plan. The patient left the office in stable condition.  The patient will follow up in 2 weeks.   Medication Adjustments/Labs and Tests Ordered: Current medicines are reviewed at length with the patient today.  Concerns regarding medicines are outlined above.  No orders of the defined types were placed in this encounter.  No orders of the defined types were placed in this encounter.   Patient Instructions  Medication Instructions:  Your physician has recommended you make the following change in your medication:  START: Ranexa 500 mg twice daily START: Protinix 40 mg once daily *If you need a refill on your cardiac medications before your next appointment, please call your pharmacy*   Lab Work: Your physician recommends that you return for lab work: TODAY: BMET, Mag, CBC If you have labs (blood work) drawn today and your tests are completely normal, you will receive your results only by: Marland Kitchen. MyChart Message (if you have MyChart) OR . A paper copy in the mail If you have any lab test that is abnormal or we need to change your treatment, we will call you to review the results.   Testing/Procedures: Your physician has requested that you have an echocardiogram. Echocardiography is a painless test that uses sound waves to create images of your heart. It provides your doctor with information about the size and shape of your heart and how well your heart's chambers and valves are working. This  procedure takes approximately one hour. There are no restrictions for this procedure.   Follow-Up: At [facility], you and your health needs are our priority.  As part of our continuing mission to provide you with exceptional heart care, we have created designated Provider Care Teams.  These Care Teams include your primary Cardiologist (physician) and Advanced Practice Providers (APPs -  Physician Assistants and Nurse Practitioners) who all work together to provide you with the care you need, when you need it.  We recommend signing up for the patient portal called "MyChart".  Sign up information is provided on this After Visit Summary.  MyChart is used to connect with patients for Virtual Visits (Telemedicine).  Patients are able to view lab/test results, encounter notes, upcoming appointments, etc.  Non-urgent messages can be sent to your provider as well.   To learn more about what you can do with MyChart, go to [email or web address].    Your next appointment:   2 week(s)  The format for your next appointment:   In Person  Provider:   Gypsy Balsamobert Krasowski, MD or Thomasene RippleKardie Amalie Koran, DO   Other Instructions      Adopting a Healthy Lifestyle.  Know what a healthy weight is for you (roughly BMI <25) and aim to maintain this   Aim for 7+ servings of fruits and vegetables daily   65-80+ fluid ounces of water or unsweet tea for healthy kidneys   Limit to max 1 drink of alcohol per day; avoid smoking/tobacco   Limit animal fats in diet for cholesterol and heart health - choose grass fed whenever available  Avoid highly processed foods, and foods high in saturated/trans fats   Aim for low stress - take time to unwind and care for your mental health   Aim for 150 min of moderate intensity exercise weekly for heart health, and weights twice weekly for bone health   Aim for 7-9 hours of sleep daily   When it comes to diets, agreement about the perfect plan isnt easy to find, even among  the experts. Experts at the [facility] of [employer] developed an idea known as the Healthy Eating Plate. Just imagine a plate divided into logical, healthy portions.   The emphasis is on diet quality:   Load up on vegetables and fruits - one-half of your plate: Aim for color and variety, and remember that potatoes dont count.   Go for whole grains - one-quarter of your plate: Whole wheat, barley, wheat berries, quinoa, oats, brown rice, and foods made with them. If you want pasta, go with whole wheat pasta.   Protein power - one-quarter of your plate: Fish, chicken, beans, and nuts are all healthy, versatile protein sources. Limit red meat.   The diet, however, does go beyond the plate, offering a few other suggestions.   Use healthy plant oils, such as olive, canola, soy, corn, sunflower and peanut. Check the labels, and avoid partially hydrogenated oil, which have unhealthy trans fats.   If youre thirsty, drink water. Coffee and tea are good in moderation, but skip sugary drinks and limit milk and dairy products to one or two daily servings.   The type of carbohydrate in the diet is more important than the amount. Some sources of carbohydrates, such as vegetables, fruits, whole grains, and beans-are healthier than others.   Finally, stay active  Signed, Thomasene Ripple, DO  06/02/2020 4:34 PM    [facility]

## 2020-06-03 LAB — CBC WITH DIFFERENTIAL/PLATELET
Basophils Absolute: 0 10*3/uL (ref 0.0–0.2)
Basos: 0 %
EOS (ABSOLUTE): 0.1 10*3/uL (ref 0.0–0.4)
Eos: 1 %
Hematocrit: 27.3 % — ABNORMAL LOW (ref 37.5–51.0)
Hemoglobin: 8 g/dL — ABNORMAL LOW (ref 13.0–17.7)
Immature Grans (Abs): 0 10*3/uL (ref 0.0–0.1)
Immature Granulocytes: 0 %
Lymphocytes Absolute: 3.6 10*3/uL — ABNORMAL HIGH (ref 0.7–3.1)
Lymphs: 38 %
MCH: 19.3 pg — ABNORMAL LOW (ref 26.6–33.0)
MCHC: 29.3 g/dL — ABNORMAL LOW (ref 31.5–35.7)
MCV: 66 fL — ABNORMAL LOW (ref 79–97)
Monocytes Absolute: 1.1 10*3/uL — ABNORMAL HIGH (ref 0.1–0.9)
Monocytes: 11 %
Neutrophils Absolute: 4.6 10*3/uL (ref 1.4–7.0)
Neutrophils: 50 %
Platelets: 257 10*3/uL (ref 150–450)
RBC: 4.14 x10E6/uL (ref 4.14–5.80)
RDW: 19.2 % — ABNORMAL HIGH (ref 11.6–15.4)
WBC: 9.5 10*3/uL (ref 3.4–10.8)

## 2020-06-03 LAB — BASIC METABOLIC PANEL
BUN/Creatinine Ratio: 18 (ref 9–20)
BUN: 19 mg/dL (ref 6–24)
CO2: 22 mmol/L (ref 20–29)
Calcium: 9.1 mg/dL (ref 8.7–10.2)
Chloride: 105 mmol/L (ref 96–106)
Creatinine, Ser: 1.04 mg/dL (ref 0.76–1.27)
Glucose: 94 mg/dL (ref 65–99)
Potassium: 5 mmol/L (ref 3.5–5.2)
Sodium: 139 mmol/L (ref 134–144)
eGFR: 83 mL/min/{1.73_m2} (ref >59)

## 2020-06-03 LAB — MAGNESIUM: Magnesium: 1.7 mg/dL (ref 1.6–2.3)

## 2020-06-07 ENCOUNTER — Ambulatory Visit (INDEPENDENT_AMBULATORY_CARE_PROVIDER_SITE_OTHER): Payer: Medicaid Other

## 2020-06-07 ENCOUNTER — Other Ambulatory Visit: Payer: Self-pay

## 2020-06-07 DIAGNOSIS — R0602 Shortness of breath: Secondary | ICD-10-CM | POA: Diagnosis not present

## 2020-06-07 DIAGNOSIS — R062 Wheezing: Secondary | ICD-10-CM

## 2020-06-07 DIAGNOSIS — I350 Nonrheumatic aortic (valve) stenosis: Secondary | ICD-10-CM

## 2020-06-07 LAB — ECHOCARDIOGRAM COMPLETE
AR max vel: 1.25 cm2
AV Area VTI: 1.25 cm2
AV Area mean vel: 1.09 cm2
AV Mean grad: 43 mmHg
AV Peak grad: 70.6 mmHg
Ao pk vel: 4.2 m/s
Area-P 1/2: 2.54 cm2
P 1/2 time: 609 msec
S' Lateral: 2.4 cm

## 2020-06-07 NOTE — Progress Notes (Signed)
Complete echocardiogram performed.  Jimmy Blakelyn Dinges RDCS, RVT  

## 2020-06-10 ENCOUNTER — Other Ambulatory Visit: Payer: Self-pay

## 2020-06-10 ENCOUNTER — Telehealth: Payer: Self-pay | Admitting: Emergency Medicine

## 2020-06-10 ENCOUNTER — Encounter (HOSPITAL_COMMUNITY): Payer: Self-pay | Admitting: Emergency Medicine

## 2020-06-10 ENCOUNTER — Emergency Department (HOSPITAL_COMMUNITY): Payer: Medicaid Other

## 2020-06-10 ENCOUNTER — Emergency Department (HOSPITAL_COMMUNITY)
Admission: EM | Admit: 2020-06-10 | Discharge: 2020-06-11 | Disposition: A | Discharge status: Home / Self Care | Payer: Medicaid Other | Attending: Emergency Medicine | Admitting: Emergency Medicine

## 2020-06-10 DIAGNOSIS — F17210 Nicotine dependence, cigarettes, uncomplicated: Secondary | ICD-10-CM | POA: Insufficient documentation

## 2020-06-10 DIAGNOSIS — Z7984 Long term (current) use of oral hypoglycemic drugs: Secondary | ICD-10-CM | POA: Insufficient documentation

## 2020-06-10 DIAGNOSIS — I10 Essential (primary) hypertension: Secondary | ICD-10-CM | POA: Insufficient documentation

## 2020-06-10 DIAGNOSIS — E119 Type 2 diabetes mellitus without complications: Secondary | ICD-10-CM | POA: Insufficient documentation

## 2020-06-10 DIAGNOSIS — I25118 Atherosclerotic heart disease of native coronary artery with other forms of angina pectoris: Secondary | ICD-10-CM | POA: Insufficient documentation

## 2020-06-10 DIAGNOSIS — Z79899 Other long term (current) drug therapy: Secondary | ICD-10-CM | POA: Insufficient documentation

## 2020-06-10 DIAGNOSIS — R072 Precordial pain: Secondary | ICD-10-CM | POA: Insufficient documentation

## 2020-06-10 DIAGNOSIS — R0789 Other chest pain: Secondary | ICD-10-CM

## 2020-06-10 MED ORDER — ASPIRIN 81 MG PO CHEW
324.0000 mg | CHEWABLE_TABLET | Freq: Once | ORAL | Status: AC
  Administered 2020-06-11: 324 mg via ORAL
  Filled 2020-06-10: qty 4

## 2020-06-10 NOTE — Telephone Encounter (Signed)
Patient came in office this evening complaining of chest pain he had last night. Patient is not having chest pain right now. Patient asking for nitro. Called DOD Dr. Bing Matter he advised not to give nitro due to aortic stenosis diagnosis on his chart. Patient aware we cannot give him nitro for this reason. Spoke further with Dr. Servando Salina as this is her patient and she advised that the patient was advised by our office to go to the emergency room on 06/03/20 due to low hemoglobin he went but left AMA. Per Dr. Servando Salina he is having symptomatic anemia as it still wasn't addressed since he left AMA. Patient has been advised by Dr. Servando Salina to see GI as well for this issue and it doesn't seem he is following up with them. Advised patient it is imperative that he go back to the emergency room to get this addressed. Explained to him the hemoglobin issue must be addressed before he can have any procedures. He is hesitant and states he would go to the emergency room Monday I advised him no that he needs to go now. Offered to call family to take him or a ambulance he declined. He states he understood to go to the emergency room as he left. Dr. Servando Salina aware.

## 2020-06-10 NOTE — ED Provider Notes (Signed)
Emergency Medicine Provider Triage Evaluation Note  Vincent Guerrero , a 60 y.o. male  was evaluated in triage.  Pt complains of chest pain.  He has a history of CAD, prior stent.  He has had pain for some time, but last night without clear precipitant had acute worsening.  Since that time he has had fatigue, but the pain itself has improved substantially.  Review of Systems  Positive: As above, chest pain Negative: No dyspnea  Physical Exam  BP (!) 152/68 (BP Location: Left Arm)   Pulse 90   Temp 98.1 F (36.7 C) (Oral)   Resp 16   Ht 5\' 10"  (1.778 m)   Wt 93 kg   SpO2 98%   BMI 29.41 kg/m  Gen:   Awake, no distress   Resp:  Normal effort  MSK:   Moves extremities without difficulty   Medical Decision Making  Medically screening exam initiated at 10:59 PM.  Appropriate orders placed.  was informed that the remainder of the evaluation will be completed by another provider, this initial triage assessment is simply the beginning of his evaluation   [name], MD 06/10/20 2301

## 2020-06-10 NOTE — ED Triage Notes (Signed)
Patient reports intermittent central chest pain with SOB and occasional dry cough onset last night , no emesis or diaphoresis , history of CAD/coronary stent .

## 2020-06-11 ENCOUNTER — Other Ambulatory Visit: Payer: Self-pay

## 2020-06-11 LAB — COMPREHENSIVE METABOLIC PANEL
ALT: 12 U/L (ref 0–44)
AST: 21 U/L (ref 15–41)
Albumin: 3.9 g/dL (ref 3.5–5.0)
Alkaline Phosphatase: 54 U/L (ref 38–126)
Anion gap: 9 (ref 5–15)
BUN: 26 mg/dL — ABNORMAL HIGH (ref 6–20)
CO2: 23 mmol/L (ref 22–32)
Calcium: 8.9 mg/dL (ref 8.9–10.3)
Chloride: 104 mmol/L (ref 98–111)
Creatinine, Ser: 1.43 mg/dL — ABNORMAL HIGH (ref 0.61–1.24)
GFR, Estimated: 56 mL/min — ABNORMAL LOW (ref >60)
Glucose, Bld: 104 mg/dL — ABNORMAL HIGH (ref 70–99)
Potassium: 4.4 mmol/L (ref 3.5–5.1)
Sodium: 136 mmol/L (ref 135–145)
Total Bilirubin: 0.4 mg/dL (ref 0.3–1.2)
Total Protein: 6.7 g/dL (ref 6.5–8.1)

## 2020-06-11 LAB — CBC WITH DIFFERENTIAL/PLATELET
Abs Immature Granulocytes: 0.04 10*3/uL (ref 0.00–0.07)
Basophils Absolute: 0 10*3/uL (ref 0.0–0.1)
Basophils Relative: 0 %
Eosinophils Absolute: 0.1 10*3/uL (ref 0.0–0.5)
Eosinophils Relative: 1 %
HCT: 28 % — ABNORMAL LOW (ref 39.0–52.0)
Hemoglobin: 7.9 g/dL — ABNORMAL LOW (ref 13.0–17.0)
Immature Granulocytes: 0 %
Lymphocytes Relative: 29 %
Lymphs Abs: 3.7 10*3/uL (ref 0.7–4.0)
MCH: 18.9 pg — ABNORMAL LOW (ref 26.0–34.0)
MCHC: 28.2 g/dL — ABNORMAL LOW (ref 30.0–36.0)
MCV: 67.1 fL — ABNORMAL LOW (ref 80.0–100.0)
Monocytes Absolute: 1.4 10*3/uL — ABNORMAL HIGH (ref 0.1–1.0)
Monocytes Relative: 11 %
Neutro Abs: 7.5 10*3/uL (ref 1.7–7.7)
Neutrophils Relative %: 59 %
Platelets: 430 10*3/uL — ABNORMAL HIGH (ref 150–400)
RBC: 4.17 MIL/uL — ABNORMAL LOW (ref 4.22–5.81)
RDW: 20.5 % — ABNORMAL HIGH (ref 11.5–15.5)
WBC: 12.7 10*3/uL — ABNORMAL HIGH (ref 4.0–10.5)
nRBC: 0.2 % (ref 0.0–0.2)

## 2020-06-11 LAB — TROPONIN I (HIGH SENSITIVITY)
Troponin I (High Sensitivity): 16 ng/L (ref <18)
Troponin I (High Sensitivity): 15 ng/L (ref <18)

## 2020-06-11 LAB — PROTIME-INR
INR: 0.9 (ref 0.8–1.2)
Prothrombin Time: 12.6 seconds (ref 11.4–15.2)

## 2020-06-11 LAB — CBG MONITORING, ED: Glucose-Capillary: 126 mg/dL — ABNORMAL HIGH (ref 70–99)

## 2020-06-11 LAB — MAGNESIUM: Magnesium: 1.7 mg/dL (ref 1.7–2.4)

## 2020-06-11 LAB — BRAIN NATRIURETIC PEPTIDE: B Natriuretic Peptide: 53 pg/mL (ref 0.0–100.0)

## 2020-06-11 MED ORDER — FERROUS SULFATE 325 (65 FE) MG PO TABS: 325.0000 mg | ORAL_TABLET | Freq: Every day | ORAL | 0 refills | Status: DC

## 2020-06-11 NOTE — ED Notes (Signed)
Pt discharged and ambulated out of the ED without difficulty. 

## 2020-06-11 NOTE — ED Provider Notes (Signed)
[facility] EMERGENCY DEPARTMENT Provider Note   CSN: [id number] Arrival date & time: 06/10/20  2246     History Chief Complaint  Patient presents with  . Chest Pain    Vincent Guerrero is a 60 y.o. male.   Chest Pain Pain location:  Substernal area Pain quality: aching   Pain radiates to:  Does not radiate Pain severity:  Mild Onset quality:  Gradual Timing:  Intermittent Progression:  Unchanged Chronicity:  Recurrent Context: not breathing   Relieved by:  Nothing Worsened by:  Nothing Ineffective treatments:  None tried Associated symptoms: no abdominal pain, no fatigue, no fever, no orthopnea and no shortness of breath        Past Medical History:  Diagnosis Date  . Age-related nuclear cataract of both eyes 11/27/2018  . Anemia   . Bilateral recurrent inguinal hernia without obstruction or gangrene 05/09/2016   Added automatically from request for surgery [id number]  . Chest pressure 11/06/2019  . Coronary artery disease involving native artery of transplanted heart without angina pectoris   . Cortical age-related cataract of both eyes 11/27/2018  . Depression   . Diabetes mellitus (HCC)   . Hyperlipidemia LDL goal <70   . Hypertension   . Inguinal hernia without obstruction or gangrene    BILATERAL  . Primary open angle glaucoma (POAG) of both eyes, severe stage 11/27/2018  . Primary open angle glaucoma (POAG) of left eye, severe stage 11/27/2018  . Tobacco abuse 11/05/2019  . Tobacco use 11/06/2019    Patient Active Problem List   Diagnosis Date Noted  . Shortness of breath 06/02/2020  . Wheezing 06/02/2020  . Coronary artery disease involving native artery of transplanted heart without angina pectoris   . Iron deficiency anemia due to chronic blood loss 11/27/2019  . Symptomatic anemia 11/25/2019  . Chest pressure 11/06/2019  . Tobacco use 11/06/2019  . Tobacco abuse 11/05/2019  . Anemia   . Inguinal hernia without obstruction or  gangrene   . Hypertension   . Diabetes mellitus (HCC)   . Depression   . Age-related nuclear cataract of both eyes 11/27/2018  . Cortical age-related cataract of both eyes 11/27/2018  . Primary open angle glaucoma (POAG) of left eye, severe stage 11/27/2018  . Primary open angle glaucoma (POAG) of both eyes, severe stage 11/27/2018  . Coronary artery disease involving native heart   . Hyperlipidemia LDL goal <70   . Bilateral recurrent inguinal hernia without obstruction or gangrene 05/09/2016    Past Surgical History:  Procedure Laterality Date  . BIOPSY  11/27/2019   Procedure: BIOPSY;  Surgeon: Sherrilyn Rist, MD;  Location: [facility] ENDOSCOPY;  Service: Gastroenterology;;  . COLONOSCOPY WITH PROPOFOL N/A 11/27/2019   Procedure: COLONOSCOPY WITH PROPOFOL;  Surgeon: Sherrilyn Rist, MD;  Location: [facility] ENDOSCOPY;  Service: Gastroenterology;  Laterality: N/A;  . ESOPHAGOGASTRODUODENOSCOPY (EGD) WITH PROPOFOL N/A 11/27/2019   Procedure: ESOPHAGOGASTRODUODENOSCOPY (EGD) WITH PROPOFOL;  Surgeon: Sherrilyn Rist, MD;  Location: [facility] ENDOSCOPY;  Service: Gastroenterology;  Laterality: N/A;  . HEMOSTASIS CLIP PLACEMENT  11/27/2019   Procedure: HEMOSTASIS CLIP PLACEMENT;  Surgeon: Sherrilyn Rist, MD;  Location: [facility] ENDOSCOPY;  Service: Gastroenterology;;  . HERNIA REPAIR  1998  . LEFT HEART CATH AND CORONARY ANGIOGRAPHY N/A 06/04/2016   Procedure: Left Heart Cath and Coronary Angiography;  Surgeon: Lyn Records, MD;  Location: [facility] INVASIVE CV LAB;  Service: Cardiovascular;  Laterality: N/A;       Family  History  Problem Relation Age of Onset  . Heart failure Mother   . Hypertension Mother   . CAD Father   . Diabetes Father   . Prostate cancer Brother     Social History   Tobacco Use  . Smoking status: Current Every Day Smoker    Packs/day: 1.00    Years: 40.00    Pack years: 40.00    Types: Cigarettes  . Smokeless tobacco: Current User    Types: Chew  . Tobacco comment:  half pack daily-05/05/2020-AH  Vaping Use  . Vaping Use: Never used  Substance Use Topics  . Alcohol use: No  . Drug use: Yes    Types: Marijuana    Home Medications Prior to Admission medications   Medication Sig Start Date End Date Taking? Authorizing Provider  acetaminophen (TYLENOL) 325 MG tablet Take 2 tablets (650 mg total) by mouth every 6 (six) hours as needed for mild pain (or Fever >/= 101). 11/28/19   Dolan Amen, MD  glipiZIDE (GLUCOTROL) 10 MG tablet Take 10 mg by mouth 2 (two) times daily before a meal.  11/19/18   [provider]  lisinopril (PRINIVIL,ZESTRIL) 20 MG tablet Take 1 tablet (20 mg total) by mouth 2 (two) times daily. 05/11/16   Janetta Hora, PA-C  metFORMIN (GLUCOPHAGE) 1000 MG tablet Take 1,000 mg by mouth 2 (two) times daily with a meal.    [provider]  metoprolol (LOPRESSOR) 50 MG tablet Take 1 tablet (50 mg total) by mouth 2 (two) times daily. 05/11/16   Janetta Hora, PA-C  Multiple Vitamins-Minerals (CENTRUM SILVER 50+MEN) TABS Take 1 tablet by mouth daily with breakfast.    [provider]  Netarsudil-Latanoprost (ROCKLATAN) 0.02-0.005 % SOLN Place 1 drop into the right eye at bedtime.     [provider]  pantoprazole (PROTONIX) 40 MG tablet Take 1 tablet (40 mg total) by mouth daily. 06/02/20   Tobb, Kardie, DO  POTASSIUM PO Take 1 tablet by mouth 3 (three) times a week.    [provider]  ranolazine (RANEXA) 500 MG 12 hr tablet Take 1 tablet (500 mg total) by mouth 2 (two) times daily. 06/02/20   Tobb, Kardie, DO  Tiotropium Bromide Monohydrate (SPIRIVA RESPIMAT) 2.5 MCG/ACT AERS Inhale 2 puffs into the lungs daily. 05/05/20   Kalman Shan, MD    Allergies    Patient has no known allergies.  Review of Systems   Review of Systems  Constitutional: Negative for fatigue and fever.  Respiratory: Negative for shortness of breath.   Cardiovascular: Positive for chest pain. Negative for  orthopnea.  Gastrointestinal: Negative for abdominal pain.  All other systems reviewed and are negative.   Physical Exam Updated Vital Signs BP (!) 131/55   Pulse 63   Temp 98.1 F (36.7 C) (Oral)   Resp 19   Ht 5\' 10"  (1.778 m)   Wt 93 kg   SpO2 100%   BMI 29.41 kg/m   Physical Exam Vitals and nursing note reviewed.  Constitutional:      Appearance: He is well-developed.  HENT:     Head: Normocephalic and atraumatic.     Mouth/Throat:     Mouth: Mucous membranes are moist.     Pharynx: Oropharynx is clear.  Eyes:     Pupils: Pupils are equal, round, and reactive to light.  Cardiovascular:     Rate and Rhythm: Normal rate.  Pulmonary:     Effort: Pulmonary effort is normal. No respiratory  distress.  Abdominal:     General: There is no distension.     Palpations: Abdomen is soft.  Musculoskeletal:        General: Normal range of motion.     Cervical back: Normal range of motion.  Skin:    General: Skin is warm.  Neurological:     General: No focal deficit present.     Mental Status: He is alert.  Psychiatric:        Mood and Affect: Mood normal.     ED Results / Procedures / Treatments   Labs (all labs ordered are listed, but only abnormal results are displayed) Labs Reviewed  COMPREHENSIVE METABOLIC PANEL - Abnormal; Notable for the following components:      Result Value   Glucose, Bld 104 (*)    BUN 26 (*)    Creatinine, Ser 1.43 (*)    GFR, Estimated 56 (*)    All other components within normal limits  CBC WITH DIFFERENTIAL/PLATELET - Abnormal; Notable for the following components:   WBC 12.7 (*)    RBC 4.17 (*)    Hemoglobin 7.9 (*)    HCT 28.0 (*)    MCV 67.1 (*)    MCH 18.9 (*)    MCHC 28.2 (*)    RDW 20.5 (*)    Platelets 430 (*)    Monocytes Absolute 1.4 (*)    All other components within normal limits  MAGNESIUM  PROTIME-INR  BRAIN NATRIURETIC PEPTIDE  CBG MONITORING, ED  TROPONIN I (HIGH SENSITIVITY)     EKG None  Radiology DG Chest Portable 1 View  Result Date: 06/10/2020 CLINICAL DATA:  Shortness of breath and weakness. EXAM: PORTABLE CHEST 1 VIEW COMPARISON:  November 25, 2019 FINDINGS: The heart size and mediastinal contours are within normal limits. Both lungs are clear. The visualized skeletal structures are unremarkable. IMPRESSION: No active disease. Electronically Signed   By: Aram Candelahaddeus  Houston M.D.   On: 06/10/2020 23:38    Procedures Procedures   Medications Ordered in ED Medications  aspirin chewable tablet 324 mg (has no administration in time range)    ED Course  I have reviewed the triage vital signs and the nursing notes.  Pertinent labs & imaging results that were available during my care of the patient were reviewed by me and considered in my medical decision making (see chart for details).    MDM Rules/Calculators/A&P                          60 year old male with history of coronary disease status post stent presents emerged from today with chest pain.  It appears on reviewing the records at the patient has had some misunderstanding of different diagnoses.  Sounds like cardiology does not want to do another catheterization until his anemia is figured out however it was determined on his last hospitalization he likely has iron deficiency anemia.  He was never started on iron or he does not post to 1 of.  He has had a ED and colonoscopy which were basically unremarkable aside for some nonbleeding ulcers.  No dark stools no red stools no other hemorrhage.  We will go and start him on iron.  Errantly has ambulated chest pain today.  But hopefully this can help his hemoglobin get better so that cardiology can do to the catheterization if they want to for his stable angina.  Patient understands return here for any new or worsening symptoms.  No  other complaints at this time  Final Clinical Impression(s) / ED Diagnoses Final diagnoses:  Atypical chest pain    Rx / DC  Orders ED Discharge Orders    None       Taylin Mans, Barbara Cower, MD 06/11/20 [phone]

## 2020-06-16 ENCOUNTER — Ambulatory Visit: Payer: Medicaid Other | Admitting: Cardiology

## 2020-06-16 ENCOUNTER — Other Ambulatory Visit: Payer: Self-pay

## 2020-06-16 ENCOUNTER — Encounter: Payer: Self-pay | Admitting: Cardiology

## 2020-06-16 VITALS — BP 130/78 | HR 84 | Ht 70.0 in | Wt 203.0 lb

## 2020-06-16 DIAGNOSIS — I078 Other rheumatic tricuspid valve diseases: Secondary | ICD-10-CM

## 2020-06-16 DIAGNOSIS — I2510 Atherosclerotic heart disease of native coronary artery without angina pectoris: Secondary | ICD-10-CM | POA: Diagnosis not present

## 2020-06-16 DIAGNOSIS — D649 Anemia, unspecified: Secondary | ICD-10-CM

## 2020-06-16 DIAGNOSIS — R079 Chest pain, unspecified: Secondary | ICD-10-CM

## 2020-06-16 DIAGNOSIS — R0602 Shortness of breath: Secondary | ICD-10-CM | POA: Diagnosis not present

## 2020-06-16 DIAGNOSIS — I10 Essential (primary) hypertension: Secondary | ICD-10-CM

## 2020-06-16 DIAGNOSIS — E785 Hyperlipidemia, unspecified: Secondary | ICD-10-CM

## 2020-06-16 MED ORDER — RANOLAZINE ER 500 MG PO TB12: 500.0000 mg | ORAL_TABLET | Freq: Two times a day (BID) | ORAL | 3 refills | Status: DC

## 2020-06-16 MED ORDER — NITROGLYCERIN 0.4 MG SL SUBL: 0.4000 mg | SUBLINGUAL_TABLET | SUBLINGUAL | 3 refills | Status: DC | PRN

## 2020-06-16 MED ORDER — ISOSORBIDE MONONITRATE ER 30 MG PO TB24: 30.0000 mg | ORAL_TABLET | Freq: Every day | ORAL | 3 refills | Status: DC

## 2020-06-16 NOTE — Patient Instructions (Addendum)
Medication Instructions:  Your physician has recommended you make the following change in your medication: START: Imdur 30 mg once daily START: Nitroglycerin 0.4 mg take one tablet by mouth every 5 minutes up to three times as needed for chest pain. START: Renexa 500 mg twice daily *If you need a refill on your cardiac medications before your next appointment, please call your pharmacy*   Lab Work: Your physician recommends that you return for lab work: TODAY: CBC If you have labs (blood work) drawn today and your tests are completely normal, you will receive your results only by: Marland Kitchen MyChart Message (if you have MyChart) OR . A paper copy in the mail If you have any lab test that is abnormal or we need to change your treatment, we will call you to review the results.   Testing/Procedures: Your physician has requested that you have a cardiac MRI. Cardiac MRI uses a computer to create images of your heart as its beating, producing both still and moving pictures of your heart and major blood vessels. For further information please visit [email or web address]. Please follow the instruction sheet given to you today for more information.   Follow-Up: At [facility], you and your health needs are our priority.  As part of our continuing mission to provide you with exceptional heart care, we have created designated Provider Care Teams.  These Care Teams include your primary Cardiologist (physician) and Advanced Practice Providers (APPs -  Physician Assistants and Nurse Practitioners) who all work together to provide you with the care you need, when you need it.  We recommend signing up for the patient portal called "MyChart".  Sign up information is provided on this After Visit Summary.  MyChart is used to connect with patients for Virtual Visits (Telemedicine).  Patients are able to view lab/test results, encounter notes, upcoming appointments, etc.  Non-urgent messages can be sent to your provider  as well.   To learn more about what you can do with MyChart, go to [email or web address].    Your next appointment:   6 week(s)  The format for your next appointment:   In Person  Provider:   Thomasene Ripple, DO   Other Instructions You have been scheduled to see Gi on May 17th, 2022. The appointment is at 3:20 please be there at 3:10.   You will be scheduled for a Cardiac MRI . Please arrive at the [facility] main entrance of [facility] (30-45 minutes prior to test start time). ?  [facility]  [address]  [place], [place] [address]  [phone]  Proceed to the [facility] Radiology Department (First Floor).  ?  Magnetic resonance imaging (MRI) is a painless test that produces images of the inside of the body without using X-rays. During an MRI, strong magnets and radio waves work together in a Data processing manager to form detailed images. MRI images may provide more details about a medical condition than X-rays, CT scans, and ultrasounds can provide.  You may be given earphones to listen for instructions.  You may eat a light breakfast and take medications as ordered with the exception of (fluid pill, other). If a contrast material will be used, an IV will be inserted into one of your veins. Contrast material will be injected into your IV.  You will be asked to remove all metal, including: Watch, jewelry, and other metal objects including hearing aids, hair pieces and dentures. (Braces and fillings normally are not a problem.)  If  contrast material was used:  It will leave your body through your urine within a day. You may be told to drink plenty of fluids to help flush the contrast material out of your system.  TEST WILL TAKE APPROXIMATELY 1 HOUR  PLEASE NOTIFY SCHEDULING AT LEAST 24 HOURS IN ADVANCE IF YOU ARE UNABLE TO KEEP YOUR APPOINTMENT.    Nuclear Medicine Exam A nuclear medicine exam is a safe and painless imaging test. It helps your health care  provider detect and diagnose diseases. It also provides information about the ways your organs work and how they are structured. For a nuclear medicine exam, you will be given a radioactive material, called a tracer, that is absorbed by your body's organs. A large scanning machine detects the radioactive tracer and creates pictures of the areas that your health care provider wants to know more about. There are several kinds of nuclear medicine exams. They include the following:  CT scan.  MRI scan.  PET scan.  SPECT scan. Tell your health care provider about:  Any allergies you have.  All medicines you are taking, including vitamins, herbs, eye drops, creams, and over-the-counter medicines.  Any problems you or family members have had with anesthetic medicines.  Any blood disorders you have.  Any surgeries you have had.  Any medical conditions you have.  Whether you are pregnant, may be pregnant, or are breastfeeding. What are the risks? Generally, this is a safe procedure. However, problems may occur, such as:  An allergic reaction to the tracer. This is rare.  Exposure to radiation (a small amount). What happens before the procedure? Medicines Ask your health care provider about:  Changing or stopping your regular medicines. This is especially important if you are taking diabetes medicines or blood thinners.  Taking medicines such as aspirin and ibuprofen. These medicines can thin your blood. Do not take these medicines unless your health care provider tells you to take them.  Taking over-the-counter medicines, vitamins, herbs, and supplements. General instructions  Follow instructions from your health care provider about eating and drinking restrictions.  Do not wear jewelry.  Wear loose, comfortable clothing. You may be asked to wear a hospital gown for the procedure.  Bring previous imaging studies, such as X-rays, with you to the exam if they are  available. What happens during the procedure?  An IV may be inserted into one of your veins.  You will be asked to lie on a table or sit in a chair.  You will be given the radioactive tracer. You may get: ? A pill or liquid to swallow. ? An injection. ? Medicine through your IV. ? A gas to inhale.  A large scanning machine will be used to create images of your body. After the pictures are taken, you may have to wait until your health care provider can make sure that enough images were taken. The procedure may vary among health care providers and hospitals.   What can I expect after the procedure?  It is up to you to get the results of your procedure. Ask your health care provider, or the department that is doing the procedure, when your results will be ready. Also ask: ? How will I get my results? ? What are my treatment options? ? What other tests do I need? ? What are my next steps? Follow these instructions at home:  Drink enough water to keep your urine pale yellow (6-8 glasses). This helps to remove the radioactive tracer from  your body.  You may return to your normal activities as told by your health care provider. Get help right away if you:  Have problems breathing. This symptom may represent a serious problem that is an emergency. Do not wait to see if the symptoms will go away. Get medical help right away. Call your local emergency services (911 in the [place] Do not drive yourself to the hospital. Summary  A nuclear medicine exam is a safe and painless imaging test. It provides information about how your organs are working. It is also used to detect and diagnose diseases.  During the procedure, you will be given a radioactive tracer. A large scanning machine will create images of your body.  You may resume your normal activities after the procedure. Follow your health care provider's instructions.  Get help right away if you have problems breathing. This information  is not intended to replace advice given to you by your health care provider. Make sure you discuss any questions you have with your health care provider. Document Revised: 05/30/2019 Document Reviewed: 05/30/2019 Elsevier Patient Education  2021 [employer].

## 2020-06-16 NOTE — Progress Notes (Signed)
Cardiology Office Note:    Date:  06/16/2020   ID:  Vincent Guerrero, DOB 1960/04/06, MRN [id number]  PCP:  Simone Curia, MD  Cardiologist:  Thomasene Ripple, DO  Electrophysiologist:  None   Referring MD: Simone Curia, MD   " I feel better"  History of Present Illness:    Vincent Guerrero is a 60 y.o. male with a hx of coronary artery disease status post stentin the RCAa result of an MI in 2010,hyperlipidemia (not on statins due to statin intolerance), diabetes mellitus (on Metformin and glipizide),, current smokerCVA in 2011, moderate aortic stenosis in April 2018.  In October the patient was short of breath were concerned that this may have been coronary artery disease therefore I set the patient up for a left heart catheterization but unfortunately his hemoglobin was significantly low.   He was seen by with GI he has had a colonoscopy which showed some ulcers.    When I saw the patient in March 2022 he had stopped his Repatha as well as his Imdur. He felt both of these medication was playing with his mind.  I therefore placed the patient on Ranexa to help with any angina symptoms.  He had some wheezing that they and I referred the patient to pulmonary as he was not treated for likely undiagnosed COPD given his long smoking history.  His blood pressure was also elevated that day when asked the patient said he had not taken his blood pressure medication.  I saw the patient on June 02, 2020 he had complained of some shortness of breath and chest tightness at the time my office he has been wheezing.  He reported he had not been using his inhalers.  Placed a referral for pulmonary as well.  During that visit he had not been taking the Ranexa as recommended and he had previously stopped the Imdur.  His hemoglobin was also low during that visit which appeared to be dropping.  And he had stopped the Protonix.  The patient was here with his fiance that day I explained to him that for his  angina he needed to stay on the Ranexa and also hopefully if he could tolerate the Imdur.  I restarted on the Ranexa at that visit.  I also explained to the patient that giving his history of GI bleeding he would need to stay on the Protonix.    I explained to the patient that we needed him to see GI to make a follow-up to make sure that he could potentially get a capsule endoscopy given his recent endoscopy in October was normal but yet he is still having lower hemoglobin.  At the same time I repeated an echocardiogram as the patient did have moderate aortic stenosis on previous study to make sure that worsening valvular disease is not playing a role.  Since I saw the patient he has not restarted his Protonix he has not taken the Ranexa.  He tells me he feels a little better today but this is off and on.  He was seen in the emergency department at that time his hemoglobin was 7.9 he was started on iron therapy.   Past Medical History:  Diagnosis Date  . Age-related nuclear cataract of both eyes 11/27/2018  . Anemia   . Bilateral recurrent inguinal hernia without obstruction or gangrene 05/09/2016   Added automatically from request for surgery [id number]  . Chest pressure 11/06/2019  . Coronary artery disease involving native artery of transplanted heart  without angina pectoris   . Cortical age-related cataract of both eyes 11/27/2018  . Depression   . Diabetes mellitus (HCC)   . Hyperlipidemia LDL goal <70   . Hypertension   . Inguinal hernia without obstruction or gangrene    BILATERAL  . Primary open angle glaucoma (POAG) of both eyes, severe stage 11/27/2018  . Primary open angle glaucoma (POAG) of left eye, severe stage 11/27/2018  . Tobacco abuse 11/05/2019  . Tobacco use 11/06/2019    Past Surgical History:  Procedure Laterality Date  . BIOPSY  11/27/2019   Procedure: BIOPSY;  Surgeon: Sherrilyn Rist, MD;  Location: [facility] ENDOSCOPY;  Service: Gastroenterology;;  . COLONOSCOPY WITH  PROPOFOL N/A 11/27/2019   Procedure: COLONOSCOPY WITH PROPOFOL;  Surgeon: Sherrilyn Rist, MD;  Location: [facility] ENDOSCOPY;  Service: Gastroenterology;  Laterality: N/A;  . ESOPHAGOGASTRODUODENOSCOPY (EGD) WITH PROPOFOL N/A 11/27/2019   Procedure: ESOPHAGOGASTRODUODENOSCOPY (EGD) WITH PROPOFOL;  Surgeon: Sherrilyn Rist, MD;  Location: [facility] ENDOSCOPY;  Service: Gastroenterology;  Laterality: N/A;  . HEMOSTASIS CLIP PLACEMENT  11/27/2019   Procedure: HEMOSTASIS CLIP PLACEMENT;  Surgeon: Sherrilyn Rist, MD;  Location: [facility] ENDOSCOPY;  Service: Gastroenterology;;  . HERNIA REPAIR  1998  . LEFT HEART CATH AND CORONARY ANGIOGRAPHY N/A 06/04/2016   Procedure: Left Heart Cath and Coronary Angiography;  Surgeon: Lyn Records, MD;  Location: [facility] INVASIVE CV LAB;  Service: Cardiovascular;  Laterality: N/A;    Current Medications: Current Meds  Medication Sig  . acetaminophen (TYLENOL) 325 MG tablet Take 2 tablets (650 mg total) by mouth every 6 (six) hours as needed for mild pain (or Fever >/= 101).  . ferrous sulfate 325 (65 FE) MG tablet Take 1 tablet (325 mg total) by mouth daily.  Marland Kitchen glipiZIDE (GLUCOTROL) 10 MG tablet Take 10 mg by mouth 2 (two) times daily before a meal.   . isosorbide mononitrate (IMDUR) 30 MG 24 hr tablet Take 1 tablet (30 mg total) by mouth daily.  Marland Kitchen lisinopril (PRINIVIL,ZESTRIL) 20 MG tablet Take 1 tablet (20 mg total) by mouth 2 (two) times daily.  . metFORMIN (GLUCOPHAGE) 1000 MG tablet Take 1,000 mg by mouth 2 (two) times daily with a meal.  . metoprolol (LOPRESSOR) 50 MG tablet Take 1 tablet (50 mg total) by mouth 2 (two) times daily.  . Multiple Vitamins-Minerals (CENTRUM SILVER 50+MEN) TABS Take 1 tablet by mouth daily with breakfast.  . Netarsudil-Latanoprost (ROCKLATAN) 0.02-0.005 % SOLN Place 1 drop into the right eye at bedtime.   . nitroGLYCERIN (NITROSTAT) 0.4 MG SL tablet Place 1 tablet (0.4 mg total) under the tongue every 5 (five) minutes as needed for chest  pain.  . pantoprazole (PROTONIX) 40 MG tablet Take 1 tablet (40 mg total) by mouth daily.  Marland Kitchen POTASSIUM PO Take 1 tablet by mouth 3 (three) times a week. Strength unknown  . ranolazine (RANEXA) 500 MG 12 hr tablet Take 1 tablet (500 mg total) by mouth 2 (two) times daily.  . Tiotropium Bromide Monohydrate (SPIRIVA RESPIMAT) 2.5 MCG/ACT AERS Inhale 2 puffs into the lungs daily.  . [DISCONTINUED] ranolazine (RANEXA) 500 MG 12 hr tablet Take 1 tablet (500 mg total) by mouth 2 (two) times daily.     Allergies:   Patient has no known allergies.   Social History   Socioeconomic History  . Marital status: Legally Separated    Spouse name: Not on file  . Number of children: Not on file  . Years of education: Not  on file  . Highest education level: Not on file  Occupational History  . Not on file  Tobacco Use  . Smoking status: Current Every Day Smoker    Packs/day: 1.00    Years: 40.00    Pack years: 40.00    Types: Cigarettes  . Smokeless tobacco: Current User    Types: Chew  . Tobacco comment: half pack daily-05/05/2020-AH  Vaping Use  . Vaping Use: Never used  Substance and Sexual Activity  . Alcohol use: No  . Drug use: Yes    Types: Marijuana  . Sexual activity: Yes  Other Topics Concern  . Not on file  Social History Narrative  . Not on file   Social Determinants of Health   Financial Resource Strain: Not on file  Food Insecurity: Not on file  Transportation Needs: Not on file  Physical Activity: Not on file  Stress: Not on file  Social Connections: Not on file     Family History: The patient's family history includes CAD in his father; Diabetes in his father; Heart failure in his mother; Hypertension in his mother; Prostate cancer in his brother.  ROS:   Review of Systems  Constitution: Negative for decreased appetite, fever and weight gain.  HENT: Negative for congestion, ear discharge, hoarse voice and sore throat.   Eyes: Negative for discharge, redness,  vision loss in right eye and visual halos.  Cardiovascular: Negative for chest pain, dyspnea on exertion, leg swelling, orthopnea and palpitations.  Respiratory: Negative for cough, hemoptysis, shortness of breath and snoring.   Endocrine: Negative for heat intolerance and polyphagia.  Hematologic/Lymphatic: Negative for bleeding problem. Does not bruise/bleed easily.  Skin: Negative for flushing, nail changes, rash and suspicious lesions.  Musculoskeletal: Negative for arthritis, joint pain, muscle cramps, myalgias, neck pain and stiffness.  Gastrointestinal: Negative for abdominal pain, bowel incontinence, diarrhea and excessive appetite.  Genitourinary: Negative for decreased libido, genital sores and incomplete emptying.  Neurological: Negative for brief paralysis, focal weakness, headaches and loss of balance.  Psychiatric/Behavioral: Negative for altered mental status, depression and suicidal ideas.  Allergic/Immunologic: Negative for HIV exposure and persistent infections.    EKGs/Labs/Other Studies Reviewed:    The following studies were reviewed today:   EKG: None today   Transthoracic echocardiogram which was done on April 07, 2020 IMPRESSIONS  1. Left ventricular ejection fraction, by estimation, is 55 to 60%. The  left ventricle has normal function. The left ventricle has no regional  wall motion abnormalities. There is severe concentric left ventricular  hypertrophy. Left ventricular diastolic  parameters are consistent with Grade III diastolic dysfunction  (restrictive).  2. Right ventricular systolic function is normal. The right ventricular  size is normal. There is normal pulmonary artery systolic pressure.  3. Left atrial size was mildly dilated.  4. The mitral valve is normal in structure. No evidence of mitral valve  regurgitation. No evidence of mitral stenosis.  5. There is a small (1.81 x 1.46 cm) mass on the posterior leaflet of the  tricuspid valve,  best seen in the subcostal view (clip 64), which appears  to be present on previous echo done in 10/2019 (clip 68). May need further  cardiac imaging study (cardiac  MRI) for better visualization.  6. The aortic valve is calcified. There is moderate calcification of the  aortic valve. Aortic valve regurgitation is mild to moderate. Moderate to  severe aortic valve stenosis.  7. 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  8. The inferior vena cava is normal  in size with greater than 50%  respiratory variability, suggesting right atrial pressure of 3 mmHg.   Comparison(s): A prior study was performed on 10/2019. No significant  change from prior study.   FINDINGS  Left Ventricle: Left ventricular ejection fraction, by estimation, is 55  to 60%. The left ventricle has normal function. The left ventricle has no  regional wall motion abnormalities. Global longitudinal strain performed  but not reported based on  interpreter judgement due to suboptimal tracking. The left ventricular  internal cavity size was normal in size. There is severe concentric left  ventricular hypertrophy. Left ventricular diastolic parameters are  consistent with Grade III diastolic  dysfunction (restrictive).   Right Ventricle: The right ventricular size is normal. No increase in  right ventricular wall thickness. Right ventricular systolic function is  normal. There is normal pulmonary artery systolic pressure. The tricuspid  regurgitant velocity is 2.10 m/s, and  with an assumed right atrial pressure of 3 mmHg, the estimated right  ventricular systolic pressure is 20.6 mmHg.   Left Atrium: Left atrial size was mildly dilated.   Right Atrium: Right atrial size was normal in size.   Pericardium: There is no evidence of pericardial effusion.   Mitral Valve: The mitral valve is normal in structure. Mild mitral annular  calcification. No evidence of mitral valve regurgitation. No evidence of  mitral valve stenosis.    Tricuspid Valve: There is a small (1.81 x 1.46 cm) mass on the posterior  leaflet of the tricuspid valve, best seen in the subcostal view (clip 64),  which appears to be present on previous echo done in 10/2019 (clip 68). May  need further cardiac imaging  study (cardiac MRI) for better visualization. The tricuspid valve is  normal in structure. Tricuspid valve regurgitation is trivial. No evidence  of tricuspid stenosis.   Aortic Valve: The aortic valve is calcified. There is moderate  calcification of the aortic valve. There is mild aortic valve annular  calcification. Aortic valve regurgitation is mild to moderate. Aortic  regurgitation PHT measures 609 msec. Moderate to  severe aortic stenosis is present. Aortic valve mean gradient measures  43.0 mmHg. Aortic valve peak gradient measures 70.6 mmHg. Aortic valve  area, by VTI measures 1.25 cm.   Pulmonic Valve: The pulmonic valve was normal in structure. Pulmonic valve  regurgitation is not visualized. No evidence of pulmonic stenosis.   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 The aortic root is normal in size  and structure.   Venous: The inferior vena cava is normal in size with greater than 50%  respiratory variability, suggesting right atrial pressure of 3 mmHg.   IAS/Shunts: No atrial level shunt detected by color flow Doppler.     Recent Labs: 06/10/2020: ALT 12; B Natriuretic Peptide 53.0; BUN 26; Creatinine, Ser 1.43; Hemoglobin 7.9; Magnesium 1.7; Platelets 430; Potassium 4.4; Sodium 136  Recent Lipid Panel    Component Value Date/Time   CHOL 82 11/27/2019 1004   CHOL 172 12/10/2018 1058   TRIG 60 11/27/2019 1004   HDL 37 (L) 11/27/2019 1004   HDL 43 12/10/2018 1058   CHOLHDL 2.2 11/27/2019 1004   VLDL 12 11/27/2019 1004   LDLCALC 33 11/27/2019 1004   LDLCALC 100 (H) 12/10/2018 1058    Physical Exam:    VS:  BP 130/78   Pulse 84   Ht  (1.778 m)   Wt 203 lb (92.1 kg)   SpO2 98%   BMI 29.13 kg/m  Wt Readings from Last 3 Encounters:  06/16/20 203 lb (92.1 kg)  06/10/20 205 lb (93 kg)  06/02/20 205 lb 6.4 oz (93.2 kg)     GEN: Well nourished, well developed in no acute distress HEENT: Normal NECK: No JVD; No carotid bruits LYMPHATICS: No lymphadenopathy CARDIAC: S1S2 noted,RRR, no murmurs, rubs, gallops RESPIRATORY:  Clear to auscultation without rales, wheezing or rhonchi  ABDOMEN: Soft, non-tender, non-distended, +bowel sounds, no guarding. EXTREMITIES: No edema, No cyanosis, no clubbing MUSCULOSKELETAL:  No deformity  SKIN: Warm and dry NEUROLOGIC:  Alert and oriented x 3, non-focal PSYCHIATRIC:  Normal affect, good insight  ASSESSMENT:    1. Shortness of breath   2. Symptomatic anemia   3. Tricuspid valve mass   4. Coronary artery disease involving native coronary artery of native heart, unspecified whether angina present   5. Hyperlipidemia, unspecified hyperlipidemia type   6. Primary hypertension   7. Chest pain of uncertain etiology    PLAN:    I had a discussion with the patient today in the office.  I explained the dilemma to him.  First I explained to the patient that it is not helping with his clinical symptoms and his clinical care that he is not taking his antianginals.  He needs to be on Ranexa as well as the Imdur but the patient has opted not to take this medication so is hard to tell if optimal medical therapy will help in his situation.  In addition since October his hemoglobin is dropping again and his shortness Covaryx could likely be from symptomatic anemia.  The most recent was 7.9.  I did asked the patient to set up an appointment to see GI but he failed to do so.  Therefore today we were able to reach out to our GI colleagues and set up an appointment for the patient for Jun 21, 2020 at 3:20 PM.  He understands that he needs to be there at 3:10 PM.  His recent endoscopy did not show any evidence of bleeding.  May be on evaluation he could benefit  from a capsule endoscopy however I will defer to my GI colleagues for this.   He may eventually need to be evaluated by hematology if his GI work-up is completely normal.  In terms of his coronary artery disease I am really hoping that the patient follow through after this discussion today to take his antianginals as we need to make sure that he is not having any significant bleeding prior to getting his left heart catheterization in the meantime hoping the medical therapy will improve his symptoms.  His recent echo show a possible mass on his tricuspid valve.  With question of GI bleeding a TEE will not be a good test for this patient therefore we will get a cardiac MRI to reassess this.  He still smokes.  I talked to the patient about smoking cessation.  And his diabetes is being managed by his primary care provider.  His blood pressure is acceptable in the office today.   The patient is in agreement with the above plan. The patient left the office in stable condition.  The patient will follow up in 6 weeks or sooner.    Medication Adjustments/Labs and Tests Ordered: Current medicines are reviewed at length with the patient today.  Concerns regarding medicines are outlined above.  Orders Placed This Encounter  Procedures  . MR Card Morphology Wo/W Cm  . CBC with Differential/Platelet   Meds ordered this encounter  Medications  . ranolazine (RANEXA) 500 MG 12 hr tablet    Sig: Take 1 tablet (500 mg total) by mouth 2 (two) times daily.    Dispense:  180 tablet    Refill:  3  . isosorbide mononitrate (IMDUR) 30 MG 24 hr tablet    Sig: Take 1 tablet (30 mg total) by mouth daily.    Dispense:  90 tablet    Refill:  3  . nitroGLYCERIN (NITROSTAT) 0.4 MG SL tablet    Sig: Place 1 tablet (0.4 mg total) under the tongue every 5 (five) minutes as needed for chest pain.    Dispense:  90 tablet    Refill:  3    Patient Instructions   Medication Instructions:  Your physician has  recommended you make the following change in your medication: START: Imdur 30 mg once daily START: Nitroglycerin 0.4 mg take one tablet by mouth every 5 minutes up to three times as needed for chest pain. START: Renexa 500 mg twice daily *If you need a refill on your cardiac medications before your next appointment, please call your pharmacy*   Lab Work: Your physician recommends that you return for lab work: TODAY: CBC If you have labs (blood work) drawn today and your tests are completely normal, you will receive your results only by: Marland Kitchen. MyChart Message (if you have MyChart) OR . A paper copy in the mail If you have any lab test that is abnormal or we need to change your treatment, we will call you to review the results.   Testing/Procedures: Your physician has requested that you have a cardiac MRI. Cardiac MRI uses a computer to create images of your heart as its beating, producing both still and moving pictures of your heart and major blood vessels. For further information please visit [email or web address]. Please follow the instruction sheet given to you today for more information.   Follow-Up: At [facility], you and your health needs are our priority.  As part of our continuing mission to provide you with exceptional heart care, we have created designated Provider Care Teams.  These Care Teams include your primary Cardiologist (physician) and Advanced Practice Providers (APPs -  Physician Assistants and Nurse Practitioners) who all work together to provide you with the care you need, when you need it.  We recommend signing up for the patient portal called "MyChart".  Sign up information is provided on this After Visit Summary.  MyChart is used to connect with patients for Virtual Visits (Telemedicine).  Patients are able to view lab/test results, encounter notes, upcoming appointments, etc.  Non-urgent messages can be sent to your provider as well.   To learn more about what you can do  with MyChart, go to [email or web address].    Your next appointment:   6 week(s)  The format for your next appointment:   In Person  Provider:   Thomasene RippleKardie Darling Cieslewicz, DO   Other Instructions You have been scheduled to see Gi on May 17th, 2022. The appointment is at 3:20 please be there at 3:10.   You will be scheduled for a Cardiac MRI . [facility] (30-45 minutes prior to test start time). ?  [facility]  [address]  [place], [place] [address]  [phone]  Proceed to the [facility] Radiology Department (First Floor).  ?  Magnetic resonance imaging (MRI) is a painless test that produces images of the inside of the body  without using X-rays. During an MRI, strong magnets and radio waves work together in a Data processing manager to form detailed images. MRI images may provide more details about a medical condition than X-rays, CT scans, and ultrasounds can provide.  You may be given earphones to listen for instructions.  You may eat a light breakfast and take medications as ordered with the exception of (fluid pill, other). If a contrast material will be used, an IV will be inserted into one of your veins. Contrast material will be injected into your IV.  You will be asked to remove all metal, including: Watch, jewelry, and other metal objects including hearing aids, hair pieces and dentures. (Braces and fillings normally are not a problem.)  If contrast material was used:  It will leave your body through your urine within a day. You may be told to drink plenty of fluids to help flush the contrast material out of your system.  TEST WILL TAKE APPROXIMATELY 1 HOUR  PLEASE NOTIFY SCHEDULING AT LEAST 24 HOURS IN ADVANCE IF YOU ARE UNABLE TO KEEP YOUR APPOINTMENT.    Nuclear Medicine Exam A nuclear medicine exam is a safe and painless imaging test. It helps your health care provider detect and diagnose diseases. It also provides  information about the ways your organs work and how they are structured. For a nuclear medicine exam, you will be given a radioactive material, called a tracer, that is absorbed by your body's organs. A large scanning machine detects the radioactive tracer and creates pictures of the areas that your health care provider wants to know more about. There are several kinds of nuclear medicine exams. They include the following:  CT scan.  MRI scan.  PET scan.  SPECT scan. Tell your health care provider about:  Any allergies you have.  All medicines you are taking, including vitamins, herbs, eye drops, creams, and over-the-counter medicines.  Any problems you or family members have had with anesthetic medicines.  Any blood disorders you have.  Any surgeries you have had.  Any medical conditions you have.  Whether you are pregnant, may be pregnant, or are breastfeeding. What are the risks? Generally, this is a safe procedure. However, problems may occur, such as:  An allergic reaction to the tracer. This is rare.  Exposure to radiation (a small amount). What happens before the procedure? Medicines Ask your health care provider about:  Changing or stopping your regular medicines. This is especially important if you are taking diabetes medicines or blood thinners.  Taking medicines such as aspirin and ibuprofen. These medicines can thin your blood. Do not take these medicines unless your health care provider tells you to take them.  Taking over-the-counter medicines, vitamins, herbs, and supplements. General instructions  Follow instructions from your health care provider about eating and drinking restrictions.  Do not wear jewelry.  Wear loose, comfortable clothing. You may be asked to wear a hospital gown for the procedure.  Bring previous imaging studies, such as X-rays, with you to the exam if they are available. What happens during the procedure?  An IV may be inserted  into one of your veins.  You will be asked to lie on a table or sit in a chair.  You will be given the radioactive tracer. You may get: ? A pill or liquid to swallow. ? An injection. ? Medicine through your IV. ? A gas to inhale.  A large scanning machine will be used to create images of your body. After  the pictures are taken, you may have to wait until your health care provider can make sure that enough images were taken. The procedure may vary among health care providers and hospitals.   What can I expect after the procedure?  It is up to you to get the results of your procedure. Ask your health care provider, or the department that is doing the procedure, when your results will be ready. Also ask: ? How will I get my results? ? What are my treatment options? ? What other tests do I need? ? What are my next steps? Follow these instructions at home:  Drink enough water to keep your urine pale yellow (6-8 glasses). This helps to remove the radioactive tracer from your body.  You may return to your normal activities as told by your health care provider. Get help right away if you:  Have problems breathing. This symptom may represent a serious problem that is an emergency. Do not wait to see if the symptoms will go away. Get medical help right away. Call your local emergency services (911 in the [place] Do not drive yourself to the hospital. Summary  A nuclear medicine exam is a safe and painless imaging test. It provides information about how your organs are working. It is also used to detect and diagnose diseases.  During the procedure, you will be given a radioactive tracer. A large scanning machine will create images of your body.  You may resume your normal activities after the procedure. Follow your health care provider's instructions.  Get help right away if you have problems breathing. This information is not intended to replace advice given to you by your health care  provider. Make sure you discuss any questions you have with your health care provider. Document Revised: 05/30/2019 Document Reviewed: 05/30/2019 Elsevier Patient Education  2021 Elsevier Inc.      Adopting a Healthy Lifestyle.  Know what a healthy weight is for you (roughly BMI <25) and aim to maintain this   Aim for 7+ servings of fruits and vegetables daily   65-80+ fluid ounces of water or unsweet tea for healthy kidneys   Limit to max 1 drink of alcohol per day; avoid smoking/tobacco   Limit animal fats in diet for cholesterol and heart health - choose grass fed whenever available   Avoid highly processed foods, and foods high in saturated/trans fats   Aim for low stress - take time to unwind and care for your mental health   Aim for 150 min of moderate intensity exercise weekly for heart health, and weights twice weekly for bone health   Aim for 7-9 hours of sleep daily   When it comes to diets, agreement about the perfect plan isnt easy to find, even among the experts. Experts at the [facility] [employer] developed an idea known as the Healthy Eating Plate. Just imagine a plate divided into logical, healthy portions.   The emphasis is on diet quality:   Load up on vegetables and fruits - one-half of your plate: Aim for color and variety, and remember that potatoes dont count.   Go for whole grains - one-quarter of your plate: Whole wheat, barley, wheat berries, quinoa, oats, brown rice, and foods made with them. If you want pasta, go with whole wheat pasta.   Protein power - one-quarter of your plate: Fish, chicken, beans, and nuts are all healthy, versatile protein sources. Limit red meat.   The diet, however, does go beyond  the plate, offering a few other suggestions.   Use healthy plant oils, such as olive, canola, soy, corn, sunflower and peanut. Check the labels, and avoid partially hydrogenated oil, which have unhealthy trans fats.   If youre  thirsty, drink water. Coffee and tea are good in moderation, but skip sugary drinks and limit milk and dairy products to one or two daily servings.   The type of carbohydrate in the diet is more important than the amount. Some sources of carbohydrates, such as vegetables, fruits, whole grains, and beans-are healthier than others.   Finally, stay active  Signed, Thomasene Ripple, DO  06/16/2020 12:20 PM    [facility]

## 2020-06-17 ENCOUNTER — Telehealth: Payer: Self-pay

## 2020-06-17 LAB — CBC WITH DIFFERENTIAL/PLATELET
Basophils Absolute: 0 10*3/uL (ref 0.0–0.2)
Basos: 0 %
EOS (ABSOLUTE): 0.1 10*3/uL (ref 0.0–0.4)
Eos: 1 %
Hematocrit: 25.4 % — ABNORMAL LOW (ref 37.5–51.0)
Hemoglobin: 7.3 g/dL — ABNORMAL LOW (ref 13.0–17.7)
Immature Grans (Abs): 0 10*3/uL (ref 0.0–0.1)
Immature Granulocytes: 0 %
Lymphocytes Absolute: 2.8 10*3/uL (ref 0.7–3.1)
Lymphs: 25 %
MCH: 18.6 pg — ABNORMAL LOW (ref 26.6–33.0)
MCHC: 28.7 g/dL — ABNORMAL LOW (ref 31.5–35.7)
MCV: 65 fL — ABNORMAL LOW (ref 79–97)
Monocytes Absolute: 1.2 10*3/uL — ABNORMAL HIGH (ref 0.1–0.9)
Monocytes: 10 %
Neutrophils Absolute: 7 10*3/uL (ref 1.4–7.0)
Neutrophils: 64 %
Platelets: 348 10*3/uL (ref 150–450)
RBC: 3.92 x10E6/uL — ABNORMAL LOW (ref 4.14–5.80)
RDW: 19.5 % — ABNORMAL HIGH (ref 11.6–15.4)
WBC: 11.1 10*3/uL — ABNORMAL HIGH (ref 3.4–10.8)

## 2020-06-17 NOTE — Telephone Encounter (Signed)
Left message for patient to return our call us back, Per Dr. Servando Salina he needs to go to [facility] to get a transfusion due to a decrease in his Hemoglobin.

## 2020-06-21 ENCOUNTER — Ambulatory Visit: Payer: Medicaid Other | Admitting: Gastroenterology

## 2020-06-21 DIAGNOSIS — R001 Bradycardia, unspecified: Secondary | ICD-10-CM

## 2020-06-22 ENCOUNTER — Other Ambulatory Visit: Payer: Self-pay

## 2020-06-22 DIAGNOSIS — D649 Anemia, unspecified: Secondary | ICD-10-CM

## 2020-06-22 NOTE — Progress Notes (Signed)
Referral ordered

## 2020-06-23 ENCOUNTER — Telehealth: Payer: Self-pay | Admitting: Hematology and Oncology

## 2020-06-23 NOTE — Telephone Encounter (Signed)
Patient was actually referred by Hospitalist, Dr Maryln Manuel for Iron Def/Anemia

## 2020-06-23 NOTE — Telephone Encounter (Signed)
Patient referred by Dr Thomasene Ripple for Low Hgb.  Appt made for 07/06/20 Consult 9:00 am - Labs 10:30 am

## 2020-07-05 ENCOUNTER — Telehealth: Payer: Self-pay | Admitting: Cardiology

## 2020-07-05 NOTE — Telephone Encounter (Signed)
Left message for patient to call and discuss preferred scheduling weekdays and times for the Cardiac MIR ordered by Thomasene Ripple, DO

## 2020-07-06 ENCOUNTER — Other Ambulatory Visit: Payer: Self-pay

## 2020-07-06 ENCOUNTER — Encounter: Payer: Self-pay | Admitting: Hematology and Oncology

## 2020-07-06 ENCOUNTER — Inpatient Hospital Stay: Payer: Medicaid Other

## 2020-07-06 ENCOUNTER — Inpatient Hospital Stay: Payer: Medicaid Other | Attending: Hematology and Oncology | Admitting: Hematology and Oncology

## 2020-07-06 VITALS — BP 140/73 | HR 67 | Temp 98.4°F | Resp 18 | Ht 70.0 in | Wt 204.8 lb

## 2020-07-06 DIAGNOSIS — Z720 Tobacco use: Secondary | ICD-10-CM

## 2020-07-06 DIAGNOSIS — I10 Essential (primary) hypertension: Secondary | ICD-10-CM | POA: Diagnosis not present

## 2020-07-06 DIAGNOSIS — F17210 Nicotine dependence, cigarettes, uncomplicated: Secondary | ICD-10-CM | POA: Insufficient documentation

## 2020-07-06 DIAGNOSIS — K219 Gastro-esophageal reflux disease without esophagitis: Secondary | ICD-10-CM | POA: Insufficient documentation

## 2020-07-06 DIAGNOSIS — K2970 Gastritis, unspecified, without bleeding: Secondary | ICD-10-CM | POA: Insufficient documentation

## 2020-07-06 DIAGNOSIS — K259 Gastric ulcer, unspecified as acute or chronic, without hemorrhage or perforation: Secondary | ICD-10-CM | POA: Insufficient documentation

## 2020-07-06 DIAGNOSIS — D539 Nutritional anemia, unspecified: Secondary | ICD-10-CM

## 2020-07-06 DIAGNOSIS — Z79899 Other long term (current) drug therapy: Secondary | ICD-10-CM | POA: Diagnosis not present

## 2020-07-06 DIAGNOSIS — E559 Vitamin D deficiency, unspecified: Secondary | ICD-10-CM | POA: Insufficient documentation

## 2020-07-06 DIAGNOSIS — E119 Type 2 diabetes mellitus without complications: Secondary | ICD-10-CM | POA: Diagnosis not present

## 2020-07-06 DIAGNOSIS — Z7984 Long term (current) use of oral hypoglycemic drugs: Secondary | ICD-10-CM | POA: Diagnosis not present

## 2020-07-06 DIAGNOSIS — D500 Iron deficiency anemia secondary to blood loss (chronic): Secondary | ICD-10-CM | POA: Diagnosis present

## 2020-07-06 DIAGNOSIS — K909 Intestinal malabsorption, unspecified: Secondary | ICD-10-CM | POA: Diagnosis not present

## 2020-07-06 DIAGNOSIS — K2950 Unspecified chronic gastritis without bleeding: Secondary | ICD-10-CM

## 2020-07-06 LAB — TSH: TSH: 1.171 u[IU]/mL (ref 0.350–4.500)

## 2020-07-06 LAB — CBC WITH DIFFERENTIAL/PLATELET
Abs Immature Granulocytes: 0.03 10*3/uL (ref 0.00–0.07)
Basophils Absolute: 0 10*3/uL (ref 0.0–0.1)
Basophils Relative: 0 %
Eosinophils Absolute: 0.1 10*3/uL (ref 0.0–0.5)
Eosinophils Relative: 1 %
HCT: 32.3 % — ABNORMAL LOW (ref 39.0–52.0)
Hemoglobin: 9.1 g/dL — ABNORMAL LOW (ref 13.0–17.0)
Immature Granulocytes: 0 %
Lymphocytes Relative: 28 %
Lymphs Abs: 2.8 10*3/uL (ref 0.7–4.0)
MCH: 20 pg — ABNORMAL LOW (ref 26.0–34.0)
MCHC: 28.2 g/dL — ABNORMAL LOW (ref 30.0–36.0)
MCV: 70.8 fL — ABNORMAL LOW (ref 80.0–100.0)
Monocytes Absolute: 1.1 10*3/uL — ABNORMAL HIGH (ref 0.1–1.0)
Monocytes Relative: 10 %
Neutro Abs: 6.2 10*3/uL (ref 1.7–7.7)
Neutrophils Relative %: 61 %
Platelets: 413 10*3/uL — ABNORMAL HIGH (ref 150–400)
RBC: 4.56 MIL/uL (ref 4.22–5.81)
RDW: 26.6 % — ABNORMAL HIGH (ref 11.5–15.5)
WBC: 10.3 10*3/uL (ref 4.0–10.5)
nRBC: 0 % (ref 0.0–0.2)

## 2020-07-06 LAB — IRON AND TIBC
Iron: 23 ug/dL — ABNORMAL LOW (ref 45–182)
Saturation Ratios: 5 % — ABNORMAL LOW (ref 17.9–39.5)
TIBC: 476 ug/dL — ABNORMAL HIGH (ref 250–450)
UIBC: 453 ug/dL

## 2020-07-06 LAB — FERRITIN: Ferritin: 7 ng/mL — ABNORMAL LOW (ref 24–336)

## 2020-07-06 LAB — VITAMIN D 25 HYDROXY (VIT D DEFICIENCY, FRACTURES): Vit D, 25-Hydroxy: 21.76 ng/mL — ABNORMAL LOW (ref 30–100)

## 2020-07-06 LAB — VITAMIN B12: Vitamin B-12: 254 pg/mL (ref 180–914)

## 2020-07-06 LAB — SEDIMENTATION RATE: Sed Rate: 10 mm/hr (ref 0–16)

## 2020-07-06 NOTE — Assessment & Plan Note (Signed)
The patient is profoundly anemic He had received blood transfusions in the past He has significant symptoms of anemia and I think it is appropriate to proceed with intravenous iron infusion with a goal to correct his anemia and iron deficiency and avoid transfusion support I have directed the patient to stop using aspirin containing products such as BC powder and using NSAID I will schedule II doses of IV iron Feraheme and then plan to see him back in the future for further follow-up  The most likely cause of his anemia is due to chronic blood loss/malabsorption syndrome. We discussed some of the risks, benefits, and alternatives of intravenous iron infusions. The patient is symptomatic from anemia and the iron level is critically low. He tolerated oral iron supplement poorly and desires to achieved higher levels of iron faster for adequate hematopoesis. Some of the side-effects to be expected including risks of infusion reactions, phlebitis, headaches, nausea and fatigue.  The patient is willing to proceed. Patient education material was dispensed.  Goal is to keep ferritin level greater than 50 and resolution of anemia

## 2020-07-06 NOTE — Progress Notes (Signed)
[facility] CONSULT NOTE  Patient Care Team: Jearld Lesch, MD as PCP - General (Family Medicine) Thomasene Ripple, DO as PCP - Cardiology (Cardiology)  CHIEF COMPLAINTS/PURPOSE OF CONSULTATION:  Severe iron deficiency anemia  HISTORY OF PRESENTING ILLNESS:  Vincent Guerrero 60 y.o. male is here because of recurrent severe iron deficiency anemia  He was found to have abnormal CBC from severe anemia from recent blood work I have the opportunity to review his CBC dated back from May 31, 2016 His hemoglobin ranged from 7.3-12.5, always with low MCV Last year, he presented with severe anemia requiring blood transfusion He underwent EGD and colonoscopy  His last colonoscopy was on November 27, 2019 His colonoscopy was normal but EGD revealed nonbleeding gastric ulcers  He complained of recent chest pain on exertion, shortness of breath on minimal exertion, pre-syncopal episodes, leg cramps and palpitations. He had not noticed any recent bleeding such as epistaxis, hematuria or hematochezia The patient denies over the counter NSAID ingestion. He is takes [facility] powder on a regular basis for chronic headaches.  He had no prior history or diagnosis of cancer. His age appropriate screening programs are up-to-date. He denies any pica and eats a variety of diet. He never donated blood  The patient was prescribed oral iron supplements and he takes daily but it is not helping He usually take oral iron supplement with food He complains of diffuse joint pain and bone pain on a regular basis He smoke on a regular basis, 1ppd  MEDICAL HISTORY:  Past Medical History:  Diagnosis Date  . Age-related nuclear cataract of both eyes 11/27/2018  . Anemia   . Bilateral recurrent inguinal hernia without obstruction or gangrene 05/09/2016   Added automatically from request for surgery [id number]  . Chest pressure 11/06/2019  . Coronary artery disease involving native artery of transplanted  heart without angina pectoris   . Cortical age-related cataract of both eyes 11/27/2018  . Depression   . Diabetes mellitus (HCC)   . Hyperlipidemia LDL goal <70   . Hypertension   . Inguinal hernia without obstruction or gangrene    BILATERAL  . Primary open angle glaucoma (POAG) of both eyes, severe stage 11/27/2018  . Primary open angle glaucoma (POAG) of left eye, severe stage 11/27/2018  . Tobacco abuse 11/05/2019  . Tobacco use 11/06/2019    SURGICAL HISTORY: Past Surgical History:  Procedure Laterality Date  . BIOPSY  11/27/2019   Procedure: BIOPSY;  Surgeon: Sherrilyn Rist, MD;  Location: [facility] ENDOSCOPY;  Service: Gastroenterology;;  . COLONOSCOPY WITH PROPOFOL N/A 11/27/2019   Procedure: COLONOSCOPY WITH PROPOFOL;  Surgeon: Sherrilyn Rist, MD;  Location: [facility] ENDOSCOPY;  Service: Gastroenterology;  Laterality: N/A;  . ESOPHAGOGASTRODUODENOSCOPY (EGD) WITH PROPOFOL N/A 11/27/2019   Procedure: ESOPHAGOGASTRODUODENOSCOPY (EGD) WITH PROPOFOL;  Surgeon: Sherrilyn Rist, MD;  Location: [facility] ENDOSCOPY;  Service: Gastroenterology;  Laterality: N/A;  . HEMOSTASIS CLIP PLACEMENT  11/27/2019   Procedure: HEMOSTASIS CLIP PLACEMENT;  Surgeon: Sherrilyn Rist, MD;  Location: [facility] ENDOSCOPY;  Service: Gastroenterology;;  . HERNIA REPAIR  1998  . LEFT HEART CATH AND CORONARY ANGIOGRAPHY N/A 06/04/2016   Procedure: Left Heart Cath and Coronary Angiography;  Surgeon: Lyn Records, MD;  Location: [facility] INVASIVE CV LAB;  Service: Cardiovascular;  Laterality: N/A;    SOCIAL HISTORY: Social History   Socioeconomic History  . Marital status: Legally Separated    Spouse name: Not on file  . Number of children:  Not on file  . Years of education: Not on file  . Highest education level: Not on file  Occupational History  . Not on file  Tobacco Use  . Smoking status: Current Every Day Smoker    Packs/day: 1.00    Years: 40.00    Pack years: 40.00    Types: Cigarettes  . Smokeless  tobacco: Current User    Types: Chew  . Tobacco comment: half pack daily-05/05/2020-AH  Vaping Use  . Vaping Use: Never used  Substance and Sexual Activity  . Alcohol use: No  . Drug use: Yes    Types: Marijuana  . Sexual activity: Yes  Other Topics Concern  . Not on file  Social History Narrative  . Not on file   Social Determinants of Health   Financial Resource Strain: Not on file  Food Insecurity: Not on file  Transportation Needs: Not on file  Physical Activity: Not on file  Stress: Not on file  Social Connections: Not on file  Intimate Partner Violence: Not on file    FAMILY HISTORY: Family History  Problem Relation Age of Onset  . Heart failure Mother   . Hypertension Mother   . CAD Father   . Diabetes Father   . Prostate cancer Brother     ALLERGIES:  has No Known Allergies.  MEDICATIONS:  Current Outpatient Medications  Medication Sig Dispense Refill  . glipiZIDE (GLUCOTROL) 10 MG tablet Take 10 mg by mouth 2 (two) times daily before a meal.     . IRON, FERROUS SULFATE, PO Take 65 mg by mouth daily. Takes SV iron 65mg  by prescription.    lisinopril (PRINIVIL,ZESTRIL) 20 MG tablet Take 1 tablet (20 mg total) by mouth 2 (two) times daily. 180 tablet 2  . metFORMIN (GLUCOPHAGE) 1000 MG tablet Take 1,000 mg by mouth 2 (two) times daily with a meal.    . metoprolol (LOPRESSOR) 50 MG tablet Take 1 tablet (50 mg total) by mouth 2 (two) times daily. 180 tablet 2  . pantoprazole (PROTONIX) 40 MG tablet Take 1 tablet (40 mg total) by mouth daily. 90 tablet 3  . ranolazine (RANEXA) 500 MG 12 hr tablet Take 500 mg by mouth 2 (two) times daily.    . nitroGLYCERIN (NITROSTAT) 0.4 MG SL tablet Place 1 tablet (0.4 mg total) under the tongue every 5 (five) minutes as needed for chest pain. 90 tablet 3   No current facility-administered medications for this visit.    REVIEW OF SYSTEMS:   Constitutional: Denies fevers, chills or abnormal night sweats Eyes: Denies  blurriness of vision, double vision or watery eyes Ears, nose, mouth, throat, and face: Denies mucositis or sore throat Respiratory: Denies cough, dyspnea or wheezes Cardiovascular: Denies palpitation, chest discomfort or lower extremity swelling Skin: Denies abnormal skin rashes Lymphatics: Denies new lymphadenopathy or easy bruising Neurological:Denies numbness, tingling or new weaknesses Behavioral/Psych: Mood is stable, no new changes  All other systems were reviewed with the patient and are negative.  PHYSICAL EXAMINATION: ECOG PERFORMANCE STATUS: 1 - Symptomatic but completely ambulatory  Vitals:   07/06/20 0945  BP: 140/73  Pulse: 67  Resp: 18  Temp: 98.4 F (36.9 C)  SpO2: 99%   Filed Weights   07/06/20 0945  Weight: 204 lb 12 oz (92.9 kg)    GENERAL:alert, no distress and comfortable SKIN: skin color, texture, turgor are normal, no rashes or significant lesions EYES: normal, conjunctiva are pink and non-injected, sclera clear OROPHARYNX:no exudate, no erythema and  lips, buccal mucosa, and tongue normal  NECK: supple, thyroid normal size, non-tender, without nodularity LYMPH:  no palpable lymphadenopathy in the cervical, axillary or inguinal LUNGS: clear to auscultation and percussion with normal breathing effort HEART: regular rate & rhythm ejection systolic murmur, no lower extremity edema ABDOMEN:abdomen soft, non-tender and normal bowel sounds Musculoskeletal:no cyanosis of digits and no clubbing  PSYCH: alert & oriented x 3 with fluent speech NEURO: no focal motor/sensory deficits  RADIOGRAPHIC STUDIES: I have personally reviewed the radiological images as listed and agreed with the findings in the report. DG Chest Portable 1 View  Result Date: 06/10/2020 CLINICAL DATA:  Shortness of breath and weakness. EXAM: PORTABLE CHEST 1 VIEW COMPARISON:  November 25, 2019 FINDINGS: The heart size and mediastinal contours are within normal limits. Both lungs are clear. The  visualized skeletal structures are unremarkable. IMPRESSION: No active disease. Electronically Signed   By: Aram Candela M.D.   On: 06/10/2020 23:38   ECHOCARDIOGRAM COMPLETE  Result Date: 06/07/2020    ECHOCARDIOGRAM REPORT   Patient Name:   EDWING FIGLEY Date of Exam: 06/07/2020 Medical Rec #:  [id number]           Height:       70.0 in Accession #:    [id number]          Weight:       205.4 lb Date of Birth:  [birth year]          BSA:          2.111 m Patient Age:    59 years            BP:           112/60 mmHg Patient Gender: M                   HR:           61 bpm. Exam Location:   Procedure: 2D Echo Indications:    Shortness of breath [R06.02 (ICD-10-CM)]; Wheezing [R06.2                 (ICD-10-CM)]; Aortic valve stenosis, etiology of cardiac valve                 disease unspecified [I35.0 (ICD-10-CM)]  History:        Patient has no prior history of Echocardiogram examinations.                 CAD; Risk Factors:Hypertension, Diabetes, Dyslipidemia and                 Tobacco abuse.  Sonographer:    Louie Boston Referring Phys: [id number] KARDIE TOBB IMPRESSIONS  1. Left ventricular ejection fraction, by estimation, is 55 to 60%. The left ventricle has normal function. The left ventricle has no regional wall motion abnormalities. There is severe concentric left ventricular hypertrophy. Left ventricular diastolic  parameters are consistent with Grade III diastolic dysfunction (restrictive).  2. Right ventricular systolic function is normal. The right ventricular size is normal. There is normal pulmonary artery systolic pressure.  3. Left atrial size was mildly dilated.  4. The mitral valve is normal in structure. No evidence of mitral valve regurgitation. No evidence of mitral stenosis.  5. There is a small (1.81 x 1.46 cm) mass on the posterior leaflet of the tricuspid valve, best seen in the subcostal view (clip 64), which appears to be present on previous echo done in 10/2019 (clip 68). May  need  further cardiac imaging study (cardiac MRI) for better visualization.  6. The aortic valve is calcified. There is moderate calcification of the aortic valve. Aortic valve regurgitation is mild to moderate. Moderate to severe aortic valve stenosis.  7. 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  8. The inferior vena cava is normal in size with greater than 50% respiratory variability, suggesting right atrial pressure of 3 mmHg. Comparison(s): A prior study was performed on 10/2019. No significant change from prior study. FINDINGS  Left Ventricle: Left ventricular ejection fraction, by estimation, is 55 to 60%. The left ventricle has normal function. The left ventricle has no regional wall motion abnormalities. Global longitudinal strain performed but not reported based on interpreter judgement due to suboptimal tracking. The left ventricular internal cavity size was normal in size. There is severe concentric left ventricular hypertrophy. Left ventricular diastolic parameters are consistent with Grade III diastolic dysfunction (restrictive). Right Ventricle: The right ventricular size is normal. No increase in right ventricular wall thickness. Right ventricular systolic function is normal. There is normal pulmonary artery systolic pressure. The tricuspid regurgitant velocity is 2.10 m/s, and  with an assumed right atrial pressure of 3 mmHg, the estimated right ventricular systolic pressure is 20.6 mmHg. Left Atrium: Left atrial size was mildly dilated. Right Atrium: Right atrial size was normal in size. Pericardium: There is no evidence of pericardial effusion. Mitral Valve: The mitral valve is normal in structure. Mild mitral annular calcification. No evidence of mitral valve regurgitation. No evidence of mitral valve stenosis. 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 May need further cardiac imaging study (cardiac MRI) for better visualization. The tricuspid valve is normal in structure. Tricuspid valve regurgitation is trivial. No evidence of tricuspid stenosis. Aortic Valve: The aortic valve is calcified. There is moderate calcification of the aortic valve. There is mild aortic valve annular calcification. Aortic valve regurgitation is mild to moderate. Aortic regurgitation PHT measures 609 msec. Moderate to severe aortic stenosis is present. Aortic valve mean gradient measures 43.0 mmHg. Aortic valve peak gradient measures 70.6 mmHg. Aortic valve area, by VTI measures 1.25 cm. Pulmonic Valve: The pulmonic valve was normal in structure. Pulmonic valve regurgitation is not visualized. No evidence of pulmonic stenosis. 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 The aortic root is normal in size and structure. Venous: The inferior vena cava is normal in size with greater than 50% respiratory variability, suggesting right atrial pressure of 3 mmHg. IAS/Shunts: No atrial level shunt detected by color flow Doppler.  LEFT VENTRICLE PLAX 2D LVIDd:         5.00 cm  Diastology LVIDs:         2.40 cm  LV e' medial:    5.11 cm/s LV PW:         1.90 cm  LV E/e' medial:  20.7 LV IVS:        1.90 cm  LV e' lateral:   7.51 cm/s LVOT diam:     2.40 cm  LV E/e' lateral: 14.1 LV SV:         126 LV SV Index:   60 LVOT Area:     4.52 cm  RIGHT VENTRICLE             IVC RV S prime:     13.30 cm/s  IVC diam: 1.40 cm  TAPSE (M-mode): 3.0 cm LEFT ATRIUM             Index       RIGHT ATRIUM           Index LA diam:        3.70 cm 1.75 cm/m  RA Area:     14.50 cm LA Vol (A2C):   99.9 ml 47.32 ml/m RA Volume:   32.60 ml  15.44 ml/m LA Vol (A4C):   57.3 ml 27.14 ml/m LA Biplane Vol: 78.6 ml 37.23 ml/m  AORTIC VALVE AV Area (Vmax):    1.25 cm AV Area (Vmean):   1.09 cm AV Area (VTI):     1.25 cm AV Vmax:           420.00 cm/s AV Vmean:          306.000 cm/s AV VTI:             1.010 m AV Peak Grad:      70.6 mmHg AV Mean Grad:      43.0 mmHg LVOT Vmax:         116.00 cm/s LVOT Vmean:        74.000 cm/s LVOT VTI:          0.278 m LVOT/AV VTI ratio: 0.28 AI PHT:            609 msec  AORTA Ao Root diam: 3.50 cm Ao Asc diam:  3.30 cm Ao Desc diam: 2.25 cm MITRAL VALVE                TRICUSPID VALVE MV Area (PHT): 2.54 cm     TR Peak grad:   17.6 mmHg MV Decel Time: 299 msec     TR Vmax:        210.00 cm/s MV E velocity: 106.00 cm/s MV A velocity: 125.00 cm/s  SHUNTS MV E/A ratio:  0.85         Systemic VTI:  0.28 m                             Systemic Diam: 2.40 cm Kardie Tobb DO Electronically signed by Thomasene RippleKardie Tobb DO Signature Date/Time: 06/07/2020/6:35:38 PM    Final     ASSESSMENT & PLAN:  Iron deficiency anemia due to chronic blood loss The patient is profoundly anemic He had received blood transfusions in the past He has significant symptoms of anemia and I think it is appropriate to proceed with intravenous iron infusion with a goal to correct his anemia and iron deficiency and avoid transfusion support I have directed the patient to stop using aspirin containing products such as BC powder and using NSAID I will schedule II doses of IV iron Feraheme and then plan to see him back in the future for further follow-up  The most likely cause of his anemia is due to chronic blood loss/malabsorption syndrome. We discussed some of the risks, benefits, and alternatives of intravenous iron infusions. The patient is symptomatic from anemia and the iron level is critically low. He tolerated oral iron supplement poorly and desires to achieved higher levels of iron faster for adequate hematopoesis. Some of the side-effects to be expected including risks of infusion reactions, phlebitis, headaches, nausea and fatigue.  The patient is willing to proceed. Patient education material was dispensed.  Goal is to keep ferritin level greater than 50 and resolution of anemia   Tobacco abuse We  discussed the importance of nicotine cessation due to high risk of gastritis and osteoporosis  Vitamin D deficiency He has diffuse joint pain I suspect he might have vitamin D deficiency I will call him with test results  Gastritis His upper endoscopy evaluation confirm gastric ulcer  Orders Placed This Encounter  Procedures  . CBC with Differential/Platelet    Standing Status:   Future    Number of Occurrences:   1    Standing Expiration Date:   07/06/2021  . Ferritin    Standing Status:   Future    Number of Occurrences:   1    Standing Expiration Date:   07/06/2021  . Iron and TIBC    Standing Status:   Future    Number of Occurrences:   1    Standing Expiration Date:   07/06/2021  . Vitamin B12    Standing Status:   Future    Number of Occurrences:   1    Standing Expiration Date:   07/06/2021  . Sedimentation rate    Standing Status:   Future    Number of Occurrences:   1    Standing Expiration Date:   07/06/2021  . VITAMIN D 25 Hydroxy (Vit-D Deficiency, Fractures)    Standing Status:   Future    Number of Occurrences:   1    Standing Expiration Date:   07/06/2021  . TSH    Standing Status:   Future    Number of Occurrences:   1    Standing Expiration Date:   07/06/2021  . Iron and TIBC    Standing Status:   Future    Standing Expiration Date:   07/06/2021  . Sedimentation rate    Standing Status:   Future    Standing Expiration Date:   07/06/2021  . Vitamin B12    Standing Status:   Future    Standing Expiration Date:   07/06/2021  . Ferritin    Standing Status:   Future    Standing Expiration Date:   07/06/2021  . CBC with Differential/Platelet    Standing Status:   Future    Standing Expiration Date:   07/06/2021    All questions were answered. The patient knows to call the clinic with any problems, questions or concerns.  The total time spent in the appointment was 55 minutes encounter with patients including review of chart and various tests results, discussions about  plan of care and coordination of care plan  Artis Delay, MD 6/1/20223:14 PM       Artis Delay, MD 07/06/20 3:14 PM

## 2020-07-06 NOTE — Assessment & Plan Note (Signed)
He has diffuse joint pain I suspect he might have vitamin D deficiency I will call him with test results

## 2020-07-06 NOTE — Assessment & Plan Note (Signed)
We discussed the importance of nicotine cessation due to high risk of gastritis and osteoporosis

## 2020-07-06 NOTE — Assessment & Plan Note (Signed)
His upper endoscopy evaluation confirm gastric ulcer

## 2020-07-08 ENCOUNTER — Other Ambulatory Visit: Payer: Self-pay | Admitting: Pharmacist

## 2020-07-11 ENCOUNTER — Encounter: Payer: Self-pay | Admitting: Hematology and Oncology

## 2020-07-12 ENCOUNTER — Inpatient Hospital Stay: Payer: Medicaid Other

## 2020-07-12 ENCOUNTER — Other Ambulatory Visit: Payer: Self-pay

## 2020-07-12 VITALS — BP 135/63 | HR 81 | Temp 98.1°F | Resp 18 | Ht 70.0 in | Wt 206.0 lb

## 2020-07-12 DIAGNOSIS — D500 Iron deficiency anemia secondary to blood loss (chronic): Secondary | ICD-10-CM | POA: Diagnosis not present

## 2020-07-12 MED ORDER — SODIUM CHLORIDE 0.9 % IV SOLN
510.0000 mg | Freq: Once | INTRAVENOUS | Status: AC
  Administered 2020-07-12: 510 mg via INTRAVENOUS
  Filled 2020-07-12: qty 17

## 2020-07-12 MED ORDER — SODIUM CHLORIDE 0.9 % IV SOLN
Freq: Once | INTRAVENOUS | Status: AC
  Filled 2020-07-12: qty 250

## 2020-07-12 NOTE — Patient Instructions (Signed)
Ferumoxytol injection What is this medicine? FERUMOXYTOL is an iron complex. Iron is used to make healthy red blood cells, which carry oxygen and nutrients throughout the body. This medicine is used to treat iron deficiency anemia. This medicine may be used for other purposes; ask your health care provider or pharmacist if you have questions. COMMON BRAND NAME(S): Feraheme What should I tell my health care provider before I take this medicine? They need to know if you have any of these conditions:  anemia not caused by low iron levels  high levels of iron in the blood  magnetic resonance imaging (MRI) test scheduled  an unusual or allergic reaction to iron, other medicines, foods, dyes, or preservatives  pregnant or trying to get pregnant  breast-feeding How should I use this medicine? This medicine is for injection into a vein. It is given by a health care professional in a hospital or clinic setting. Talk to your pediatrician regarding the use of this medicine in children. Special care may be needed. Overdosage: If you think you have taken too much of this medicine contact a poison control center or emergency room at once. NOTE: This medicine is only for you. Do not share this medicine with others. What if I miss a dose? It is important not to miss your dose. Call your doctor or health care professional if you are unable to keep an appointment. What may interact with this medicine? This medicine may interact with the following medications:  other iron products This list may not describe all possible interactions. Give your health care provider a list of all the medicines, herbs, non-prescription drugs, or dietary supplements you use. Also tell them if you smoke, drink alcohol, or use illegal drugs. Some items may interact with your medicine. What should I watch for while using this medicine? Visit your doctor or healthcare professional regularly. Tell your doctor or healthcare  professional if your symptoms do not start to get better or if they get worse. You may need blood work done while you are taking this medicine. You may need to follow a special diet. Talk to your doctor. Foods that contain iron include: whole grains/cereals, dried fruits, beans, or peas, leafy green vegetables, and organ meats (liver, kidney). What side effects may I notice from receiving this medicine? Side effects that you should report to your doctor or health care professional as soon as possible:  allergic reactions like skin rash, itching or hives, swelling of the face, lips, or tongue  breathing problems  changes in blood pressure  feeling faint or lightheaded, falls  fever or chills  flushing, sweating, or hot feelings  swelling of the ankles or feet Side effects that usually do not require medical attention (report to your doctor or health care professional if they continue or are bothersome):  diarrhea  headache  nausea, vomiting  stomach pain This list may not describe all possible side effects. Call your doctor for medical advice about side effects. You may report side effects to FDA at 1-800-FDA-1088. Where should I keep my medicine? This drug is given in a hospital or clinic and will not be stored at home. NOTE: This sheet is a summary. It may not cover all possible information. If you have questions about this medicine, talk to your doctor, pharmacist, or health care provider.  2021 [employer] (2016-03-12 20:21:10)  

## 2020-07-14 ENCOUNTER — Encounter: Payer: Self-pay | Admitting: Cardiology

## 2020-07-18 ENCOUNTER — Encounter: Payer: Self-pay | Admitting: Hematology and Oncology

## 2020-07-19 ENCOUNTER — Other Ambulatory Visit: Payer: Self-pay

## 2020-07-19 ENCOUNTER — Inpatient Hospital Stay: Payer: Medicaid Other

## 2020-07-19 VITALS — BP 150/73 | HR 93 | Temp 98.4°F | Resp 18 | Ht 70.0 in | Wt 199.6 lb

## 2020-07-19 DIAGNOSIS — D500 Iron deficiency anemia secondary to blood loss (chronic): Secondary | ICD-10-CM | POA: Diagnosis not present

## 2020-07-19 MED ORDER — SODIUM CHLORIDE 0.9 % IV SOLN
Freq: Once | INTRAVENOUS | Status: AC
  Filled 2020-07-19: qty 250

## 2020-07-19 MED ORDER — SODIUM CHLORIDE 0.9 % IV SOLN
510.0000 mg | Freq: Once | INTRAVENOUS | Status: AC
  Administered 2020-07-19: 510 mg via INTRAVENOUS
  Filled 2020-07-19: qty 510

## 2020-07-19 NOTE — Patient Instructions (Signed)
Ferumoxytol injection What is this medication? FERUMOXYTOL is an iron complex. Iron is used to make healthy red blood cells, which carry oxygen and nutrients throughout the body. This medicine is used totreat iron deficiency anemia. This medicine may be used for other purposes; ask your health care provider orpharmacist if you have questions. COMMON BRAND NAME(S): Feraheme What should I tell my care team before I take this medication? They need to know if you have any of these conditions: anemia not caused by low iron levels high levels of iron in the blood magnetic resonance imaging (MRI) test scheduled an unusual or allergic reaction to iron, other medicines, foods, dyes, or preservatives pregnant or trying to get pregnant breast-feeding How should I use this medication? This medicine is for injection into a vein. It is given by a health careprofessional in a hospital or clinic setting. Talk to your pediatrician regarding the use of this medicine in children.Special care may be needed. Overdosage: If you think you have taken too much of this medicine contact apoison control center or emergency room at once. NOTE: This medicine is only for you. Do not share this medicine with others. What if I miss a dose? It is important not to miss your dose. Call your doctor or health careprofessional if you are unable to keep an appointment. What may interact with this medication? This medicine may interact with the following medications: other iron products This list may not describe all possible interactions. Give your health care provider a list of all the medicines, herbs, non-prescription drugs, or dietary supplements you use. Also tell them if you smoke, drink alcohol, or use illegaldrugs. Some items may interact with your medicine. What should I watch for while using this medication? Visit your doctor or healthcare professional regularly. Tell your doctor or healthcare professional if your  symptoms do not start to get better or if theyget worse. You may need blood work done while you are taking this medicine. You may need to follow a special diet. Talk to your doctor. Foods that contain iron include: whole grains/cereals, dried fruits, beans, or peas, leafy greenvegetables, and organ meats (liver, kidney). What side effects may I notice from receiving this medication? Side effects that you should report to your doctor or health care professionalas soon as possible: allergic reactions like skin rash, itching or hives, swelling of the face, lips, or tongue breathing problems changes in blood pressure feeling faint or lightheaded, falls fever or chills flushing, sweating, or hot feelings swelling of the ankles or feet Side effects that usually do not require medical attention (report to yourdoctor or health care professional if they continue or are bothersome): diarrhea headache nausea, vomiting stomach pain This list may not describe all possible side effects. Call your doctor for medical advice about side effects. You may report side effects to FDA at1-800-FDA-1088. Where should I keep my medication? This drug is given in a hospital or clinic and will not be stored at home. NOTE: This sheet is a summary. It may not cover all possible information. If you have questions about this medicine, talk to your doctor, pharmacist, orhealth care provider.  2022 [employer] Standard (2016-03-12 20:21:10)  

## 2020-07-29 ENCOUNTER — Ambulatory Visit: Payer: Medicaid Other | Admitting: Cardiology

## 2020-08-01 ENCOUNTER — Other Ambulatory Visit: Payer: Self-pay

## 2020-08-01 ENCOUNTER — Ambulatory Visit: Payer: Medicaid Other | Admitting: Cardiology

## 2020-08-01 ENCOUNTER — Encounter: Payer: Self-pay | Admitting: Cardiology

## 2020-08-01 VITALS — BP 142/78 | HR 74 | Ht 70.0 in | Wt 200.8 lb

## 2020-08-01 DIAGNOSIS — E785 Hyperlipidemia, unspecified: Secondary | ICD-10-CM

## 2020-08-01 DIAGNOSIS — E0800 Diabetes mellitus due to underlying condition with hyperosmolarity without nonketotic hyperglycemic-hyperosmolar coma (NKHHC): Secondary | ICD-10-CM

## 2020-08-01 DIAGNOSIS — I2510 Atherosclerotic heart disease of native coronary artery without angina pectoris: Secondary | ICD-10-CM

## 2020-08-01 DIAGNOSIS — Z7689 Persons encountering health services in other specified circumstances: Secondary | ICD-10-CM

## 2020-08-01 DIAGNOSIS — I350 Nonrheumatic aortic (valve) stenosis: Secondary | ICD-10-CM

## 2020-08-01 DIAGNOSIS — Z720 Tobacco use: Secondary | ICD-10-CM

## 2020-08-01 DIAGNOSIS — I351 Nonrheumatic aortic (valve) insufficiency: Secondary | ICD-10-CM

## 2020-08-01 MED ORDER — VALSARTAN 40 MG PO TABS: 40.0000 mg | ORAL_TABLET | Freq: Every day | ORAL | 3 refills | Status: DC

## 2020-08-01 NOTE — Addendum Note (Signed)
Addended by: Reynolds Bowl on: 08/01/2020 09:57 AM   Modules accepted: Orders

## 2020-08-01 NOTE — Progress Notes (Addendum)
Cardiology Office Note:    Date:  08/24/2020   ID:  Vincent Guerrero, DOB 1960/12/04, MRN [id number]  PCP:  Jearld Lesch, MD  Cardiologist:  Thomasene Ripple, DO  Electrophysiologist:  None   Referring MD: Simone Curia, MD   No chief complaint on file. I am doing fine  History of Present Illness:    [name] is a 60 y.o. male with a hx of coronary artery disease status post stent in the RCA a result of an MI in 2010, hyperlipidemia (not on statins due to statin intolerance), diabetes mellitus (on Metformin and glipizide),, current smoker CVA in 2011, moderate aortic stenosis in April 2018.    In October the patient was short of breath were concerned that this may have been coronary artery disease therefore I set the patient up for a left heart catheterization but unfortunately his hemoglobin was significantly low.    He was seen by with GI he has had a colonoscopy which showed some ulcers.     When I saw the patient in March 2022 he had stopped his Repatha as well as his Imdur.  He felt both of these medication was playing with his mind.  I therefore placed the patient on Ranexa to help with any angina symptoms.  He had some wheezing that they and I referred the patient to pulmonary as he was not treated for likely undiagnosed COPD given his long smoking history.  His blood pressure was also elevated that day when asked the patient said he had not taken his blood pressure medication.   I saw the patient on June 02, 2020 he had complained of some shortness of breath and chest tightness at the time my office he has been wheezing.  He reported he had not been using his inhalers.  Placed a referral for pulmonary as well.  During that visit he had not been taking the Ranexa as recommended and he had previously stopped the Imdur.  His hemoglobin was also low during that visit which appeared to be dropping.  And he had stopped the Protonix.  The patient was here with his fiance that  day I explained to him that for his angina he needed to stay on the Ranexa and also hopefully if he could tolerate the Imdur.  I restarted on the Ranexa at that visit.  I also explained to the patient that giving his history of GI bleeding he would need to stay on the Protonix.     I saw the patient in May 2022 at that time we will set up for him to see GI as well as hematology.  He missed his GI appointment but was able to see hematology and has been started on iron treatment.  He says that the shortness of breath has improved.  Last week he ended up in the emergency department at [facility] where his troponins was negative.  He was noted to have musculoskeletal pain he was given some lidocaine and Tylenol which helped his pain.  Today he offers no complaints.  He is looking for a new primary care doctor.  Past Medical History:  Diagnosis Date   Age-related nuclear cataract of both eyes 11/27/2018   Anemia    Bilateral recurrent inguinal hernia without obstruction or gangrene 05/09/2016   Added automatically from request for surgery [id number]   Chest pressure 11/06/2019   Coronary artery disease involving native artery of transplanted heart without angina pectoris    Cortical age-related cataract of  both eyes 11/27/2018   Depression    Diabetes mellitus (HCC)    Hyperlipidemia LDL goal <70    Hypertension    Inguinal hernia without obstruction or gangrene    BILATERAL   Primary open angle glaucoma (POAG) of both eyes, severe stage 11/27/2018   Primary open angle glaucoma (POAG) of left eye, severe stage 11/27/2018   Tobacco abuse 11/05/2019   Tobacco use 11/06/2019    Past Surgical History:  Procedure Laterality Date   BIOPSY  11/27/2019   Procedure: BIOPSY;  Surgeon: Danis, Henry L III, MD;  Location: [facility] ENDOSCOPY;  Service: Gastroenterology;;   COLONOSCOPY WITH PROPOFOL N/A 11/27/2019   Procedure: COLONOSCOPY WITH PROPOFOL;  Surgeon: Danis, Henry L III, MD;  Location: [facility] ENDOSCOPY;  Service:  Gastroenterology;  Laterality: N/A;   ESOPHAGOGASTRODUODENOSCOPY (EGD) WITH PROPOFOL N/A 11/27/2019   Procedure: ESOPHAGOGASTRODUODENOSCOPY (EGD) WITH PROPOFOL;  Surgeon: Danis, Henry L III, MD;  Location: [facility] ENDOSCOPY;  Service: Gastroenterology;  Laterality: N/A;   HEMOSTASIS CLIP PLACEMENT  11/27/2019   Procedure: HEMOSTASIS CLIP PLACEMENT;  Surgeon: Danis, Henry L III, MD;  Location: [facility] ENDOSCOPY;  Service: Gastroenterology;;   HERNIA REPAIR  1998   LEFT HEART CATH AND CORONARY ANGIOGRAPHY N/A 06/04/2016   Procedure: Left Heart Cath and Coronary Angiography;  Surgeon: Henry W Smith, MD;  Location: [facility] INVASIVE CV LAB;  Service: Cardiovascular;  Laterality: N/A;    Current Medications: Current Meds  Medication Sig   glipiZIDE (GLUCOTROL) 10 MG tablet Take 10 mg by mouth 2 (two) times daily before a meal.    IRON, FERROUS SULFATE, PO Take 65 mg by mouth daily. Takes SV iron 65mg by prescription.   metFORMIN (GLUCOPHAGE) 1000 MG tablet Take 1,000 mg by mouth 2 (two) times daily with a meal.   metoprolol (LOPRESSOR) 50 MG tablet Take 1 tablet (50 mg total) by mouth 2 (two) times daily.   nitroGLYCERIN (NITROSTAT) 0.4 MG SL tablet Place 1 tablet (0.4 mg total) under the tongue every 5 (five) minutes as needed for chest pain.   pantoprazole (PROTONIX) 40 MG tablet Take 1 tablet (40 mg total) by mouth daily.   ranolazine (RANEXA) 500 MG 12 hr tablet Take 500 mg by mouth 2 (two) times daily.   valsartan (DIOVAN) 40 MG tablet Take 1 tablet (40 mg total) by mouth daily.   [DISCONTINUED] lisinopril (PRINIVIL,ZESTRIL) 20 MG tablet Take 1 tablet (20 mg total) by mouth 2 (two) times daily.     Allergies:   Patient has no known allergies.   Social History   Socioeconomic History   Marital status: Legally Separated    Spouse name: Not on file   Number of children: Not on file   Years of education: Not on file   Highest education level: Not on file  Occupational History   Not on file  Tobacco  Use   Smoking status: Every Day    Packs/day: 1.00    Years: 40.00    Pack years: 40.00    Types: Cigarettes   Smokeless tobacco: Current    Types: Chew   Tobacco comments:    half pack daily-05/05/2020-AH  Vaping Use   Vaping Use: Never used  Substance and Sexual Activity   Alcohol use: No   Drug use: Yes    Types: Marijuana   Sexual activity: Yes  Other Topics Concern   Not on file  Social History Narrative   Not on file   Social Determinants of Health   Financial Resource Strain:   Not on file  Food Insecurity: Not on file  Transportation Needs: Not on file  Physical Activity: Not on file  Stress: Not on file  Social Connections: Not on file     Family History: The patient's family history includes CAD in his father; Diabetes in his father; Heart failure in his mother; Hypertension in his mother; Prostate cancer in his brother.  ROS:   Review of Systems  Constitution: Negative for decreased appetite, fever and weight gain.  HENT: Negative for congestion, ear discharge, hoarse voice and sore throat.   Eyes: Negative for discharge, redness, vision loss in right eye and visual halos.  Cardiovascular: Negative for chest pain, dyspnea on exertion, leg swelling, orthopnea and palpitations.  Respiratory: Negative for cough, hemoptysis, shortness of breath and snoring.   Endocrine: Negative for heat intolerance and polyphagia.  Hematologic/Lymphatic: Negative for bleeding problem. Does not bruise/bleed easily.  Skin: Negative for flushing, nail changes, rash and suspicious lesions.  Musculoskeletal: Negative for arthritis, joint pain, muscle cramps, myalgias, neck pain and stiffness.  Gastrointestinal: Negative for abdominal pain, bowel incontinence, diarrhea and excessive appetite.  Genitourinary: Negative for decreased libido, genital sores and incomplete emptying.  Neurological: Negative for brief paralysis, focal weakness, headaches and loss of balance.   Psychiatric/Behavioral: Negative for altered mental status, depression and suicidal ideas.  Allergic/Immunologic: Negative for HIV exposure and persistent infections.    EKGs/Labs/Other Studies Reviewed:    The following studies were reviewed today:   EKG: None today  Transthoracic echocardiogram done Jun 07, 2020 IMPRESSIONS   1. Left ventricular ejection fraction, by estimation, is 55 to 60%. The  left ventricle has normal function. The left ventricle has no regional  wall motion abnormalities. There is severe concentric left ventricular  hypertrophy. Left ventricular diastolic   parameters are consistent with Grade III diastolic dysfunction  (restrictive).   2. Right ventricular systolic function is normal. The right ventricular  size is normal. There is normal pulmonary artery systolic pressure.   3. Left atrial size was mildly dilated.   4. The mitral valve is normal in structure. No evidence of mitral valve  regurgitation. No evidence of mitral stenosis.   5. There is a small (1.81 x 1.46 cm) mass on the posterior leaflet of the  tricuspid valve, best seen in the subcostal view (clip 64), which appears  to be present on previous echo done in 10/2019 (clip 68). May need further  cardiac imaging study (cardiac  MRI) for better visualization.   6. The aortic valve is calcified. There is moderate calcification of the  aortic valve. Aortic valve regurgitation is mild to moderate. Moderate to  severe aortic valve stenosis.   7. 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   8. The inferior vena cava is normal in size with greater than 50%  respiratory variability, suggesting right atrial pressure of 3 mmHg.   Comparison(s): A prior study was performed on 10/2019. No significant  change from prior study.   FINDINGS   Left Ventricle: Left ventricular ejection fraction, by estimation, is 55  to 60%. The left ventricle has normal function. The left ventricle has no  regional wall motion  abnormalities. Global longitudinal strain performed  but not reported based on  interpreter judgement due to suboptimal tracking. The left ventricular  internal cavity size was normal in size. There is severe concentric left  ventricular hypertrophy. Left ventricular diastolic parameters are  consistent with Grade III diastolic  dysfunction (restrictive).   Right Ventricle: The right ventricular size is  normal. No increase in  right ventricular wall thickness. Right ventricular systolic function is  normal. There is normal pulmonary artery systolic pressure. The tricuspid  regurgitant velocity is 2.10 m/s, and   with an assumed right atrial pressure of 3 mmHg, the estimated right  ventricular systolic pressure is 20.6 mmHg.   Left Atrium: Left atrial size was mildly dilated.   Right Atrium: Right atrial size was normal in size.   Pericardium: There is no evidence of pericardial effusion.   Mitral Valve: The mitral valve is normal in structure. Mild mitral annular  calcification. No evidence of mitral valve regurgitation. No evidence of  mitral valve stenosis.   Tricuspid Valve: There is a small (1.81 x 1.46 cm) mass on the posterior  leaflet of the tricuspid valve, best seen in the subcostal view (clip 64),  which appears to be present on previous echo done in 10/2019 (clip 68). May  need further cardiac imaging  study (cardiac MRI) for better visualization. The tricuspid valve is  normal in structure. Tricuspid valve regurgitation is trivial. No evidence  of tricuspid stenosis.   Aortic Valve: The aortic valve is calcified. There is moderate  calcification of the aortic valve. There is mild aortic valve annular  calcification. Aortic valve regurgitation is mild to moderate. Aortic  regurgitation PHT measures 609 msec. Moderate to  severe aortic stenosis is present. Aortic valve mean gradient measures  43.0 mmHg. Aortic valve peak gradient measures 70.6 mmHg. Aortic valve  area,  by VTI measures 1.25 cm.   Pulmonic Valve: The pulmonic valve was normal in structure. Pulmonic valve  regurgitation is not visualized. No evidence of pulmonic stenosis.   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 The aortic root is normal in size  and structure.   Venous: The inferior vena cava is normal in size with greater than 50%  respiratory variability, suggesting right atrial pressure of 3 mmHg.   IAS/Shunts: No atrial level shunt detected by color flow Doppler.   Recent Labs: 06/10/2020: ALT 12; B Natriuretic Peptide 53.0 07/06/2020: TSH 1.171 08/18/2020: BUN 21; Creatinine, Ser 0.98; Hemoglobin 10.9; Magnesium 1.4; Platelets 213; Potassium 4.5; Sodium 140  Recent Lipid Panel    Component Value Date/Time   CHOL 82 11/27/2019 1004   CHOL 172 12/10/2018 1058   TRIG 60 11/27/2019 1004   HDL 37 (L) 11/27/2019 1004   HDL 43 12/10/2018 1058   CHOLHDL 2.2 11/27/2019 1004   VLDL 12 11/27/2019 1004   LDLCALC 33 11/27/2019 1004   LDLCALC 100 (H) 12/10/2018 1058    Physical Exam:    VS:  BP (!) 142/78   Pulse 74   Ht 5\' 10"  (1.778 m)   Wt 200 lb 12.8 oz (91.1 kg)   SpO2 98%   BMI 28.81 kg/m     Wt Readings from Last 3 Encounters:  08/01/20 200 lb 12.8 oz (91.1 kg)  07/19/20 199 lb 9 oz (90.5 kg)  07/12/20 206 lb (93.4 kg)     GEN: Well nourished, well developed in no acute distress HEENT: Normal NECK: No JVD; No carotid bruits LYMPHATICS: No lymphadenopathy CARDIAC: S1S2 noted,RRR, no murmurs, rubs, gallops RESPIRATORY:  Clear to auscultation without rales, wheezing or rhonchi  ABDOMEN: Soft, non-tender, non-distended, +bowel sounds, no guarding. EXTREMITIES: No edema, No cyanosis, no clubbing MUSCULOSKELETAL:  No deformity  SKIN: Warm and dry NEUROLOGIC:  Alert and oriented x 3, non-focal PSYCHIATRIC:  Normal affect, good insight  ASSESSMENT:    1. Coronary artery disease involving native  coronary artery of native heart without angina pectoris   2. Diabetes  mellitus due to underlying condition with hyperosmolarity without coma, without long-term current use of insulin (HCC)   3. Hyperlipidemia LDL goal <70   4. Tobacco abuse   5. Encounter to establish care with new doctor   6. Nonrheumatic aortic valve stenosis   7. Nonrheumatic aortic valve insufficiency    PLAN:     Since starting his iron therapy he has been doing well shortness of breath has improved significantly.  He is very happy.  He has been looking for a new primary care provider we will refer the patient to Dr. Sedalia Muta PCP office here in [place].  He is still experiencing chest pain - after reassess his clinical picture and reviewing his recent echo - despite AVA 1.25 cmsq suspect this may be symptomatic aortic stenosis - Mean gradient 43 mmHg, peak velocity 4.2 m/s. Therefore in light if this and concern for The finding on the his tricuspid valve it will be best to get a TEE. Will cancel the cardiac MR. Also will refer the patient to CT surgery.   This is being managed by his primary care doctor.  No adjustments for antidiabetic medications were made today.  His blood pressure has elevated in the office we will elected to stop the lisinopril and started patient on valsartan 40 mg daily. Hyperlipidemia - continue with current statin medication.   The patient is in agreement with the above plan. The patient left the office in stable condition.  The patient will follow up in 6 months-he plans to follow up with me in [place].   Medication Adjustments/Labs and Tests Ordered: Current medicines are reviewed at length with the patient today.  Concerns regarding medicines are outlined above.  Orders Placed This Encounter  Procedures   Ambulatory referral to [facility]    Meds ordered this encounter  Medications   valsartan (DIOVAN) 40 MG tablet    Sig: Take 1 tablet (40 mg total) by mouth daily.    Dispense:  90 tablet    Refill:  3    Patient Instructions  Medication  Instructions:   Your physician has recommended you make the following change in your medication:  STOP: Lisinopril  START: Valsartan 40 mg once daily  *If you need a refill on your cardiac medications before your next appointment, please call your pharmacy*   Lab Work: None If you have labs (blood work) drawn today and your tests are completely normal, you will receive your results only by: MyChart Message (if you have MyChart) OR A paper copy in the mail If you have any lab test that is abnormal or we need to change your treatment, we will call you to review the results.   Testing/Procedures: None   Follow-Up: At [facility], you and your health needs are our priority.  As part of our continuing mission to provide you with exceptional heart care, we have created designated Provider Care Teams.  These Care Teams include your primary Cardiologist (physician) and Advanced Practice Providers (APPs -  Physician Assistants and Nurse Practitioners) who all work together to provide you with the care you need, when you need it.  We recommend signing up for the patient portal called "MyChart".  Sign up information is provided on this After Visit Summary.  MyChart is used to connect with patients for Virtual Visits (Telemedicine).  Patients are able to view lab/test results, encounter notes, upcoming appointments, etc.  Non-urgent  messages can be sent to your provider as well.   To learn more about what you can do with MyChart, go to [email or web address].    Your next appointment:   6 month(s)  The format for your next appointment:   In Person  Provider:   [address]  - Thomasene Ripple, DO   Other Instructions    Adopting a Healthy Lifestyle.  Know what a healthy weight is for you (roughly BMI <25) and aim to maintain this   Aim for 7+ servings of fruits and vegetables daily   65-80+ fluid ounces of water or unsweet tea for healthy kidneys   Limit to max 1 drink of alcohol  per day; avoid smoking/tobacco   Limit animal fats in diet for cholesterol and heart health - choose grass fed whenever available   Avoid highly processed foods, and foods high in saturated/trans fats   Aim for low stress - take time to unwind and care for your mental health   Aim for 150 min of moderate intensity exercise weekly for heart health, and weights twice weekly for bone health   Aim for 7-9 hours of sleep daily   When it comes to diets, agreement about the perfect plan isnt easy to find, even among the experts. Experts at the [facility] [employer] developed an idea known as the Healthy Eating Plate. Just imagine a plate divided into logical, healthy portions.   The emphasis is on diet quality:   Load up on vegetables and fruits - one-half of your plate: Aim for color and variety, and remember that potatoes dont count.   Go for whole grains - one-quarter of your plate: Whole wheat, barley, wheat berries, quinoa, oats, brown rice, and foods made with them. If you want pasta, go with whole wheat pasta.   Protein power - one-quarter of your plate: Fish, chicken, beans, and nuts are all healthy, versatile protein sources. Limit red meat.   The diet, however, does go beyond the plate, offering a few other suggestions.   Use healthy plant oils, such as olive, canola, soy, corn, sunflower and peanut. Check the labels, and avoid partially hydrogenated oil, which have unhealthy trans fats.   If youre thirsty, drink water. Coffee and tea are good in moderation, but skip sugary drinks and limit milk and dairy products to one or two daily servings.   The type of carbohydrate in the diet is more important than the amount. Some sources of carbohydrates, such as vegetables, fruits, whole grains, and beans-are healthier than others.   Finally, stay active  Signed, Thomasene Ripple, DO  08/24/2020 12:10 PM    [facility]

## 2020-08-01 NOTE — Patient Instructions (Signed)
Medication Instructions:   Your physician has recommended you make the following change in your medication:  STOP: Lisinopril  START: Valsartan 40 mg once daily  *If you need a refill on your cardiac medications before your next appointment, please call your pharmacy*   Lab Work: None If you have labs (blood work) drawn today and your tests are completely normal, you will receive your results only by: MyChart Message (if you have MyChart) OR A paper copy in the mail If you have any lab test that is abnormal or we need to change your treatment, we will call you to review the results.   Testing/Procedures: None   Follow-Up: At [facility], you and your health needs are our priority.  As part of our continuing mission to provide you with exceptional heart care, we have created designated Provider Care Teams.  These Care Teams include your primary Cardiologist (physician) and Advanced Practice Providers (APPs -  Physician Assistants and Nurse Practitioners) who all work together to provide you with the care you need, when you need it.  We recommend signing up for the patient portal called "MyChart".  Sign up information is provided on this After Visit Summary.  MyChart is used to connect with patients for Virtual Visits (Telemedicine).  Patients are able to view lab/test results, encounter notes, upcoming appointments, etc.  Non-urgent messages can be sent to your provider as well.   To learn more about what you can do with MyChart, go to [email or web address].    Your next appointment:   6 month(s)  The format for your next appointment:   In Person  Provider:   [address]  - Thomasene Ripple, DO   Other Instructions

## 2020-08-01 NOTE — H&P (View-Only) (Signed)
Cardiology Office Note:    Date:  08/24/2020   ID:  DEBORAH DONDERO, DOB 1960/12/04, MRN [id number]  PCP:  Jearld Lesch, MD  Cardiologist:  Thomasene Ripple, DO  Electrophysiologist:  None   Referring MD: Simone Curia, MD   No chief complaint on file. I am doing fine  History of Present Illness:    Vincent Guerrero is a 60 y.o. male with a hx of coronary artery disease status post stent in the RCA a result of an MI in 2010, hyperlipidemia (not on statins due to statin intolerance), diabetes mellitus (on Metformin and glipizide),, current smoker CVA in 2011, moderate aortic stenosis in April 2018.    In October the patient was short of breath were concerned that this may have been coronary artery disease therefore I set the patient up for a left heart catheterization but unfortunately his hemoglobin was significantly low.    He was seen by with GI he has had a colonoscopy which showed some ulcers.     When I saw the patient in March 2022 he had stopped his Repatha as well as his Imdur.  He felt both of these medication was playing with his mind.  I therefore placed the patient on Ranexa to help with any angina symptoms.  He had some wheezing that they and I referred the patient to pulmonary as he was not treated for likely undiagnosed COPD given his long smoking history.  His blood pressure was also elevated that day when asked the patient said he had not taken his blood pressure medication.   I saw the patient on June 02, 2020 he had complained of some shortness of breath and chest tightness at the time my office he has been wheezing.  He reported he had not been using his inhalers.  Placed a referral for pulmonary as well.  During that visit he had not been taking the Ranexa as recommended and he had previously stopped the Imdur.  His hemoglobin was also low during that visit which appeared to be dropping.  And he had stopped the Protonix.  The patient was here with his fiance that  day I explained to him that for his angina he needed to stay on the Ranexa and also hopefully if he could tolerate the Imdur.  I restarted on the Ranexa at that visit.  I also explained to the patient that giving his history of GI bleeding he would need to stay on the Protonix.     I saw the patient in May 2022 at that time we will set up for him to see GI as well as hematology.  He missed his GI appointment but was able to see hematology and has been started on iron treatment.  He says that the shortness of breath has improved.  Last week he ended up in the emergency department at [facility] where his troponins was negative.  He was noted to have musculoskeletal pain he was given some lidocaine and Tylenol which helped his pain.  Today he offers no complaints.  He is looking for a new primary care doctor.  Past Medical History:  Diagnosis Date   Age-related nuclear cataract of both eyes 11/27/2018   Anemia    Bilateral recurrent inguinal hernia without obstruction or gangrene 05/09/2016   Added automatically from request for surgery [id number]   Chest pressure 11/06/2019   Coronary artery disease involving native artery of transplanted heart without angina pectoris    Cortical age-related cataract of  both eyes 11/27/2018   Depression    Diabetes mellitus (HCC)    Hyperlipidemia LDL goal <70    Hypertension    Inguinal hernia without obstruction or gangrene    BILATERAL   Primary open angle glaucoma (POAG) of both eyes, severe stage 11/27/2018   Primary open angle glaucoma (POAG) of left eye, severe stage 11/27/2018   Tobacco abuse 11/05/2019   Tobacco use 11/06/2019    Past Surgical History:  Procedure Laterality Date   BIOPSY  11/27/2019   Procedure: BIOPSY;  Surgeon: Sherrilyn Rist, MD;  Location: [facility] ENDOSCOPY;  Service: Gastroenterology;;   COLONOSCOPY WITH PROPOFOL N/A 11/27/2019   Procedure: COLONOSCOPY WITH PROPOFOL;  Surgeon: Sherrilyn Rist, MD;  Location: [facility] ENDOSCOPY;  Service:  Gastroenterology;  Laterality: N/A;   ESOPHAGOGASTRODUODENOSCOPY (EGD) WITH PROPOFOL N/A 11/27/2019   Procedure: ESOPHAGOGASTRODUODENOSCOPY (EGD) WITH PROPOFOL;  Surgeon: Sherrilyn Rist, MD;  Location: [facility] ENDOSCOPY;  Service: Gastroenterology;  Laterality: N/A;   HEMOSTASIS CLIP PLACEMENT  11/27/2019   Procedure: HEMOSTASIS CLIP PLACEMENT;  Surgeon: Sherrilyn Rist, MD;  Location: [facility] ENDOSCOPY;  Service: Gastroenterology;;   HERNIA REPAIR  1998   LEFT HEART CATH AND CORONARY ANGIOGRAPHY N/A 06/04/2016   Procedure: Left Heart Cath and Coronary Angiography;  Surgeon: Lyn Records, MD;  Location: [facility] INVASIVE CV LAB;  Service: Cardiovascular;  Laterality: N/A;    Current Medications: Current Meds  Medication Sig   glipiZIDE (GLUCOTROL) 10 MG tablet Take 10 mg by mouth 2 (two) times daily before a meal.    IRON, FERROUS SULFATE, PO Take 65 mg by mouth daily. Takes SV iron  by prescription.   metFORMIN (GLUCOPHAGE) 1000 MG tablet Take 1,000 mg by mouth 2 (two) times daily with a meal.   metoprolol (LOPRESSOR) 50 MG tablet Take 1 tablet (50 mg total) by mouth 2 (two) times daily.   nitroGLYCERIN (NITROSTAT) 0.4 MG SL tablet Place 1 tablet (0.4 mg total) under the tongue every 5 (five) minutes as needed for chest pain.   pantoprazole (PROTONIX) 40 MG tablet Take 1 tablet (40 mg total) by mouth daily.   ranolazine (RANEXA) 500 MG 12 hr tablet Take 500 mg by mouth 2 (two) times daily.   valsartan (DIOVAN) 40 MG tablet Take 1 tablet (40 mg total) by mouth daily.   [DISCONTINUED] lisinopril (PRINIVIL,ZESTRIL) 20 MG tablet Take 1 tablet (20 mg total) by mouth 2 (two) times daily.     Allergies:   Patient has no known allergies.   Social History   Socioeconomic History   Marital status: Legally Separated    Spouse name: Not on file   Number of children: Not on file   Years of education: Not on file   Highest education level: Not on file  Occupational History   Not on file  Tobacco  Use   Smoking status: Every Day    Packs/day: 1.00    Years: 40.00    Pack years: 40.00    Types: Cigarettes   Smokeless tobacco: Current    Types: Chew   Tobacco comments:    half pack daily-05/05/2020-AH  Vaping Use   Vaping Use: Never used  Substance and Sexual Activity   Alcohol use: No   Drug use: Yes    Types: Marijuana   Sexual activity: Yes  Other Topics Concern   Not on file  Social History Narrative   Not on file   Social Determinants of Health   Financial Resource Strain:  Not on file  Food Insecurity: Not on file  Transportation Needs: Not on file  Physical Activity: Not on file  Stress: Not on file  Social Connections: Not on file     Family History: The patient's family history includes CAD in his father; Diabetes in his father; Heart failure in his mother; Hypertension in his mother; Prostate cancer in his brother.  ROS:   Review of Systems  Constitution: Negative for decreased appetite, fever and weight gain.  HENT: Negative for congestion, ear discharge, hoarse voice and sore throat.   Eyes: Negative for discharge, redness, vision loss in right eye and visual halos.  Cardiovascular: Negative for chest pain, dyspnea on exertion, leg swelling, orthopnea and palpitations.  Respiratory: Negative for cough, hemoptysis, shortness of breath and snoring.   Endocrine: Negative for heat intolerance and polyphagia.  Hematologic/Lymphatic: Negative for bleeding problem. Does not bruise/bleed easily.  Skin: Negative for flushing, nail changes, rash and suspicious lesions.  Musculoskeletal: Negative for arthritis, joint pain, muscle cramps, myalgias, neck pain and stiffness.  Gastrointestinal: Negative for abdominal pain, bowel incontinence, diarrhea and excessive appetite.  Genitourinary: Negative for decreased libido, genital sores and incomplete emptying.  Neurological: Negative for brief paralysis, focal weakness, headaches and loss of balance.   Psychiatric/Behavioral: Negative for altered mental status, depression and suicidal ideas.  Allergic/Immunologic: Negative for HIV exposure and persistent infections.    EKGs/Labs/Other Studies Reviewed:    The following studies were reviewed today:   EKG: None today  Transthoracic echocardiogram done Jun 07, 2020 IMPRESSIONS   1. Left ventricular ejection fraction, by estimation, is 55 to 60%. The  left ventricle has normal function. The left ventricle has no regional  wall motion abnormalities. There is severe concentric left ventricular  hypertrophy. Left ventricular diastolic   parameters are consistent with Grade III diastolic dysfunction  (restrictive).   2. Right ventricular systolic function is normal. The right ventricular  size is normal. There is normal pulmonary artery systolic pressure.   3. Left atrial size was mildly dilated.   4. The mitral valve is normal in structure. No evidence of mitral valve  regurgitation. No evidence of mitral stenosis.   5. There is a small (1.81 x 1.46 cm) mass on the posterior leaflet of the  tricuspid valve, best seen in the subcostal view (clip 64), which appears  to be present on previous echo done in 10/2019 (clip 68). May need further  cardiac imaging study (cardiac  MRI) for better visualization.   6. The aortic valve is calcified. There is moderate calcification of the  aortic valve. Aortic valve regurgitation is mild to moderate. Moderate to  severe aortic valve stenosis.   7. 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   8. The inferior vena cava is normal in size with greater than 50%  respiratory variability, suggesting right atrial pressure of 3 mmHg.   Comparison(s): A prior study was performed on 10/2019. No significant  change from prior study.   FINDINGS   Left Ventricle: Left ventricular ejection fraction, by estimation, is 55  to 60%. The left ventricle has normal function. The left ventricle has no  regional wall motion  abnormalities. Global longitudinal strain performed  but not reported based on  interpreter judgement due to suboptimal tracking. The left ventricular  internal cavity size was normal in size. There is severe concentric left  ventricular hypertrophy. Left ventricular diastolic parameters are  consistent with Grade III diastolic  dysfunction (restrictive).   Right Ventricle: The right ventricular size is  normal. No increase in  right ventricular wall thickness. Right ventricular systolic function is  normal. There is normal pulmonary artery systolic pressure. The tricuspid  regurgitant velocity is 2.10 m/s, and   with an assumed right atrial pressure of 3 mmHg, the estimated right  ventricular systolic pressure is 20.6 mmHg.   Left Atrium: Left atrial size was mildly dilated.   Right Atrium: Right atrial size was normal in size.   Pericardium: There is no evidence of pericardial effusion.   Mitral Valve: The mitral valve is normal in structure. Mild mitral annular  calcification. No evidence of mitral valve regurgitation. No evidence of  mitral valve stenosis.   Tricuspid Valve: There is a small (1.81 x 1.46 cm) mass on the posterior  leaflet of the tricuspid valve, best seen in the subcostal view (clip 64),  which appears to be present on previous echo done in 10/2019 (clip 68). May  need further cardiac imaging  study (cardiac MRI) for better visualization. The tricuspid valve is  normal in structure. Tricuspid valve regurgitation is trivial. No evidence  of tricuspid stenosis.   Aortic Valve: The aortic valve is calcified. There is moderate  calcification of the aortic valve. There is mild aortic valve annular  calcification. Aortic valve regurgitation is mild to moderate. Aortic  regurgitation PHT measures 609 msec. Moderate to  severe aortic stenosis is present. Aortic valve mean gradient measures  43.0 mmHg. Aortic valve peak gradient measures 70.6 mmHg. Aortic valve  area,  by VTI measures 1.25 cm.   Pulmonic Valve: The pulmonic valve was normal in structure. Pulmonic valve  regurgitation is not visualized. No evidence of pulmonic stenosis.   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 The aortic root is normal in size  and structure.   Venous: The inferior vena cava is normal in size with greater than 50%  respiratory variability, suggesting right atrial pressure of 3 mmHg.   IAS/Shunts: No atrial level shunt detected by color flow Doppler.   Recent Labs: 06/10/2020: ALT 12; B Natriuretic Peptide 53.0 07/06/2020: TSH 1.171 08/18/2020: BUN 21; Creatinine, Ser 0.98; Hemoglobin 10.9; Magnesium 1.4; Platelets 213; Potassium 4.5; Sodium 140  Recent Lipid Panel    Component Value Date/Time   CHOL 82 11/27/2019 1004   CHOL 172 12/10/2018 1058   TRIG 60 11/27/2019 1004   HDL 37 (L) 11/27/2019 1004   HDL 43 12/10/2018 1058   CHOLHDL 2.2 11/27/2019 1004   VLDL 12 11/27/2019 1004   LDLCALC 33 11/27/2019 1004   LDLCALC 100 (H) 12/10/2018 1058    Physical Exam:    VS:  BP (!) 142/78   Pulse 74   Ht 5\' 10"  (1.778 m)   Wt 200 lb 12.8 oz (91.1 kg)   SpO2 98%   BMI 28.81 kg/m     Wt Readings from Last 3 Encounters:  08/01/20 200 lb 12.8 oz (91.1 kg)  07/19/20 199 lb 9 oz (90.5 kg)  07/12/20 206 lb (93.4 kg)     GEN: Well nourished, well developed in no acute distress HEENT: Normal NECK: No JVD; No carotid bruits LYMPHATICS: No lymphadenopathy CARDIAC: S1S2 noted,RRR, no murmurs, rubs, gallops RESPIRATORY:  Clear to auscultation without rales, wheezing or rhonchi  ABDOMEN: Soft, non-tender, non-distended, +bowel sounds, no guarding. EXTREMITIES: No edema, No cyanosis, no clubbing MUSCULOSKELETAL:  No deformity  SKIN: Warm and dry NEUROLOGIC:  Alert and oriented x 3, non-focal PSYCHIATRIC:  Normal affect, good insight  ASSESSMENT:    1. Coronary artery disease involving native  coronary artery of native heart without angina pectoris   2. Diabetes  mellitus due to underlying condition with hyperosmolarity without coma, without long-term current use of insulin (HCC)   3. Hyperlipidemia LDL goal <70   4. Tobacco abuse   5. Encounter to establish care with new doctor   6. Nonrheumatic aortic valve stenosis   7. Nonrheumatic aortic valve insufficiency    PLAN:     Since starting his iron therapy he has been doing well shortness of breath has improved significantly.  He is very happy.  He has been looking for a new primary care provider we will refer the patient to Dr. Sedalia Muta PCP office here in [place].  He is still experiencing chest pain - after reassess his clinical picture and reviewing his recent echo - despite AVA 1.25 cmsq suspect this may be symptomatic aortic stenosis - Mean gradient 43 mmHg, peak velocity 4.2 m/s. Therefore in light if this and concern for The finding on the his tricuspid valve it will be best to get a TEE. Will cancel the cardiac MR. Also will refer the patient to CT surgery.   This is being managed by his primary care doctor.  No adjustments for antidiabetic medications were made today.  His blood pressure has elevated in the office we will elected to stop the lisinopril and started patient on valsartan 40 mg daily. Hyperlipidemia - continue with current statin medication.   The patient is in agreement with the above plan. The patient left the office in stable condition.  The patient will follow up in 6 months-he plans to follow up with me in [place].   Medication Adjustments/Labs and Tests Ordered: Current medicines are reviewed at length with the patient today.  Concerns regarding medicines are outlined above.  Orders Placed This Encounter  Procedures   Ambulatory referral to [facility]    Meds ordered this encounter  Medications   valsartan (DIOVAN) 40 MG tablet    Sig: Take 1 tablet (40 mg total) by mouth daily.    Dispense:  90 tablet    Refill:  3    Patient Instructions  Medication  Instructions:   Your physician has recommended you make the following change in your medication:  STOP: Lisinopril  START: Valsartan 40 mg once daily  *If you need a refill on your cardiac medications before your next appointment, please call your pharmacy*   Lab Work: None If you have labs (blood work) drawn today and your tests are completely normal, you will receive your results only by: MyChart Message (if you have MyChart) OR A paper copy in the mail If you have any lab test that is abnormal or we need to change your treatment, we will call you to review the results.   Testing/Procedures: None   Follow-Up: At [facility], you and your health needs are our priority.  As part of our continuing mission to provide you with exceptional heart care, we have created designated Provider Care Teams.  These Care Teams include your primary Cardiologist (physician) and Advanced Practice Providers (APPs -  Physician Assistants and Nurse Practitioners) who all work together to provide you with the care you need, when you need it.  We recommend signing up for the patient portal called "MyChart".  Sign up information is provided on this After Visit Summary.  MyChart is used to connect with patients for Virtual Visits (Telemedicine).  Patients are able to view lab/test results, encounter notes, upcoming appointments, etc.  Non-urgent  messages can be sent to your provider as well.   To learn more about what you can do with MyChart, go to [email or web address].    Your next appointment:   6 month(s)  The format for your next appointment:   In Person  Provider:   [address]  - Thomasene Ripple, DO   Other Instructions    Adopting a Healthy Lifestyle.  Know what a healthy weight is for you (roughly BMI <25) and aim to maintain this   Aim for 7+ servings of fruits and vegetables daily   65-80+ fluid ounces of water or unsweet tea for healthy kidneys   Limit to max 1 drink of alcohol  per day; avoid smoking/tobacco   Limit animal fats in diet for cholesterol and heart health - choose grass fed whenever available   Avoid highly processed foods, and foods high in saturated/trans fats   Aim for low stress - take time to unwind and care for your mental health   Aim for 150 min of moderate intensity exercise weekly for heart health, and weights twice weekly for bone health   Aim for 7-9 hours of sleep daily   When it comes to diets, agreement about the perfect plan isnt easy to find, even among the experts. Experts at the [facility] [employer] developed an idea known as the Healthy Eating Plate. Just imagine a plate divided into logical, healthy portions.   The emphasis is on diet quality:   Load up on vegetables and fruits - one-half of your plate: Aim for color and variety, and remember that potatoes dont count.   Go for whole grains - one-quarter of your plate: Whole wheat, barley, wheat berries, quinoa, oats, brown rice, and foods made with them. If you want pasta, go with whole wheat pasta.   Protein power - one-quarter of your plate: Fish, chicken, beans, and nuts are all healthy, versatile protein sources. Limit red meat.   The diet, however, does go beyond the plate, offering a few other suggestions.   Use healthy plant oils, such as olive, canola, soy, corn, sunflower and peanut. Check the labels, and avoid partially hydrogenated oil, which have unhealthy trans fats.   If youre thirsty, drink water. Coffee and tea are good in moderation, but skip sugary drinks and limit milk and dairy products to one or two daily servings.   The type of carbohydrate in the diet is more important than the amount. Some sources of carbohydrates, such as vegetables, fruits, whole grains, and beans-are healthier than others.   Finally, stay active  Signed, Thomasene Ripple, DO  08/24/2020 12:10 PM    [facility]

## 2020-08-05 ENCOUNTER — Ambulatory Visit: Payer: Medicaid Other | Admitting: Cardiology

## 2020-08-15 ENCOUNTER — Telehealth (HOSPITAL_COMMUNITY): Payer: Self-pay | Admitting: Emergency Medicine

## 2020-08-15 NOTE — Telephone Encounter (Signed)
Reaching out to patient to offer assistance regarding upcoming cardiac imaging study; pt verbalizes understanding of appt date/time, parking situation and where to check in, and verified current allergies; name and call back number provided for further questions should they arise Rockwell Alexandria RN Navigator Cardiac Imaging [facility] [phone] office [phone] cell  Denies implants other than stent in heart, denies claustro Denies iv issues

## 2020-08-16 ENCOUNTER — Telehealth: Payer: Self-pay

## 2020-08-16 NOTE — Telephone Encounter (Signed)
Tired to call patient again no answer, left another message telling him not to go to his MRI appointment tomorrow. Attempted to call his brother's number, received a message saying the number was wrong. Message staff who scheduled the appointment to see if they could reach the patient of cancel the appointment seeing that I can not at this time.

## 2020-08-16 NOTE — Telephone Encounter (Signed)
Left message for patient to return our call.

## 2020-08-17 ENCOUNTER — Telehealth: Payer: Self-pay

## 2020-08-17 ENCOUNTER — Ambulatory Visit (HOSPITAL_COMMUNITY): Admission: RE | Admit: 2020-08-17 | Payer: Medicaid Other | Source: Ambulatory Visit

## 2020-08-17 DIAGNOSIS — I350 Nonrheumatic aortic (valve) stenosis: Secondary | ICD-10-CM

## 2020-08-17 NOTE — Telephone Encounter (Signed)
Called patient to give him the address of the TEE. Will print instructions for patient to pick up tomorrow when he gets his labs drawn.

## 2020-08-17 NOTE — Telephone Encounter (Signed)
Called patient to make sure he received the messages from yesterday. He did and is not going to his MRI today. Patient made aware of Dr. Mallory Shirk plans and is agreeable. Will schedule patient for TEE.

## 2020-08-17 NOTE — Telephone Encounter (Signed)
Called patient to let him now he is scheduled for a TEE on Jul 26th with Dr. Elease Hashimoto, he needs to arrive at 6:30. Also we are referring him to a Cardiothoracic surgeon. He will need to have blood work completed before the TEE cn be completed. Will print instructions out for patient and leave at the front desk. He verbalized understanding. No questions expressed at this time.

## 2020-08-19 ENCOUNTER — Other Ambulatory Visit: Payer: Self-pay

## 2020-08-19 ENCOUNTER — Telehealth: Payer: Self-pay | Admitting: Hematology and Oncology

## 2020-08-19 LAB — CBC WITH DIFFERENTIAL/PLATELET
Basophils Absolute: 0 10*3/uL (ref 0.0–0.2)
Basos: 0 %
EOS (ABSOLUTE): 0.1 10*3/uL (ref 0.0–0.4)
Eos: 1 %
Hematocrit: 34.6 % — ABNORMAL LOW (ref 37.5–51.0)
Hemoglobin: 10.9 g/dL — ABNORMAL LOW (ref 13.0–17.7)
Immature Grans (Abs): 0 10*3/uL (ref 0.0–0.1)
Immature Granulocytes: 0 %
Lymphocytes Absolute: 2.9 10*3/uL (ref 0.7–3.1)
Lymphs: 24 %
MCH: 23.9 pg — ABNORMAL LOW (ref 26.6–33.0)
MCHC: 31.5 g/dL (ref 31.5–35.7)
MCV: 76 fL — ABNORMAL LOW (ref 79–97)
Monocytes Absolute: 0.9 10*3/uL (ref 0.1–0.9)
Monocytes: 8 %
Neutrophils Absolute: 8.2 10*3/uL — ABNORMAL HIGH (ref 1.4–7.0)
Neutrophils: 67 %
Platelets: 213 10*3/uL (ref 150–450)
RBC: 4.57 x10E6/uL (ref 4.14–5.80)
RDW: 28.9 % — ABNORMAL HIGH (ref 11.6–15.4)
WBC: 12.2 10*3/uL — ABNORMAL HIGH (ref 3.4–10.8)

## 2020-08-19 LAB — BASIC METABOLIC PANEL
BUN/Creatinine Ratio: 21 — ABNORMAL HIGH (ref 9–20)
BUN: 21 mg/dL (ref 6–24)
CO2: 21 mmol/L (ref 20–29)
Calcium: 9.3 mg/dL (ref 8.7–10.2)
Chloride: 105 mmol/L (ref 96–106)
Creatinine, Ser: 0.98 mg/dL (ref 0.76–1.27)
Glucose: 119 mg/dL — ABNORMAL HIGH (ref 65–99)
Potassium: 4.5 mmol/L (ref 3.5–5.2)
Sodium: 140 mmol/L (ref 134–144)
eGFR: 89 mL/min/{1.73_m2} (ref >59)

## 2020-08-19 LAB — MAGNESIUM: Magnesium: 1.4 mg/dL — ABNORMAL LOW (ref 1.6–2.3)

## 2020-08-19 MED ORDER — MAGNESIUM OXIDE -MG SUPPLEMENT 400 (240 MG) MG PO TABS: 400.0000 mg | ORAL_TABLET | Freq: Two times a day (BID) | ORAL | 0 refills | Status: DC

## 2020-08-19 NOTE — Telephone Encounter (Signed)
Patient was seen by Dr Bertis Ruddy in June - Rescheduled July's Labs, Follow Up Appt to 7/28 Labs 1:00 pm - Follow Up w/Kelli at 1:30 pm

## 2020-08-19 NOTE — Progress Notes (Signed)
ma

## 2020-08-19 NOTE — Progress Notes (Signed)
Prescription sent to pharmacy.

## 2020-08-24 DIAGNOSIS — I350 Nonrheumatic aortic (valve) stenosis: Secondary | ICD-10-CM | POA: Insufficient documentation

## 2020-08-24 DIAGNOSIS — Z7689 Persons encountering health services in other specified circumstances: Secondary | ICD-10-CM | POA: Insufficient documentation

## 2020-08-24 DIAGNOSIS — I351 Nonrheumatic aortic (valve) insufficiency: Secondary | ICD-10-CM | POA: Insufficient documentation

## 2020-08-29 ENCOUNTER — Other Ambulatory Visit: Payer: Medicaid Other

## 2020-08-29 ENCOUNTER — Inpatient Hospital Stay: Payer: Medicaid Other | Attending: Hematology and Oncology

## 2020-08-29 DIAGNOSIS — Z7984 Long term (current) use of oral hypoglycemic drugs: Secondary | ICD-10-CM | POA: Insufficient documentation

## 2020-08-29 DIAGNOSIS — E119 Type 2 diabetes mellitus without complications: Secondary | ICD-10-CM | POA: Insufficient documentation

## 2020-08-29 DIAGNOSIS — E559 Vitamin D deficiency, unspecified: Secondary | ICD-10-CM | POA: Diagnosis not present

## 2020-08-29 DIAGNOSIS — F17210 Nicotine dependence, cigarettes, uncomplicated: Secondary | ICD-10-CM | POA: Insufficient documentation

## 2020-08-29 DIAGNOSIS — Z79899 Other long term (current) drug therapy: Secondary | ICD-10-CM | POA: Diagnosis not present

## 2020-08-29 DIAGNOSIS — D500 Iron deficiency anemia secondary to blood loss (chronic): Secondary | ICD-10-CM

## 2020-08-29 DIAGNOSIS — I10 Essential (primary) hypertension: Secondary | ICD-10-CM | POA: Insufficient documentation

## 2020-08-29 DIAGNOSIS — D508 Other iron deficiency anemias: Secondary | ICD-10-CM | POA: Insufficient documentation

## 2020-08-29 DIAGNOSIS — D539 Nutritional anemia, unspecified: Secondary | ICD-10-CM

## 2020-08-29 DIAGNOSIS — K909 Intestinal malabsorption, unspecified: Secondary | ICD-10-CM | POA: Diagnosis present

## 2020-08-29 LAB — CBC WITH DIFFERENTIAL/PLATELET
Abs Immature Granulocytes: 0.03 10*3/uL (ref 0.00–0.07)
Basophils Absolute: 0 10*3/uL (ref 0.0–0.1)
Basophils Relative: 0 %
Eosinophils Absolute: 0.1 10*3/uL (ref 0.0–0.5)
Eosinophils Relative: 1 %
HCT: 37.1 % — ABNORMAL LOW (ref 39.0–52.0)
Hemoglobin: 11.2 g/dL — ABNORMAL LOW (ref 13.0–17.0)
Immature Granulocytes: 0 %
Lymphocytes Relative: 27 %
Lymphs Abs: 2.8 10*3/uL (ref 0.7–4.0)
MCH: 24.8 pg — ABNORMAL LOW (ref 26.0–34.0)
MCHC: 30.2 g/dL (ref 30.0–36.0)
MCV: 82.1 fL (ref 80.0–100.0)
Monocytes Absolute: 0.9 10*3/uL (ref 0.1–1.0)
Monocytes Relative: 9 %
Neutro Abs: 6.3 10*3/uL (ref 1.7–7.7)
Neutrophils Relative %: 63 %
Platelets: 229 10*3/uL (ref 150–400)
RBC: 4.52 MIL/uL (ref 4.22–5.81)
RDW: 30.7 % — ABNORMAL HIGH (ref 11.5–15.5)
WBC: 10.1 10*3/uL (ref 4.0–10.5)
nRBC: 0 % (ref 0.0–0.2)

## 2020-08-29 LAB — FERRITIN: Ferritin: 46 ng/mL (ref 24–336)

## 2020-08-29 LAB — IRON AND TIBC
Iron: 45 ug/dL (ref 45–182)
Saturation Ratios: 14 % — ABNORMAL LOW (ref 17.9–39.5)
TIBC: 323 ug/dL (ref 250–450)
UIBC: 278 ug/dL

## 2020-08-29 LAB — SEDIMENTATION RATE: Sed Rate: 11 mm/hr (ref 0–16)

## 2020-08-29 LAB — VITAMIN B12: Vitamin B-12: 272 pg/mL (ref 180–914)

## 2020-08-29 NOTE — Anesthesia Preprocedure Evaluation (Addendum)
Anesthesia Evaluation  Patient identified by MRN, date of birth, ID band Patient awake    Reviewed: Allergy & Precautions, NPO status , Patient's Chart, lab work & pertinent test results  Airway Mallampati: II  TM Distance: >3 FB Neck ROM: Full    Dental  (+) Teeth Intact   Pulmonary neg pulmonary ROS, Current Smoker and Patient abstained from smoking.,    Pulmonary exam normal        Cardiovascular hypertension, Pt. on medications and Pt. on home beta blockers + CAD   Rhythm:Regular Rate:Normal + Systolic murmurs AS, RV mass   Neuro/Psych Depression negative neurological ROS     GI/Hepatic Neg liver ROS, GERD  Medicated,  Endo/Other  diabetes, Type 2, Oral Hypoglycemic Agents  Renal/GU negative Renal ROS  negative genitourinary   Musculoskeletal negative musculoskeletal ROS (+)   Abdominal (+)  Abdomen: soft. Bowel sounds: normal.  Peds  Hematology  (+) anemia ,   Anesthesia Other Findings   Reproductive/Obstetrics                            Anesthesia Physical Anesthesia Plan  ASA: 3  Anesthesia Plan: MAC   Post-op Pain Management:    Induction: Intravenous  PONV Risk Score and Plan: Propofol infusion  Airway Management Planned: Simple Face Mask, Natural Airway and Nasal Cannula  Additional Equipment: None  Intra-op Plan:   Post-operative Plan:   Informed Consent: I have reviewed the patients History and Physical, chart, labs and discussed the procedure including the risks, benefits and alternatives for the proposed anesthesia with the patient or authorized representative who has indicated his/her understanding and acceptance.     Dental advisory given  Plan Discussed with: CRNA  Anesthesia Plan Comments: (ECHO 05/22: IMPRESSIONS  1. Left ventricular ejection fraction, by estimation, is 55 to 60%. The  left ventricle has normal function. The left ventricle has no  regional  wall motion abnormalities. There is severe concentric left ventricular  hypertrophy. Left ventricular diastolic  parameters are consistent with Grade III diastolic dysfunction  (restrictive).  2. Right ventricular systolic function is normal. The right ventricular  size is normal. There is normal pulmonary artery systolic pressure.  3. Left atrial size was mildly dilated.  4. The mitral valve is normal in structure. No evidence of mitral valve  regurgitation. No evidence of mitral stenosis.  5. There is a small (1.81 x 1.46 cm) mass on the posterior leaflet of the  tricuspid valve, best seen in the subcostal view (clip 64), which appears  to be present on previous echo done in 10/2019 (clip 68). May need further  cardiac imaging study (cardiac  MRI) for better visualization.  6. The aortic valve is calcified. There is moderate calcification of the  aortic valve. Aortic valve regurgitation is mild to moderate. Moderate to  severe aortic valve stenosis.  7. 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  8. The inferior vena cava is normal in size with greater than 50%  respiratory variability, suggesting right atrial pressure of 3 mmHg. )       Anesthesia Quick Evaluation

## 2020-08-30 ENCOUNTER — Other Ambulatory Visit: Payer: Self-pay

## 2020-08-30 ENCOUNTER — Ambulatory Visit (HOSPITAL_COMMUNITY): Payer: Medicaid Other | Admitting: Anesthesiology

## 2020-08-30 ENCOUNTER — Encounter (HOSPITAL_COMMUNITY)
Admission: RE | Disposition: A | Discharge status: Home / Self Care | Payer: Self-pay | Source: Home / Self Care | Attending: Cardiovascular Disease

## 2020-08-30 ENCOUNTER — Other Ambulatory Visit: Payer: Self-pay | Admitting: Cardiovascular Disease

## 2020-08-30 ENCOUNTER — Ambulatory Visit (HOSPITAL_BASED_OUTPATIENT_CLINIC_OR_DEPARTMENT_OTHER): Payer: Medicaid Other

## 2020-08-30 ENCOUNTER — Ambulatory Visit (HOSPITAL_COMMUNITY)
Admission: RE | Admit: 2020-08-30 | Discharge: 2020-08-30 | Disposition: A | Discharge status: Home / Self Care | Payer: Medicaid Other | Attending: Cardiovascular Disease | Admitting: Cardiovascular Disease

## 2020-08-30 DIAGNOSIS — I350 Nonrheumatic aortic (valve) stenosis: Secondary | ICD-10-CM

## 2020-08-30 DIAGNOSIS — Z7984 Long term (current) use of oral hypoglycemic drugs: Secondary | ICD-10-CM | POA: Insufficient documentation

## 2020-08-30 DIAGNOSIS — Z79899 Other long term (current) drug therapy: Secondary | ICD-10-CM | POA: Diagnosis not present

## 2020-08-30 DIAGNOSIS — I352 Nonrheumatic aortic (valve) stenosis with insufficiency: Secondary | ICD-10-CM | POA: Insufficient documentation

## 2020-08-30 DIAGNOSIS — I078 Other rheumatic tricuspid valve diseases: Secondary | ICD-10-CM

## 2020-08-30 DIAGNOSIS — Z955 Presence of coronary angioplasty implant and graft: Secondary | ICD-10-CM | POA: Insufficient documentation

## 2020-08-30 DIAGNOSIS — I340 Nonrheumatic mitral (valve) insufficiency: Secondary | ICD-10-CM

## 2020-08-30 DIAGNOSIS — F17210 Nicotine dependence, cigarettes, uncomplicated: Secondary | ICD-10-CM | POA: Insufficient documentation

## 2020-08-30 DIAGNOSIS — I119 Hypertensive heart disease without heart failure: Secondary | ICD-10-CM | POA: Insufficient documentation

## 2020-08-30 DIAGNOSIS — I2510 Atherosclerotic heart disease of native coronary artery without angina pectoris: Secondary | ICD-10-CM | POA: Diagnosis not present

## 2020-08-30 DIAGNOSIS — E119 Type 2 diabetes mellitus without complications: Secondary | ICD-10-CM | POA: Insufficient documentation

## 2020-08-30 DIAGNOSIS — E785 Hyperlipidemia, unspecified: Secondary | ICD-10-CM | POA: Insufficient documentation

## 2020-08-30 HISTORY — PX: TEE WITHOUT CARDIOVERSION: SHX5443

## 2020-08-30 LAB — GLUCOSE, CAPILLARY: Glucose-Capillary: 101 mg/dL — ABNORMAL HIGH (ref 70–99)

## 2020-08-30 SURGERY — ECHOCARDIOGRAM, TRANSESOPHAGEAL
Anesthesia: Monitor Anesthesia Care

## 2020-08-30 MED ORDER — GLYCOPYRROLATE PF 0.2 MG/ML IJ SOSY
PREFILLED_SYRINGE | INTRAMUSCULAR | Status: DC | PRN
  Administered 2020-08-30: .2 mg via INTRAVENOUS

## 2020-08-30 MED ORDER — SUCCINYLCHOLINE CHLORIDE 200 MG/10ML IV SOSY
PREFILLED_SYRINGE | INTRAVENOUS | Status: DC | PRN
  Administered 2020-08-30: 20 mg via INTRAVENOUS

## 2020-08-30 MED ORDER — DEXMEDETOMIDINE (PRECEDEX) IN NS 20 MCG/5ML (4 MCG/ML) IV SYRINGE
PREFILLED_SYRINGE | INTRAVENOUS | Status: DC | PRN
  Administered 2020-08-30: 12 ug via INTRAVENOUS

## 2020-08-30 MED ORDER — PROPOFOL 10 MG/ML IV BOLUS
INTRAVENOUS | Status: DC | PRN
  Administered 2020-08-30: 20 mg via INTRAVENOUS
  Administered 2020-08-30 (×2): 30 mg via INTRAVENOUS

## 2020-08-30 MED ORDER — LIDOCAINE 2% (20 MG/ML) 5 ML SYRINGE
INTRAMUSCULAR | Status: DC | PRN
  Administered 2020-08-30: 100 mg via INTRAVENOUS

## 2020-08-30 MED ORDER — PROPOFOL 500 MG/50ML IV EMUL
INTRAVENOUS | Status: DC | PRN
  Administered 2020-08-30: 100 ug/kg/min via INTRAVENOUS

## 2020-08-30 MED ORDER — SODIUM CHLORIDE 0.9 % IV SOLN: INTRAVENOUS | Status: DC

## 2020-08-30 MED ORDER — LACTATED RINGERS IV SOLN
INTRAVENOUS | Status: AC | PRN
  Administered 2020-08-30: 20 mL/h via INTRAVENOUS

## 2020-08-30 MED ORDER — BUTAMBEN-TETRACAINE-BENZOCAINE 2-2-14 % EX AERO
INHALATION_SPRAY | CUTANEOUS | Status: DC | PRN
  Administered 2020-08-30: 2 via TOPICAL

## 2020-08-30 NOTE — Transfer of Care (Signed)
Immediate Anesthesia Transfer of Care Note  Patient: Vincent Guerrero  Procedure(s) Performed: TRANSESOPHAGEAL ECHOCARDIOGRAM (TEE)  Patient Location: Endoscopy Unit  Anesthesia Type:MAC  Level of Consciousness: drowsy  Airway & Oxygen Therapy: Patient Spontanous Breathing and Patient connected to nasal cannula oxygen  Post-op Assessment: Report given to RN and Post -op Vital signs reviewed and stable  Post vital signs: Reviewed and stable  Last Vitals:  Vitals Value Taken Time  BP 115/59 08/30/20 0842  Temp 36.5 C 08/30/20 0841  Pulse 73 08/30/20 0844  Resp 18 08/30/20 0844  SpO2 97 % 08/30/20 0844  Vitals shown include unvalidated device data.  Last Pain:  Vitals:   08/30/20 0841  TempSrc: Temporal  PainSc: 0-No pain         Complications: No notable events documented.

## 2020-08-30 NOTE — Progress Notes (Signed)
  Echocardiogram Echocardiogram Transesophageal has been performed.  Delcie Roch 08/30/2020, 8:45 AM

## 2020-08-30 NOTE — CV Procedure (Addendum)
    Transesophageal Echocardiogram Note  COBI DELPH [id number] 1960-05-08  Procedure: Transesophageal Echocardiogram Indications: aortic stenosis   Procedure Details Consent: Obtained Time Out: Verified patient identification, verified procedure, site/side was marked, verified correct patient position, special equipment/implants available, Radiology Safety Procedures followed,  medications/allergies/relevent history reviewed, required imaging and test results available.  Performed  Medications:  During this procedure the patient is administered propofol drip by CRNA , Elease Hashimoto for   sedation.  Total meds.  Propofol 400 mg, Lidocaine 100 mg, Precidex 16 mcg, Succinal choline 20 mg .   The patient's heart rate, blood pressure, and oxygen saturation are monitored continuously during the procedure. The period of conscious sedation is 60  minutes, of which I was present face-to-face 100% of this time.  The patient had severe laryngospasm following the cetacaine spray .  Have added it as an allergy / intolerance   Left Ventrical:  normal LV function   Mitral Valve: trace MR , no vegetations   Aortic Valve: severely calcified.  Severe AS ,  AS gradient was not remeasured,  mild - mod AI   Tricuspid Valve: no TV mass observed.  Trivial TR   Pulmonic Valve: not well visualized.   No PI   Left Atrium/ Left atrial appendage: no thrombi   Atrial septum: no ASD or PFO by color doppler   Aorta: mild calcified plaque    Complications: Complications of - the patient had severe bronchospasm following cetacaine spray.  The required stopping the procedure.   Anesthesia administered additional meds and provided support until the bronchospasm resolved.  Patient did tolerate procedure well.   Vesta Mixer, Montez Hageman., MD, [facility] 08/30/2020, 8:32 AM

## 2020-08-30 NOTE — Interval H&P Note (Signed)
History and Physical Interval Note:  08/30/2020 7:42 AM  Vincent Guerrero  has presented today for surgery, with the diagnosis of AORTIC STENOSIS SUSPECTED MASS IN THE RV.  The various methods of treatment have been discussed with the patient and family. After consideration of risks, benefits and other options for treatment, the patient has consented to  Procedure(s): TRANSESOPHAGEAL ECHOCARDIOGRAM (TEE) (N/A) as a surgical intervention.  The patient's history has been reviewed, patient examined, no change in status, stable for surgery.  I have reviewed the patient's chart and labs.  Questions were answered to the patient's satisfaction.     Kristeen Miss

## 2020-08-30 NOTE — H&P (View-Only) (Signed)
Orders for TEE placed 

## 2020-08-30 NOTE — Progress Notes (Signed)
Orders for TEE placed 

## 2020-08-30 NOTE — Anesthesia Procedure Notes (Signed)
Procedure Name: MAC Date/Time: 08/30/2020 8:15 AM Performed by: Valda Favia, CRNA Pre-anesthesia Checklist: Patient identified, Emergency Drugs available, Suction available, Patient being monitored and Timeout performed Patient Re-evaluated:Patient Re-evaluated prior to induction Oxygen Delivery Method: Circle system utilized and Nasal cannula Preoxygenation: Pre-oxygenation with 100% oxygen Ventilation: Oral airway inserted - appropriate to patient size and Mask ventilation without difficulty Placement Confirmation: positive ETCO2 Dental Injury: Teeth and Oropharynx as per pre-operative assessment

## 2020-08-31 ENCOUNTER — Ambulatory Visit: Payer: Medicaid Other

## 2020-08-31 NOTE — Anesthesia Postprocedure Evaluation (Signed)
Anesthesia Post Note  Patient: Vincent Guerrero  Procedure(s) Performed: TRANSESOPHAGEAL ECHOCARDIOGRAM (TEE)     Patient location during evaluation: Endoscopy Anesthesia Type: MAC Level of consciousness: awake and alert Pain management: pain level controlled Vital Signs Assessment: post-procedure vital signs reviewed and stable Respiratory status: spontaneous breathing, nonlabored ventilation, respiratory function stable and patient connected to nasal cannula oxygen Cardiovascular status: stable and blood pressure returned to baseline Postop Assessment: no apparent nausea or vomiting Anesthetic complications: no   No notable events documented.  Last Vitals:  Vitals:   08/30/20 0851 08/30/20 0901  BP: 124/69 133/67  Pulse:  65  Resp: 14 16  Temp:    SpO2:      Last Pain:  Vitals:   08/30/20 0901  TempSrc:   PainSc: 0-No pain                 March Rummage Shanena Pellegrino

## 2020-09-01 ENCOUNTER — Other Ambulatory Visit: Payer: Self-pay

## 2020-09-01 ENCOUNTER — Telehealth: Payer: Self-pay | Admitting: Hematology and Oncology

## 2020-09-01 ENCOUNTER — Inpatient Hospital Stay (INDEPENDENT_AMBULATORY_CARE_PROVIDER_SITE_OTHER): Payer: Medicaid Other | Admitting: Hematology and Oncology

## 2020-09-01 ENCOUNTER — Other Ambulatory Visit: Payer: Medicaid Other

## 2020-09-01 ENCOUNTER — Other Ambulatory Visit: Payer: Self-pay | Admitting: Hematology and Oncology

## 2020-09-01 ENCOUNTER — Encounter: Payer: Self-pay | Admitting: Hematology and Oncology

## 2020-09-01 ENCOUNTER — Inpatient Hospital Stay: Payer: Medicaid Other

## 2020-09-01 ENCOUNTER — Encounter (HOSPITAL_COMMUNITY): Payer: Self-pay | Admitting: Cardiovascular Disease

## 2020-09-01 VITALS — BP 170/83 | HR 76 | Temp 99.0°F | Resp 18 | Ht 70.0 in | Wt 201.5 lb

## 2020-09-01 DIAGNOSIS — D500 Iron deficiency anemia secondary to blood loss (chronic): Secondary | ICD-10-CM | POA: Diagnosis not present

## 2020-09-01 DIAGNOSIS — D649 Anemia, unspecified: Secondary | ICD-10-CM

## 2020-09-01 DIAGNOSIS — D508 Other iron deficiency anemias: Secondary | ICD-10-CM | POA: Diagnosis not present

## 2020-09-01 LAB — LACTATE DEHYDROGENASE: LDH: 137 U/L (ref 98–192)

## 2020-09-01 LAB — HEPATIC FUNCTION PANEL
ALT: 14 (ref 10–40)
AST: 21 (ref 14–40)
Alkaline Phosphatase: 72 (ref 25–125)
Bilirubin, Total: 0.3

## 2020-09-01 LAB — BASIC METABOLIC PANEL
BUN: 23 — AB (ref 4–21)
CO2: 20 (ref 13–22)
Chloride: 109 — AB (ref 99–108)
Creatinine: 1.2 (ref 0.6–1.3)
Glucose: 121
Potassium: 4.9 (ref 3.4–5.3)
Sodium: 140 (ref 137–147)

## 2020-09-01 LAB — IRON AND TIBC
Iron: 48 ug/dL (ref 45–182)
Saturation Ratios: 14 % — ABNORMAL LOW (ref 17.9–39.5)
TIBC: 346 ug/dL (ref 250–450)
UIBC: 298 ug/dL

## 2020-09-01 LAB — CBC: RBC: 4.82 (ref 3.87–5.11)

## 2020-09-01 LAB — CBC AND DIFFERENTIAL
HCT: 38 — AB (ref 41–53)
Hemoglobin: 11.9 — AB (ref 13.5–17.5)
Neutrophils Absolute: 6.22
Platelets: 239 (ref 150–399)
WBC: 10.2

## 2020-09-01 LAB — FOLATE: Folate: 11.2 ng/mL (ref >5.9)

## 2020-09-01 LAB — COMPREHENSIVE METABOLIC PANEL
Albumin: 4.2 (ref 3.5–5.0)
Calcium: 9.3 (ref 8.7–10.7)

## 2020-09-01 LAB — FERRITIN: Ferritin: 40 ng/mL (ref 24–336)

## 2020-09-01 NOTE — Telephone Encounter (Signed)
Per 7/28 LOS, patient scheduled for Sept Appt's.  Gave patient Appt Summary

## 2020-09-01 NOTE — Progress Notes (Signed)
[facility]  [address] [place],  [place]  [address] [phone]  Clinic Day:  09/02/2020  Referring physician: Jearld Lesch, MD   CHIEF COMPLAINT:  CC:   Recurrent iron deficiency anemia  Current Treatment:   IV iron replacement with Feraheme   HISTORY OF PRESENT ILLNESS:  Vincent Guerrero is a 60 y.o. male with a history of recurrent severe iron deficiency anemia despite oral iron supplementation. He was seen by Dr. Bertis Ruddy on June 1st and his hemoglobin was 9.1 at that time. Iron studies were consistent with iron deficiency.  B12 was normal.  TSH was normal.  Vitamin-D was mildly low.  Review of his CBCs dating back to April 2018, revealed his hemoglobin ranged from 7.3-12.5, always with low MCV. Last year, he presented with severe anemia requiring blood transfusion. He underwent EGD and colonoscopy in October 2021. EGD revealed non-bleeding gastric ulcers.  He continued taking aspirin-containing products and using NSAIDs, so was instructed to stop these. The patient was symptomatic from anemia and the iron level was critically low. He was given IV iron in the form of Feraheme with a goal to correct his anemia and iron deficiency and avoid transfusion support.  INTERVAL HISTORY:  Vincent Guerrero is here today for repeat clinical assessment after receiving Feraheme.  He denies any overt form of blood loss.   He reports continued mild fatigue, dyspnea and occasional chest pain. He is followed by Dr. Servando Salina and she prescribed nitroglycerin, but he has not been able to get this filled as the pharmacy was out.  He had a TEE earlier this week, but has not received results. He denies fevers or chills. He denies pain. His appetite is good. His weight has been stable. He states he is no longer taking oral iron. He takes a multivitamin, but is not taking vitamin-D. Unfortunately, he continues to smoke.  REVIEW OF SYSTEMS:  Review of Systems  Constitutional:   Positive for fatigue. Negative for appetite change, chills, fever and unexpected weight change.  HENT:   Negative for lump/mass, mouth sores and sore throat.   Respiratory:  Positive for shortness of breath. Negative for cough.   Cardiovascular:  Positive for chest pain and leg swelling.  Gastrointestinal:  Negative for abdominal pain, constipation, diarrhea, nausea and vomiting.  Genitourinary:  Negative for difficulty urinating, dysuria, frequency and hematuria.   Musculoskeletal:  Negative for arthralgias, back pain and myalgias.  Skin:  Negative for itching, rash and wound.  Neurological:  Negative for dizziness, extremity weakness, headaches, light-headedness and numbness.  Hematological:  Negative for adenopathy.  Psychiatric/Behavioral:  Negative for depression and sleep disturbance. The patient is not nervous/anxious.     VITALS:  Blood pressure (!) 170/83, pulse 76, temperature 99 F (37.2 C), resp. rate 18, height 5\' 10"  (1.778 m), weight 201 lb 8 oz (91.4 kg), SpO2 99 %.  Wt Readings from Last 3 Encounters:  09/01/20 201 lb 8 oz (91.4 kg)  08/30/20 200 lb (90.7 kg)  08/01/20 200 lb 12.8 oz (91.1 kg)    Body mass index is 28.91 kg/m.  Performance status (ECOG): 1 - Symptomatic but completely ambulatory  PHYSICAL EXAM:  Physical Exam Vitals and nursing note reviewed.  Constitutional:      General: He is not in acute distress.    Appearance: Normal appearance. He is normal weight.  HENT:     Head: Normocephalic and atraumatic.     Mouth/Throat:     Mouth: Mucous membranes  are moist.     Pharynx: Oropharynx is clear. No oropharyngeal exudate or posterior oropharyngeal erythema.  Eyes:     General: No scleral icterus.    Extraocular Movements: Extraocular movements intact.     Conjunctiva/sclera: Conjunctivae normal.     Pupils: Pupils are equal, round, and reactive to light.  Cardiovascular:     Rate and Rhythm: Normal rate and regular rhythm.     Heart sounds:  Murmur heard.  Systolic murmur is present with a grade of 3/6.    No friction rub. No gallop.  Pulmonary:     Effort: Pulmonary effort is normal.     Breath sounds: Normal breath sounds. No wheezing, rhonchi or rales.  Chest:  Breasts:    Right: No axillary adenopathy or supraclavicular adenopathy.     Left: No axillary adenopathy or supraclavicular adenopathy.  Abdominal:     General: Bowel sounds are normal. There is no distension.     Palpations: Abdomen is soft. There is no hepatomegaly, splenomegaly or mass.     Tenderness: There is no abdominal tenderness.  Musculoskeletal:        General: Normal range of motion.     Cervical back: Normal range of motion and neck supple. No tenderness.     Right lower leg: No edema.     Left lower leg: No edema.  Lymphadenopathy:     Cervical: No cervical adenopathy.     Upper Body:     Right upper body: No supraclavicular or axillary adenopathy.     Left upper body: No supraclavicular or axillary adenopathy.     Lower Body: No right inguinal adenopathy. No left inguinal adenopathy.  Skin:    General: Skin is warm and dry.     Coloration: Skin is not jaundiced.     Findings: No rash.  Neurological:     Mental Status: He is alert and oriented to person, place, and time.     Cranial Nerves: No cranial nerve deficit.  Psychiatric:        Mood and Affect: Mood normal.        Behavior: Behavior normal.        Thought Content: Thought content normal.   LABS:   CBC Latest Ref Rng & Units 09/01/2020 08/29/2020 08/18/2020  WBC - 10.2 10.1 12.2(H)  Hemoglobin 13.5 - 17.5 11.9(A) 11.2(L) 10.9(L)  Hematocrit 41 - 53 38(A) 37.1(L) 34.6(L)  Platelets 150 - 399 239 229 213   CMP Latest Ref Rng & Units 09/01/2020 08/18/2020 06/10/2020  Glucose 65 - 99 mg/dL - 161(W) 960(A)  BUN 4 - 21 23(A) 21 26(H)  Creatinine 0.6 - 1.3 1.2 0.98 1.43(H)  Sodium 137 - 147 140 140 136  Potassium 3.4 - 5.3 4.9 4.5 4.4  Chloride 99 - 108 109(A) 105 104  CO2 13 - Calcium 8.7 - 10.7 9.3 9.3 8.9  Total Protein 6.5 - 8.1 g/dL - - 6.7  Total Bilirubin 0.3 - 1.2 mg/dL - - 0.4  Alkaline Phos 25 - 125 72 - 54  AST 14 - 40 21 - 21  ALT 10 - 40 14 - 12     No results found for: CEA1 / No results found for: CEA1 No results found for: PSA1 No results found for: VWU981 No results found for: XBJ478  No results found for: TOTALPROTELP, ALBUMINELP, A1GS, A2GS, BETS, BETA2SER, GAMS, MSPIKE, SPEI Lab Results  Component Value Date   TIBC 346 09/01/2020  TIBC 323 08/29/2020   TIBC 476 (H) 07/06/2020   FERRITIN 40 09/01/2020   FERRITIN 46 08/29/2020   FERRITIN 7 (L) 07/06/2020   IRONPCTSAT 14 (L) 09/01/2020   IRONPCTSAT 14 (L) 08/29/2020   IRONPCTSAT 5 (L) 07/06/2020   Lab Results  Component Value Date   LDH 137 09/01/2020    STUDIES:  ECHO TEE  Result Date: 08/30/2020    TRANSESOPHOGEAL ECHO REPORT   Patient Name:   Launa FlightCHRISTOPHER B Trampe Date of Exam: 08/30/2020 Medical Rec #:  [id number]           Height:       70.0 in Accession #:    [id number]          Weight:       200.0 lb Date of Birth:  [birth year]          BSA:          2.087 m Patient Age:    59 years            BP:           141/68 mmHg Patient Gender: M                   HR:           72 bpm. Exam Location:  Outpatient Procedure: Transesophageal Echo and Color Doppler Indications:     aortic stenosis  History:         Patient has prior history of Echocardiogram examinations, most                  recent 06/07/2020. CAD; Risk Factors:Hypertension, Dyslipidemia                  and Diabetes.  Sonographer:     Delcie RochLauren Pennington Referring Phys:  [phone] PHILIP J NAHSER Diagnosing Phys: Kristeen MissPhilip Nahser MD PROCEDURE: After discussion of the risks and benefits of a TEE, an informed consent was obtained from the patient. The transesophogeal probe was passed without difficulty through the esophogus of the patient. Local oropharyngeal anesthetic was provided with Cetacaine. Sedation performed by different  physician. Patients was under conscious sedation during this procedure. The patient developed no complications during the procedure. IMPRESSIONS  1. Left ventricular ejection fraction, by estimation, is 60 to 65%. The left ventricle has normal function. There is moderate left ventricular hypertrophy.  2. Right ventricular systolic function is normal. The right ventricular size is normal.  3. No left atrial/left atrial appendage thrombus was detected.  4. The mitral valve is grossly normal. Mild to moderate mitral valve regurgitation.  5. There was no mass seen on the Tricuspid valve.  6. The aortic valve is calcified. Aortic valve regurgitation is mild. Moderate to severe aortic valve stenosis. FINDINGS  Left Ventricle: Left ventricular ejection fraction, by estimation, is 60 to 65%. The left ventricle has normal function. The left ventricular internal cavity size was normal in size. There is moderate left ventricular hypertrophy. Right Ventricle: The right ventricular size is normal. Right vetricular wall thickness was not well visualized. Right ventricular systolic function is normal. Left Atrium: Left atrial size was normal in size. No left atrial/left atrial appendage thrombus was detected. Right Atrium: Right atrial size was normal in size. Pericardium: There is no evidence of pericardial effusion. Mitral Valve: The mitral valve is grossly normal. Mild to moderate mitral valve regurgitation. Tricuspid Valve: There was no mass seen on the Tricuspid valve. The tricuspid valve is normal in structure. Tricuspid  valve regurgitation is trivial. No evidence of tricuspid stenosis. There is no evidence of tricuspid valve vegetation. Aortic Valve: The aortic valve is calcified. Aortic valve regurgitation is mild. Moderate to severe aortic stenosis is present. Pulmonic Valve: The pulmonic valve was grossly normal. Pulmonic valve regurgitation is not visualized. Aorta: The aortic root and ascending aorta are structurally  normal, with no evidence of dilitation. IAS/Shunts: The atrial septum is grossly normal. Kristeen Miss MD Electronically signed by Kristeen Miss MD Signature Date/Time: 08/30/2020/4:16:21 PM    Final       HISTORY:   Past Medical History:  Diagnosis Date   Age-related nuclear cataract of both eyes 11/27/2018   Anemia    Bilateral recurrent inguinal hernia without obstruction or gangrene 05/09/2016   Added automatically from request for surgery [id number]   Chest pressure 11/06/2019   Coronary artery disease involving native artery of transplanted heart without angina pectoris    Cortical age-related cataract of both eyes 11/27/2018   Depression    Diabetes mellitus (HCC)    Hyperlipidemia LDL goal <70    Hypertension    Inguinal hernia without obstruction or gangrene    BILATERAL   Primary open angle glaucoma (POAG) of both eyes, severe stage 11/27/2018   Primary open angle glaucoma (POAG) of left eye, severe stage 11/27/2018   Tobacco abuse 11/05/2019   Tobacco use 11/06/2019    Past Surgical History:  Procedure Laterality Date   BIOPSY  11/27/2019   Procedure: BIOPSY;  Surgeon: Sherrilyn Rist, MD;  Location: [facility] ENDOSCOPY;  Service: Gastroenterology;;   COLONOSCOPY WITH PROPOFOL N/A 11/27/2019   Procedure: COLONOSCOPY WITH PROPOFOL;  Surgeon: Sherrilyn Rist, MD;  Location: [facility] ENDOSCOPY;  Service: Gastroenterology;  Laterality: N/A;   ESOPHAGOGASTRODUODENOSCOPY (EGD) WITH PROPOFOL N/A 11/27/2019   Procedure: ESOPHAGOGASTRODUODENOSCOPY (EGD) WITH PROPOFOL;  Surgeon: Sherrilyn Rist, MD;  Location: [facility] ENDOSCOPY;  Service: Gastroenterology;  Laterality: N/A;   HEMOSTASIS CLIP PLACEMENT  11/27/2019   Procedure: HEMOSTASIS CLIP PLACEMENT;  Surgeon: Sherrilyn Rist, MD;  Location: [facility] ENDOSCOPY;  Service: Gastroenterology;;   HERNIA REPAIR  1998   LEFT HEART CATH AND CORONARY ANGIOGRAPHY N/A 06/04/2016   Procedure: Left Heart Cath and Coronary Angiography;  Surgeon: Lyn Records,  MD;  Location: [facility] INVASIVE CV LAB;  Service: Cardiovascular;  Laterality: N/A;   TEE WITHOUT CARDIOVERSION N/A 08/30/2020   Procedure: TRANSESOPHAGEAL ECHOCARDIOGRAM (TEE);  Surgeon: Elease Hashimoto Deloris Ping, MD;  Location: [facility] ENDOSCOPY;  Service: Cardiovascular;  Laterality: N/A;    Family History  Problem Relation Age of Onset   Heart failure Mother    Hypertension Mother    CAD Father    Diabetes Father    Prostate cancer Brother     Social History:  reports that he has been smoking cigarettes. He has a 40.00 pack-year smoking history. His smokeless tobacco use includes chew. He reports current drug use. Drug: Marijuana. He reports that he does not drink alcohol.The patient is alone today.  Allergies:  Allergies  Allergen Reactions   Cetacaine [Butamben-Tetracaine-Benzocaine] Shortness Of Breath    Intense laryngospasm    Current Medications: Current Outpatient Medications  Medication Sig Dispense Refill   acetaminophen-codeine (TYLENOL #3) 300-30 MG tablet Take 1-2 tablets by mouth every 6 (six) hours as needed.     aspirin 325 MG tablet Take 325 mg by mouth every 6 (six) hours as needed (arthritis).     glipiZIDE (GLUCOTROL) 10 MG tablet Take 10 mg by mouth 2 (  two) times daily before a meal.      magnesium oxide (MAG-OX) 400 (240 Mg) MG tablet Take 1 tablet (400 mg total) by mouth 2 (two) times daily. 14 tablet 0   metFORMIN (GLUCOPHAGE) 1000 MG tablet Take 1,000 mg by mouth 2 (two) times daily with a meal.     metoprolol (LOPRESSOR) 50 MG tablet Take 1 tablet (50 mg total) by mouth 2 (two) times daily. 180 tablet 2   nitroGLYCERIN (NITROSTAT) 0.4 MG SL tablet Place 1 tablet (0.4 mg total) under the tongue every 5 (five) minutes as needed for chest pain. 90 tablet 3   pantoprazole (PROTONIX) 40 MG tablet Take 1 tablet (40 mg total) by mouth daily. 90 tablet 3   ranolazine (RANEXA) 500 MG 12 hr tablet Take 500 mg by mouth 2 (two) times daily.     valsartan (DIOVAN) 40 MG tablet Take 1  tablet (40 mg total) by mouth daily. 90 tablet 3   No current facility-administered medications for this visit.     ASSESSMENT & PLAN:   Assessment:  1. Iron deficiency anemia, felt to be due to chronic GI blood loss and malabsorption.  He has had good improvement in his iron stores with IV Feraheme. Although his hemoglobin has improved significantly, he remains anemic. Serum folate, LDH and haptoglobin were normal. Serum protein electrophoresis is pending.    2. Vitamin-D insufficiency, I advised him to start vitamin D3 1000 international units daily.I  Plan:  I will plan to contact him with the results of the additional testing. We will plan to see him back in 2 months with a CBC, comprehensive metabolic panel and iron studies for repeat clinical assessment. The patient understands the plans discussed today and is in agreement with them.  He knows to contact our office if he develops symptoms of worsening anemia or other concerns prior to his next appointment.     Adah Perl, PA-C

## 2020-09-02 ENCOUNTER — Encounter: Payer: Self-pay | Admitting: Hematology and Oncology

## 2020-09-02 LAB — HAPTOGLOBIN: Haptoglobin: 193 mg/dL (ref 29–370)

## 2020-09-05 ENCOUNTER — Encounter: Payer: Self-pay | Admitting: Surgery

## 2020-09-05 ENCOUNTER — Other Ambulatory Visit: Payer: Self-pay

## 2020-09-05 ENCOUNTER — Institutional Professional Consult (permissible substitution): Payer: Medicaid Other | Admitting: Surgery

## 2020-09-05 VITALS — BP 167/77 | HR 75 | Resp 20 | Ht 70.0 in | Wt 204.0 lb

## 2020-09-05 DIAGNOSIS — I350 Nonrheumatic aortic (valve) stenosis: Secondary | ICD-10-CM

## 2020-09-05 DIAGNOSIS — G8929 Other chronic pain: Secondary | ICD-10-CM | POA: Insufficient documentation

## 2020-09-05 DIAGNOSIS — I351 Nonrheumatic aortic (valve) insufficiency: Secondary | ICD-10-CM | POA: Diagnosis not present

## 2020-09-05 LAB — PROTEIN ELECTROPHORESIS, SERUM, WITH REFLEX
A/G Ratio: 1.4 (ref 0.7–1.7)
Albumin ELP: 3.7 g/dL (ref 2.9–4.4)
Alpha-1-Globulin: 0.2 g/dL (ref 0.0–0.4)
Alpha-2-Globulin: 0.7 g/dL (ref 0.4–1.0)
Beta Globulin: 0.9 g/dL (ref 0.7–1.3)
Gamma Globulin: 0.7 g/dL (ref 0.4–1.8)
Globulin, Total: 2.6 g/dL (ref 2.2–3.9)
Total Protein ELP: 6.3 g/dL (ref 6.0–8.5)

## 2020-09-05 NOTE — Progress Notes (Signed)
Patient ID: Vincent Guerrero, male   DOB: [birth year], 60 y.o.   MRN: [id number]  [facility]         [address]       Vincent Guerrero [id number]             [id number]          CARDIOTHORACIC SURGERY CONSULTATION REPORT  PCP is Jearld Lesch, MD Referring Provider is Thomasene Ripple, DO Primary Cardiologist is Thomasene Ripple, DO  Reason for consultation:  Severe aortic stenosis and moderate aortic insufficiency  HPI:  The patient is a 59 year old gentleman with history of diabetes, hypertension, hyperlipidemia, ongoing 1 pack/day smoking, severe open angle glaucoma, and coronary artery disease status post MI in 2010 treated with PCI and a stent and moderate aortic stenosis.  His last cardiac catheterization was in April 2018 which showed total occlusion of a nondominant right coronary artery that was previously stented.  There was 30% distal left main stenosis.  There is 40% proximal diffuse LAD narrowing and 60% ostial to proximal ramus stenosis.  The aortic valve peak to peak gradient at that time was 18 mmHg.  Left ventricular systolic function was normal.  He was treated medically.  He had a 2D echo on 10/14/2019 which was felt to show moderate aortic stenosis with a mean gradient of 38 mmHg and peak gradient of 71 mmHg.  There was moderate aortic insufficiency with a pressure half-time of 436 ms.  Left ventricular ejection fraction was 60 to 65% with moderate LVH.  He now presents with at least a several month history of exertional fatigue and shortness of breath.  He has had occasional episodes of chest tightness.  He reports positional dizziness when standing up.  He has had some ankle and pedal edema.  He denies PND and orthopnea.  He had a repeat 2D echo on 06/07/2020 showing calcified aortic valve with thickened leaflets with restricted mobility.  The mean gradient across aortic valve was 43 mmHg with a peak gradient of  71 mmHg.  Pressure half-time was 609 ms consistent with mild to moderate regurgitation.  There was a question of a 1.8 x 1.5 cm mass on the posterior leaflet tricuspid valve.  The mitral valve was unremarkable.  Left ventricular ejection fraction was 55 to 60% with severe concentric LVH and grade 3 diastolic dysfunction.  He subsequently underwent TEE to better examine the tricuspid valve on 08/30/2020.  This showed an ejection fraction of 60 to 65%.  There was no mass seen on the tricuspid valve.  Aortic valve was calcified with what was felt to be moderate to severe aortic stenosis and mild aortic insufficiency.  He has a history of GI bleeding in the past with anemia.  He underwent EGD and colonoscopy with colonoscopy being negative and EGD showing some nonbleeding gastric ulcers.  He is currently seeing Dr. Bertis Ruddy for treatment of recurrent severe iron deficiency anemia.  He was apparently in the emergency room at [facility] in June 2022 due to some chest discomfort.  His troponin was negative.  His pain was felt to be musculoskeletal.  The patient also tells me that he recently had a tooth extracted and was told by his dentist that he had a few teeth and cavities that need to be treated.    Past Medical History:  Diagnosis Date   Age-related nuclear cataract of both eyes 11/27/2018   Anemia    Bilateral  recurrent inguinal hernia without obstruction or gangrene 05/09/2016   Added automatically from request for surgery [id number]   Chest pressure 11/06/2019   Coronary artery disease involving native artery of transplanted heart without angina pectoris    Cortical age-related cataract of both eyes 11/27/2018   Depression    Diabetes mellitus (HCC)    Hyperlipidemia LDL goal <70    Hypertension    Inguinal hernia without obstruction or gangrene    BILATERAL   Primary open angle glaucoma (POAG) of both eyes, severe stage 11/27/2018   Primary open angle glaucoma (POAG) of left eye, severe stage 11/27/2018    Tobacco abuse 11/05/2019   Tobacco use 11/06/2019    Past Surgical History:  Procedure Laterality Date   BIOPSY  11/27/2019   Procedure: BIOPSY;  Surgeon: Sherrilyn Rist, MD;  Location: [facility] ENDOSCOPY;  Service: Gastroenterology;;   COLONOSCOPY WITH PROPOFOL N/A 11/27/2019   Procedure: COLONOSCOPY WITH PROPOFOL;  Surgeon: Sherrilyn Rist, MD;  Location: [facility] ENDOSCOPY;  Service: Gastroenterology;  Laterality: N/A;   ESOPHAGOGASTRODUODENOSCOPY (EGD) WITH PROPOFOL N/A 11/27/2019   Procedure: ESOPHAGOGASTRODUODENOSCOPY (EGD) WITH PROPOFOL;  Surgeon: Sherrilyn Rist, MD;  Location: [facility] ENDOSCOPY;  Service: Gastroenterology;  Laterality: N/A;   HEMOSTASIS CLIP PLACEMENT  11/27/2019   Procedure: HEMOSTASIS CLIP PLACEMENT;  Surgeon: Sherrilyn Rist, MD;  Location: [facility] ENDOSCOPY;  Service: Gastroenterology;;   HERNIA REPAIR  1998   LEFT HEART CATH AND CORONARY ANGIOGRAPHY N/A 06/04/2016   Procedure: Left Heart Cath and Coronary Angiography;  Surgeon: Lyn Records, MD;  Location: [facility] INVASIVE CV LAB;  Service: Cardiovascular;  Laterality: N/A;   TEE WITHOUT CARDIOVERSION N/A 08/30/2020   Procedure: TRANSESOPHAGEAL ECHOCARDIOGRAM (TEE);  Surgeon: Elease Hashimoto, Deloris Ping, MD;  Location: [facility] ENDOSCOPY;  Service: Cardiovascular;  Laterality: N/A;    Family History  Problem Relation Age of Onset   Heart failure Mother    Hypertension Mother    CAD Father    Diabetes Father    Prostate cancer Brother     Social History   Socioeconomic History   Marital status: Legally Separated    Spouse name: Not on file   Number of children: Not on file   Years of education: Not on file   Highest education level: Not on file  Occupational History   Not on file  Tobacco Use   Smoking status: Every Day    Packs/day: 1.00    Years: 40.00    Pack years: 40.00    Types: Cigarettes   Smokeless tobacco: Current    Types: Chew   Tobacco comments:    half pack daily-05/05/2020-AH  Vaping Use   Vaping Use:  Never used  Substance and Sexual Activity   Alcohol use: No   Drug use: Yes    Types: Marijuana   Sexual activity: Yes  Other Topics Concern   Not on file  Social History Narrative   Not on file   Social Determinants of Health   Financial Resource Strain: Not on file  Food Insecurity: Not on file  Transportation Needs: Not on file  Physical Activity: Not on file  Stress: Not on file  Social Connections: Not on file  Intimate Partner Violence: Not on file    Prior to Admission medications   Medication Sig Start Date End Date Taking? Authorizing Provider  acetaminophen-codeine (TYLENOL #3) 300-30 MG tablet Take 1-2 tablets by mouth every 6 (six) hours as needed. 08/02/20  Yes [provider]  aspirin  325 MG tablet Take 325 mg by mouth every 6 (six) hours as needed (arthritis).   Yes [provider]  glipiZIDE (GLUCOTROL) 10 MG tablet Take 10 mg by mouth 2 (two) times daily before a meal.  11/19/18  Yes [provider]  magnesium oxide (MAG-OX) 400 (240 Mg) MG tablet Take 1 tablet (400 mg total) by mouth 2 (two) times daily. 08/19/20  Yes Tobb, Kardie, DO  metFORMIN (GLUCOPHAGE) 1000 MG tablet Take 1,000 mg by mouth 2 (two) times daily with a meal.   Yes [provider]  metoprolol (LOPRESSOR) 50 MG tablet Take 1 tablet (50 mg total) by mouth 2 (two) times daily. 05/11/16  Yes Janetta Horahompson, Kathryn R, PA-C  nitroGLYCERIN (NITROSTAT) 0.4 MG SL tablet Place 1 tablet (0.4 mg total) under the tongue every 5 (five) minutes as needed for chest pain. 06/16/20 09/14/20 Yes Tobb, Kardie, DO  pantoprazole (PROTONIX) 40 MG tablet Take 1 tablet (40 mg total) by mouth daily. 06/02/20  Yes Tobb, Kardie, DO  ranolazine (RANEXA) 500 MG 12 hr tablet Take 500 mg by mouth 2 (two) times daily.   Yes [provider]  valsartan (DIOVAN) 40 MG tablet Take 1 tablet (40 mg total) by mouth daily. 08/01/20  Yes Tobb, Kardie, DO    Current Outpatient Medications  Medication Sig  Dispense Refill   acetaminophen-codeine (TYLENOL #3) 300-30 MG tablet Take 1-2 tablets by mouth every 6 (six) hours as needed.     aspirin 325 MG tablet Take 325 mg by mouth every 6 (six) hours as needed (arthritis).     glipiZIDE (GLUCOTROL) 10 MG tablet Take 10 mg by mouth 2 (two) times daily before a meal.      magnesium oxide (MAG-OX) 400 (240 Mg) MG tablet Take 1 tablet (400 mg total) by mouth 2 (two) times daily. 14 tablet 0   metFORMIN (GLUCOPHAGE) 1000 MG tablet Take 1,000 mg by mouth 2 (two) times daily with a meal.     metoprolol (LOPRESSOR) 50 MG tablet Take 1 tablet (50 mg total) by mouth 2 (two) times daily. 180 tablet 2   nitroGLYCERIN (NITROSTAT) 0.4 MG SL tablet Place 1 tablet (0.4 mg total) under the tongue every 5 (five) minutes as needed for chest pain. 90 tablet 3   pantoprazole (PROTONIX) 40 MG tablet Take 1 tablet (40 mg total) by mouth daily. 90 tablet 3   ranolazine (RANEXA) 500 MG 12 hr tablet Take 500 mg by mouth 2 (two) times daily.     valsartan (DIOVAN) 40 MG tablet Take 1 tablet (40 mg total) by mouth daily. 90 tablet 3   No current facility-administered medications for this visit.    Allergies  Allergen Reactions   Cetacaine [Butamben-Tetracaine-Benzocaine] Shortness Of Breath    Intense laryngospasm      Review of Systems:   General:  + loss of appetite, + decreased energy, no weight gain, no weight loss, no fever  Cardiac:  + chest pain with exertion, no chest pain at rest, +SOB with mild exertion, no resting SOB, no PND, no orthopnea, no palpitations, no arrhythmia, no atrial fibrillation, + LE edema, + dizzy spells, no syncope  Respiratory:  + exertional shortness of breath, no home oxygen, + productive cough, no dry cough, no bronchitis, + wheezing, no hemoptysis, no asthma, no pain with inspiration or cough, + sleep apnea, no CPAP at night  GI:   no difficulty swallowing, no reflux, no frequent heartburn, no hiatal hernia, no abdominal pain, +  constipation, no diarrhea, no hematochezia, no hematemesis, no melena  GU:   no dysuria,  + frequency, no urinary tract infection, no hematuria, no enlarged prostate, no kidney stones, no kidney disease  Vascular:  no pain suggestive of claudication, no pain in feet, + leg cramps, no varicose veins, no DVT, no non-healing foot ulcer  Neuro:   + stroke, no TIA's, no seizures, + headaches, no temporary blindness one eye,  no slurred speech, no peripheral neuropathy, + chronic pain, no instability of gait, + memory/cognitive dysfunction  Musculoskeletal: + arthritis - primarily involving the knees, no joint swelling, + myalgias, no difficulty walking, normalno mobility   Skin:   no rash, no itching, no skin infections, no pressure sores or ulcerations  Psych:   no anxiety, no depression, no nervousness, no unusual recent stress  Eyes:   no blurry vision, no floaters, no recent vision changes, no glasses or contacts  ENT:   no hearing loss, no loose or painful teeth, no dentures, last saw dentist last month  Hematologic:  no easy bruising, no abnormal bleeding, no clotting disorder, no frequent epistaxis  Endocrine:  + diabetes, does check CBG's at home     Physical Exam:   BP (!) 167/77 (BP Location: Right Arm, Patient Position: Sitting, Cuff Size: Normal)   Pulse 75   Resp 20   Ht 5\' 10"  (1.778 m)   Wt 204 lb (92.5 kg)   SpO2 97% Comment: RA  BMI 29.27 kg/m   General:  well-appearing  HEENT:  Unremarkable, NCAT, PERLA, EOMI  Neck:   no JVD, no bruits, transmitted murmur to both sides on neck, no adenopathy   Chest:   clear to auscultation, symmetrical breath sounds, no wheezes, no rhonchi   CV:   RRR, 3/6 systolic murmur RSB, soft S2 with 1/6 diastolic murmur LLSB  Abdomen:  soft, non-tender, no masses   Extremities:  warm, well-perfused, pulses palpable at ankle, no lower extremity edema  Rectal/GU  Deferred  Neuro:   Grossly non-focal and symmetrical throughout  Skin:   Clean and dry,  no rashes, no breakdown  Diagnostic Tests:  ECHOCARDIOGRAM REPORT         Patient Name:   Vincent Guerrero Date of Exam: 06/07/2020  Medical Rec #:  08/[id number]           Height:       70.0 in  Accession #:    [id number]          Weight:       205.4 lb  Date of Birth:  January 22, 1961          BSA:          2.111 m  Patient Age:    59 years            BP:           112/60 mmHg  Patient Gender: M                   HR:           61 bpm.  Exam Location:  [place]   Procedure: 2D Echo   Indications:    Shortness of breath [R06.02 (ICD-10-CM)]; Wheezing [R06.2                  (ICD-10-CM)]; Aortic valve stenosis, etiology of cardiac  valve                  disease unspecified [I35.0 (  ICD-10-CM)]     History:        Patient has no prior history of Echocardiogram  examinations.                  CAD; Risk Factors:Hypertension, Diabetes, Dyslipidemia and                  Tobacco abuse.     Sonographer:    Louie Boston  Referring Phys: [id number] KARDIE TOBB   IMPRESSIONS     1. Left ventricular ejection fraction, by estimation, is 55 to 60%. The  left ventricle has normal function. The left ventricle has no regional  wall motion abnormalities. There is severe concentric left ventricular  hypertrophy. Left ventricular diastolic   parameters are consistent with Grade III diastolic dysfunction  (restrictive).   2. Right ventricular systolic function is normal. The right ventricular  size is normal. There is normal pulmonary artery systolic pressure.   3. Left atrial size was mildly dilated.   4. The mitral valve is normal in structure. No evidence of mitral valve  regurgitation. No evidence of mitral stenosis.   5. There is a small (1.81 x 1.46 cm) mass on the posterior leaflet of the  tricuspid valve, best seen in the subcostal view (clip 64), which appears  to be present on previous echo done in 10/2019 (clip 68). May need further  cardiac imaging study (cardiac  MRI) for better  visualization.   6. The aortic valve is calcified. There is moderate calcification of the  aortic valve. Aortic valve regurgitation is mild to moderate. Moderate to  severe aortic valve stenosis.   7. 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   8. The inferior vena cava is normal in size with greater than 50%  respiratory variability, suggesting right atrial pressure of 3 mmHg.   Comparison(s): A prior study was performed on 10/2019. No significant  change from prior study.   FINDINGS   Left Ventricle: Left ventricular ejection fraction, by estimation, is 55  to 60%. The left ventricle has normal function. The left ventricle has no  regional wall motion abnormalities. Global longitudinal strain performed  but not reported based on  interpreter judgement due to suboptimal tracking. The left ventricular  internal cavity size was normal in size. There is severe concentric left  ventricular hypertrophy. Left ventricular diastolic parameters are  consistent with Grade III diastolic  dysfunction (restrictive).   Right Ventricle: The right ventricular size is normal. No increase in  right ventricular wall thickness. Right ventricular systolic function is  normal. There is normal pulmonary artery systolic pressure. The tricuspid  regurgitant velocity is 2.10 m/s, and   with an assumed right atrial pressure of 3 mmHg, the estimated right  ventricular systolic pressure is 20.6 mmHg.   Left Atrium: Left atrial size was mildly dilated.   Right Atrium: Right atrial size was normal in size.   Pericardium: There is no evidence of pericardial effusion.   Mitral Valve: The mitral valve is normal in structure. Mild mitral annular  calcification. No evidence of mitral valve regurgitation. No evidence of  mitral valve stenosis.   Tricuspid Valve: There is a small (1.81 x 1.46 cm) mass on the posterior  leaflet of the tricuspid valve, best seen in the subcostal view (clip 64),  which appears to be present  on previous echo done in 10/2019 (clip 68). May  need further cardiac imaging  study (cardiac MRI) for better visualization. The tricuspid valve is  normal in  structure. Tricuspid valve regurgitation is trivial. No evidence  of tricuspid stenosis.   Aortic Valve: The aortic valve is calcified. There is moderate  calcification of the aortic valve. There is mild aortic valve annular  calcification. Aortic valve regurgitation is mild to moderate. Aortic  regurgitation PHT measures 609 msec. Moderate to  severe aortic stenosis is present. Aortic valve mean gradient measures  43.0 mmHg. Aortic valve peak gradient measures 70.6 mmHg. Aortic valve  area, by VTI measures 1.25 cm.   Pulmonic Valve: The pulmonic valve was normal in structure. Pulmonic valve  regurgitation is not visualized. No evidence of pulmonic stenosis.   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 The aortic root is normal in size  and structure.   Venous: The inferior vena cava is normal in size with greater than 50%  respiratory variability, suggesting right atrial pressure of 3 mmHg.   IAS/Shunts: No atrial level shunt detected by color flow Doppler.      LEFT VENTRICLE  PLAX 2D  LVIDd:         5.00 cm  Diastology  LVIDs:         2.40 cm  LV e' medial:    5.11 cm/s  LV PW:         1.90 cm  LV E/e' medial:  20.7  LV IVS:        1.90 cm  LV e' lateral:   7.51 cm/s  LVOT diam:     2.40 cm  LV E/e' lateral: 14.1  LV SV:         126  LV SV Index:   60  LVOT Area:     4.52 cm      RIGHT VENTRICLE             IVC  RV S prime:     13.30 cm/s  IVC diam: 1.40 cm  TAPSE (M-mode): 3.0 cm   LEFT ATRIUM             Index       RIGHT ATRIUM           Index  LA diam:        3.70 cm 1.75 cm/m  RA Area:     14.50 cm  LA Vol (A2C):   99.9 ml 47.32 ml/m RA Volume:   32.60 ml  15.44 ml/m  LA Vol (A4C):   57.3 ml 27.14 ml/m  LA Biplane Vol: 78.6 ml 37.23 ml/m   AORTIC VALVE  AV Area (Vmax):    1.25 cm  AV Area (Vmean):    1.09 cm  AV Area (VTI):     1.25 cm  AV Vmax:           420.00 cm/s  AV Vmean:          306.000 cm/s  AV VTI:            1.010 m  AV Peak Grad:      70.6 mmHg  AV Mean Grad:      43.0 mmHg  LVOT Vmax:         116.00 cm/s  LVOT Vmean:        74.000 cm/s  LVOT VTI:          0.278 m  LVOT/AV VTI ratio: 0.28  AI PHT:            609 msec     AORTA  Ao Root diam: 3.50 cm  Ao Asc diam:  3.30 cm  Ao Desc diam: 2.25 cm   MITRAL VALVE                TRICUSPID VALVE  MV Area (PHT): 2.54 cm     TR Peak grad:   17.6 mmHg  MV Decel Time: 299 msec     TR Vmax:        210.00 cm/s  MV E velocity: 106.00 cm/s  MV A velocity: 125.00 cm/s  SHUNTS  MV E/A ratio:  0.85         Systemic VTI:  0.28 m                              Systemic Diam: 2.40 cm   Kardie Tobb DO  Electronically signed by Thomasene Ripple DO  Signature Date/Time: 06/07/2020/6:35:38 PM         Final     TRANSESOPHOGEAL ECHO REPORT         Patient Name:   Vincent Guerrero Date of Exam: 08/30/2020  Medical Rec #:  [id number]           Height:       70.0 in  Accession #:    [id number]          Weight:       200.0 lb  Date of Birth:  09-10-1960          BSA:          2.087 m  Patient Age:    59 years            BP:           141/68 mmHg  Patient Gender: M                   HR:           72 bpm.  Exam Location:  Outpatient   Procedure: Transesophageal Echo and Color Doppler   Indications:     aortic stenosis     History:         Patient has prior history of Echocardiogram examinations,  most                   recent 06/07/2020. CAD; Risk Factors:Hypertension,  Dyslipidemia                   and Diabetes.     Sonographer:     Delcie Roch  Referring Phys:  [phone] PHILIP J NAHSER  Diagnosing Phys: Kristeen Miss MD   PROCEDURE: After discussion of the risks and benefits of a TEE, an  informed consent was obtained from the patient. The transesophogeal probe  was passed without difficulty through the esophogus of the  patient. Local  oropharyngeal anesthetic was provided  with Cetacaine. Sedation performed by different physician. Patients was  under conscious sedation during this procedure. The patient developed no  complications during the procedure.   IMPRESSIONS     1. Left ventricular ejection fraction, by estimation, is 60 to 65%. The  left ventricle has normal function. There is moderate left ventricular  hypertrophy.   2. Right ventricular systolic function is normal. The right ventricular  size is normal.   3. No left atrial/left atrial appendage thrombus was detected.   4. The mitral valve is grossly normal. Mild to moderate mitral valve  regurgitation.   5. There was no mass seen on the Tricuspid valve.   6. The  aortic valve is calcified. Aortic valve regurgitation is mild.  Moderate to severe aortic valve stenosis.   FINDINGS   Left Ventricle: Left ventricular ejection fraction, by estimation, is 60  to 65%. The left ventricle has normal function. The left ventricular  internal cavity size was normal in size. There is moderate left  ventricular hypertrophy.   Right Ventricle: The right ventricular size is normal. Right vetricular  wall thickness was not well visualized. Right ventricular systolic  function is normal.   Left Atrium: Left atrial size was normal in size. No left atrial/left  atrial appendage thrombus was detected.   Right Atrium: Right atrial size was normal in size.   Pericardium: There is no evidence of pericardial effusion.   Mitral Valve: The mitral valve is grossly normal. Mild to moderate mitral  valve regurgitation.   Tricuspid Valve: There was no mass seen on the Tricuspid valve. The  tricuspid valve is normal in structure. Tricuspid valve regurgitation is  trivial. No evidence of tricuspid stenosis. There is no evidence of  tricuspid valve vegetation.   Aortic Valve: The aortic valve is calcified. Aortic valve regurgitation is  mild. Moderate to  severe aortic stenosis is present.   Pulmonic Valve: The pulmonic valve was grossly normal. Pulmonic valve  regurgitation is not visualized.   Aorta: The aortic root and ascending aorta are structurally normal, with  no evidence of dilitation.   IAS/Shunts: The atrial septum is grossly normal.   Kristeen Miss MD  Electronically signed by Kristeen Miss MD  Signature Date/Time: 08/30/2020/4:16:21 PM         Final     Impression:  This 60 year old gentleman has stage D, severe, symptomatic aortic stenosis and moderate aortic insufficiency with New York Heart Association class II symptoms of exertional fatigue and shortness of breath consistent with chronic diastolic congestive heart failure.  He also has a history of coronary artery disease with an inferior MI in 2010 treated with a stent and subsequent known occlusion of the right coronary artery by catheterization in 2018.  I have personally reviewed his 2D echocardiogram and TEE.  His echocardiogram shows a calcified aortic valve with restricted leaflet mobility.  The mean gradient is 43 mmHg consistent with severe aortic stenosis.  The pressure half-time was 609 ms suggesting mild to moderate aortic insufficiency although visually looks moderate to me.  He has at least moderate concentric LVH with normal left ventricular systolic function.  I agree that aortic valve replacement is indicated to relieve his symptoms and prevent progressive left ventricular deterioration.  Given his age I think open surgical aortic valve replacement is the best treatment for him.  He will require cardiac catheterization before surgery.  I discussed the alternatives of mechanical and bioprosthetic valves.  I recommend a bioprosthetic valve given his history of anemia and GI bleeding and his age of almost 60 years old.  He is in agreement with that.  I reviewed his echo and TEE images with him and answered all of his questions.  I stressed the importance of smoking  cessation preoperatively and preventing complications such as pneumonia and sternal nonhealing as well as long-term risks of smoking.  I discussed the operative procedure with the patient and family including alternatives, benefits and risks; including but not limited to bleeding, blood transfusion, infection, stroke, myocardial infarction, graft failure, heart block requiring a permanent pacemaker, organ dysfunction, and death.  [name] understands and agrees to proceed.    Plan:  He will be  scheduled for cardiac catheterization and once that has been completed can be scheduled for aortic valve replacement.  I spent 60 minutes performing this consultation and > 50% of this time was spent face to face counseling and coordinating the care of this patient's severe symptomatic aortic stenosis.   Alleen Borne, MD 09/05/2020 3:40 PM

## 2020-09-06 ENCOUNTER — Telehealth: Payer: Self-pay

## 2020-09-07 ENCOUNTER — Telehealth: Payer: Self-pay | Admitting: Cardiovascular Disease

## 2020-09-07 DIAGNOSIS — I340 Nonrheumatic mitral (valve) insufficiency: Secondary | ICD-10-CM | POA: Insufficient documentation

## 2020-09-07 DIAGNOSIS — I208 Other forms of angina pectoris: Secondary | ICD-10-CM | POA: Insufficient documentation

## 2020-09-07 DIAGNOSIS — E785 Hyperlipidemia, unspecified: Secondary | ICD-10-CM

## 2020-09-07 DIAGNOSIS — E119 Type 2 diabetes mellitus without complications: Secondary | ICD-10-CM | POA: Insufficient documentation

## 2020-09-07 DIAGNOSIS — I10 Essential (primary) hypertension: Secondary | ICD-10-CM | POA: Insufficient documentation

## 2020-09-07 DIAGNOSIS — Z955 Presence of coronary angioplasty implant and graft: Secondary | ICD-10-CM

## 2020-09-07 DIAGNOSIS — I078 Other rheumatic tricuspid valve diseases: Secondary | ICD-10-CM | POA: Insufficient documentation

## 2020-09-07 DIAGNOSIS — I2510 Atherosclerotic heart disease of native coronary artery without angina pectoris: Secondary | ICD-10-CM

## 2020-09-07 NOTE — Telephone Encounter (Signed)
Dr. Servando Salina pt.  Vincent Guerrero did TEE.

## 2020-09-07 NOTE — Telephone Encounter (Signed)
Attempted to call provided number twice, busy signal. Unable to leave a message.

## 2020-09-07 NOTE — Telephone Encounter (Signed)
Wants to talk with Dr. Elease Hashimoto or nurse regarding patient

## 2020-09-08 NOTE — Telephone Encounter (Signed)
Left message for patient to return the call.

## 2020-09-09 ENCOUNTER — Telehealth: Payer: Self-pay | Admitting: Cardiology

## 2020-09-09 ENCOUNTER — Ambulatory Visit: Payer: Medicaid Other | Admitting: Cardiology

## 2020-09-09 NOTE — Telephone Encounter (Signed)
I had a lengthy conversation with the patient this morning.  We talked about his visit with the CT surgeon.  He understands that he needs to proceed with cardiac surgery. I advised the patient that he does need a left heart catheterization.  He is agreeable to proceed with a left heart catheterization in preparation for his CT surgery. The patient understands that risks include but are not limited to stroke (1 in 1000), death (1 in 1000), kidney failure [usually temporary] (1 in 500), bleeding (1 in 200), allergic reaction [possibly serious] (1 in 200), and agrees to proceed.

## 2020-09-09 NOTE — Telephone Encounter (Signed)
Called patient. Patient made aware of his cath appointment. Went over instructions with him.  Verbalized understanding. No questions or concerns expressed at this time.

## 2020-09-10 ENCOUNTER — Encounter (HOSPITAL_COMMUNITY): Payer: Self-pay | Admitting: Cardiology

## 2020-09-12 ENCOUNTER — Encounter (HOSPITAL_COMMUNITY)
Admission: RE | Disposition: A | Discharge status: Home / Self Care | Payer: Self-pay | Source: Home / Self Care | Attending: Cardiology

## 2020-09-12 ENCOUNTER — Other Ambulatory Visit: Payer: Self-pay

## 2020-09-12 ENCOUNTER — Ambulatory Visit (HOSPITAL_COMMUNITY)
Admission: RE | Admit: 2020-09-12 | Discharge: 2020-09-12 | Disposition: A | Discharge status: Home / Self Care | Payer: Medicaid Other | Attending: Cardiology | Admitting: Cardiology

## 2020-09-12 DIAGNOSIS — I2510 Atherosclerotic heart disease of native coronary artery without angina pectoris: Secondary | ICD-10-CM | POA: Diagnosis not present

## 2020-09-12 DIAGNOSIS — F17210 Nicotine dependence, cigarettes, uncomplicated: Secondary | ICD-10-CM | POA: Insufficient documentation

## 2020-09-12 DIAGNOSIS — I350 Nonrheumatic aortic (valve) stenosis: Secondary | ICD-10-CM

## 2020-09-12 DIAGNOSIS — Z79899 Other long term (current) drug therapy: Secondary | ICD-10-CM | POA: Insufficient documentation

## 2020-09-12 DIAGNOSIS — Z955 Presence of coronary angioplasty implant and graft: Secondary | ICD-10-CM | POA: Insufficient documentation

## 2020-09-12 DIAGNOSIS — E119 Type 2 diabetes mellitus without complications: Secondary | ICD-10-CM | POA: Diagnosis not present

## 2020-09-12 DIAGNOSIS — I2582 Chronic total occlusion of coronary artery: Secondary | ICD-10-CM | POA: Insufficient documentation

## 2020-09-12 DIAGNOSIS — Z7984 Long term (current) use of oral hypoglycemic drugs: Secondary | ICD-10-CM | POA: Diagnosis not present

## 2020-09-12 DIAGNOSIS — E785 Hyperlipidemia, unspecified: Secondary | ICD-10-CM | POA: Diagnosis not present

## 2020-09-12 HISTORY — PX: RIGHT/LEFT HEART CATH AND CORONARY ANGIOGRAPHY: CATH118266

## 2020-09-12 HISTORY — PX: INTRAVASCULAR ULTRASOUND/IVUS: CATH118244

## 2020-09-12 LAB — POCT I-STAT EG7
Acid-Base Excess: 0 mmol/L (ref 0.0–2.0)
Acid-base deficit: 1 mmol/L (ref 0.0–2.0)
Bicarbonate: 25.5 mmol/L (ref 20.0–28.0)
Bicarbonate: 26.1 mmol/L (ref 20.0–28.0)
Calcium, Ion: 1.23 mmol/L (ref 1.15–1.40)
Calcium, Ion: 1.23 mmol/L (ref 1.15–1.40)
HCT: 33 % — ABNORMAL LOW (ref 39.0–52.0)
HCT: 32 % — ABNORMAL LOW (ref 39.0–52.0)
Hemoglobin: 11.2 g/dL — ABNORMAL LOW (ref 13.0–17.0)
Hemoglobin: 10.9 g/dL — ABNORMAL LOW (ref 13.0–17.0)
O2 Saturation: 67 %
O2 Saturation: 65 %
Potassium: 3.9 mmol/L (ref 3.5–5.1)
Potassium: 3.9 mmol/L (ref 3.5–5.1)
Sodium: 142 mmol/L (ref 135–145)
Sodium: 141 mmol/L (ref 135–145)
TCO2: 27 mmol/L (ref 22–32)
TCO2: 28 mmol/L (ref 22–32)
pCO2, Ven: 47.7 mmHg (ref 44.0–60.0)
pCO2, Ven: 48.4 mmHg (ref 44.0–60.0)
pH, Ven: 7.335 (ref 7.250–7.430)
pH, Ven: 7.339 (ref 7.250–7.430)
pO2, Ven: 37 mmHg (ref 32.0–45.0)
pO2, Ven: 36 mmHg (ref 32.0–45.0)

## 2020-09-12 LAB — POCT I-STAT 7, (LYTES, BLD GAS, ICA,H+H)
Acid-base deficit: 1 mmol/L (ref 0.0–2.0)
Bicarbonate: 25.3 mmol/L (ref 20.0–28.0)
Calcium, Ion: 1.21 mmol/L (ref 1.15–1.40)
HCT: 32 % — ABNORMAL LOW (ref 39.0–52.0)
Hemoglobin: 10.9 g/dL — ABNORMAL LOW (ref 13.0–17.0)
O2 Saturation: 99 %
Potassium: 4 mmol/L (ref 3.5–5.1)
Sodium: 141 mmol/L (ref 135–145)
TCO2: 27 mmol/L (ref 22–32)
pCO2 arterial: 46 mmHg (ref 32.0–48.0)
pH, Arterial: 7.349 — ABNORMAL LOW (ref 7.350–7.450)
pO2, Arterial: 152 mmHg — ABNORMAL HIGH (ref 83.0–108.0)

## 2020-09-12 LAB — POCT ACTIVATED CLOTTING TIME
Activated Clotting Time: 208 seconds
Activated Clotting Time: 242 seconds

## 2020-09-12 LAB — GLUCOSE, CAPILLARY
Glucose-Capillary: 55 mg/dL — ABNORMAL LOW (ref 70–99)
Glucose-Capillary: 96 mg/dL (ref 70–99)
Glucose-Capillary: 50 mg/dL — ABNORMAL LOW (ref 70–99)
Glucose-Capillary: 71 mg/dL (ref 70–99)

## 2020-09-12 SURGERY — RIGHT/LEFT HEART CATH AND CORONARY ANGIOGRAPHY
Anesthesia: LOCAL

## 2020-09-12 MED ORDER — MIDAZOLAM HCL 2 MG/2ML IJ SOLN
INTRAMUSCULAR | Status: DC | PRN
  Administered 2020-09-12: 1 mg via INTRAVENOUS

## 2020-09-12 MED ORDER — HEPARIN (PORCINE) IN NACL 1000-0.9 UT/500ML-% IV SOLN
INTRAVENOUS | Status: AC
  Filled 2020-09-12: qty 1000

## 2020-09-12 MED ORDER — SODIUM CHLORIDE 0.9 % WEIGHT BASED INFUSION
3.0000 mL/kg/h | INTRAVENOUS | Status: AC
  Administered 2020-09-12: 3 mL/kg/h via INTRAVENOUS

## 2020-09-12 MED ORDER — FENTANYL CITRATE (PF) 100 MCG/2ML IJ SOLN
INTRAMUSCULAR | Status: AC
  Filled 2020-09-12: qty 2

## 2020-09-12 MED ORDER — HYDRALAZINE HCL 20 MG/ML IJ SOLN: 10.0000 mg | INTRAMUSCULAR | Status: DC | PRN

## 2020-09-12 MED ORDER — SODIUM CHLORIDE 0.9% FLUSH: 3.0000 mL | INTRAVENOUS | Status: DC | PRN

## 2020-09-12 MED ORDER — DEXTROSE 50 % IV SOLN
INTRAVENOUS | Status: AC
  Filled 2020-09-12: qty 50

## 2020-09-12 MED ORDER — FENTANYL CITRATE (PF) 100 MCG/2ML IJ SOLN
INTRAMUSCULAR | Status: DC | PRN
  Administered 2020-09-12: 25 ug via INTRAVENOUS

## 2020-09-12 MED ORDER — SODIUM CHLORIDE 0.9 % IV SOLN: 250.0000 mL | INTRAVENOUS | Status: DC | PRN

## 2020-09-12 MED ORDER — HEPARIN SODIUM (PORCINE) 1000 UNIT/ML IJ SOLN
INTRAMUSCULAR | Status: AC
  Filled 2020-09-12: qty 1

## 2020-09-12 MED ORDER — VERAPAMIL HCL 2.5 MG/ML IV SOLN
INTRAVENOUS | Status: DC | PRN
  Administered 2020-09-12: 10 mL via INTRA_ARTERIAL

## 2020-09-12 MED ORDER — SODIUM CHLORIDE 0.9 % WEIGHT BASED INFUSION: 1.0000 mL/kg/h | INTRAVENOUS | Status: DC

## 2020-09-12 MED ORDER — MIDAZOLAM HCL 2 MG/2ML IJ SOLN
INTRAMUSCULAR | Status: AC
  Filled 2020-09-12: qty 2

## 2020-09-12 MED ORDER — ONDANSETRON HCL 4 MG/2ML IJ SOLN: 4.0000 mg | Freq: Four times a day (QID) | INTRAMUSCULAR | Status: DC | PRN

## 2020-09-12 MED ORDER — VERAPAMIL HCL 2.5 MG/ML IV SOLN
INTRAVENOUS | Status: AC
  Filled 2020-09-12: qty 2

## 2020-09-12 MED ORDER — HEPARIN SODIUM (PORCINE) 1000 UNIT/ML IJ SOLN
INTRAMUSCULAR | Status: DC | PRN
  Administered 2020-09-12 (×3): 4500 [IU] via INTRAVENOUS

## 2020-09-12 MED ORDER — HEPARIN (PORCINE) IN NACL 1000-0.9 UT/500ML-% IV SOLN
INTRAVENOUS | Status: DC | PRN
  Administered 2020-09-12 (×2): 500 mL

## 2020-09-12 MED ORDER — SODIUM CHLORIDE 0.9% FLUSH: 3.0000 mL | Freq: Two times a day (BID) | INTRAVENOUS | Status: DC

## 2020-09-12 MED ORDER — IOHEXOL 350 MG/ML SOLN
INTRAVENOUS | Status: DC | PRN
  Administered 2020-09-12: 105 mL

## 2020-09-12 MED ORDER — DEXTROSE 50 % IV SOLN
12.5000 g | INTRAVENOUS | Status: AC
  Administered 2020-09-12: 12.5 g via INTRAVENOUS

## 2020-09-12 MED ORDER — LABETALOL HCL 5 MG/ML IV SOLN: 10.0000 mg | INTRAVENOUS | Status: DC | PRN

## 2020-09-12 MED ORDER — LIDOCAINE HCL (PF) 1 % IJ SOLN
INTRAMUSCULAR | Status: AC
  Filled 2020-09-12: qty 30

## 2020-09-12 MED ORDER — SODIUM CHLORIDE 0.9 % IV SOLN: INTRAVENOUS | Status: DC

## 2020-09-12 MED ORDER — LIDOCAINE HCL (PF) 1 % IJ SOLN
INTRAMUSCULAR | Status: DC | PRN
  Administered 2020-09-12 (×2): 2 mL

## 2020-09-12 MED ORDER — ACETAMINOPHEN 325 MG PO TABS: 650.0000 mg | ORAL_TABLET | ORAL | Status: DC | PRN

## 2020-09-12 MED ORDER — ASPIRIN 81 MG PO CHEW: 81.0000 mg | CHEWABLE_TABLET | ORAL | Status: DC

## 2020-09-12 SURGICAL SUPPLY — 19 items
CATH BALLN WEDGE 5F 110CM (CATHETERS) ×2 IMPLANT
CATH INFINITI JR4 5F (CATHETERS) ×2 IMPLANT
CATH LAUNCHER 6FR JL3.5 (CATHETERS) ×2 IMPLANT
CATH OPTICROSS HD (CATHETERS) ×2 IMPLANT
CATH OPTITORQUE TIG 4.0 5F (CATHETERS) ×2 IMPLANT
DEVICE RAD COMP TR BAND LRG (VASCULAR PRODUCTS) ×2 IMPLANT
GLIDESHEATH SLEND SS 6F .021 (SHEATH) ×2 IMPLANT
GUIDEWIRE INQWIRE 1.5J.035X260 (WIRE) ×1 IMPLANT
INQWIRE 1.5J .035X260CM (WIRE) ×2
KIT ENCORE 26 ADVANTAGE (KITS) ×2 IMPLANT
KIT HEART LEFT (KITS) ×2 IMPLANT
PACK CARDIAC CATHETERIZATION (CUSTOM PROCEDURE TRAY) ×2 IMPLANT
SHEATH GLIDE SLENDER 4/5FR (SHEATH) ×2 IMPLANT
SLED PULL BACK IVUS (MISCELLANEOUS) ×2 IMPLANT
TRANSDUCER W/STOPCOCK (MISCELLANEOUS) ×2 IMPLANT
TUBING CIL FLEX 10 FLL-RA (TUBING) ×2 IMPLANT
WIRE ASAHI PROWATER 180CM (WIRE) ×2 IMPLANT
WIRE EMERALD 3MM-J .025X260CM (WIRE) ×2 IMPLANT
WIRE EMERALD ST .035X260CM (WIRE) ×2 IMPLANT

## 2020-09-12 NOTE — Progress Notes (Signed)
Pt ambulated without difficulty or bleeding.   Discharged home with his daughter who will drive and stay with pt x 24 hrs. 

## 2020-09-12 NOTE — Discharge Instructions (Signed)
Radial Site Care  This sheet gives you information about how to care for yourself after your procedure. Your health care provider may also give you more specific instructions. If you have problems or questions, contact your health care provider. What can I expect after the procedure? After the procedure, it is common to have: Bruising and tenderness at the catheter insertion area. Follow these instructions at home: Medicines Take over-the-counter and prescription medicines only as told by your health care provider. Insertion site care Follow instructions from your health care provider about how to take care of your insertion site. Make sure you: Wash your hands with soap and water before you remove your bandage (dressing). If soap and water are not available, use hand sanitizer. May remove dressing in 24 hours. Check your insertion site every day for signs of infection. Check for: Redness, swelling, or pain. Fluid or blood. Pus or a bad smell. Warmth. Do no take baths, swim, or use a hot tub for 5 days. You may shower 24-48 hours after the procedure. Remove the dressing and gently wash the site with plain soap and water. Pat the area dry with a clean towel. Do not rub the site. That could cause bleeding. Do not apply powder or lotion to the site. Activity  For 24 hours after the procedure, or as directed by your health care provider: Do not flex or bend the affected arm. Do not push or pull heavy objects with the affected arm. Do not drive yourself home from the hospital or clinic. You may drive 24 hours after the procedure. Do not operate machinery or power tools. KEEP ARM ELEVATED THE REMAINDER OF THE DAY. Do not push, pull or lift anything that is heavier than 10 lb for 5 days. Ask your health care provider when it is okay to: Return to work or school. Resume usual physical activities or sports. Resume sexual activity. General instructions If the catheter site starts to  bleed, raise your arm and put firm pressure on the site. If the bleeding does not stop, get help right away. This is a medical emergency. DRINK PLENTY OF FLUIDS FOR THE NEXT 2-3 DAYS. No alcohol consumption for 24 hours after receiving sedation. If you went home on the same day as your procedure, a responsible adult should be with you for the first 24 hours after you arrive home. Keep all follow-up visits as told by your health care provider. This is important. Contact a health care provider if: You have a fever. You have redness, swelling, or yellow drainage around your insertion site. Get help right away if: You have unusual pain at the radial site. The catheter insertion area swells very fast. The insertion area is bleeding, and the bleeding does not stop when you hold steady pressure on the area. Your arm or hand becomes pale, cool, tingly, or numb. These symptoms may represent a serious problem that is an emergency. Do not wait to see if the symptoms will go away. Get medical help right away. Call your local emergency services (911 in the [place] Do not drive yourself to the hospital. Summary After the procedure, it is common to have bruising and tenderness at the site. Follow instructions from your health care provider about how to take care of your radial site wound. Check the wound every day for signs of infection.  This information is not intended to replace advice given to you by your health care provider. Make sure you discuss any questions you have with   your health care provider. Document Revised: 02/27/2017 Document Reviewed: 02/27/2017 [employer] Patient Education  2020 [employer]  

## 2020-09-12 NOTE — Interval H&P Note (Signed)
History and Physical Interval Note:  09/12/2020 9:57 AM  Vincent Guerrero  has presented today for surgery, with the diagnosis of aortic valve stenosis.  The various methods of treatment have been discussed with the patient and family. After consideration of risks, benefits and other options for treatment, the patient has consented to  Procedure(s): RIGHT/LEFT HEART CATH AND CORONARY ANGIOGRAPHY (N/A) as a surgical intervention.  The patient's history has been reviewed, patient examined, no change in status, stable for surgery.  I have reviewed the patient's chart and labs.  Questions were answered to the patient's satisfaction.     Bryan Lemma

## 2020-09-13 ENCOUNTER — Encounter (HOSPITAL_COMMUNITY): Payer: Self-pay | Admitting: Cardiology

## 2020-09-15 ENCOUNTER — Encounter: Payer: Self-pay | Admitting: *Deleted

## 2020-09-15 ENCOUNTER — Other Ambulatory Visit: Payer: Self-pay | Admitting: *Deleted

## 2020-09-15 DIAGNOSIS — I350 Nonrheumatic aortic (valve) stenosis: Secondary | ICD-10-CM

## 2020-09-15 DIAGNOSIS — I2510 Atherosclerotic heart disease of native coronary artery without angina pectoris: Secondary | ICD-10-CM

## 2020-09-15 DIAGNOSIS — I351 Nonrheumatic aortic (valve) insufficiency: Secondary | ICD-10-CM

## 2020-09-19 DIAGNOSIS — R7309 Other abnormal glucose: Secondary | ICD-10-CM | POA: Insufficient documentation

## 2020-09-29 NOTE — H&P (Signed)
History and Physical Interval Note:   09/12/2020 9:57 AM   Vincent Guerrero  has presented today for surgery, with the diagnosis of aortic valve stenosis.  The various methods of treatment have been discussed with the patient and family. After consideration of risks, benefits and other options for treatment, the patient has consented to  Procedure(s): RIGHT/LEFT HEART CATH AND CORONARY ANGIOGRAPHY (N/A) as a surgical intervention.  The patient's history has been reviewed, patient examined, no change in status, stable for surgery.  I have reviewed the patient's chart and labs.  Questions were answered to the patient's satisfaction.       Yaakov Guthrie, DO   Physician  Specialty:  Cardiology  H&P [facility])      Signed  Date of Service:  08/01/2020  8:00 AM       Related encounter: Admission (Discharged) from 08/30/2020 in [facility] ENDOSCOPY       Signed      Expand All Collapse All  Cardiology Office Note:     Date:  08/24/2020    ID:  Vincent Guerrero, DOB October 11, 1960, MRN [id number]   PCP:  Jearld Lesch, MD            Cardiologist:  Thomasene Ripple, DO  Electrophysiologist:  None    Referring MD: Simone Curia, MD    No chief complaint on file. I am doing fine   History of Present Illness:     Vincent Guerrero is a  60 y.o. male with a hx of coronary artery disease status post stent in the RCA a result of an MI in 2010, hyperlipidemia (not on statins due to statin intolerance), diabetes mellitus (on Metformin and glipizide),, current smoker CVA in 2011, moderate aortic stenosis in April 2018.    In October the patient was short of breath were concerned that this may have been coronary artery disease therefore I set the patient up for a left heart catheterization but unfortunately his hemoglobin was significantly low.    He was seen by with GI he has had a colonoscopy which showed some ulcers.     When I saw the patient in March 2022 he had stopped his Repatha as well as his Imdur.  He felt both of these medication was playing with his mind.  I therefore placed the patient on Ranexa to help with any angina symptoms.  He had some wheezing that they and I referred the patient to pulmonary as he was not treated for likely undiagnosed COPD given his long smoking history.  His blood pressure was also elevated that day when asked the patient said he had not taken his blood pressure medication.   I saw the patient on June 02, 2020 he had complained of some shortness of breath and chest tightness at the time my office he has been wheezing.  He reported he had not been using his inhalers.  Placed a referral for pulmonary as well.  During that visit he had not been taking the Ranexa as recommended and he had previously stopped the Imdur.  His hemoglobin was also low during that visit which appeared to be dropping.  And he had stopped the Protonix.  The patient was here with his fiance that day I explained to him that for his angina he needed to stay on the Ranexa and also hopefully if he could tolerate the Imdur.  I restarted on the Ranexa at that visit.  I also explained to the patient that giving his history of GI bleeding he would need to stay on the Protonix.     I saw the patient in May 2022 at that time we will set up for  him to see GI as well as hematology.  He missed his GI appointment but was able to see hematology and has been started on iron treatment.   He says that the shortness of breath has improved.  Last week he ended up in the emergency department at [facility] where his troponins was negative.  He was noted to have musculoskeletal pain he was given some lidocaine and Tylenol which helped his pain.   Today he offers no complaints.  He is looking for a new primary care doctor.       Past Medical History:  Diagnosis Date   Age-related nuclear cataract of both eyes 11/27/2018   Anemia     Bilateral recurrent inguinal hernia without obstruction or gangrene 05/09/2016    Added automatically from request for surgery  [id number]   Chest pressure 11/06/2019   Coronary artery disease involving native artery of transplanted heart without angina pectoris     Cortical age-related cataract of both eyes 11/27/2018   Depression     Diabetes mellitus (HCC)     Hyperlipidemia LDL goal <70     Hypertension     Inguinal hernia without obstruction or gangrene      BILATERAL   Primary open angle glaucoma (POAG) of both eyes, severe stage 11/27/2018   Primary open angle glaucoma (POAG) of left eye, severe stage 11/27/2018   Tobacco abuse 11/05/2019   Tobacco use 11/06/2019           Past Surgical History:  Procedure Laterality Date   BIOPSY   11/27/2019    Procedure: BIOPSY;  Surgeon: Sherrilyn Rist, MD;  Location: [facility] ENDOSCOPY;  Service: Gastroenterology;;   COLONOSCOPY WITH PROPOFOL N/A 11/27/2019    Procedure: COLONOSCOPY WITH PROPOFOL;  Surgeon: Sherrilyn Rist, MD;  Location: [facility] ENDOSCOPY;  Service: Gastroenterology;  Laterality: N/A;   ESOPHAGOGASTRODUODENOSCOPY (EGD) WITH PROPOFOL N/A 11/27/2019    Procedure: ESOPHAGOGASTRODUODENOSCOPY (EGD) WITH PROPOFOL;  Surgeon: Sherrilyn Rist, MD;  Location: [facility] ENDOSCOPY;  Service: Gastroenterology;  Laterality: N/A;   HEMOSTASIS CLIP PLACEMENT   11/27/2019     Procedure: HEMOSTASIS CLIP PLACEMENT;  Surgeon: Sherrilyn Rist, MD;  Location: [facility] ENDOSCOPY;  Service: Gastroenterology;;   HERNIA REPAIR   1998   LEFT HEART CATH AND CORONARY ANGIOGRAPHY N/A 06/04/2016    Procedure: Left Heart Cath and Coronary Angiography;  Surgeon: Lyn Records, MD;  Location: [facility] INVASIVE CV LAB;  Service: Cardiovascular;  Laterality: N/A;      Current Medications: Active Medications      Current Meds  Medication Sig   glipiZIDE (GLUCOTROL) 10 MG tablet Take 10 mg by mouth 2 (two) times daily before a meal.    IRON, FERROUS SULFATE, PO Take 65 mg by mouth daily. Takes SV iron 65mg  by prescription.   metFORMIN (GLUCOPHAGE) 1000 MG tablet Take 1,000 mg by mouth 2 (two) times daily with a meal.   metoprolol (LOPRESSOR) 50 MG tablet Take 1 tablet (50 mg total) by mouth 2 (two) times daily.   nitroGLYCERIN (NITROSTAT) 0.4 MG SL tablet Place 1 tablet (0.4 mg total) under the tongue every 5 (five) minutes as needed for chest pain.   pantoprazole (PROTONIX) 40 MG tablet Take 1 tablet (40 mg total) by mouth daily.   ranolazine (RANEXA) 500 MG 12 hr tablet Take 500 mg by mouth 2 (two) times daily.   valsartan (DIOVAN) 40 MG tablet Take 1 tablet (40 mg total) by mouth daily.   [DISCONTINUED] lisinopril (PRINIVIL,ZESTRIL) 20 MG tablet Take 1 tablet (20 mg total) by mouth 2 (two) times daily.        Allergies:   Patient has no known allergies.    Social History         Socioeconomic History   Marital status: Legally Separated      Spouse name: Not on file   Number of children: Not on file   Years of education: Not on file   Highest education level: Not on file  Occupational History   Not on file  Tobacco Use   Smoking status: Every Day      Packs/day: 1.00      Years: 40.00      Pack years: 40.00      Types: Cigarettes   Smokeless tobacco: Current  Types: Chew   Tobacco comments:      half pack daily-05/05/2020-AH  Vaping Use   Vaping Use: Never used   Substance and Sexual Activity   Alcohol use: No   Drug use: Yes      Types: Marijuana   Sexual activity: Yes  Other Topics Concern   Not on file  Social History Narrative   Not on file    Social Determinants of Health    Financial Resource Strain: Not on file  Food Insecurity: Not on file  Transportation Needs: Not on file  Physical Activity: Not on file  Stress: Not on file  Social Connections: Not on file      Family History: The patient's family history includes CAD in his father; Diabetes in his father; Heart failure in his mother; Hypertension in his mother; Prostate cancer in his brother.   ROS:   Review of Systems  Constitution: Negative for decreased appetite, fever and weight gain.  HENT: Negative for congestion, ear discharge, hoarse voice and sore throat.   Eyes: Negative for discharge, redness, vision loss in right eye and visual halos.  Cardiovascular: Negative for chest pain, dyspnea on exertion, leg swelling, orthopnea and palpitations.  Respiratory: Negative for cough, hemoptysis, shortness of breath and snoring.   Endocrine: Negative for heat intolerance and polyphagia.  Hematologic/Lymphatic: Negative for bleeding problem. Does not bruise/bleed easily.  Skin: Negative for flushing, nail changes, rash and suspicious lesions.  Musculoskeletal: Negative for arthritis, joint pain, muscle cramps, myalgias, neck pain and stiffness.  Gastrointestinal: Negative for abdominal pain, bowel incontinence, diarrhea and excessive appetite.  Genitourinary: Negative for decreased libido, genital sores and incomplete emptying.  Neurological: Negative for brief paralysis, focal weakness, headaches and loss of balance.  Psychiatric/Behavioral: Negative for altered mental status, depression and suicidal ideas.  Allergic/Immunologic: Negative for HIV exposure and persistent infections.      EKGs/Labs/Other Studies Reviewed:     The following studies were reviewed today:      EKG: None today   Transthoracic echocardiogram done Jun 07, 2020 IMPRESSIONS   1. Left ventricular ejection fraction, by estimation, is 55 to 60%. The  left ventricle has normal function. The left ventricle has no regional  wall motion abnormalities. There is severe concentric left ventricular  hypertrophy. Left ventricular diastolic   parameters are consistent with Grade III diastolic dysfunction  (restrictive).   2. Right ventricular systolic function is normal. The right ventricular  size is normal. There is normal pulmonary artery systolic pressure.   3. Left atrial size was mildly dilated.   4. The mitral valve is normal in structure. No evidence of mitral valve  regurgitation. No evidence of mitral stenosis.   5. There is a small (1.81 x 1.46 cm) mass on the posterior leaflet of the  tricuspid valve, best seen in the subcostal view (clip 64), which appears  to be present on previous echo done in 10/2019 (clip 68). May need further  cardiac imaging study (cardiac  MRI) for better visualization.   6. The aortic valve is calcified. There is moderate calcification of the  aortic valve. Aortic valve regurgitation is mild to moderate. Moderate to  severe aortic valve stenosis.   7. 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   8. The inferior vena cava is normal in size with greater than 50%  respiratory variability, suggesting right atrial pressure of 3 mmHg.   Comparison(s): A prior study was performed on 10/2019. No significant  change from prior study.   FINDINGS  Left Ventricle: Left ventricular ejection fraction, by estimation, is 55  to 60%. The left ventricle has normal function. The left ventricle has no  regional wall motion abnormalities. Global longitudinal strain performed  but not reported based on  interpreter judgement due to suboptimal tracking. The left ventricular  internal cavity size was normal in size. There is severe concentric left  ventricular hypertrophy. Left  ventricular diastolic parameters are  consistent with Grade III diastolic  dysfunction (restrictive).   Right Ventricle: The right ventricular size is normal. No increase in  right ventricular wall thickness. Right ventricular systolic function is  normal. There is normal pulmonary artery systolic pressure. The tricuspid  regurgitant velocity is 2.10 m/s, and   with an assumed right atrial pressure of 3 mmHg, the estimated right  ventricular systolic pressure is 20.6 mmHg.   Left Atrium: Left atrial size was mildly dilated.   Right Atrium: Right atrial size was normal in size.   Pericardium: There is no evidence of pericardial effusion.   Mitral Valve: The mitral valve is normal in structure. Mild mitral annular  calcification. No evidence of mitral valve regurgitation. No evidence of  mitral valve stenosis.   Tricuspid Valve: There is a small (1.81 x 1.46 cm) mass on the posterior  leaflet of the tricuspid valve, best seen in the subcostal view (clip 64),  which appears to be present on previous echo done in 10/2019 (clip 68). May  need further cardiac imaging  study (cardiac MRI) for better visualization. The tricuspid valve is  normal in structure. Tricuspid valve regurgitation is trivial. No evidence  of tricuspid stenosis.   Aortic Valve: The aortic valve is calcified. There is moderate  calcification of the aortic valve. There is mild aortic valve annular  calcification. Aortic valve regurgitation is mild to moderate. Aortic  regurgitation PHT measures 609 msec. Moderate to  severe aortic stenosis is present. Aortic valve mean gradient measures  43.0 mmHg. Aortic valve peak gradient measures 70.6 mmHg. Aortic valve  area, by VTI measures 1.25 cm.   Pulmonic Valve: The pulmonic valve was normal in structure. Pulmonic valve  regurgitation is not visualized. No evidence of pulmonic stenosis.   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 The aortic root is normal in size  and  structure.   Venous: The inferior vena cava is normal in size with greater than 50%  respiratory variability, suggesting right atrial pressure of 3 mmHg.   IAS/Shunts: No atrial level shunt detected by color flow Doppler.    Recent Labs: 06/10/2020: ALT 12; B Natriuretic Peptide 53.0 07/06/2020: TSH 1.171 08/18/2020: BUN 21; Creatinine, Ser 0.98; Hemoglobin 10.9; Magnesium 1.4; Platelets 213; Potassium 4.5; Sodium 140  Recent Lipid Panel Labs (Brief)          Component Value Date/Time    CHOL 82 11/27/2019 1004    CHOL 172 12/10/2018 1058    TRIG 60 11/27/2019 1004    HDL 37 (L) 11/27/2019 1004    HDL 43 12/10/2018 1058    CHOLHDL 2.2 11/27/2019 1004    VLDL 12 11/27/2019 1004    LDLCALC 33 11/27/2019 1004    LDLCALC 100 (H) 12/10/2018 1058        Physical Exam:     VS:  BP (!) 142/78   Pulse 74   Ht  (1.778 m)   Wt 200 lb 12.8 oz (91.1 kg)   SpO2 98%   BMI 28.81 kg/m         Wt Readings from  Last 3 Encounters:  08/01/20 200 lb 12.8 oz (91.1 kg)  07/19/20 199 lb 9 oz (90.5 kg)  07/12/20 206 lb (93.4 kg)      GEN: Well nourished, well developed in no acute distress HEENT: Normal NECK: No JVD; No carotid bruits LYMPHATICS: No lymphadenopathy CARDIAC: S1S2 noted,RRR, no murmurs, rubs, gallops RESPIRATORY:  Clear to auscultation without rales, wheezing or rhonchi  ABDOMEN: Soft, non-tender, non-distended, +bowel sounds, no guarding. EXTREMITIES: No edema, No cyanosis, no clubbing MUSCULOSKELETAL:  No deformity  SKIN: Warm and dry NEUROLOGIC:  Alert and oriented x 3, non-focal PSYCHIATRIC:  Normal affect, good insight   ASSESSMENT:     1. Coronary artery disease involving native coronary artery of native heart without angina pectoris   2. Diabetes mellitus due to underlying condition with hyperosmolarity without coma, without long-term current use of insulin (HCC)   3. Hyperlipidemia LDL goal <70   4. Tobacco abuse   5. Encounter to establish care with new  doctor   6. Nonrheumatic aortic valve stenosis   7. Nonrheumatic aortic valve insufficiency     PLAN:       Since starting his iron therapy he has been doing well shortness of breath has improved significantly.  He is very happy.  He has been looking for a new primary care provider we will refer the patient to Dr. Sedalia Muta PCP office here in [place].   He is still experiencing chest pain - after reassess his clinical picture and reviewing his recent echo - despite AVA 1.25 cmsq suspect this may be symptomatic aortic stenosis - Mean gradient 43 mmHg, peak velocity 4.2 m/s. Therefore in light if this and concern for The finding on the his tricuspid valve it will be best to get a TEE. Will cancel the cardiac MR. Also will refer the patient to CT surgery.    This is being managed by his primary care doctor.  No adjustments for antidiabetic medications were made today.  His blood pressure has elevated in the office we will elected to stop the lisinopril and started patient on valsartan 40 mg daily. Hyperlipidemia - continue with current statin medication.     The patient is in agreement with the above plan. The patient left the office in stable condition.  The patient will follow up in 6 months-he plans to follow up with me in [place].     Medication Adjustments/Labs and Tests Ordered: Current medicines are reviewed at length with the patient today.  Concerns regarding medicines are outlined above.     Orders Placed This Encounter  Procedures   Ambulatory referral to [facility]          Meds ordered this encounter  Medications   valsartan (DIOVAN) 40 MG tablet      Sig: Take 1 tablet (40 mg total) by mouth daily.      Dispense:  90 tablet      Refill:  3      Patient Instructions  Medication Instructions:    Your physician has recommended you make the following change in your medication:  STOP: Lisinopril  START: Valsartan 40 mg once daily   *If you need a refill on your  cardiac medications before your next appointment, please call your pharmacy*     Lab Work: None If you have labs (blood work) drawn today and your tests are completely normal, you will receive your results only by: MyChart Message (if you have MyChart) OR A paper copy in the mail  If you have any lab test that is abnormal or we need to change your treatment, we will call you to review the results.     Testing/Procedures: None     Follow-Up: At [facility], you and your health needs are our priority.  As part of our continuing mission to provide you with exceptional heart care, we have created designated Provider Care Teams.  These Care Teams include your primary Cardiologist (physician) and Advanced Practice Providers (APPs -  Physician Assistants and Nurse Practitioners) who all work together to provide you with the care you need, when you need it.   We recommend signing up for the patient portal called "MyChart".  Sign up information is provided on this After Visit Summary.  MyChart is used to connect with patients for Virtual Visits (Telemedicine).  Patients are able to view lab/test results, encounter notes, upcoming appointments, etc.  Non-urgent messages can be sent to your provider as well.   To learn more about what you can do with MyChart, go to [email or web address].     Your next appointment:   6 month(s)   The format for your next appointment:   In Person   Provider:   [address]  - Thomasene RippleKardie Tobb, DO     Other Instructions     Adopting a Healthy Lifestyle.   Know what a healthy weight is for you (roughly BMI <25) and aim to maintain this   Aim for 7+ servings of fruits and vegetables daily   65-80+ fluid ounces of water or unsweet tea for healthy kidneys   Limit to max 1 drink of alcohol per day; avoid smoking/tobacco   Limit animal fats in diet for cholesterol and heart health - choose grass fed whenever available   Avoid highly processed foods, and  foods high in saturated/trans fats   Aim for low stress - take time to unwind and care for your mental health   Aim for 150 min of moderate intensity exercise weekly for heart health, and weights twice weekly for bone health   Aim for 7-9 hours of sleep daily   When it comes to diets, agreement about the perfect plan isnt easy to find, even among the experts. Experts at the [employer] developed an idea known as the Healthy Eating Plate. Just imagine a plate divided into logical, healthy portions.   The emphasis is on diet quality:   Load up on vegetables and fruits - one-half of your plate: Aim for color and variety, and remember that potatoes dont count.   Go for whole grains - one-quarter of your plate: Whole wheat, barley, wheat berries, quinoa, oats, brown rice, and foods made with them. If you want pasta, go with whole wheat pasta.   Protein power - one-quarter of your plate: Fish, chicken, beans, and nuts are all healthy, versatile protein sources. Limit red meat.   The diet, however, does go beyond the plate, offering a few other suggestions.   Use healthy plant oils, such as olive, canola, soy, corn, sunflower and peanut. Check the labels, and avoid partially hydrogenated oil, which have unhealthy trans fats.   If youre thirsty, drink water. Coffee and tea are good in moderation, but skip sugary drinks and limit milk and dairy products to one or two daily servings.   The type of carbohydrate in the diet is more important than the amount. Some sources of carbohydrates, such as vegetables, fruits, whole grains, and beans-are healthier than others.  Finally, stay active   Osvaldo Shipper, DO  08/24/2020 12:10 PM    [facility]           Note Details  Author Thomasene Ripple, DO File Time 08/24/2020 12:10 PM  Author Type Physician Status Signed  Last Editor Thomasene Ripple, [facility] Specialty Cardiology  Hospital Acct # [id number]  Admit Date 09/12/2020

## 2020-10-11 NOTE — Progress Notes (Addendum)
Surgical Instructions    Your procedure is scheduled on 10/14/20.  Report to [facility] Main Entrance "A" at 5:30 A.M., then check in with the Admitting office.  Call this number if you have problems the morning of surgery:  [phone]   If you have any questions prior to your surgery date call [phone]: Open Monday-Friday 8am-4pm    Remember:  Do not eat  or drink after midnight the night before your surgery   Take these medicines the morning of surgery with A SIP OF WATER : metoprolol (LOPRESSOR) Renexa  If Needed: nitroGLYCERIN (NITROSTAT)   As of today, STOP taking any Aspirin (unless otherwise instructed by your surgeon) Aleve, Naproxen, Ibuprofen, Motrin, Advil, Goody's, BC's, all herbal medications, fish oil, and all vitamins.  WHAT DO I DO ABOUT MY DIABETES MEDICATION?   Do not take oral diabetes medicines (pills) the morning of surgery. No Metformin day of surgery.   DO NOT take evening dose of Glipizide night before surgery or morning of surgery.   HOW TO MANAGE YOUR DIABETES BEFORE AND AFTER SURGERY  Why is it important to control my blood sugar before and after surgery? Improving blood sugar levels before and after surgery helps healing and can limit problems. A way of improving blood sugar control is eating a healthy diet by:  Eating less sugar and carbohydrates  Increasing activity/exercise  Talking with your doctor about reaching your blood sugar goals High blood sugars (greater than 180 mg/dL) can raise your risk of infections and slow your recovery, so you will need to focus on controlling your diabetes during the weeks before surgery. Make sure that the doctor who takes care of your diabetes knows about your planned surgery including the date and location.  How do I manage my blood sugar before surgery? Check your blood sugar at least 4 times a day, starting 2 days before surgery, to make sure that the level is not too high or low.  Check your  blood sugar the morning of your surgery when you wake up and every 2 hours until you get to the Short Stay unit.  If your blood sugar is less than 70 mg/dL, you will need to treat for low blood sugar: Do not take insulin. Treat a low blood sugar (less than 70 mg/dL) with  cup of clear juice (cranberry or apple), 4 glucose tablets, OR glucose gel. Recheck blood sugar in 15 minutes after treatment (to make sure it is greater than 70 mg/dL). If your blood sugar is not greater than 70 mg/dL on recheck, call [phone] for further instructions. Report your blood sugar to the short stay nurse when you get to Short Stay.  If you are admitted to the hospital after surgery: Your blood sugar will be checked by the staff and you will probably be given insulin after surgery (instead of oral diabetes medicines) to make sure you have good blood sugar levels. The goal for blood sugar control after surgery is 80-180 mg/dL.           Do not wear jewelry  Do not wear lotions, powders, colognes, or deodorant. Do not shave 48 hours prior to surgery.  Men may shave face and neck. Do not bring valuables to the hospital.              [facility] is not responsible for any belongings or valuables.  Do NOT Smoke (Tobacco/Vaping)  24 hours prior to your procedure If you use a CPAP at night, you  may bring all equipment for your overnight stay.   Contacts, glasses, dentures or bridgework may not be worn into surgery, please bring cases for these belongings   For patients admitted to the hospital, discharge time will be determined by your treatment team.   Patients discharged the day of surgery will not be allowed to drive home, and someone needs to stay with them for 24 hours.  ONLY 1 SUPPORT PERSON MAY BE PRESENT WHILE YOU ARE IN SURGERY. IF YOU ARE TO BE ADMITTED ONCE YOU ARE IN YOUR ROOM YOU WILL BE ALLOWED TWO (2) VISITORS.  Minor children may have two parents present. Special consideration for safety and  communication needs will be reviewed on a case by case basis.  Special instructions:    Oral Hygiene is also important to reduce your risk of infection.  Remember - BRUSH YOUR TEETH THE MORNING OF SURGERY WITH YOUR REGULAR TOOTHPASTE   [place]- Preparing For Surgery  Before surgery, you can play an important role. Because skin is not sterile, your skin needs to be as free of germs as possible. You can reduce the number of germs on your skin by washing with CHG (chlorahexidine gluconate) Soap before surgery.  CHG is an antiseptic cleaner which kills germs and bonds with the skin to continue killing germs even after washing.     Please do not use if you have an allergy to CHG or antibacterial soaps. If your skin becomes reddened/irritated stop using the CHG.  Do not shave (including legs and underarms) for at least 48 hours prior to first CHG shower. It is OK to shave your face.  Please follow these instructions carefully.     Shower the NIGHT BEFORE SURGERY and the MORNING OF SURGERY with CHG Soap.   If you chose to wash your hair, wash your hair first as usual with your normal shampoo. After you shampoo, rinse your hair and body thoroughly to remove the shampoo.  Then [employer] and genitals (private parts) with your normal soap and rinse thoroughly to remove soap.  After that Use CHG Soap as you would any other liquid soap. You can apply CHG directly to the skin and wash gently with a scrungie or a clean washcloth.   Apply the CHG Soap to your body ONLY FROM THE NECK DOWN.  Do not use on open wounds or open sores. Avoid contact with your eyes, ears, mouth and genitals (private parts). Wash Face and genitals (private parts)  with your normal soap.   Wash thoroughly, paying special attention to the area where your surgery will be performed.  Thoroughly rinse your body with warm water from the neck down.  DO NOT shower/wash with your normal soap after using and rinsing off the CHG  Soap.  Pat yourself dry with a CLEAN TOWEL.  Wear CLEAN PAJAMAS to bed the night before surgery  Place CLEAN SHEETS on your bed the night before your surgery  DO NOT SLEEP WITH PETS.   Day of Surgery:  Take a shower with CHG soap. Wear Clean/Comfortable clothing the morning of surgery Do not apply any deodorants/lotions.   Remember to brush your teeth WITH YOUR REGULAR TOOTHPASTE.   Please read over the following fact sheets that you were given.

## 2020-10-12 ENCOUNTER — Other Ambulatory Visit: Payer: Self-pay

## 2020-10-12 ENCOUNTER — Encounter (HOSPITAL_COMMUNITY)
Admission: RE | Admit: 2020-10-12 | Discharge: 2020-10-12 | Disposition: A | Discharge status: Home / Self Care | Payer: Medicaid Other | Source: Ambulatory Visit | Attending: Surgery | Admitting: Surgery

## 2020-10-12 ENCOUNTER — Ambulatory Visit (HOSPITAL_COMMUNITY)
Admission: RE | Admit: 2020-10-12 | Discharge: 2020-10-12 | Disposition: A | Discharge status: Home / Self Care | Payer: Medicaid Other | Source: Ambulatory Visit | Attending: Surgery | Admitting: Surgery

## 2020-10-12 ENCOUNTER — Encounter (HOSPITAL_COMMUNITY): Payer: Self-pay

## 2020-10-12 DIAGNOSIS — I350 Nonrheumatic aortic (valve) stenosis: Secondary | ICD-10-CM

## 2020-10-12 DIAGNOSIS — I2510 Atherosclerotic heart disease of native coronary artery without angina pectoris: Secondary | ICD-10-CM

## 2020-10-12 DIAGNOSIS — Z20822 Contact with and (suspected) exposure to covid-19: Secondary | ICD-10-CM | POA: Insufficient documentation

## 2020-10-12 DIAGNOSIS — Z01818 Encounter for other preprocedural examination: Secondary | ICD-10-CM | POA: Diagnosis not present

## 2020-10-12 DIAGNOSIS — E785 Hyperlipidemia, unspecified: Secondary | ICD-10-CM | POA: Diagnosis not present

## 2020-10-12 DIAGNOSIS — F17200 Nicotine dependence, unspecified, uncomplicated: Secondary | ICD-10-CM | POA: Insufficient documentation

## 2020-10-12 DIAGNOSIS — E119 Type 2 diabetes mellitus without complications: Secondary | ICD-10-CM | POA: Insufficient documentation

## 2020-10-12 DIAGNOSIS — I10 Essential (primary) hypertension: Secondary | ICD-10-CM | POA: Diagnosis not present

## 2020-10-12 DIAGNOSIS — I351 Nonrheumatic aortic (valve) insufficiency: Secondary | ICD-10-CM

## 2020-10-12 DIAGNOSIS — Z955 Presence of coronary angioplasty implant and graft: Secondary | ICD-10-CM | POA: Diagnosis not present

## 2020-10-12 HISTORY — DX: Cerebral infarction, unspecified: I63.9

## 2020-10-12 LAB — CBC
HCT: 40.5 % (ref 39.0–52.0)
Hemoglobin: 12.6 g/dL — ABNORMAL LOW (ref 13.0–17.0)
MCH: 25.9 pg — ABNORMAL LOW (ref 26.0–34.0)
MCHC: 31.1 g/dL (ref 30.0–36.0)
MCV: 83.2 fL (ref 80.0–100.0)
Platelets: 231 10*3/uL (ref 150–400)
RBC: 4.87 MIL/uL (ref 4.22–5.81)
RDW: 20.8 % — ABNORMAL HIGH (ref 11.5–15.5)
WBC: 10.3 10*3/uL (ref 4.0–10.5)
nRBC: 0 % (ref 0.0–0.2)

## 2020-10-12 LAB — URINALYSIS, ROUTINE W REFLEX MICROSCOPIC
Bilirubin Urine: NEGATIVE
Glucose, UA: 150 mg/dL — AB
Hgb urine dipstick: NEGATIVE
Ketones, ur: NEGATIVE mg/dL
Leukocytes,Ua: NEGATIVE
Nitrite: NEGATIVE
Protein, ur: NEGATIVE mg/dL
Specific Gravity, Urine: 1.031 — ABNORMAL HIGH (ref 1.005–1.030)
pH: 5 (ref 5.0–8.0)

## 2020-10-12 LAB — COMPREHENSIVE METABOLIC PANEL
ALT: 11 U/L (ref 0–44)
AST: 17 U/L (ref 15–41)
Albumin: 3.6 g/dL (ref 3.5–5.0)
Alkaline Phosphatase: 59 U/L (ref 38–126)
Anion gap: 9 (ref 5–15)
BUN: 20 mg/dL (ref 6–20)
CO2: 20 mmol/L — ABNORMAL LOW (ref 22–32)
Calcium: 9.2 mg/dL (ref 8.9–10.3)
Chloride: 111 mmol/L (ref 98–111)
Creatinine, Ser: 1.06 mg/dL (ref 0.61–1.24)
GFR, Estimated: >60 mL/min (ref >60)
Glucose, Bld: 132 mg/dL — ABNORMAL HIGH (ref 70–99)
Potassium: 4.5 mmol/L (ref 3.5–5.1)
Sodium: 140 mmol/L (ref 135–145)
Total Bilirubin: 0.4 mg/dL (ref 0.3–1.2)
Total Protein: 6.5 g/dL (ref 6.5–8.1)

## 2020-10-12 LAB — APTT: aPTT: 26 seconds (ref 24–36)

## 2020-10-12 LAB — PROTIME-INR
INR: 0.9 (ref 0.8–1.2)
Prothrombin Time: 12.2 seconds (ref 11.4–15.2)

## 2020-10-12 LAB — BLOOD GAS, ARTERIAL
Acid-base deficit: 3.6 mmol/L — ABNORMAL HIGH (ref 0.0–2.0)
Bicarbonate: 21.1 mmol/L (ref 20.0–28.0)
Drawn by: 58793
FIO2: 21
O2 Saturation: 97.3 %
Patient temperature: 37
pCO2 arterial: 39.7 mmHg (ref 32.0–48.0)
pH, Arterial: 7.346 — ABNORMAL LOW (ref 7.350–7.450)
pO2, Arterial: 98.7 mmHg (ref 83.0–108.0)

## 2020-10-12 LAB — TYPE AND SCREEN
ABO/RH(D): B POS
Antibody Screen: NEGATIVE

## 2020-10-12 LAB — SURGICAL PCR SCREEN
MRSA, PCR: NEGATIVE
Staphylococcus aureus: NEGATIVE

## 2020-10-12 LAB — GLUCOSE, CAPILLARY: Glucose-Capillary: 231 mg/dL — ABNORMAL HIGH (ref 70–99)

## 2020-10-12 LAB — SARS CORONAVIRUS 2 (TAT 6-24 HRS): SARS Coronavirus 2: NEGATIVE

## 2020-10-12 NOTE — Progress Notes (Addendum)
PCP: Has used [facility] in [place].  Otherwise, no PCP. Cardiologist: Thomasene Ripple, Md  EKG: 10/12/20 CXR: 10/12/20 ECHO: 09/15/20 Stress Test: 05/29/16 Cardiac Cath: 09/12/20  Fasting Blood Sugar- does not check glucose.  Checks Blood Sugar__0_ times a day  OSA/CPAP: No  ASA: Will call office for instructions Blood Thinner: No  Covid test done in PAT 10/12/20  Anesthesia Review: Yes, cardiac history. Spoke with Fayrene Fearing, [place] okay for patient to take Ranexa DOS.   Patient denies shortness of breath, fever, cough, and chest pain at PAT appointment.  Patient verbalized understanding of instructions provided today at the PAT appointment.  Patient asked to review instructions at home and day of surgery.

## 2020-10-13 MED ORDER — POTASSIUM CHLORIDE 2 MEQ/ML IV SOLN
80.0000 meq | INTRAVENOUS | Status: DC
  Filled 2020-10-13: qty 40

## 2020-10-13 MED ORDER — PLASMA-LYTE A IV SOLN
INTRAVENOUS | Status: DC
  Filled 2020-10-13: qty 5

## 2020-10-13 MED ORDER — DEXMEDETOMIDINE HCL IN NACL 400 MCG/100ML IV SOLN
0.1000 ug/kg/h | INTRAVENOUS | Status: AC
  Administered 2020-10-14: .7 ug/kg/h via INTRAVENOUS
  Administered 2020-10-14: .4 ug/kg/h via INTRAVENOUS
  Filled 2020-10-13: qty 100

## 2020-10-13 MED ORDER — CEFAZOLIN SODIUM-DEXTROSE 2-4 GM/100ML-% IV SOLN
2.0000 g | INTRAVENOUS | Status: AC
  Administered 2020-10-14: 2 g via INTRAVENOUS
  Filled 2020-10-13: qty 100

## 2020-10-13 MED ORDER — NOREPINEPHRINE 4 MG/250ML-% IV SOLN
0.0000 ug/min | INTRAVENOUS | Status: DC
  Filled 2020-10-13: qty 250

## 2020-10-13 MED ORDER — INSULIN REGULAR(HUMAN) IN NACL 100-0.9 UT/100ML-% IV SOLN
INTRAVENOUS | Status: DC
  Filled 2020-10-13: qty 100

## 2020-10-13 MED ORDER — PHENYLEPHRINE HCL-NACL 20-0.9 MG/250ML-% IV SOLN
30.0000 ug/min | INTRAVENOUS | Status: AC
  Administered 2020-10-14: 10 ug/min via INTRAVENOUS
  Filled 2020-10-13: qty 250

## 2020-10-13 MED ORDER — SODIUM CHLORIDE 0.9 % IV SOLN
INTRAVENOUS | Status: DC
  Filled 2020-10-13: qty 30

## 2020-10-13 MED ORDER — EPINEPHRINE HCL 5 MG/250ML IV SOLN IN NS
0.0000 ug/min | INTRAVENOUS | Status: DC
  Filled 2020-10-13: qty 250

## 2020-10-13 MED ORDER — TRANEXAMIC ACID (OHS) PUMP PRIME SOLUTION
2.0000 mg/kg | INTRAVENOUS | Status: DC
  Filled 2020-10-13: qty 1.77

## 2020-10-13 MED ORDER — MANNITOL 20 % IV SOLN
INTRAVENOUS | Status: DC
  Filled 2020-10-13: qty 13

## 2020-10-13 MED ORDER — TRANEXAMIC ACID (OHS) BOLUS VIA INFUSION
15.0000 mg/kg | INTRAVENOUS | Status: AC
  Administered 2020-10-14: 1330.5 mg via INTRAVENOUS
  Filled 2020-10-13: qty 1331

## 2020-10-13 MED ORDER — TRANEXAMIC ACID 1000 MG/10ML IV SOLN
1.5000 mg/kg/h | INTRAVENOUS | Status: AC
  Administered 2020-10-14: 1.5 mg/kg/h via INTRAVENOUS
  Filled 2020-10-13: qty 25

## 2020-10-13 MED ORDER — MILRINONE LACTATE IN DEXTROSE 20-5 MG/100ML-% IV SOLN
0.3000 ug/kg/min | INTRAVENOUS | Status: DC
  Filled 2020-10-13: qty 100

## 2020-10-13 MED ORDER — NITROGLYCERIN IN D5W 200-5 MCG/ML-% IV SOLN
2.0000 ug/min | INTRAVENOUS | Status: DC
  Filled 2020-10-13: qty 250

## 2020-10-13 MED ORDER — VANCOMYCIN HCL 1500 MG/300ML IV SOLN
1500.0000 mg | INTRAVENOUS | Status: AC
  Administered 2020-10-14: 1500 mg via INTRAVENOUS
  Filled 2020-10-13: qty 300

## 2020-10-13 NOTE — H&P (Signed)
[address]       [place] [address]             [phone]      Cardiothoracic Surgery Admission History and Physical  PCP is Harvie Junior, MD Referring Provider is Berniece Salines, DO Primary Cardiologist is Berniece Salines, DO   Reason for consultation:  Severe aortic stenosis and moderate aortic insufficiency   HPI:   The patient is a 60 year old gentleman with history of diabetes, hypertension, hyperlipidemia, ongoing 1 pack/day smoking, severe open angle glaucoma, and coronary artery disease status post MI in 2010 treated with PCI and a stent and moderate aortic stenosis.  His last cardiac catheterization was in April 2018 which showed total occlusion of a nondominant right coronary artery that was previously stented.  There was 30% distal left main stenosis.  There is 40% proximal diffuse LAD narrowing and 60% ostial to proximal ramus stenosis.  The aortic valve peak to peak gradient at that time was 18 mmHg.  Left ventricular systolic function was normal.  He was treated medically.  He had a 2D echo on 10/14/2019 which was felt to show moderate aortic stenosis with a mean gradient of 38 mmHg and peak gradient of 71 mmHg.  There was moderate aortic insufficiency with a pressure half-time of 436 ms.  Left ventricular ejection fraction was 60 to 65% with moderate LVH.  He now presents with at least a several month history of exertional fatigue and shortness of breath.  He has had occasional episodes of chest tightness.  He reports positional dizziness when standing up.  He has had some ankle and pedal edema.  He denies PND and orthopnea.  He had a repeat 2D echo on 06/07/2020 showing calcified aortic valve with thickened leaflets with restricted mobility.  The mean gradient across aortic valve was 43 mmHg with a peak gradient of 71 mmHg.  Pressure half-time was 609 ms consistent with mild to moderate regurgitation.  There was a question of a 1.8 x 1.5 cm mass on the  posterior leaflet tricuspid valve.  The mitral valve was unremarkable.  Left ventricular ejection fraction was 55 to 60% with severe concentric LVH and grade 3 diastolic dysfunction.  He subsequently underwent TEE to better examine the tricuspid valve on 08/30/2020.  This showed an ejection fraction of 60 to 65%.  There was no mass seen on the tricuspid valve.  Aortic valve was calcified with what was felt to be moderate to severe aortic stenosis and mild aortic insufficiency.   He has a history of GI bleeding in the past with anemia.  He underwent EGD and colonoscopy with colonoscopy being negative and EGD showing some nonbleeding gastric ulcers.  He is currently seeing Dr. Alvy Bimler for treatment of recurrent severe iron deficiency anemia.  He was apparently in the emergency room at [facility] in June 2022 due to some chest discomfort.  His troponin was negative.  His pain was felt to be musculoskeletal.  The patient also tells me that he recently had a tooth extracted and was told by his dentist that he had a few teeth and cavities that need to be treated.           Past Medical History:  Diagnosis Date   Age-related nuclear cataract of both eyes 11/27/2018   Anemia     Bilateral recurrent inguinal hernia without obstruction or gangrene 05/09/2016    Added automatically from request for surgery [id number]  Chest pressure 11/06/2019   Coronary artery disease involving native artery of transplanted heart without angina pectoris     Cortical age-related cataract of both eyes 11/27/2018   Depression     Diabetes mellitus ([place])     Hyperlipidemia LDL goal <70     Hypertension     Inguinal hernia without obstruction or gangrene      BILATERAL   Primary open angle glaucoma (POAG) of both eyes, severe stage 11/27/2018   Primary open angle glaucoma (POAG) of left eye, severe stage 11/27/2018   Tobacco abuse 11/05/2019   Tobacco use 11/06/2019           Past Surgical History:  Procedure Laterality Date    BIOPSY   11/27/2019    Procedure: BIOPSY;  Surgeon: Doran Stabler, MD;  Location: [place];  Service: Gastroenterology;;   COLONOSCOPY WITH PROPOFOL N/A 11/27/2019    Procedure: COLONOSCOPY WITH PROPOFOL;  Surgeon: Doran Stabler, MD;  Location: [facility];  Service: Gastroenterology;  Laterality: N/A;   ESOPHAGOGASTRODUODENOSCOPY (EGD) WITH PROPOFOL N/A 11/27/2019    Procedure: ESOPHAGOGASTRODUODENOSCOPY (EGD) WITH PROPOFOL;  Surgeon: Doran Stabler, MD;  Location: [facility];  Service: Gastroenterology;  Laterality: N/A;   HEMOSTASIS CLIP PLACEMENT   11/27/2019    Procedure: HEMOSTASIS CLIP PLACEMENT;  Surgeon: Doran Stabler, MD;  Location: [facility];  Service: Gastroenterology;;   HERNIA REPAIR   1998   LEFT HEART CATH AND CORONARY ANGIOGRAPHY N/A 06/04/2016    Procedure: Left Heart Cath and Coronary Angiography;  Surgeon: Belva Crome, MD;  Location: [facility];  Service: Cardiovascular;  Laterality: N/A;   TEE WITHOUT CARDIOVERSION N/A 08/30/2020    Procedure: TRANSESOPHAGEAL ECHOCARDIOGRAM (TEE);  Surgeon: Acie Fredrickson, Wonda Cheng, MD;  Location: [facility];  Service: Cardiovascular;  Laterality: N/A;           Family History  Problem Relation Age of Onset   Heart failure Mother     Hypertension Mother     CAD Father     Diabetes Father     Prostate cancer Brother        Social History         Socioeconomic History   Marital status: Legally Separated      Spouse name: Not on file   Number of children: Not on file   Years of education: Not on file   Highest education level: Not on file  Occupational History   Not on file  Tobacco Use   Smoking status: Every Day      Packs/day: 1.00      Years: 40.00      Pack years: 40.00      Types: Cigarettes   Smokeless tobacco: Current      Types: Chew   Tobacco comments:      half pack daily-05/05/2020-AH  Vaping Use   Vaping Use: Never used  Substance and Sexual Activity   Alcohol use: No    Drug use: Yes      Types: Marijuana   Sexual activity: Yes  Other Topics Concern   Not on file  Social History Narrative   Not on file    Social Determinants of Health    Financial Resource Strain: Not on file  Food Insecurity: Not on file  Transportation Needs: Not on file  Physical Activity: Not on file  Stress: Not on file  Social Connections: Not on file  Intimate Partner Violence: Not on file  Prior to Admission medications   Medication Sig Start Date End Date Taking? Authorizing Provider  acetaminophen-codeine (TYLENOL #3) 300-30 MG tablet Take 1-2 tablets by mouth every 6 (six) hours as needed. 08/02/20   Yes [provider]  aspirin 325 MG tablet Take 325 mg by mouth every 6 (six) hours as needed (arthritis).     Yes [provider]  glipiZIDE (GLUCOTROL) 10 MG tablet Take 10 mg by mouth 2 (two) times daily before a meal.  11/19/18   Yes [provider]  magnesium oxide (MAG-OX) 400 (240 Mg) MG tablet Take 1 tablet (400 mg total) by mouth 2 (two) times daily. 08/19/20   Yes Tobb, Kardie, DO  metFORMIN (GLUCOPHAGE) 1000 MG tablet Take 1,000 mg by mouth 2 (two) times daily with a meal.     Yes [provider]  metoprolol (LOPRESSOR) 50 MG tablet Take 1 tablet (50 mg total) by mouth 2 (two) times daily. 05/11/16   Yes Eileen Stanford, PA-C  nitroGLYCERIN (NITROSTAT) 0.4 MG SL tablet Place 1 tablet (0.4 mg total) under the tongue every 5 (five) minutes as needed for chest pain. 06/16/20 09/14/20 Yes Tobb, Kardie, DO  pantoprazole (PROTONIX) 40 MG tablet Take 1 tablet (40 mg total) by mouth daily. 06/02/20   Yes Tobb, Kardie, DO  ranolazine (RANEXA) 500 MG 12 hr tablet Take 500 mg by mouth 2 (two) times daily.     Yes [provider]  valsartan (DIOVAN) 40 MG tablet Take 1 tablet (40 mg total) by mouth daily. 08/01/20   Yes Tobb, Kardie, DO            Current Outpatient Medications  Medication Sig Dispense Refill    acetaminophen-codeine (TYLENOL #3) 300-30 MG tablet Take 1-2 tablets by mouth every 6 (six) hours as needed.       aspirin 325 MG tablet Take 325 mg by mouth every 6 (six) hours as needed (arthritis).       glipiZIDE (GLUCOTROL) 10 MG tablet Take 10 mg by mouth 2 (two) times daily before a meal.        magnesium oxide (MAG-OX) 400 (240 Mg) MG tablet Take 1 tablet (400 mg total) by mouth 2 (two) times daily. 14 tablet 0   metFORMIN (GLUCOPHAGE) 1000 MG tablet Take 1,000 mg by mouth 2 (two) times daily with a meal.       metoprolol (LOPRESSOR) 50 MG tablet Take 1 tablet (50 mg total) by mouth 2 (two) times daily. 180 tablet 2   nitroGLYCERIN (NITROSTAT) 0.4 MG SL tablet Place 1 tablet (0.4 mg total) under the tongue every 5 (five) minutes as needed for chest pain. 90 tablet 3   pantoprazole (PROTONIX) 40 MG tablet Take 1 tablet (40 mg total) by mouth daily. 90 tablet 3   ranolazine (RANEXA) 500 MG 12 hr tablet Take 500 mg by mouth 2 (two) times daily.       valsartan (DIOVAN) 40 MG tablet Take 1 tablet (40 mg total) by mouth daily. 90 tablet 3    No current facility-administered medications for this visit.           Allergies  Allergen Reactions   Cetacaine [Butamben-Tetracaine-Benzocaine] Shortness Of Breath      Intense laryngospasm          Review of Systems:               General:                      +  loss of appetite, + decreased energy, no weight gain, no weight loss, no fever             Cardiac:                       + chest pain with exertion, no chest pain at rest, +SOB with mild exertion, no resting SOB, no PND, no orthopnea, no palpitations, no arrhythmia, no atrial fibrillation, + LE edema, + dizzy spells, no syncope             Respiratory:                 + exertional shortness of breath, no home oxygen, + productive cough, no dry cough, no bronchitis, + wheezing, no hemoptysis, no asthma, no pain with inspiration or cough, + sleep apnea, no CPAP at night             GI:                                no difficulty swallowing, no reflux, no frequent heartburn, no hiatal hernia, no abdominal pain, + constipation, no diarrhea, no hematochezia, no hematemesis, no melena             GU:                              no dysuria,  + frequency, no urinary tract infection, no hematuria, no enlarged prostate, no kidney stones, no kidney disease             Vascular:                     no pain suggestive of claudication, no pain in feet, + leg cramps, no varicose veins, no DVT, no non-healing foot ulcer             Neuro:                         + stroke, no TIA's, no seizures, + headaches, no temporary blindness one eye,  no slurred speech, no peripheral neuropathy, + chronic pain, no instability of gait, + memory/cognitive dysfunction             Musculoskeletal:         + arthritis - primarily involving the knees, no joint swelling, + myalgias, no difficulty walking, normalno mobility              Skin:                            no rash, no itching, no skin infections, no pressure sores or ulcerations             Psych:                         no anxiety, no depression, no nervousness, no unusual recent stress             Eyes:                           no blurry vision, no floaters, no recent vision changes, no glasses or contacts  ENT:                            no hearing loss, no loose or painful teeth, no dentures, last saw dentist last month             Hematologic:               no easy bruising, no abnormal bleeding, no clotting disorder, no frequent epistaxis             Endocrine:                   + diabetes, does check CBG's at home                            Physical Exam:               BP (!) 167/77 (BP Location: Right Arm, Patient Position: Sitting, Cuff Size: Normal)   Pulse 75   Resp 20   Ht 5' 10"  (1.778 m)   Wt 204 lb (92.5 kg)   SpO2 97% Comment: RA  BMI 29.27 kg/m              General:                      well-appearing              HEENT:                       Unremarkable, NCAT, PERLA, EOMI             Neck:                           no JVD, no bruits, transmitted murmur to both sides on neck, no adenopathy              Chest:                          clear to auscultation, symmetrical breath sounds, no wheezes, no rhonchi              CV:                              RRR, 3/6 systolic murmur RSB, soft S2 with 1/6 diastolic murmur LLSB             Abdomen:                    soft, non-tender, no masses              Extremities:                 warm, well-perfused, pulses palpable at ankle, no lower extremity edema             Rectal/GU                   Deferred             Neuro:                         Grossly non-focal and symmetrical throughout  Skin:                            Clean and dry, no rashes, no breakdown   Diagnostic Tests:   ECHOCARDIOGRAM REPORT         Patient Name:   TAMARA KENYON Date of Exam: 06/07/2020  Medical Rec #:  [id number]           Height:       70.0 in  Accession #:    [id number]          Weight:       205.4 lb  Date of Birth:  1961-01-18          BSA:          2.111 m  Patient Age:    72 years            BP:           112/60 mmHg  Patient Gender: M                   HR:           61 bpm.  Exam Location:  [facility]   Procedure: 2D Echo   Indications:    Shortness of breath [R06.02 (ICD-10-CM)]; Wheezing [R06.2                  (ICD-10-CM)]; Aortic valve stenosis, etiology of cardiac  valve                  disease unspecified [I35.0 (ICD-10-CM)]     History:        Patient has no prior history of Echocardiogram  examinations.                  CAD; Risk Factors:Hypertension, Diabetes, Dyslipidemia and                  Tobacco abuse.     Sonographer:    Luane School  Referring Phys: [id number] [place]     1. Left ventricular ejection fraction, by estimation, is 55 to 60%. The  left ventricle has normal function. The left ventricle has no  regional  wall motion abnormalities. There is severe concentric left ventricular  hypertrophy. Left ventricular diastolic   parameters are consistent with Grade III diastolic dysfunction  (restrictive).   2. Right ventricular systolic function is normal. The right ventricular  size is normal. There is normal pulmonary artery systolic pressure.   3. Left atrial size was mildly dilated.   4. The mitral valve is normal in structure. No evidence of mitral valve  regurgitation. No evidence of mitral stenosis.   5. There is a small (1.81 x 1.46 cm) mass on the posterior leaflet of the  tricuspid valve, best seen in the subcostal view (clip 64), which appears  to be present on previous echo done in 10/2019 (clip 68). May need further  cardiac imaging study (cardiac  MRI) for better visualization.   6. The aortic valve is calcified. There is moderate calcification of the  aortic valve. Aortic valve regurgitation is mild to moderate. Moderate to  severe aortic valve stenosis.   7. 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   8. The inferior vena cava is normal in size with greater than 50%  respiratory variability, suggesting right atrial pressure of 3 mmHg.   Comparison(s): A prior study was performed on 10/2019. No significant  change from prior study.   FINDINGS   Left Ventricle: Left ventricular ejection fraction, by estimation, is 55  to 60%. The left ventricle has normal function. The left ventricle has no  regional wall motion abnormalities. Global longitudinal strain performed  but not reported based on  interpreter judgement due to suboptimal tracking. The left ventricular  internal cavity size was normal in size. There is severe concentric left  ventricular hypertrophy. Left ventricular diastolic parameters are  consistent with Grade III diastolic  dysfunction (restrictive).   Right Ventricle: The right ventricular size is normal. No increase in  right ventricular wall thickness. Right  ventricular systolic function is  normal. There is normal pulmonary artery systolic pressure. The tricuspid  regurgitant velocity is 2.10 m/s, and   with an assumed right atrial pressure of 3 mmHg, the estimated right  ventricular systolic pressure is 50.0 mmHg.   Left Atrium: Left atrial size was mildly dilated.   Right Atrium: Right atrial size was normal in size.   Pericardium: There is no evidence of pericardial effusion.   Mitral Valve: The mitral valve is normal in structure. Mild mitral annular  calcification. No evidence of mitral valve regurgitation. No evidence of  mitral valve stenosis.   Tricuspid Valve: There is a small (1.81 x 1.46 cm) mass on the posterior  leaflet of the tricuspid valve, best seen in the subcostal view (clip 64),  which appears to be present on previous echo done in 10/2019 (clip 68). May  need further cardiac imaging  study (cardiac MRI) for better visualization. The tricuspid valve is  normal in structure. Tricuspid valve regurgitation is trivial. No evidence  of tricuspid stenosis.   Aortic Valve: The aortic valve is calcified. There is moderate  calcification of the aortic valve. There is mild aortic valve annular  calcification. Aortic valve regurgitation is mild to moderate. Aortic  regurgitation PHT measures 609 msec. Moderate to  severe aortic stenosis is present. Aortic valve mean gradient measures  43.0 mmHg. Aortic valve peak gradient measures 70.6 mmHg. Aortic valve  area, by VTI measures 1.25 cm.   Pulmonic Valve: The pulmonic valve was normal in structure. Pulmonic valve  regurgitation is not visualized. No evidence of pulmonic stenosis.   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 The aortic root is normal in size  and structure.   Venous: The inferior vena cava is normal in size with greater than 50%  respiratory variability, suggesting right atrial pressure of 3 mmHg.   IAS/Shunts: No atrial level shunt detected by color flow  Doppler.      LEFT VENTRICLE  PLAX 2D  LVIDd:         5.00 cm  Diastology  LVIDs:         2.40 cm  LV e' medial:    5.11 cm/s  LV PW:         1.90 cm  LV E/e' medial:  20.7  LV IVS:        1.90 cm  LV e' lateral:   7.51 cm/s  LVOT diam:     2.40 cm  LV E/e' lateral: 14.1  LV SV:         126  LV SV Index:   60  LVOT Area:     4.52 cm      RIGHT VENTRICLE             IVC  RV S prime:     13.30 cm/s  IVC diam: 1.40 cm  TAPSE (  M-mode): 3.0 cm   LEFT ATRIUM             Index       RIGHT ATRIUM           Index  LA diam:        3.70 cm 1.75 cm/m  RA Area:     14.50 cm  LA Vol (A2C):   99.9 ml 47.32 ml/m RA Volume:   32.60 ml  15.44 ml/m  LA Vol (A4C):   57.3 ml 27.14 ml/m  LA Biplane Vol: 78.6 ml 37.23 ml/m   AORTIC VALVE  AV Area (Vmax):    1.25 cm  AV Area (Vmean):   1.09 cm  AV Area (VTI):     1.25 cm  AV Vmax:           420.00 cm/s  AV Vmean:          306.000 cm/s  AV VTI:            1.010 m  AV Peak Grad:      70.6 mmHg  AV Mean Grad:      43.0 mmHg  LVOT Vmax:         116.00 cm/s  LVOT Vmean:        74.000 cm/s  LVOT VTI:          0.278 m  LVOT/AV VTI ratio: 0.28  AI PHT:            609 msec     AORTA  Ao Root diam: 3.50 cm  Ao Asc diam:  3.30 cm  Ao Desc diam: 2.25 cm   MITRAL VALVE                TRICUSPID VALVE  MV Area (PHT): 2.54 cm     TR Peak grad:   17.6 mmHg  MV Decel Time: 299 msec     TR Vmax:        210.00 cm/s  MV E velocity: 106.00 cm/s  MV A velocity: 125.00 cm/s  SHUNTS  MV E/A ratio:  0.85         Systemic VTI:  0.28 m                              Systemic Diam: 2.40 cm   Kardie Tobb DO  Electronically signed by Berniece Salines DO  Signature Date/Time: 06/07/2020/6:35:38 PM         Final       TRANSESOPHOGEAL ECHO REPORT         Patient Name:   DAVIS AMBROSINI Date of Exam: 08/30/2020  Medical Rec #:  [id number]           Height:       70.0 in  Accession #:    [id number]          Weight:       200.0 lb  Date of Birth:   25-Sep-1960          BSA:          2.087 m  Patient Age:    46 years            BP:           141/68 mmHg  Patient Gender: M                   HR:  72 bpm.  Exam Location:  Outpatient   Procedure: Transesophageal Echo and Color Doppler   Indications:     aortic stenosis     History:         Patient has prior history of Echocardiogram examinations,  most                   recent 06/07/2020. CAD; Risk Factors:Hypertension,  Dyslipidemia                   and Diabetes.     Sonographer:     Johny Chess  Referring Phys:  [place]  Diagnosing Phys: Mertie Moores MD   PROCEDURE: After discussion of the risks and benefits of a TEE, an  informed consent was obtained from the patient. The transesophogeal probe  was passed without difficulty through the esophogus of the patient. Local  oropharyngeal anesthetic was provided  with Cetacaine. Sedation performed by different physician. Patients was  under conscious sedation during this procedure. The patient developed no  complications during the procedure.   IMPRESSIONS     1. Left ventricular ejection fraction, by estimation, is 60 to 65%. The  left ventricle has normal function. There is moderate left ventricular  hypertrophy.   2. Right ventricular systolic function is normal. The right ventricular  size is normal.   3. No left atrial/left atrial appendage thrombus was detected.   4. The mitral valve is grossly normal. Mild to moderate mitral valve  regurgitation.   5. There was no mass seen on the Tricuspid valve.   6. The aortic valve is calcified. Aortic valve regurgitation is mild.  Moderate to severe aortic valve stenosis.   FINDINGS   Left Ventricle: Left ventricular ejection fraction, by estimation, is 60  to 65%. The left ventricle has normal function. The left ventricular  internal cavity size was normal in size. There is moderate left  ventricular hypertrophy.   Right Ventricle: The right  ventricular size is normal. Right vetricular  wall thickness was not well visualized. Right ventricular systolic  function is normal.   Left Atrium: Left atrial size was normal in size. No left atrial/left  atrial appendage thrombus was detected.   Right Atrium: Right atrial size was normal in size.   Pericardium: There is no evidence of pericardial effusion.   Mitral Valve: The mitral valve is grossly normal. Mild to moderate mitral  valve regurgitation.   Tricuspid Valve: There was no mass seen on the Tricuspid valve. The  tricuspid valve is normal in structure. Tricuspid valve regurgitation is  trivial. No evidence of tricuspid stenosis. There is no evidence of  tricuspid valve vegetation.   Aortic Valve: The aortic valve is calcified. Aortic valve regurgitation is  mild. Moderate to severe aortic stenosis is present.   Pulmonic Valve: The pulmonic valve was grossly normal. Pulmonic valve  regurgitation is not visualized.   Aorta: The aortic root and ascending aorta are structurally normal, with  no evidence of dilitation.   IAS/Shunts: The atrial septum is grossly normal.   Mertie Moores MD  Electronically signed by Mertie Moores MD  Signature Date/Time: 08/30/2020/4:16:21 PM         Final     Physicians  Panel Physicians Referring Physician Case Authorizing Physician  Leonie Man, MD (Primary)     Procedures  Intravascular Ultrasound/IVUS  RIGHT/LEFT HEART CATH AND CORONARY ANGIOGRAPHY   Conclusion      Ost LAD to Mid LAD lesion is  65% stenosed.   Mid LM to Ost LAD lesion is 35% stenosed with 70% stenosed side branch in Ramus.   1st LPL lesion is 99% stenosed.   Ost RCA to Prox RCA lesion is 100% stenosed.   1st Mrg lesion is 70% stenosed.   LV end diastolic pressure is normal.   There is moderate aortic valve stenosis.   SUMMARY Multivessel CAD:  Known CTO of nondominant RCA, and now CTO of LPL 1 along  70% ostial stenosis of Ramus Intermedius  and 1st Mrg Angiographically mild to moderate disease in the proximal LAD, however, by IVUS 2 tandem lesions of 65% stenosis. Angiographically concerning distal left main lesion that is new.  By IVUS appears to be a potential healed dissection. Normal Right Heart Cath Pressures: PA mean 18 mmHg with a Wedge pressure of 9 mm.  LVEDP of 11 mmHg. Moderate aortic stenosis by pressure measurement: Mean gradient 27 mmHg.   RECOMMENDATIONS With plans for open AVR, would consider the possibility of CABG -grafting LAD, RI as well as potentially branches of the dominant LCx. (Left Main lesion is not significant, but raises the specter of of unstable plaque in the Left Main and proximal LAD)     Glenetta Hew, MD     Recommendations  Antiplatelet/Anticoag Recommend Aspirin 28m daily for moderate CAD.  Discharge Date In the absence of any other complications or medical issues, we expect the patient to be ready for discharge from a cath perspective on 09/12/2020.   Surgeon Notes    08/30/2020  8:47 AM CV Procedure addendum by NAcie Fredrickson PWonda Cheng MD   Procedural Details  Technical Details Primary Care Provider: WHarvie Junior MD Referring Cardiologist: Dr. KBerniece SalinesCardiac Surgeon: Dr. BArvid Right Mr. MLezotteis a 60year old gentleman with history of PCI to nondominant RCA that was found to be occluded in 2010.  He is doing relatively well and then followed for progression of his aortic valve disease.  He was seen by Dr. THarriet Massonfor exertional dyspnea and chest pain.  He was evaluated with an echocardiogram showing progression of his aortic valve disease to the moderate to severe range.  He was subsequently referred to Dr. BCyndia Bent for preop assessment with plans for open AVR.  He now presents for right left heart cath for his preop evaluation.  Time Out: Verified patient identification, verified procedure, site/side was marked, verified correct patient position, special equipment/implants  available, medications/allergies/relevent history reviewed, required imaging and test results available. Performed.  Access:  RIGHT Radial Artery: 6 Fr sheath -- Seldinger technique using Micropuncture Kit Direct ultrasound guidance used.  Permanent image obtained and placed on chart. 10 mL radial cocktail IA; 4500 Units IV Heparin She does not eat * Right Brachial/Antecubital Vein: The existing 18-gauge IV was exchanged over a wire for a 5Fr  sheath  Right Heart Catheterization: 5 Fr SGordy Councilmancatheter advanced under fluoroscopy with balloon inflated to the RA, RV, then PCWP-PA for hemodynamic measurement.  0.25 J-wire used to help advance catheter. * Simultaneous FA & PA blood gases checked for SaO2% to calculate FICK CO/CI  * Catheter removed completely out of the body with balloon deflated.  Left Heart Catheterization: 5 and 6 Fr Catheters advanced or exchanged over a J-wire under direct fluoroscopic guidance into the ascending aorta; TIG 4.0 catheter advanced first.  * LV Hemodynamics (LV Gram): JR4 catheter * Left Coronary Artery Cineangiography: TIG 4.0 catheter, and 6 [place] 3.5 guide catheter * Right Coronary Artery Cineangiography:  JR4 catheter   Review of images revealed concern for distal left main lesion that has the appearance of possible dissection.  I chose to evaluate this with IVUS. -> 6 [place] JL 3.5 guide catheter used with Prowater wire and IVUS catheter. Additional boluses of IV heparin were administered to achieve ACT~250 sec  Upon completion of Angiogaphy, the catheter was removed completely out of the body over a wire, without complication.  Brachial Sheath(s) removed in the Cath Lab with manual pressure for hemostasis.    Radial sheath removed in the Cardiac Catheterization lab with TR Band placed for hemostasis.  TR Band: 1115  Hours; 13 mL air  MEDICATIONS * SQ Lidocaine 68m * Radial Cocktail: 3 mg Verapmil in 10 mL NS * Isovue Contrast: Total of 105  mL * Heparin: Total of 13,000 units Estimated blood loss <50 mL.   During this procedure medications were administered to achieve and maintain moderate conscious sedation while the patient's heart rate, blood pressure, and oxygen saturation were continuously monitored and I was present face-to-face 100% of this time.   Medications (Filter: Administrations occurring from 0940 to 1123 on 09/12/20)  important  Continuous medications are totaled by the amount administered until 09/12/20 1123.   Heparin (Porcine) in NaCl 1000-0.9 UT/500ML-% SOLN (mL) Total volume:  1,000 mL Date/Time Rate/Dose/Volume Action   09/12/20 0940 500 mL Given   0940 500 mL Given    midazolam (VERSED) injection (mg) Total dose:  1 mg Date/Time Rate/Dose/Volume Action   09/12/20 1009 1 mg Given    fentaNYL (SUBLIMAZE) injection (mcg) Total dose:  25 mcg Date/Time Rate/Dose/Volume Action   09/12/20 1009 25 mcg Given    lidocaine (PF) (XYLOCAINE) 1 % injection (mL) Total volume:  4 mL Date/Time Rate/Dose/Volume Action   09/12/20 1009 2 mL Given   1009 2 mL Given    Radial Cocktail/Verapamil only (mL) Total volume:  10 mL Date/Time Rate/Dose/Volume Action   09/12/20 1014 10 mL Given    heparin sodium (porcine) injection (Units) Total dose:  13,500 Units Date/Time Rate/Dose/Volume Action   09/12/20 1029 4,500 Units Given   1042 4,500 Units Given   1104 4,500 Units Given    iohexol (OMNIPAQUE) 350 MG/ML injection (mL) Total volume:  105 mL Date/Time Rate/Dose/Volume Action   09/12/20 1120 105 mL Given    Sedation Time  Sedation Time Physician-1: 1 hour 5 minutes 26 seconds Contrast  Medication Name Total Dose  iohexol (OMNIPAQUE) 350 MG/ML injection 105 mL   Radiation/Fluoro  Fluoro time: 14.5 (min) DAP: 27346 (mGycm2) Cumulative Air Kerma: 496 (mGy) Coronary Findings  Diagnostic Dominance: Left Left Main  Vessel is large.  Mid LM to Ost LAD lesion is 35% stenosed with 70% stenosed  side branch in Ramus. The lesion is located at the bifurcation and eccentric. The lesion is moderately calcified. It is not flow-limiting in diameter on IVUS, but is concerning angiographically Ultrasound (IVUS) was performed. IVUS has determined that the lesion is calcified and eccentric. There is a heavily calcified indentation seen on IVUS that angiographically in some images appears to be a likely healed dissection in the distal left main. The ostial Ramus Intermedius appears to have slight progression from previous heart catheterization.  Left Anterior Descending  Vessel is large. The vessel exhibits minimal luminal irregularities.  Ost LAD to Mid LAD lesion is 65% stenosed. The lesion is segmental, concentric and irregular. The lesion is moderately calcified. The area has roughly 45% stenosis with 2 focal areas of 65%. Ultrasound (  IVUS) was performed. Cross-sectional area: 5.5 mm. Moderate plaque burden was detected. IVUS has determined that the lesion is calcified and concentric. Average area of the LAD is greater than 18 mm.  First Diagonal Branch  Vessel is Moderate in Size  First Septal Branch  Vessel is small in size.  Second Septal Branch  Vessel is small in size.  Left Anterior Descending  Vessel is large. The vessel exhibits minimal luminal irregularities.  Ost LAD to Mid LAD lesion is 65% stenosed. The lesion is segmental, concentric and irregular. The lesion is moderately calcified. The area has roughly 45% stenosis with 2 focal areas of 65%. Ultrasound (IVUS) was performed. Cross-sectional area: 5.5 mm. Moderate plaque burden was detected. IVUS has determined that the lesion is calcified and concentric. Average area of the LAD is greater than 18 mm.  First Diagonal Branch  Vessel is Moderate in Size  First Septal Branch  Vessel is small in size.  Second Septal Branch  Vessel is small in size.  Ramus Intermedius  Vessel is normal in caliber and moderate in size. Vessel is  Large in size  Left Circumflex  Vessel is large. The vessel exhibits minimal luminal irregularities.  First Obtuse Marginal Branch  Vessel is small in size.  1st Mrg lesion is 70% stenosed. The lesion is concentric.  Second Obtuse Marginal Branch  Vessel is moderate in size. Vessel is angiographically normal.  First Left Posterolateral Branch  Vessel is normal in size.  1st LPL lesion is 99% stenosed. The lesion is chronically occluded.  Left Posterior Atrioventricular Artery  Vessel is large in size.  Right Coronary Artery  Vessel is small.  Ost RCA to Prox RCA lesion is 100% stenosed. The lesion is chronically occluded.  Intervention  No interventions have been documented. Right Heart  Right Heart Pressures PAP-mean: 27/12 mmHg - 18 mmHg PCWP ~10 mmHg LV EDP is normal. LV P-EDP: 168/5 mmHg - 12 mmHg  Ao sat 99%, PA sat 66%. Cardiac Output-Index Kathlen Brunswick): 5.24-2.49  Right Atrium RAP mean: 4 mmHg  Right Ventricle RVP-EDP: 27/2 mmHg - 6 mmHg   Wall Motion  No LV Gram         Left Heart  Left Ventricle LV end diastolic pressure is normal.  Aortic Valve There is moderate aortic valve stenosis. The aortic valve is calcified. Mean gradient by cath 27 mmHg.   Coronary Diagrams  Diagnostic Dominance: Left Intervention  Implants     No implant documentation for this case.   Syngo Images   Show images for CARDIAC CATHETERIZATION Images on Long Term Storage   Show images for Yossef, [place] to Procedure Log  Procedure Log    Hemo Data  Flowsheet Row Most Recent Value  Fick Cardiac Output 5.24 L/min  Fick Cardiac Output Index 2.49 (L/min)/BSA  Aortic Mean Gradient 27.1 mmHg  Aortic Peak Gradient 23 mmHg  Aortic Valve Area 1.20  Aortic Value Area Index 0.57 cm2/BSA  RA A Wave 6 mmHg  RA V Wave 3 mmHg  RA Mean 3 mmHg  RV Systolic Pressure 27 mmHg  RV Diastolic Pressure 2 mmHg  RV EDP 6 mmHg  PA Systolic Pressure 27 mmHg  PA Diastolic Pressure 12  mmHg  PA Mean 18 mmHg  PW A Wave 12 mmHg  PW V Wave 9 mmHg  PW Mean 9 mmHg  AO Systolic Pressure 193 mmHg  AO Diastolic Pressure 69 mmHg  AO Mean 99 mmHg  LV Systolic Pressure 790 mmHg  LV Diastolic Pressure 5 mmHg  LV EDP 11 mmHg  AOp Systolic Pressure 568 mmHg  AOp Diastolic Pressure 66 mmHg  AOp Mean Pressure 96 mmHg  LVp Systolic Pressure 616 mmHg  LVp Diastolic Pressure 4 mmHg  LVp EDP Pressure 13 mmHg  QP/QS 1  TPVR Index 7.22 HRUI  TSVR Index 39.76 HRUI  PVR SVR Ratio 0.09  TPVR/TSVR Ratio 0.18      Impression:   This 60 year old gentleman has stage D, severe, symptomatic aortic stenosis and moderate aortic insufficiency with New York Heart Association class II symptoms of exertional fatigue and shortness of breath consistent with chronic diastolic congestive heart failure.  He also has a history of coronary artery disease with an inferior MI in 2010 treated with a stent and subsequent known occlusion of the right coronary artery by catheterization in 2018.  I have personally reviewed his 2D echocardiogram, TEE and cardiac cath.  His echocardiogram shows a calcified aortic valve with restricted leaflet mobility.  The mean gradient is 43 mmHg consistent with severe aortic stenosis.  The pressure half-time was 609 ms suggesting mild to moderate aortic insufficiency although visually looks moderate to me.  He has at least moderate concentric LVH with normal left ventricular systolic function.  Cath shows multivessel coronary artery disease. I agree that aortic valve replacement and CABG is indicated to relieve his symptoms and prevent progressive left ventricular deterioration.  Given his age and multivessel CAD I think open surgical aortic valve replacement and CABG is the best treatment for him.  I discussed the alternatives of mechanical and bioprosthetic valves.  I recommend a bioprosthetic valve given his history of anemia and GI bleeding and his age of almost 60 years old.  He is  in agreement with that.  I reviewed his echo, TEE, and cath images with him and answered all of his questions.  I stressed the importance of smoking cessation preoperatively and preventing complications such as pneumonia and sternal nonhealing as well as long-term risks of smoking.  I discussed the operative procedure with the patient and family including alternatives, benefits and risks; including but not limited to bleeding, blood transfusion, infection, stroke, myocardial infarction, graft failure, heart block requiring a permanent pacemaker, organ dysfunction, and death.  [name] understands and agrees to proceed.    Plan:   Aortic valve replacement and coronary artery bypass graft surgery.       Gaye Pollack, MD

## 2020-10-14 ENCOUNTER — Inpatient Hospital Stay (HOSPITAL_COMMUNITY): Payer: Medicaid Other

## 2020-10-14 ENCOUNTER — Inpatient Hospital Stay (HOSPITAL_COMMUNITY): Payer: Medicaid Other | Admitting: Physician Assistant

## 2020-10-14 ENCOUNTER — Inpatient Hospital Stay (HOSPITAL_COMMUNITY): Payer: Medicaid Other | Admitting: Anesthesiology

## 2020-10-14 ENCOUNTER — Encounter (HOSPITAL_COMMUNITY): Payer: Self-pay | Admitting: Surgery

## 2020-10-14 ENCOUNTER — Inpatient Hospital Stay (HOSPITAL_COMMUNITY)
Admission: RE | Admit: 2020-10-14 | Discharge: 2020-10-19 | DRG: 220 | Disposition: A | Discharge status: Home / Self Care | Payer: Medicaid Other | Attending: Surgery | Admitting: Surgery

## 2020-10-14 ENCOUNTER — Other Ambulatory Visit: Payer: Self-pay

## 2020-10-14 ENCOUNTER — Inpatient Hospital Stay (HOSPITAL_COMMUNITY)
Admission: RE | Disposition: A | Discharge status: Home / Self Care | Payer: Self-pay | Source: Home / Self Care | Attending: Surgery

## 2020-10-14 DIAGNOSIS — H401133 Primary open-angle glaucoma, bilateral, severe stage: Secondary | ICD-10-CM | POA: Diagnosis present

## 2020-10-14 DIAGNOSIS — H401123 Primary open-angle glaucoma, left eye, severe stage: Secondary | ICD-10-CM | POA: Diagnosis present

## 2020-10-14 DIAGNOSIS — Z8719 Personal history of other diseases of the digestive system: Secondary | ICD-10-CM | POA: Diagnosis not present

## 2020-10-14 DIAGNOSIS — Z955 Presence of coronary angioplasty implant and graft: Secondary | ICD-10-CM | POA: Diagnosis not present

## 2020-10-14 DIAGNOSIS — Z635 Disruption of family by separation and divorce: Secondary | ICD-10-CM

## 2020-10-14 DIAGNOSIS — I2582 Chronic total occlusion of coronary artery: Secondary | ICD-10-CM | POA: Diagnosis present

## 2020-10-14 DIAGNOSIS — F17210 Nicotine dependence, cigarettes, uncomplicated: Secondary | ICD-10-CM | POA: Diagnosis present

## 2020-10-14 DIAGNOSIS — H25813 Combined forms of age-related cataract, bilateral: Secondary | ICD-10-CM | POA: Diagnosis present

## 2020-10-14 DIAGNOSIS — Z951 Presence of aortocoronary bypass graft: Secondary | ICD-10-CM

## 2020-10-14 DIAGNOSIS — J449 Chronic obstructive pulmonary disease, unspecified: Secondary | ICD-10-CM | POA: Diagnosis present

## 2020-10-14 DIAGNOSIS — Z833 Family history of diabetes mellitus: Secondary | ICD-10-CM

## 2020-10-14 DIAGNOSIS — Z884 Allergy status to anesthetic agent status: Secondary | ICD-10-CM

## 2020-10-14 DIAGNOSIS — Z8249 Family history of ischemic heart disease and other diseases of the circulatory system: Secondary | ICD-10-CM | POA: Diagnosis not present

## 2020-10-14 DIAGNOSIS — Z952 Presence of prosthetic heart valve: Secondary | ICD-10-CM

## 2020-10-14 DIAGNOSIS — E8770 Fluid overload, unspecified: Secondary | ICD-10-CM | POA: Diagnosis not present

## 2020-10-14 DIAGNOSIS — I350 Nonrheumatic aortic (valve) stenosis: Secondary | ICD-10-CM

## 2020-10-14 DIAGNOSIS — I4891 Unspecified atrial fibrillation: Secondary | ICD-10-CM | POA: Diagnosis not present

## 2020-10-14 DIAGNOSIS — E785 Hyperlipidemia, unspecified: Secondary | ICD-10-CM | POA: Diagnosis present

## 2020-10-14 DIAGNOSIS — I10 Essential (primary) hypertension: Secondary | ICD-10-CM | POA: Diagnosis present

## 2020-10-14 DIAGNOSIS — D62 Acute posthemorrhagic anemia: Secondary | ICD-10-CM | POA: Diagnosis not present

## 2020-10-14 DIAGNOSIS — Z8711 Personal history of peptic ulcer disease: Secondary | ICD-10-CM | POA: Diagnosis not present

## 2020-10-14 DIAGNOSIS — Z006 Encounter for examination for normal comparison and control in clinical research program: Secondary | ICD-10-CM

## 2020-10-14 DIAGNOSIS — I351 Nonrheumatic aortic (valve) insufficiency: Secondary | ICD-10-CM

## 2020-10-14 DIAGNOSIS — I252 Old myocardial infarction: Secondary | ICD-10-CM

## 2020-10-14 DIAGNOSIS — Z7984 Long term (current) use of oral hypoglycemic drugs: Secondary | ICD-10-CM

## 2020-10-14 DIAGNOSIS — I083 Combined rheumatic disorders of mitral, aortic and tricuspid valves: Secondary | ICD-10-CM | POA: Diagnosis present

## 2020-10-14 DIAGNOSIS — I2510 Atherosclerotic heart disease of native coronary artery without angina pectoris: Secondary | ICD-10-CM | POA: Diagnosis present

## 2020-10-14 DIAGNOSIS — I440 Atrioventricular block, first degree: Secondary | ICD-10-CM | POA: Diagnosis present

## 2020-10-14 DIAGNOSIS — Z09 Encounter for follow-up examination after completed treatment for conditions other than malignant neoplasm: Secondary | ICD-10-CM

## 2020-10-14 DIAGNOSIS — Z79899 Other long term (current) drug therapy: Secondary | ICD-10-CM

## 2020-10-14 DIAGNOSIS — E119 Type 2 diabetes mellitus without complications: Secondary | ICD-10-CM | POA: Diagnosis present

## 2020-10-14 DIAGNOSIS — J9811 Atelectasis: Secondary | ICD-10-CM | POA: Diagnosis present

## 2020-10-14 HISTORY — PX: AORTIC VALVE REPLACEMENT: SHX41

## 2020-10-14 HISTORY — PX: TEE WITHOUT CARDIOVERSION: SHX5443

## 2020-10-14 HISTORY — PX: CORONARY ARTERY BYPASS GRAFT: SHX141

## 2020-10-14 LAB — POCT I-STAT 7, (LYTES, BLD GAS, ICA,H+H)
Acid-Base Excess: 2 mmol/L (ref 0.0–2.0)
Acid-Base Excess: 2 mmol/L (ref 0.0–2.0)
Acid-Base Excess: 0 mmol/L (ref 0.0–2.0)
Acid-Base Excess: 0 mmol/L (ref 0.0–2.0)
Acid-Base Excess: 1 mmol/L (ref 0.0–2.0)
Acid-base deficit: 1 mmol/L (ref 0.0–2.0)
Acid-base deficit: 1 mmol/L (ref 0.0–2.0)
Acid-base deficit: 3 mmol/L — ABNORMAL HIGH (ref 0.0–2.0)
Acid-base deficit: 4 mmol/L — ABNORMAL HIGH (ref 0.0–2.0)
Bicarbonate: 25.7 mmol/L (ref 20.0–28.0)
Bicarbonate: 27.1 mmol/L (ref 20.0–28.0)
Bicarbonate: 26.7 mmol/L (ref 20.0–28.0)
Bicarbonate: 25.8 mmol/L (ref 20.0–28.0)
Bicarbonate: 25.4 mmol/L (ref 20.0–28.0)
Bicarbonate: 25.5 mmol/L (ref 20.0–28.0)
Bicarbonate: 23.9 mmol/L (ref 20.0–28.0)
Bicarbonate: 22.1 mmol/L (ref 20.0–28.0)
Bicarbonate: 22.2 mmol/L (ref 20.0–28.0)
Calcium, Ion: 1.29 mmol/L (ref 1.15–1.40)
Calcium, Ion: 1.05 mmol/L — ABNORMAL LOW (ref 1.15–1.40)
Calcium, Ion: 1.15 mmol/L (ref 1.15–1.40)
Calcium, Ion: 1.16 mmol/L (ref 1.15–1.40)
Calcium, Ion: 1.14 mmol/L — ABNORMAL LOW (ref 1.15–1.40)
Calcium, Ion: 1.29 mmol/L (ref 1.15–1.40)
Calcium, Ion: 1.23 mmol/L (ref 1.15–1.40)
Calcium, Ion: 1.21 mmol/L (ref 1.15–1.40)
Calcium, Ion: 1.23 mmol/L (ref 1.15–1.40)
HCT: 34 % — ABNORMAL LOW (ref 39.0–52.0)
HCT: 26 % — ABNORMAL LOW (ref 39.0–52.0)
HCT: 27 % — ABNORMAL LOW (ref 39.0–52.0)
HCT: 26 % — ABNORMAL LOW (ref 39.0–52.0)
HCT: 26 % — ABNORMAL LOW (ref 39.0–52.0)
HCT: 26 % — ABNORMAL LOW (ref 39.0–52.0)
HCT: 25 % — ABNORMAL LOW (ref 39.0–52.0)
HCT: 27 % — ABNORMAL LOW (ref 39.0–52.0)
HCT: 27 % — ABNORMAL LOW (ref 39.0–52.0)
Hemoglobin: 11.6 g/dL — ABNORMAL LOW (ref 13.0–17.0)
Hemoglobin: 8.8 g/dL — ABNORMAL LOW (ref 13.0–17.0)
Hemoglobin: 9.2 g/dL — ABNORMAL LOW (ref 13.0–17.0)
Hemoglobin: 8.8 g/dL — ABNORMAL LOW (ref 13.0–17.0)
Hemoglobin: 8.8 g/dL — ABNORMAL LOW (ref 13.0–17.0)
Hemoglobin: 8.8 g/dL — ABNORMAL LOW (ref 13.0–17.0)
Hemoglobin: 8.5 g/dL — ABNORMAL LOW (ref 13.0–17.0)
Hemoglobin: 9.2 g/dL — ABNORMAL LOW (ref 13.0–17.0)
Hemoglobin: 9.2 g/dL — ABNORMAL LOW (ref 13.0–17.0)
O2 Saturation: 100 %
O2 Saturation: 100 %
O2 Saturation: 100 %
O2 Saturation: 100 %
O2 Saturation: 100 %
O2 Saturation: 100 %
O2 Saturation: 99 %
O2 Saturation: 99 %
O2 Saturation: 96 %
Patient temperature: 36.2
Patient temperature: 37
Patient temperature: 37.5
Potassium: 4.3 mmol/L (ref 3.5–5.1)
Potassium: 4.5 mmol/L (ref 3.5–5.1)
Potassium: 4.2 mmol/L (ref 3.5–5.1)
Potassium: 3.8 mmol/L (ref 3.5–5.1)
Potassium: 3 mmol/L — ABNORMAL LOW (ref 3.5–5.1)
Potassium: 3.6 mmol/L (ref 3.5–5.1)
Potassium: 3.2 mmol/L — ABNORMAL LOW (ref 3.5–5.1)
Potassium: 4.5 mmol/L (ref 3.5–5.1)
Potassium: 4.3 mmol/L (ref 3.5–5.1)
Sodium: 143 mmol/L (ref 135–145)
Sodium: 143 mmol/L (ref 135–145)
Sodium: 142 mmol/L (ref 135–145)
Sodium: 142 mmol/L (ref 135–145)
Sodium: 143 mmol/L (ref 135–145)
Sodium: 143 mmol/L (ref 135–145)
Sodium: 144 mmol/L (ref 135–145)
Sodium: 142 mmol/L (ref 135–145)
Sodium: 143 mmol/L (ref 135–145)
TCO2: 27 mmol/L (ref 22–32)
TCO2: 28 mmol/L (ref 22–32)
TCO2: 28 mmol/L (ref 22–32)
TCO2: 27 mmol/L (ref 22–32)
TCO2: 27 mmol/L (ref 22–32)
TCO2: 27 mmol/L (ref 22–32)
TCO2: 25 mmol/L (ref 22–32)
TCO2: 23 mmol/L (ref 22–32)
TCO2: 23 mmol/L (ref 22–32)
pCO2 arterial: 48.9 mmHg — ABNORMAL HIGH (ref 32.0–48.0)
pCO2 arterial: 45.1 mmHg (ref 32.0–48.0)
pCO2 arterial: 40.7 mmHg (ref 32.0–48.0)
pCO2 arterial: 47.2 mmHg (ref 32.0–48.0)
pCO2 arterial: 41.9 mmHg (ref 32.0–48.0)
pCO2 arterial: 43.9 mmHg (ref 32.0–48.0)
pCO2 arterial: 39.2 mmHg (ref 32.0–48.0)
pCO2 arterial: 40.2 mmHg (ref 32.0–48.0)
pCO2 arterial: 44.2 mmHg (ref 32.0–48.0)
pH, Arterial: 7.328 — ABNORMAL LOW (ref 7.350–7.450)
pH, Arterial: 7.388 (ref 7.350–7.450)
pH, Arterial: 7.425 (ref 7.350–7.450)
pH, Arterial: 7.346 — ABNORMAL LOW (ref 7.350–7.450)
pH, Arterial: 7.371 (ref 7.350–7.450)
pH, Arterial: 7.393 (ref 7.350–7.450)
pH, Arterial: 7.39 (ref 7.350–7.450)
pH, Arterial: 7.349 — ABNORMAL LOW (ref 7.350–7.450)
pH, Arterial: 7.311 — ABNORMAL LOW (ref 7.350–7.450)
pO2, Arterial: 244 mmHg — ABNORMAL HIGH (ref 83.0–108.0)
pO2, Arterial: 323 mmHg — ABNORMAL HIGH (ref 83.0–108.0)
pO2, Arterial: 403 mmHg — ABNORMAL HIGH (ref 83.0–108.0)
pO2, Arterial: 362 mmHg — ABNORMAL HIGH (ref 83.0–108.0)
pO2, Arterial: 257 mmHg — ABNORMAL HIGH (ref 83.0–108.0)
pO2, Arterial: 309 mmHg — ABNORMAL HIGH (ref 83.0–108.0)
pO2, Arterial: 142 mmHg — ABNORMAL HIGH (ref 83.0–108.0)
pO2, Arterial: 170 mmHg — ABNORMAL HIGH (ref 83.0–108.0)
pO2, Arterial: 93 mmHg (ref 83.0–108.0)

## 2020-10-14 LAB — GLUCOSE, CAPILLARY
Glucose-Capillary: 130 mg/dL — ABNORMAL HIGH (ref 70–99)
Glucose-Capillary: 92 mg/dL (ref 70–99)
Glucose-Capillary: 122 mg/dL — ABNORMAL HIGH (ref 70–99)
Glucose-Capillary: 136 mg/dL — ABNORMAL HIGH (ref 70–99)
Glucose-Capillary: 116 mg/dL — ABNORMAL HIGH (ref 70–99)
Glucose-Capillary: 101 mg/dL — ABNORMAL HIGH (ref 70–99)
Glucose-Capillary: 112 mg/dL — ABNORMAL HIGH (ref 70–99)
Glucose-Capillary: 150 mg/dL — ABNORMAL HIGH (ref 70–99)
Glucose-Capillary: 130 mg/dL — ABNORMAL HIGH (ref 70–99)
Glucose-Capillary: 121 mg/dL — ABNORMAL HIGH (ref 70–99)

## 2020-10-14 LAB — POCT I-STAT, CHEM 8
BUN: 15 mg/dL (ref 6–20)
BUN: 14 mg/dL (ref 6–20)
BUN: 14 mg/dL (ref 6–20)
BUN: 14 mg/dL (ref 6–20)
BUN: 13 mg/dL (ref 6–20)
BUN: 13 mg/dL (ref 6–20)
Calcium, Ion: 1.29 mmol/L (ref 1.15–1.40)
Calcium, Ion: 1.27 mmol/L (ref 1.15–1.40)
Calcium, Ion: 1.18 mmol/L (ref 1.15–1.40)
Calcium, Ion: 1.16 mmol/L (ref 1.15–1.40)
Calcium, Ion: 1.13 mmol/L — ABNORMAL LOW (ref 1.15–1.40)
Calcium, Ion: 1.29 mmol/L (ref 1.15–1.40)
Chloride: 108 mmol/L (ref 98–111)
Chloride: 109 mmol/L (ref 98–111)
Chloride: 107 mmol/L (ref 98–111)
Chloride: 108 mmol/L (ref 98–111)
Chloride: 107 mmol/L (ref 98–111)
Chloride: 109 mmol/L (ref 98–111)
Creatinine, Ser: 0.8 mg/dL (ref 0.61–1.24)
Creatinine, Ser: 0.8 mg/dL (ref 0.61–1.24)
Creatinine, Ser: 0.8 mg/dL (ref 0.61–1.24)
Creatinine, Ser: 0.8 mg/dL (ref 0.61–1.24)
Creatinine, Ser: 0.6 mg/dL — ABNORMAL LOW (ref 0.61–1.24)
Creatinine, Ser: 0.7 mg/dL (ref 0.61–1.24)
Glucose, Bld: 140 mg/dL — ABNORMAL HIGH (ref 70–99)
Glucose, Bld: 106 mg/dL — ABNORMAL HIGH (ref 70–99)
Glucose, Bld: 89 mg/dL (ref 70–99)
Glucose, Bld: 136 mg/dL — ABNORMAL HIGH (ref 70–99)
Glucose, Bld: 135 mg/dL — ABNORMAL HIGH (ref 70–99)
Glucose, Bld: 148 mg/dL — ABNORMAL HIGH (ref 70–99)
HCT: 34 % — ABNORMAL LOW (ref 39.0–52.0)
HCT: 31 % — ABNORMAL LOW (ref 39.0–52.0)
HCT: 28 % — ABNORMAL LOW (ref 39.0–52.0)
HCT: 28 % — ABNORMAL LOW (ref 39.0–52.0)
HCT: 26 % — ABNORMAL LOW (ref 39.0–52.0)
HCT: 26 % — ABNORMAL LOW (ref 39.0–52.0)
Hemoglobin: 11.6 g/dL — ABNORMAL LOW (ref 13.0–17.0)
Hemoglobin: 10.5 g/dL — ABNORMAL LOW (ref 13.0–17.0)
Hemoglobin: 9.5 g/dL — ABNORMAL LOW (ref 13.0–17.0)
Hemoglobin: 9.5 g/dL — ABNORMAL LOW (ref 13.0–17.0)
Hemoglobin: 8.8 g/dL — ABNORMAL LOW (ref 13.0–17.0)
Hemoglobin: 8.8 g/dL — ABNORMAL LOW (ref 13.0–17.0)
Potassium: 4.3 mmol/L (ref 3.5–5.1)
Potassium: 4.4 mmol/L (ref 3.5–5.1)
Potassium: 4.1 mmol/L (ref 3.5–5.1)
Potassium: 4.2 mmol/L (ref 3.5–5.1)
Potassium: 3 mmol/L — ABNORMAL LOW (ref 3.5–5.1)
Potassium: 3.9 mmol/L (ref 3.5–5.1)
Sodium: 142 mmol/L (ref 135–145)
Sodium: 142 mmol/L (ref 135–145)
Sodium: 142 mmol/L (ref 135–145)
Sodium: 141 mmol/L (ref 135–145)
Sodium: 141 mmol/L (ref 135–145)
Sodium: 142 mmol/L (ref 135–145)
TCO2: 26 mmol/L (ref 22–32)
TCO2: 24 mmol/L (ref 22–32)
TCO2: 27 mmol/L (ref 22–32)
TCO2: 26 mmol/L (ref 22–32)
TCO2: 25 mmol/L (ref 22–32)
TCO2: 26 mmol/L (ref 22–32)

## 2020-10-14 LAB — ECHO INTRAOPERATIVE TEE
AV Mean grad: 23 mmHg
AV Peak grad: 20.6 mmHg
Ao pk vel: 2.27 m/s
Height: 71 in
S' Lateral: 3.5 cm
Weight: 3120 oz

## 2020-10-14 LAB — POCT I-STAT EG7
Acid-Base Excess: 2 mmol/L (ref 0.0–2.0)
Bicarbonate: 27.4 mmol/L (ref 20.0–28.0)
Calcium, Ion: 1.13 mmol/L — ABNORMAL LOW (ref 1.15–1.40)
HCT: 26 % — ABNORMAL LOW (ref 39.0–52.0)
Hemoglobin: 8.8 g/dL — ABNORMAL LOW (ref 13.0–17.0)
O2 Saturation: 74 %
Potassium: 4.3 mmol/L (ref 3.5–5.1)
Sodium: 145 mmol/L (ref 135–145)
TCO2: 29 mmol/L (ref 22–32)
pCO2, Ven: 49.3 mmHg (ref 44.0–60.0)
pH, Ven: 7.354 (ref 7.250–7.430)
pO2, Ven: 42 mmHg (ref 32.0–45.0)

## 2020-10-14 LAB — CBC
HCT: 27 % — ABNORMAL LOW (ref 39.0–52.0)
HCT: 26.4 % — ABNORMAL LOW (ref 39.0–52.0)
Hemoglobin: 8.7 g/dL — ABNORMAL LOW (ref 13.0–17.0)
Hemoglobin: 8.5 g/dL — ABNORMAL LOW (ref 13.0–17.0)
MCH: 26.8 pg (ref 26.0–34.0)
MCH: 26.6 pg (ref 26.0–34.0)
MCHC: 32.2 g/dL (ref 30.0–36.0)
MCHC: 32.2 g/dL (ref 30.0–36.0)
MCV: 83.1 fL (ref 80.0–100.0)
MCV: 82.8 fL (ref 80.0–100.0)
Platelets: 120 10*3/uL — ABNORMAL LOW (ref 150–400)
Platelets: 133 10*3/uL — ABNORMAL LOW (ref 150–400)
RBC: 3.25 MIL/uL — ABNORMAL LOW (ref 4.22–5.81)
RBC: 3.19 MIL/uL — ABNORMAL LOW (ref 4.22–5.81)
RDW: 20 % — ABNORMAL HIGH (ref 11.5–15.5)
RDW: 20.6 % — ABNORMAL HIGH (ref 11.5–15.5)
WBC: 10.4 10*3/uL (ref 4.0–10.5)
WBC: 14.5 10*3/uL — ABNORMAL HIGH (ref 4.0–10.5)
nRBC: 0 % (ref 0.0–0.2)
nRBC: 0 % (ref 0.0–0.2)

## 2020-10-14 LAB — BASIC METABOLIC PANEL
Anion gap: 4 — ABNORMAL LOW (ref 5–15)
BUN: 12 mg/dL (ref 6–20)
CO2: 23 mmol/L (ref 22–32)
Calcium: 7.9 mg/dL — ABNORMAL LOW (ref 8.9–10.3)
Chloride: 111 mmol/L (ref 98–111)
Creatinine, Ser: 0.92 mg/dL (ref 0.61–1.24)
GFR, Estimated: >60 mL/min (ref >60)
Glucose, Bld: 104 mg/dL — ABNORMAL HIGH (ref 70–99)
Potassium: 4.2 mmol/L (ref 3.5–5.1)
Sodium: 138 mmol/L (ref 135–145)

## 2020-10-14 LAB — PLATELET COUNT: Platelets: 142 10*3/uL — ABNORMAL LOW (ref 150–400)

## 2020-10-14 LAB — PROTIME-INR
INR: 1.3 — ABNORMAL HIGH (ref 0.8–1.2)
Prothrombin Time: 16.1 seconds — ABNORMAL HIGH (ref 11.4–15.2)

## 2020-10-14 LAB — APTT: aPTT: 29 seconds (ref 24–36)

## 2020-10-14 SURGERY — REPLACEMENT, AORTIC VALVE, OPEN
Anesthesia: General | Site: Chest

## 2020-10-14 MED ORDER — OLOPATADINE HCL 0.1 % OP SOLN: 1.0000 [drp] | Freq: Two times a day (BID) | OPHTHALMIC | Status: DC

## 2020-10-14 MED ORDER — CALCIUM CHLORIDE 10 % IV SOLN
INTRAVENOUS | Status: AC
  Filled 2020-10-14: qty 10

## 2020-10-14 MED ORDER — SODIUM CHLORIDE 0.9 % IV SOLN: INTRAVENOUS | Status: DC

## 2020-10-14 MED ORDER — OXYCODONE HCL 5 MG PO TABS
5.0000 mg | ORAL_TABLET | ORAL | Status: DC | PRN
  Administered 2020-10-14: 5 mg via ORAL
  Administered 2020-10-15 (×5): 10 mg via ORAL
  Administered 2020-10-15: 5 mg via ORAL
  Administered 2020-10-16 – 2020-10-17 (×4): 10 mg via ORAL
  Filled 2020-10-14: qty 1
  Filled 2020-10-14 (×4): qty 2
  Filled 2020-10-14: qty 1
  Filled 2020-10-14 (×5): qty 2

## 2020-10-14 MED ORDER — LACTATED RINGERS IV SOLN: INTRAVENOUS | Status: DC | PRN

## 2020-10-14 MED ORDER — DEXMEDETOMIDINE HCL IN NACL 400 MCG/100ML IV SOLN
0.1000 ug/kg/h | INTRAVENOUS | Status: DC
  Filled 2020-10-14: qty 100

## 2020-10-14 MED ORDER — METOPROLOL TARTRATE 12.5 MG HALF TABLET: 12.5000 mg | ORAL_TABLET | Freq: Two times a day (BID) | ORAL | Status: DC

## 2020-10-14 MED ORDER — ALBUMIN HUMAN 5 % IV SOLN: INTRAVENOUS | Status: DC | PRN

## 2020-10-14 MED ORDER — ~~LOC~~ CARDIAC SURGERY, PATIENT & FAMILY EDUCATION
Freq: Once | Status: DC
  Filled 2020-10-14: qty 1

## 2020-10-14 MED ORDER — PROTAMINE SULFATE 10 MG/ML IV SOLN
INTRAVENOUS | Status: AC
  Filled 2020-10-14: qty 10

## 2020-10-14 MED ORDER — PROPOFOL 10 MG/ML IV BOLUS
INTRAVENOUS | Status: AC
  Filled 2020-10-14: qty 20

## 2020-10-14 MED ORDER — ASPIRIN EC 325 MG PO TBEC
325.0000 mg | DELAYED_RELEASE_TABLET | Freq: Every day | ORAL | Status: DC
  Administered 2020-10-15 – 2020-10-17 (×3): 325 mg via ORAL
  Filled 2020-10-14 (×3): qty 1

## 2020-10-14 MED ORDER — METOPROLOL TARTRATE 25 MG/10 ML ORAL SUSPENSION: 12.5000 mg | Freq: Two times a day (BID) | ORAL | Status: DC

## 2020-10-14 MED ORDER — HEMOSTATIC AGENTS (NO CHARGE) OPTIME
TOPICAL | Status: DC | PRN
  Administered 2020-10-14: 1 via TOPICAL

## 2020-10-14 MED ORDER — DEXTROSE 50 % IV SOLN: 0.0000 mL | INTRAVENOUS | Status: DC | PRN

## 2020-10-14 MED ORDER — PANTOPRAZOLE SODIUM 40 MG PO TBEC
40.0000 mg | DELAYED_RELEASE_TABLET | Freq: Every day | ORAL | Status: DC
  Administered 2020-10-16 – 2020-10-17 (×2): 40 mg via ORAL
  Filled 2020-10-14 (×2): qty 1

## 2020-10-14 MED ORDER — THROMBIN 20000 UNITS EX SOLR
OROMUCOSAL | Status: DC | PRN
  Administered 2020-10-14: 12 mL via TOPICAL

## 2020-10-14 MED ORDER — MORPHINE SULFATE (PF) 2 MG/ML IV SOLN
1.0000 mg | INTRAVENOUS | Status: DC | PRN
  Administered 2020-10-14 – 2020-10-15 (×9): 2 mg via INTRAVENOUS
  Filled 2020-10-14 (×9): qty 1

## 2020-10-14 MED ORDER — THROMBIN (RECOMBINANT) 20000 UNITS EX SOLR
CUTANEOUS | Status: AC
  Filled 2020-10-14: qty 20000

## 2020-10-14 MED ORDER — CHLORHEXIDINE GLUCONATE 0.12 % MT SOLN
OROMUCOSAL | Status: AC
  Filled 2020-10-14: qty 15

## 2020-10-14 MED ORDER — ALBUMIN HUMAN 5 % IV SOLN
250.0000 mL | INTRAVENOUS | Status: DC | PRN
Start: 2020-10-14 — End: 2020-10-15
  Administered 2020-10-14 (×2): 12.5 g via INTRAVENOUS

## 2020-10-14 MED ORDER — HEPARIN SODIUM (PORCINE) 1000 UNIT/ML IJ SOLN
INTRAMUSCULAR | Status: AC
  Filled 2020-10-14: qty 1

## 2020-10-14 MED ORDER — PROPOFOL 10 MG/ML IV BOLUS
INTRAVENOUS | Status: DC | PRN
  Administered 2020-10-14: 130 mg via INTRAVENOUS

## 2020-10-14 MED ORDER — INSULIN REGULAR(HUMAN) IN NACL 100-0.9 UT/100ML-% IV SOLN
INTRAVENOUS | Status: DC | PRN
  Administered 2020-10-14: 3.8 [IU]/h via INTRAVENOUS

## 2020-10-14 MED ORDER — METOPROLOL TARTRATE 12.5 MG HALF TABLET: 12.5000 mg | ORAL_TABLET | Freq: Once | ORAL | Status: DC

## 2020-10-14 MED ORDER — ONDANSETRON HCL 4 MG/2ML IJ SOLN
4.0000 mg | Freq: Four times a day (QID) | INTRAMUSCULAR | Status: DC | PRN
Start: 2020-10-14 — End: 2020-10-17
  Administered 2020-10-16 (×2): 4 mg via INTRAVENOUS
  Filled 2020-10-14 (×2): qty 2

## 2020-10-14 MED ORDER — MIDAZOLAM HCL (PF) 5 MG/ML IJ SOLN
INTRAMUSCULAR | Status: DC | PRN
  Administered 2020-10-14 (×4): 1 mg via INTRAVENOUS
  Administered 2020-10-14: 2 mg via INTRAVENOUS

## 2020-10-14 MED ORDER — FAMOTIDINE 20 MG IN NS 100 ML IVPB
20.0000 mg | Freq: Two times a day (BID) | INTRAVENOUS | Status: DC
  Administered 2020-10-14: 20 mg via INTRAVENOUS
  Filled 2020-10-14: qty 100

## 2020-10-14 MED ORDER — SODIUM CHLORIDE 0.9% FLUSH
3.0000 mL | Freq: Two times a day (BID) | INTRAVENOUS | Status: DC
  Administered 2020-10-15 – 2020-10-16 (×3): 3 mL via INTRAVENOUS
  Administered 2020-10-16: 10 mL via INTRAVENOUS
  Administered 2020-10-17: 3 mL via INTRAVENOUS

## 2020-10-14 MED ORDER — SODIUM CHLORIDE 0.45 % IV SOLN
INTRAVENOUS | Status: DC | PRN
  Administered 2020-10-14: 500 mL via INTRAVENOUS

## 2020-10-14 MED ORDER — NITROGLYCERIN IN D5W 200-5 MCG/ML-% IV SOLN
INTRAVENOUS | Status: DC | PRN
  Administered 2020-10-14: 20 ug/min via INTRAVENOUS

## 2020-10-14 MED ORDER — ROCURONIUM BROMIDE 10 MG/ML (PF) SYRINGE
PREFILLED_SYRINGE | INTRAVENOUS | Status: DC | PRN
  Administered 2020-10-14: 100 mg via INTRAVENOUS
  Administered 2020-10-14 (×4): 50 mg via INTRAVENOUS

## 2020-10-14 MED ORDER — BISACODYL 5 MG PO TBEC
10.0000 mg | DELAYED_RELEASE_TABLET | Freq: Every day | ORAL | Status: DC
  Administered 2020-10-15 – 2020-10-17 (×3): 10 mg via ORAL
  Filled 2020-10-14 (×3): qty 2

## 2020-10-14 MED ORDER — CEFAZOLIN SODIUM-DEXTROSE 2-4 GM/100ML-% IV SOLN
2.0000 g | Freq: Three times a day (TID) | INTRAVENOUS | Status: AC
  Administered 2020-10-14 – 2020-10-16 (×6): 2 g via INTRAVENOUS
  Filled 2020-10-14 (×6): qty 100

## 2020-10-14 MED ORDER — CHLORHEXIDINE GLUCONATE 4 % EX LIQD: 30.0000 mL | CUTANEOUS | Status: DC

## 2020-10-14 MED ORDER — SODIUM CHLORIDE 0.9 % IV SOLN: 250.0000 mL | INTRAVENOUS | Status: DC

## 2020-10-14 MED ORDER — 0.9 % SODIUM CHLORIDE (POUR BTL) OPTIME
TOPICAL | Status: DC | PRN
  Administered 2020-10-14: 5000 mL

## 2020-10-14 MED ORDER — POTASSIUM CHLORIDE 10 MEQ/50ML IV SOLN
10.0000 meq | INTRAVENOUS | Status: AC
  Administered 2020-10-14 (×3): 10 meq via INTRAVENOUS

## 2020-10-14 MED ORDER — MIDAZOLAM HCL 2 MG/2ML IJ SOLN: 2.0000 mg | INTRAMUSCULAR | Status: DC | PRN

## 2020-10-14 MED ORDER — LACTATED RINGERS IV SOLN: INTRAVENOUS | Status: DC

## 2020-10-14 MED ORDER — PHENYLEPHRINE HCL-NACL 20-0.9 MG/250ML-% IV SOLN: 0.0000 ug/min | INTRAVENOUS | Status: DC

## 2020-10-14 MED ORDER — FENTANYL CITRATE (PF) 250 MCG/5ML IJ SOLN
INTRAMUSCULAR | Status: AC
  Filled 2020-10-14: qty 5

## 2020-10-14 MED ORDER — SODIUM CHLORIDE 0.9% FLUSH
3.0000 mL | INTRAVENOUS | Status: DC | PRN
Start: 2020-10-15 — End: 2020-10-17

## 2020-10-14 MED ORDER — INSULIN REGULAR(HUMAN) IN NACL 100-0.9 UT/100ML-% IV SOLN: INTRAVENOUS | Status: DC

## 2020-10-14 MED ORDER — DEXMEDETOMIDINE HCL IN NACL 400 MCG/100ML IV SOLN: INTRAVENOUS | Status: DC | PRN

## 2020-10-14 MED ORDER — ASPIRIN 81 MG PO CHEW: 324.0000 mg | CHEWABLE_TABLET | Freq: Every day | ORAL | Status: DC

## 2020-10-14 MED ORDER — ARTIFICIAL TEARS OPHTHALMIC OINT
TOPICAL_OINTMENT | OPHTHALMIC | Status: DC | PRN
  Administered 2020-10-14: 1 via OPHTHALMIC

## 2020-10-14 MED ORDER — FENTANYL CITRATE (PF) 250 MCG/5ML IJ SOLN
INTRAMUSCULAR | Status: AC
  Filled 2020-10-14: qty 25

## 2020-10-14 MED ORDER — DEXMEDETOMIDINE HCL IN NACL 400 MCG/100ML IV SOLN: 0.0000 ug/kg/h | INTRAVENOUS | Status: DC

## 2020-10-14 MED ORDER — ACETAMINOPHEN 500 MG PO TABS
1000.0000 mg | ORAL_TABLET | Freq: Four times a day (QID) | ORAL | Status: DC
  Administered 2020-10-15 – 2020-10-19 (×16): 1000 mg via ORAL
  Filled 2020-10-14 (×16): qty 2

## 2020-10-14 MED ORDER — CALCIUM CHLORIDE 10 % IV SOLN
INTRAVENOUS | Status: DC | PRN
  Administered 2020-10-14 (×2): 250 mg via INTRAVENOUS

## 2020-10-14 MED ORDER — METOPROLOL TARTRATE 5 MG/5ML IV SOLN
2.5000 mg | INTRAVENOUS | Status: DC | PRN
  Administered 2020-10-15: 5 mg via INTRAVENOUS
  Filled 2020-10-14: qty 5

## 2020-10-14 MED ORDER — FENTANYL CITRATE (PF) 100 MCG/2ML IJ SOLN
INTRAMUSCULAR | Status: DC | PRN
  Administered 2020-10-14: 100 ug via INTRAVENOUS
  Administered 2020-10-14: 250 ug via INTRAVENOUS
  Administered 2020-10-14: 100 ug via INTRAVENOUS
  Administered 2020-10-14 (×2): 50 ug via INTRAVENOUS
  Administered 2020-10-14: 200 ug via INTRAVENOUS
  Administered 2020-10-14 (×2): 50 ug via INTRAVENOUS
  Administered 2020-10-14 (×3): 100 ug via INTRAVENOUS
  Administered 2020-10-14: 250 ug via INTRAVENOUS

## 2020-10-14 MED ORDER — AMIODARONE HCL IN DEXTROSE 360-4.14 MG/200ML-% IV SOLN
30.0000 mg/h | INTRAVENOUS | Status: DC
  Administered 2020-10-15: 30 mg/h via INTRAVENOUS

## 2020-10-14 MED ORDER — TRAMADOL HCL 50 MG PO TABS
50.0000 mg | ORAL_TABLET | ORAL | Status: DC | PRN
  Administered 2020-10-15 – 2020-10-17 (×5): 100 mg via ORAL
  Filled 2020-10-14 (×5): qty 2

## 2020-10-14 MED ORDER — ACETAMINOPHEN 650 MG RE SUPP
650.0000 mg | Freq: Once | RECTAL | Status: AC
  Administered 2020-10-14: 650 mg via RECTAL

## 2020-10-14 MED ORDER — ROCURONIUM BROMIDE 10 MG/ML (PF) SYRINGE
PREFILLED_SYRINGE | INTRAVENOUS | Status: AC
  Filled 2020-10-14: qty 40

## 2020-10-14 MED ORDER — HEPARIN SODIUM (PORCINE) 1000 UNIT/ML IJ SOLN
INTRAMUSCULAR | Status: DC | PRN
  Administered 2020-10-14: 32000 [IU] via INTRAVENOUS

## 2020-10-14 MED ORDER — NITROGLYCERIN IN D5W 200-5 MCG/ML-% IV SOLN
0.0000 ug/min | INTRAVENOUS | Status: DC
Start: 2020-10-14 — End: 2020-10-16

## 2020-10-14 MED ORDER — VANCOMYCIN HCL IN DEXTROSE 1-5 GM/200ML-% IV SOLN
1000.0000 mg | Freq: Once | INTRAVENOUS | Status: AC
  Administered 2020-10-14: 1000 mg via INTRAVENOUS
  Filled 2020-10-14: qty 200

## 2020-10-14 MED ORDER — PROTAMINE SULFATE 10 MG/ML IV SOLN
INTRAVENOUS | Status: AC
  Filled 2020-10-14: qty 25

## 2020-10-14 MED ORDER — THROMBIN 20000 UNITS EX SOLR
CUTANEOUS | Status: DC | PRN
  Administered 2020-10-14: 20000 [IU] via TOPICAL

## 2020-10-14 MED ORDER — PROTAMINE SULFATE 10 MG/ML IV SOLN
INTRAVENOUS | Status: DC | PRN
  Administered 2020-10-14: 320 mg via INTRAVENOUS

## 2020-10-14 MED ORDER — ACETAMINOPHEN 160 MG/5ML PO SOLN: 1000.0000 mg | Freq: Four times a day (QID) | ORAL | Status: DC

## 2020-10-14 MED ORDER — LACTATED RINGERS IV SOLN: 500.0000 mL | Freq: Once | INTRAVENOUS | Status: DC | PRN

## 2020-10-14 MED ORDER — METOPROLOL TARTRATE 12.5 MG HALF TABLET
ORAL_TABLET | ORAL | Status: AC
  Filled 2020-10-14: qty 1

## 2020-10-14 MED ORDER — MAGNESIUM SULFATE 4 GM/100ML IV SOLN
4.0000 g | Freq: Once | INTRAVENOUS | Status: AC
  Administered 2020-10-14: 4 g via INTRAVENOUS
  Filled 2020-10-14: qty 100

## 2020-10-14 MED ORDER — AMIODARONE HCL IN DEXTROSE 360-4.14 MG/200ML-% IV SOLN
30.0000 mg/h | INTRAVENOUS | Status: AC
  Filled 2020-10-14: qty 200

## 2020-10-14 MED ORDER — LACTATED RINGERS IV SOLN
INTRAVENOUS | Status: DC
Start: 2020-10-14 — End: 2020-10-17

## 2020-10-14 MED ORDER — ACETAMINOPHEN 160 MG/5ML PO SOLN: 650.0000 mg | Freq: Once | ORAL | Status: AC

## 2020-10-14 MED ORDER — CHLORHEXIDINE GLUCONATE 0.12 % MT SOLN
15.0000 mL | OROMUCOSAL | Status: AC
  Administered 2020-10-14: 15 mL via OROMUCOSAL

## 2020-10-14 MED ORDER — CHLORHEXIDINE GLUCONATE CLOTH 2 % EX PADS
6.0000 | MEDICATED_PAD | Freq: Every day | CUTANEOUS | Status: DC
  Administered 2020-10-14 – 2020-10-17 (×4): 6 via TOPICAL

## 2020-10-14 MED ORDER — DOCUSATE SODIUM 100 MG PO CAPS
200.0000 mg | ORAL_CAPSULE | Freq: Every day | ORAL | Status: DC
  Administered 2020-10-15 – 2020-10-17 (×3): 200 mg via ORAL
  Filled 2020-10-14 (×3): qty 2

## 2020-10-14 MED ORDER — BISACODYL 10 MG RE SUPP: 10.0000 mg | Freq: Every day | RECTAL | Status: DC

## 2020-10-14 MED ORDER — CHLORHEXIDINE GLUCONATE 0.12 % MT SOLN
15.0000 mL | Freq: Once | OROMUCOSAL | Status: AC
  Administered 2020-10-14: 15 mL via OROMUCOSAL

## 2020-10-14 MED ORDER — AMIODARONE HCL IN DEXTROSE 360-4.14 MG/200ML-% IV SOLN
INTRAVENOUS | Status: DC | PRN
  Administered 2020-10-14: 30 mg/h via INTRAVENOUS

## 2020-10-14 MED ORDER — MIDAZOLAM HCL (PF) 10 MG/2ML IJ SOLN
INTRAMUSCULAR | Status: AC
  Filled 2020-10-14: qty 2

## 2020-10-14 MED ORDER — PLASMA-LYTE A IV SOLN
INTRAVENOUS | Status: DC | PRN
  Administered 2020-10-14: 1000 mL

## 2020-10-14 SURGICAL SUPPLY — 121 items
ADAPTER CARDIO PERF ANTE/RETRO (ADAPTER) ×3 IMPLANT
ADH SKN CLS APL DERMABOND .7 (GAUZE/BANDAGES/DRESSINGS) ×2
ADPR PRFSN 84XANTGRD RTRGD (ADAPTER) ×1
BAG DECANTER FOR FLEXI CONT (MISCELLANEOUS) ×3 IMPLANT
BLADE CLIPPER SURG (BLADE) ×3 IMPLANT
BLADE STERNUM SYSTEM 6 (BLADE) ×3 IMPLANT
BLADE SURG 15 STRL LF DISP TIS (BLADE) ×2 IMPLANT
BLADE SURG 15 STRL SS (BLADE) ×3
BNDG ELASTIC 4X5.8 VLCR STR LF (GAUZE/BANDAGES/DRESSINGS) ×3 IMPLANT
BNDG ELASTIC 6X5.8 VLCR STR LF (GAUZE/BANDAGES/DRESSINGS) ×3 IMPLANT
BNDG GAUZE ELAST 4 BULKY (GAUZE/BANDAGES/DRESSINGS) ×3 IMPLANT
CANISTER SUCT 3000ML PPV (MISCELLANEOUS) ×3 IMPLANT
CANNULA GUNDRY RCSP 15FR (MISCELLANEOUS) ×3 IMPLANT
CATH HEART VENT LEFT (CATHETERS) ×2 IMPLANT
CATH ROBINSON RED A/P 18FR (CATHETERS) ×9 IMPLANT
CATH THORACIC 28FR (CATHETERS) ×3 IMPLANT
CATH THORACIC 36FR (CATHETERS) ×3 IMPLANT
CATH THORACIC 36FR RT ANG (CATHETERS) ×3 IMPLANT
CLIP TI WIDE RED SMALL 24 (CLIP) ×3 IMPLANT
CLIP VESOCCLUDE MED 24/CT (CLIP) IMPLANT
CLIP VESOCCLUDE SM WIDE 24/CT (CLIP) IMPLANT
CNTNR URN SCR LID CUP LEK RST (MISCELLANEOUS) ×2 IMPLANT
CONN 3/8X3/8 GISH STERILE (MISCELLANEOUS) ×3 IMPLANT
CONT SPEC 4OZ STRL OR WHT (MISCELLANEOUS) ×3
CONTAINER PROTECT SURGISLUSH (MISCELLANEOUS) ×6 IMPLANT
COVER SURGICAL LIGHT HANDLE (MISCELLANEOUS) ×3 IMPLANT
DERMABOND ADVANCED (GAUZE/BANDAGES/DRESSINGS) ×1
DERMABOND ADVANCED .7 DNX12 (GAUZE/BANDAGES/DRESSINGS) ×2 IMPLANT
DEVICE SUT CK QUICK LOAD INDV (Prosthesis & Implant Heart) ×9 IMPLANT
DEVICE SUT CK QUICK LOAD MINI (Prosthesis & Implant Heart) ×3 IMPLANT
DRAPE CARDIOVASCULAR INCISE (DRAPES) ×3
DRAPE SRG 135X102X78XABS (DRAPES) ×2 IMPLANT
DRAPE WARM FLUID 44X44 (DRAPES) ×3 IMPLANT
DRSG COVADERM 4X14 (GAUZE/BANDAGES/DRESSINGS) ×3 IMPLANT
ELECT CAUTERY BLADE 6.4 (BLADE) ×3 IMPLANT
ELECT REM PT RETURN 9FT ADLT (ELECTROSURGICAL) ×6
ELECTRODE REM PT RTRN 9FT ADLT (ELECTROSURGICAL) ×4 IMPLANT
FELT TEFLON 1X6 (MISCELLANEOUS) ×6 IMPLANT
GAUZE SPONGE 4X4 12PLY STRL (GAUZE/BANDAGES/DRESSINGS) ×6 IMPLANT
GAUZE SPONGE 4X4 12PLY STRL LF (GAUZE/BANDAGES/DRESSINGS) ×3 IMPLANT
GLOVE SURG ENC MOIS LTX SZ6 (GLOVE) IMPLANT
GLOVE SURG ENC MOIS LTX SZ6.5 (GLOVE) IMPLANT
GLOVE SURG ENC MOIS LTX SZ7 (GLOVE) IMPLANT
GLOVE SURG ENC MOIS LTX SZ7.5 (GLOVE) IMPLANT
GLOVE SURG MICRO LTX SZ6 (GLOVE) ×3 IMPLANT
GLOVE SURG MICRO LTX SZ7 (GLOVE) ×6 IMPLANT
GLOVE SURG ORTHO LTX SZ7.5 (GLOVE) IMPLANT
GLOVE SURG UNDER POLY LF SZ6 (GLOVE) ×3 IMPLANT
GLOVE SURG UNDER POLY LF SZ6.5 (GLOVE) IMPLANT
GLOVE SURG UNDER POLY LF SZ7 (GLOVE) IMPLANT
GOWN STRL REUS W/ TWL LRG LVL3 (GOWN DISPOSABLE) ×8 IMPLANT
GOWN STRL REUS W/ TWL XL LVL3 (GOWN DISPOSABLE) ×2 IMPLANT
GOWN STRL REUS W/TWL LRG LVL3 (GOWN DISPOSABLE) ×12
GOWN STRL REUS W/TWL XL LVL3 (GOWN DISPOSABLE) ×3
HEMOSTAT POWDER SURGIFOAM 1G (HEMOSTASIS) ×9 IMPLANT
HEMOSTAT SURGICEL 2X14 (HEMOSTASIS) ×3 IMPLANT
KIT BASIN OR (CUSTOM PROCEDURE TRAY) ×3 IMPLANT
KIT CATH CPB BARTLE (MISCELLANEOUS) ×3 IMPLANT
KIT SUCTION CATH 14FR (SUCTIONS) ×3 IMPLANT
KIT SUT CK MINI COMBO 4X17 (Prosthesis & Implant Heart) ×3 IMPLANT
KIT TURNOVER KIT B (KITS) ×3 IMPLANT
KIT VASOVIEW HEMOPRO 2 VH 4000 (KITS) ×3 IMPLANT
LINE VENT (MISCELLANEOUS) ×3 IMPLANT
NS IRRIG 1000ML POUR BTL (IV SOLUTION) ×18 IMPLANT
PACK E OPEN HEART (SUTURE) ×3 IMPLANT
PACK OPEN HEART (CUSTOM PROCEDURE TRAY) ×3 IMPLANT
PAD ARMBOARD 7.5X6 YLW CONV (MISCELLANEOUS) ×6 IMPLANT
PAD ELECT DEFIB RADIOL ZOLL (MISCELLANEOUS) ×3 IMPLANT
PENCIL BUTTON HOLSTER BLD 10FT (ELECTRODE) ×3 IMPLANT
POSITIONER HEAD DONUT 9IN (MISCELLANEOUS) ×3 IMPLANT
PUNCH AORTIC ROTATE 4.0MM (MISCELLANEOUS) IMPLANT
PUNCH AORTIC ROTATE 4.5MM 8IN (MISCELLANEOUS) ×3 IMPLANT
PUNCH AORTIC ROTATE 5MM 8IN (MISCELLANEOUS) IMPLANT
SENSOR MYOCARDIAL TEMP (MISCELLANEOUS) ×3 IMPLANT
SET MPS 3-ND DEL (MISCELLANEOUS) ×3 IMPLANT
SPONGE INTESTINAL PEANUT (DISPOSABLE) IMPLANT
SPONGE T-LAP 18X18 ~~LOC~~+RFID (SPONGE) IMPLANT
SPONGE T-LAP 4X18 ~~LOC~~+RFID (SPONGE) ×3 IMPLANT
SUPPORT HEART JANKE-BARRON (MISCELLANEOUS) ×3 IMPLANT
SUT BONE WAX W31G (SUTURE) ×3 IMPLANT
SUT EB EXC GRN/WHT 2-0 V-5 (SUTURE) ×12 IMPLANT
SUT ETHIBON EXCEL 2-0 V-5 (SUTURE) ×3 IMPLANT
SUT ETHIBOND V-5 VALVE (SUTURE) ×9 IMPLANT
SUT MNCRL AB 4-0 PS2 18 (SUTURE) IMPLANT
SUT PROLENE 3 0 SH DA (SUTURE) IMPLANT
SUT PROLENE 3 0 SH1 36 (SUTURE) ×3 IMPLANT
SUT PROLENE 4 0 RB 1 (SUTURE) ×12
SUT PROLENE 4 0 SH DA (SUTURE) IMPLANT
SUT PROLENE 4-0 RB1 .5 CRCL 36 (SUTURE) ×8 IMPLANT
SUT PROLENE 5 0 C 1 36 (SUTURE) IMPLANT
SUT PROLENE 6 0 C 1 30 (SUTURE) IMPLANT
SUT PROLENE 7 0 BV 1 (SUTURE) IMPLANT
SUT PROLENE 7 0 BV1 MDA (SUTURE) ×6 IMPLANT
SUT PROLENE 8 0 BV175 6 (SUTURE) ×6 IMPLANT
SUT SILK  1 MH (SUTURE)
SUT SILK 1 MH (SUTURE) IMPLANT
SUT SILK 2 0 SH (SUTURE) IMPLANT
SUT STEEL 6MS V (SUTURE) ×6 IMPLANT
SUT STEEL STERNAL CCS#1 18IN (SUTURE) IMPLANT
SUT STEEL SZ 6 DBL 3X14 BALL (SUTURE) IMPLANT
SUT VIC AB 1 CTX 36 (SUTURE) ×6
SUT VIC AB 1 CTX36XBRD ANBCTR (SUTURE) ×4 IMPLANT
SUT VIC AB 2-0 CT1 27 (SUTURE) ×3
SUT VIC AB 2-0 CT1 TAPERPNT 27 (SUTURE) ×2 IMPLANT
SUT VIC AB 2-0 CTX 27 (SUTURE) IMPLANT
SUT VIC AB 3-0 SH 27 (SUTURE)
SUT VIC AB 3-0 SH 27X BRD (SUTURE) IMPLANT
SUT VIC AB 3-0 X1 27 (SUTURE) ×3 IMPLANT
SUT VICRYL 4-0 PS2 18IN ABS (SUTURE) IMPLANT
SYSTEM SAHARA CHEST DRAIN ATS (WOUND CARE) ×3 IMPLANT
TAPE CLOTH SURG 4X10 WHT LF (GAUZE/BANDAGES/DRESSINGS) ×6 IMPLANT
TAPE PAPER 2X10 WHT MICROPORE (GAUZE/BANDAGES/DRESSINGS) ×3 IMPLANT
TOWEL GREEN STERILE (TOWEL DISPOSABLE) ×3 IMPLANT
TOWEL GREEN STERILE FF (TOWEL DISPOSABLE) ×3 IMPLANT
TRAY FOLEY SLVR 16FR TEMP STAT (SET/KITS/TRAYS/PACK) ×3 IMPLANT
TUBE SUCTION CARDIAC 10FR (CANNULA) ×3 IMPLANT
TUBING LAP HI FLOW INSUFFLATIO (TUBING) ×3 IMPLANT
UNDERPAD 30X36 HEAVY ABSORB (UNDERPADS AND DIAPERS) ×3 IMPLANT
VALVE AORTIC SZ25 INSP/RESIL (Prosthesis & Implant Heart) ×3 IMPLANT
VENT LEFT HEART 12002 (CATHETERS) ×3
WATER STERILE IRR 1000ML POUR (IV SOLUTION) ×6 IMPLANT

## 2020-10-14 NOTE — Anesthesia Preprocedure Evaluation (Addendum)
Anesthesia Evaluation  Patient identified by MRN, date of birth, ID band Patient awake    Reviewed: Allergy & Precautions, NPO status , Patient's Chart, lab work & pertinent test results  Airway Mallampati: I  TM Distance: >3 FB Neck ROM: Full    Dental  (+) Teeth Intact, Dental Advisory Given, Chipped,    Pulmonary Current Smoker,    breath sounds clear to auscultation       Cardiovascular hypertension, + CAD   Rhythm:Regular Rate:Normal     Neuro/Psych PSYCHIATRIC DISORDERS Depression CVA    GI/Hepatic negative GI ROS, Neg liver ROS,   Endo/Other  diabetes  Renal/GU negative Renal ROS     Musculoskeletal negative musculoskeletal ROS (+)   Abdominal Normal abdominal exam  (+)   Peds  Hematology negative hematology ROS (+)   Anesthesia Other Findings   Reproductive/Obstetrics                            Anesthesia Physical Anesthesia Plan  ASA: 4  Anesthesia Plan: General   Post-op Pain Management:    Induction: Intravenous  PONV Risk Score and Plan: 2 and Ondansetron and Midazolam  Airway Management Planned: Oral ETT  Additional Equipment: Arterial line, CVP, PA Cath and Ultrasound Guidance Line Placement  Intra-op Plan:   Post-operative Plan: Post-operative intubation/ventilation  Informed Consent: I have reviewed the patients History and Physical, chart, labs and discussed the procedure including the risks, benefits and alternatives for the proposed anesthesia with the patient or authorized representative who has indicated his/her understanding and acceptance.     Dental advisory given  Plan Discussed with: CRNA  Anesthesia Plan Comments: (Echo: 1. Left ventricular ejection fraction, by estimation, is 60 to 65%. The  left ventricle has normal function. There is moderate left ventricular  hypertrophy.  2. Right ventricular systolic function is normal. The right  ventricular  size is normal.  3. No left atrial/left atrial appendage thrombus was detected.  4. The mitral valve is grossly normal. Mild to moderate mitral valve  regurgitation.  5. There was no mass seen on the Tricuspid valve.  6. The aortic valve is calcified. Aortic valve regurgitation is mild.  Moderate to severe aortic valve stenosis. )       Anesthesia Quick Evaluation

## 2020-10-14 NOTE — Anesthesia Postprocedure Evaluation (Signed)
Anesthesia Post Note  Patient: Vincent Guerrero  Procedure(s) Performed: AORTIC VALVE REPLACEMENT (AVR) USING INSPIRIS RESILIA AORTIC VALVE (Chest) CORONARY ARTERY BYPASS GRAFTING (CABG) TIMES FOUR, USING LEFT INTERNAL MAMMARY ARTERY AND RIGHT GREATER SAPHENOUS VEIN HARVESTED ENDOSCOPICALLY (Chest) TRANSESOPHAGEAL ECHOCARDIOGRAM (TEE)     Patient location during evaluation: SICU Anesthesia Type: General Level of consciousness: sedated Pain management: pain level controlled Vital Signs Assessment: post-procedure vital signs reviewed and stable Respiratory status: patient remains intubated per anesthesia plan Cardiovascular status: stable Postop Assessment: no apparent nausea or vomiting Anesthetic complications: no   No notable events documented.  Last Vitals:  Vitals:   10/14/20 1545 10/14/20 1600  BP: (!) 142/100 (!) 140/102  Pulse:  86  Resp: 12 12  Temp: 36.6 C 36.6 C  SpO2:  100%    Last Pain:  Vitals:   10/14/20 1600  TempSrc: Core  PainSc: 0-No pain                 Shelton Silvas

## 2020-10-14 NOTE — Progress Notes (Signed)
  Echocardiogram Echocardiogram Transesophageal has been performed.  Delcie Roch 10/14/2020, 10:12 AM

## 2020-10-14 NOTE — Anesthesia Procedure Notes (Signed)
Arterial Line Insertion Start/End9/10/2020 7:00 AM, 10/14/2020 7:05 AM Performed by: Carlos American, CRNA, CRNA  Patient location: Pre-op. Preanesthetic checklist: patient identified, IV checked, site marked, risks and benefits discussed, surgical consent, monitors and equipment checked, pre-op evaluation, timeout performed and anesthesia consent Lidocaine 1% used for infiltration Left, radial was placed Catheter size: 20 G Hand hygiene performed  and maximum sterile barriers used   Attempts: 1 Procedure performed without using ultrasound guided technique. Following insertion, dressing applied and Biopatch. Post procedure assessment: normal and unchanged  Patient tolerated the procedure well with no immediate complications.

## 2020-10-14 NOTE — Brief Op Note (Signed)
10/14/2020  9:14 AM  PATIENT:  Vincent Guerrero  60 y.o. male  PRE-OPERATIVE DIAGNOSIS:  Aortic Stenosis Coronary Artery Disease  POST-OPERATIVE DIAGNOSIS:  Aortic Stenosis Coronary Artery Disease  PROCEDURE:  Procedure(s): AORTIC VALVE REPLACEMENT (AVR) USING INSPIRIS RESILIA AORTIC VALVE (N/A) CORONARY ARTERY BYPASS GRAFTING (CABG) TIMES FOUR, USING LEFT INTERNAL MAMMARY ARTERY AND RIGHT GREATER SAPHENOUS VEIN HARVESTED ENDOSCOPICALLY (N/A) TRANSESOPHAGEAL ECHOCARDIOGRAM (TEE) (N/A) LIMA-LAD SEQ SVG-OM-DIST CX SVG-RAMUS  SURGEON:  Surgeon(s) and Role:    * Bartle, Payton Doughty, MD - Primary  PHYSICIAN ASSISTANT: Pranathi Winfree PA-C  ASSISTANTS: STAFF   ANESTHESIA:   general  EBL:  780 mL   BLOOD ADMINISTERED:none  DRAINS:  LEFT PLEURAL AND MEDIASTINAL CHEST TUBES    LOCAL MEDICATIONS USED:  NONE  SPECIMEN:  Source of Specimen:  AORTIC VALVE LEAFLETS  DISPOSITION OF SPECIMEN:  PATHOLOGY  COUNTS:  YES  TOURNIQUET:  * No tourniquets in log *  DICTATION: .Dragon Dictation  PLAN OF CARE: Admit to inpatient   PATIENT DISPOSITION:  ICU - intubated and hemodynamically stable.   Delay start of Pharmacological VTE agent (>24hrs) due to surgical blood loss or risk of bleeding: yes  COMPLICATIONS: NO KNOWN

## 2020-10-14 NOTE — Anesthesia Procedure Notes (Signed)
Central Venous Catheter Insertion Performed by: Shelton Silvas, MD, anesthesiologist Start/End9/10/2020 7:00 AM, 10/14/2020 7:05 AM Patient location: Pre-op. Preanesthetic checklist: patient identified, IV checked, site marked, risks and benefits discussed, surgical consent, monitors and equipment checked, pre-op evaluation, timeout performed and anesthesia consent Hand hygiene performed  and maximum sterile barriers used  PA cath was placed.Swan type:thermodilution Procedure performed without using ultrasound guided technique. Attempts: 1 Patient tolerated the procedure well with no immediate complications.

## 2020-10-14 NOTE — Progress Notes (Addendum)
[address]       [place] [address]             [phone]      Day of Surgery  Procedure(s) (LRB): AORTIC VALVE REPLACEMENT (AVR) USING INSPIRIS RESILIA AORTIC VALVE (N/A) CORONARY ARTERY BYPASS GRAFTING (CABG) TIMES FOUR, USING LEFT INTERNAL MAMMARY ARTERY AND RIGHT GREATER SAPHENOUS VEIN HARVESTED ENDOSCOPICALLY (N/A) TRANSESOPHAGEAL ECHOCARDIOGRAM (TEE) (N/A)   Total Length of Stay:  LOS: 0 days    SUBJECTIVE:  Vitals:   10/14/20 1545 10/14/20 1600  BP: (!) 142/100 (!) 140/102  Pulse:  86  Resp: 12 12  Temp: 97.9 F (36.6 C) 97.9 F (36.6 C)  SpO2:  100%    Intake/Output      09/08 0701 09/09 0700 09/09 0701 09/10 0700   I.V. (mL/kg)  1651.5 (18.7)   Blood  442   IV Piggyback  858.4   Total Intake(mL/kg)  2951.9 (33.4)   Urine (mL/kg/hr)  965 (1.2)   Emesis/NG output  60   Blood  780   Chest Tube  140   Total Output  1945   Net  +1006.9            sodium chloride     [START ON 10/15/2020] sodium chloride     sodium chloride     albumin human 12.5 g (10/14/20 1503)    ceFAZolin (ANCEF) IV     dexmedetomidine (PRECEDEX) IV infusion 0.6 mcg/kg/hr (10/14/20 1600)   insulin 3.4 Units/hr (10/14/20 1600)   lactated ringers     lactated ringers 20 mL/hr at 10/14/20 1524   lactated ringers     magnesium sulfate 20 mL/hr at 10/14/20 1600   nitroGLYCERIN 0 mcg/min (10/14/20 1430)   phenylephrine (NEO-SYNEPHRINE) Adult infusion Stopped (10/14/20 1520)   potassium chloride 50 mL/hr at 10/14/20 1600   vancomycin      CBC    Component Value Date/Time   WBC 10.4 10/14/2020 1427   RBC 3.25 (L) 10/14/2020 1427   HGB 8.7 (L) 10/14/2020 1427   HGB 10.9 (L) 08/18/2020 1439   HCT 27.0 (L) 10/14/2020 1427   HCT 34.6 (L) 08/18/2020 1439   PLT 120 (L) 10/14/2020 1427   PLT 213 08/18/2020 1439   MCV 83.1 10/14/2020 1427   MCV 76 (L) 08/18/2020 1439   MCH 26.8 10/14/2020 1427   MCHC 32.2 10/14/2020 1427   RDW 20.0 (H) 10/14/2020  1427   RDW 28.9 (H) 08/18/2020 1439   LYMPHSABS 2.8 08/29/2020 1343   LYMPHSABS 2.9 08/18/2020 1439   MONOABS 0.9 08/29/2020 1343   EOSABS 0.1 08/29/2020 1343   EOSABS 0.1 08/18/2020 1439   BASOSABS 0.0 08/29/2020 1343   BASOSABS 0.0 08/18/2020 1439   CMP     Component Value Date/Time   NA 143 10/14/2020 1323   NA 140 09/01/2020 0000   K 3.0 (L) 10/14/2020 1323   CL 107 10/14/2020 1320   CO2 20 (L) 10/12/2020 1459   GLUCOSE 148 (H) 10/14/2020 1320   BUN 13 10/14/2020 1320   BUN 23 (A) 09/01/2020 0000   CREATININE 0.70 10/14/2020 1320   CALCIUM 9.2 10/12/2020 1459   PROT 6.5 10/12/2020 1459   ALBUMIN 3.6 10/12/2020 1459   AST 17 10/12/2020 1459   ALT 11 10/12/2020 1459   ALKPHOS 59 10/12/2020 1459   BILITOT 0.4 10/12/2020 1459   GFRNONAA >60 10/12/2020 1459   GFRAA 96 11/24/2019 1557   ABG  Component Value Date/Time   PHART 7.371 10/14/2020 1323   PCO2ART 43.9 10/14/2020 1323   PO2ART 257 (H) 10/14/2020 1323   HCO3 25.4 10/14/2020 1323   TCO2 27 10/14/2020 1323   ACIDBASEDEF 1.0 10/14/2020 0819   O2SAT 100.0 10/14/2020 1323   CBG (last 3)  Recent Labs    10/14/20 0610 10/14/20 1456 10/14/20 1531  GLUCAP 130* 92 136*     ASSESSMENT:  CABG x 4/AVR 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 wean to extubate as tolerated CT output 70 cc since leaving OR, good U/O  Shizuko Wojdyla, PA-C 10/14/20 4:17 PM

## 2020-10-14 NOTE — Anesthesia Procedure Notes (Signed)
Procedure Name: Intubation Date/Time: 10/14/2020 7:55 AM Performed by: Thelma Comp, CRNA Pre-anesthesia Checklist: Patient identified, Emergency Drugs available, Suction available and Patient being monitored Patient Re-evaluated:Patient Re-evaluated prior to induction Oxygen Delivery Method: Circle System Utilized Preoxygenation: Pre-oxygenation with 100% oxygen Induction Type: IV induction and Cricoid Pressure applied Ventilation: Mask ventilation without difficulty Laryngoscope Size: Mac and 4 Grade View: Grade II Tube type: Oral Tube size: 8.0 mm Number of attempts: 1 Airway Equipment and Method: Stylet Placement Confirmation: ETT inserted through vocal cords under direct vision, positive ETCO2 and breath sounds checked- equal and bilateral Secured at: 21 cm Tube secured with: Tape Dental Injury: Teeth and Oropharynx as per pre-operative assessment

## 2020-10-14 NOTE — Transfer of Care (Signed)
Immediate Anesthesia Transfer of Care Note  Patient: Vincent Guerrero  Procedure(s) Performed: AORTIC VALVE REPLACEMENT (AVR) USING INSPIRIS RESILIA AORTIC VALVE (Chest) CORONARY ARTERY BYPASS GRAFTING (CABG) TIMES FOUR, USING LEFT INTERNAL MAMMARY ARTERY AND RIGHT GREATER SAPHENOUS VEIN HARVESTED ENDOSCOPICALLY (Chest) TRANSESOPHAGEAL ECHOCARDIOGRAM (TEE)  Patient Location: SICU  Anesthesia Type:General  Level of Consciousness: Patient remains intubated per anesthesia plan  Airway & Oxygen Therapy: Patient remains intubated per anesthesia plan and Patient placed on Ventilator (see vital sign flow sheet for setting)  Post-op Assessment: Report given to RN and Post -op Vital signs reviewed and stable  Post vital signs: Reviewed and stable  Last Vitals:  Vitals Value Taken Time  BP 110/59 10/14/20 1433  Temp 36.2 C 10/14/20 1440  Pulse 91 10/14/20 1440  Resp 12 10/14/20 1440  SpO2 100 % 10/14/20 1440  Vitals shown include unvalidated device data.  Last Pain:  Vitals:   10/14/20 0630  TempSrc:   PainSc: 0-No pain         Complications: No notable events documented.

## 2020-10-14 NOTE — Hospital Course (Addendum)
Reason for consultation:  Severe aortic stenosis and moderate aortic insufficiency   HPI:   The patient is a 60 year old gentleman with history of diabetes, hypertension, hyperlipidemia, ongoing 1 pack/day smoking, severe open angle glaucoma, and coronary artery disease status post MI in 2010 treated with PCI and a stent and moderate aortic stenosis.  His last cardiac catheterization was in April 2018 which showed total occlusion of a nondominant right coronary artery that was previously stented.  There was 30% distal left main stenosis.  There is 40% proximal diffuse LAD narrowing and 60% ostial to proximal ramus stenosis.  The aortic valve peak to peak gradient at that time was 18 mmHg.  Left ventricular systolic function was normal.  He was treated medically.  He had a 2D echo on 10/14/2019 which was felt to show moderate aortic stenosis with a mean gradient of 38 mmHg and peak gradient of 71 mmHg.  There was moderate aortic insufficiency with a pressure half-time of 436 ms.  Left ventricular ejection fraction was 60 to 65% with moderate LVH.  He now presents with at least a several month history of exertional fatigue and shortness of breath.  He has had occasional episodes of chest tightness.  He reports positional dizziness when standing up.  He has had some ankle and pedal edema.  He denies PND and orthopnea.  He had a repeat 2D echo on 06/07/2020 showing calcified aortic valve with thickened leaflets with restricted mobility.  The mean gradient across aortic valve was 43 mmHg with a peak gradient of 71 mmHg.  Pressure half-time was 609 ms consistent with mild to moderate regurgitation.  There was a question of a 1.8 x 1.5 cm mass on the posterior leaflet tricuspid valve.  The mitral valve was unremarkable.  Left ventricular ejection fraction was 55 to 60% with severe concentric LVH and grade 3 diastolic dysfunction.  He subsequently underwent TEE to better examine the tricuspid valve on 08/30/2020.  This  showed an ejection fraction of 60 to 65%.  There was no mass seen on the tricuspid valve.  Aortic valve was calcified with what was felt to be moderate to severe aortic stenosis and mild aortic insufficiency.   He has a history of GI bleeding in the past with anemia.  He underwent EGD and colonoscopy with colonoscopy being negative and EGD showing some nonbleeding gastric ulcers.  He is currently seeing Dr. Bertis Ruddy for treatment of recurrent severe iron deficiency anemia.  He was apparently in the emergency room at [facility] in June 2022 due to some chest discomfort.  His troponin was negative.  His pain was felt to be musculoskeletal.  The patient also tells me that he recently had a tooth extracted and was told by his dentist that he had a few teeth and cavities that need to be treated.  Patient and all studies were reviewed by Dr. Laneta Simmers who recommended proceeding with surgical intervention to include aortic valve replacement as well as coronary artery bypass grafting.  He was admitted this hospitalization for the procedure.  Hospital course:  The patient was admitted and on 10/14/2020 taken to the operating room at which time he underwent CABG x4 and aortic valve replacement.  He tolerated procedure well and was taken to the surgical intensive care unit in stable condition.  The patient was extubated overnight without difficulty.  He was hypertensive and his lopressor dose was increased.  He was also restarted on his home regimen of Diovan.  His chest tubes and lines  were removed without difficulty.  He had post operative Atrial Fibrillation when he was removed from cardiopulmonary bypass.  He has been maintaining NSR since that time and he will be converted to an oral regimen of Amiodarone.  He was started on Lasix for volume overloaded state.  The patient again developed Atrial Fibrillation and was placed back on Amiodarone drip.  His magnesium level was low and he was supplemented with IV replacement.  His  foley catheter was removed without difficulty.    He converted to sinus rhythm and was transitioned from IV Amiodarone to oral on 09/12. He was volume overloaded and diuresed accordingly.  On postoperative day #4 he remained hemodynamically stable in sinus rhythm on p.o. amiodarone and Lopressor.  His pacing wires were removed.  He continues to diurese well and diuretics have now been stopped.  His blood sugars remain under good control and he is stable for transfer to 4 E. when bed is available.

## 2020-10-14 NOTE — Procedures (Signed)
Extubation Procedure Note  Patient Details:   Name: Vincent Guerrero DOB: April 16, 1960 MRN: [id number]   Airway Documentation:    Vent end date: 10/14/20 Vent end time: 1808   Evaluation  O2 sats: stable throughout Complications: No apparent complications Patient did tolerate procedure well. Bilateral Breath Sounds: Clear, Diminished   Yes  Patient was extubated to a 4L [place] without any complications, dyspnea or stridor noted. NIF: -25, VC: 1L, positive cuff leak prior to extubation. Patient was instructed on IS but demonstrated very poor effort.   Carlynn Spry 10/14/2020, 6:08 PM

## 2020-10-14 NOTE — Op Note (Signed)
CARDIOVASCULAR SURGERY OPERATIVE NOTE  10/14/2020  Surgeon:  Alleen Borne, MD  First Assistant: Gershon Crane,  PA-C   Preoperative Diagnosis:  Severe multi-vessel coronary artery disease, severe aortic stenosis and moderate aortic insufficiency.   Postoperative Diagnosis:  Same   Procedure:  Median Sternotomy Extracorporeal circulation 3.   Coronary artery bypass grafting x 4  Left internal mammary artery graft to the LAD SVG to Ramus Sequential SVG to OM and distal LCX  4.   Endoscopic vein harvest from the right leg 5.   Aortic valve replacement using a 25 mm Edwards INSPIRIS RESILIA pericardial valve.   Anesthesia:  General Endotracheal   Clinical History/Surgical Indication:  This 60 year old gentleman has stage D, severe, symptomatic aortic stenosis and moderate aortic insufficiency with New York Heart Association class II symptoms of exertional fatigue and shortness of breath consistent with chronic diastolic congestive heart failure.  He also has a history of coronary artery disease with an inferior MI in 2010 treated with a stent and subsequent known occlusion of the right coronary artery by catheterization in 2018.  I have personally reviewed his 2D echocardiogram, TEE and cardiac cath.  His echocardiogram shows a calcified aortic valve with restricted leaflet mobility.  The mean gradient is 43 mmHg consistent with severe aortic stenosis.  The pressure half-time was 609 ms suggesting mild to moderate aortic insufficiency although visually looks moderate to me.  He has at least moderate concentric LVH with normal left ventricular systolic function.  Cath shows multivessel coronary artery disease. I agree that aortic valve replacement and CABG is indicated to relieve his symptoms and prevent progressive left ventricular deterioration.  Given his age and multivessel CAD I think open  surgical aortic valve replacement and CABG is the best treatment for him.  I discussed the alternatives of mechanical and bioprosthetic valves.  I recommend a bioprosthetic valve given his history of anemia and GI bleeding and his age of almost 60 years old.  He is in agreement with that.  I reviewed his echo, TEE, and cath images with him and answered all of his questions.  I stressed the importance of smoking cessation preoperatively and preventing complications such as pneumonia and sternal nonhealing as well as long-term risks of smoking.  I discussed the operative procedure with the patient and family including alternatives, benefits and risks; including but not limited to bleeding, blood transfusion, infection, stroke, myocardial infarction, graft failure, heart block requiring a permanent pacemaker, organ dysfunction, and death.  Vincent Guerrero understands and agrees to proceed.    Preparation:  The patient was seen in the preoperative holding area and the correct patient, correct operation were confirmed with the patient after reviewing the medical record and catheterization. The consent was signed by me. Preoperative antibiotics were given. A pulmonary arterial line and radial arterial line were placed by the anesthesia team. The patient was taken back to the operating room and positioned supine on the operating room table. After being placed under general endotracheal anesthesia by the anesthesia team a foley catheter was placed. The neck, chest, abdomen, and both legs were prepped with betadine soap and solution and draped in the usual sterile manner. A surgical time-out was taken and the correct patient and operative procedure were confirmed with the nursing and anesthesia staff.  TEE: performed by Dr. Demetrius Charity: This showed severe AS, moderate AI, moderate to severe LVH with normal LVEF, mild MR.   Cardiopulmonary Bypass:  A median sternotomy was performed. The pericardium was  opened in  the midline. Right ventricular function appeared normal. The ascending aorta was of normal size and had no palpable plaque. There were no contraindications to aortic cannulation or cross-clamping. The patient was fully systemically heparinized and the ACT was maintained > 400 sec. The proximal aortic arch was cannulated with a 20 F aortic cannula for arterial inflow. Venous cannulation was performed via the right atrial appendage using a two-staged venous cannula. An antegrade cardioplegia/vent cannula was inserted into the mid-ascending aorta. A retrograde cardioplegia cannula was placed in the coronary sinus via the right atrium. A left ventricular vent was placed via the right superior pulmonary vein. Aortic occlusion was performed with a single cross-clamp. Systemic cooling to 32 degrees Centigrade and topical cooling of the heart with iced saline were used. Retrograde cold KBC cardioplegia was used to induce diastolic arrest since antegrade cardioplegia would not fill the aorta due to the AI. It was then given at about 60 minute intervals throughout the period of arrest to maintain myocardial temperature at or below 10 degrees centigrade. A temperature probe was inserted into the interventricular septum and an insulating pad was placed in the pericardium.   Left internal mammary artery harvest:  The left side of the sternum was retracted using the Rultract retractor. The left internal mammary artery was harvested as a pedicle graft. All side branches were clipped. It was a medium-sized vessel of good quality with excellent blood flow. It was ligated distally and divided. It was sprayed with topical papaverine solution to prevent vasospasm.   Endoscopic vein harvest:  The right greater saphenous vein was harvested endoscopically through a 2 cm incision medial to the right knee. It was harvested from the upper thigh to below the knee. It was a medium-sized vein of good quality. The side branches were  all ligated with 4-0 silk ties.    Coronary arteries:  The coronary arteries were examined.  LAD:  Large vessel with diffuse segmental plaque. LCX:  Large Ramus with diffuse plaque. OM was medium caliber with no distal disease. The distal LCX (PDA) was a medium caliber vessel with no distal disease. RCA:  small  and non-dominant.   Grafts:  LIMA to the LAD: 3.0 mm. It was sewn end to side using 8-0 prolene continuous suture. SVG to Ramus:  2.5 mm. It was sewn end to side using 7-0 prolene continuous suture. Sequential SVG to OM:  1.6 mm. It was sewn sequential side to side using 7-0 prolene continuous suture. Sequential SVG to distal LCX (PDA):  1.6 mm. It was sewn sequential end to side using 7-0 prolene continuous suture.  The proximal vein graft anastomoses were performed to the mid-ascending aorta using continuous 6-0 prolene suture. Graft markers were placed around the proximal anastomoses.  Aortic Valve Replacement:   A transverse aortotomy was performed 1 cm above the take-off of the right coronary artery. The native valve was tricuspid with thickened and calcified leaflets and mild annular calcification. The ostia of the coronary arteries were in normal position and were not obstructed. The native valve leaflets were excised and the annulus was decalcified with rongeurs. Care was taken to remove all particulate debris. The left ventricle was directly inspected for debris and then irrigated with ice saline solution. The annulus was sized and a size 82mm Edwards INSPIRIS RESILIA pericardial valve was chosen. The model number was [id number] and the serial number was [id number].  While the valve was being prepared 2-0 Ethibond pledgeted horizontal mattress sutures were placed  around the annulus with the pledgets in a sub-annular position. The sutures were placed through the sewing ring and the valve lowered into place. The sutures were tied sequentially. The valve seated nicely and the coronary  ostia were not obstructed. The prosthetic valve leaflets moved normally and there was no sub-valvular obstruction. The aortotomy was closed using 4-0 Prolene suture in 2 layers with felt strips to reinforce the closure.   Completion:  The patient was rewarmed to 37 degrees Centigrade. He was given a dose of warm reanimation cardioplegia per protocol. The clamp was removed from the LIMA pedicle and there was rapid warming of the septum and return of ventricular fibrillation. De-airing maneuvers were performed and the head was placed in Trendelenburg position. The crossclamp was removed with a time of 156 minutes. There was spontaneous return of sinus rhythm. The distal and proximal anastomoses were checked for hemostasis. The position of the grafts was satisfactory. Two temporary epicardial pacing wires were placed on the right atrium and two on the right ventricle. The patient was weaned from CPB without difficulty on no inotropes. CPB time was 183 minutes. Cardiac output was 5 LPM. TEE showed a normally functioning aortic valve prosthesis with a mean gradient of 7 mm Hg and a peak of 14 mm Hg. There was no paravalvular leak. LVEF remained normal with mild MR. Heparin was fully reversed with protamine and the aortic and venous cannulas removed. Hemostasis was achieved. Mediastinal and left pleural drainage tubes were placed. The sternum was closed with  #6 stainless steel wires. The fascia was closed with continuous # 1 vicryl suture. The subcutaneous tissue was closed with 2-0 vicryl continuous suture. The skin was closed with 3-0 vicryl subcuticular suture. All sponge, needle, and instrument counts were reported correct at the end of the case. Dry sterile dressings were placed over the incisions and around the chest tubes which were connected to pleurevac suction. The patient was then transported to the surgical intensive care unit in stable condition.

## 2020-10-14 NOTE — Interval H&P Note (Signed)
History and Physical Interval Note:  10/14/2020 6:28 AM  Vincent Guerrero  has presented today for surgery, with the diagnosis of AS CAD.  The various methods of treatment have been discussed with the patient and family. After consideration of risks, benefits and other options for treatment, the patient has consented to  Procedure(s): AORTIC VALVE REPLACEMENT (AVR) (N/A) CORONARY ARTERY BYPASS GRAFTING (CABG) (N/A) TRANSESOPHAGEAL ECHOCARDIOGRAM (TEE) (N/A) as a surgical intervention.  The patient's history has been reviewed, patient examined, no change in status, stable for surgery.  I have reviewed the patient's chart and labs.  Questions were answered to the patient's satisfaction.     Alleen Borne

## 2020-10-14 NOTE — Progress Notes (Signed)
Patient ID: Vincent Guerrero, male   DOB: [birth year], 60 y.o.   MRN: [id number]  TCTS Evening Rounds:   Hemodynamically stable, sinus rhythm on IV amio. CI = 2.7  Extubated.  Urine output good  CT output low  CBC    Component Value Date/Time   WBC 10.4 10/14/2020 1427   RBC 3.25 (L) 10/14/2020 1427   HGB 9.2 (L) 10/14/2020 1913   HGB 10.9 (L) 08/18/2020 1439   HCT 27.0 (L) 10/14/2020 1913   HCT 34.6 (L) 08/18/2020 1439   PLT 120 (L) 10/14/2020 1427   PLT 213 08/18/2020 1439   MCV 83.1 10/14/2020 1427   MCV 76 (L) 08/18/2020 1439   MCH 26.8 10/14/2020 1427   MCHC 32.2 10/14/2020 1427   RDW 20.0 (H) 10/14/2020 1427   RDW 28.9 (H) 08/18/2020 1439   LYMPHSABS 2.8 08/29/2020 1343   LYMPHSABS 2.9 08/18/2020 1439   MONOABS 0.9 08/29/2020 1343   EOSABS 0.1 08/29/2020 1343   EOSABS 0.1 08/18/2020 1439   BASOSABS 0.0 08/29/2020 1343   BASOSABS 0.0 08/18/2020 1439     BMET    Component Value Date/Time   NA 143 10/14/2020 1913   NA 140 09/01/2020 0000   K 4.3 10/14/2020 1913   CL 107 10/14/2020 1320   CO2 20 (L) 10/12/2020 1459   GLUCOSE 148 (H) 10/14/2020 1320   BUN 13 10/14/2020 1320   BUN 23 (A) 09/01/2020 0000   CREATININE 0.70 10/14/2020 1320   CALCIUM 9.2 10/12/2020 1459   GFRNONAA >60 10/12/2020 1459   GFRAA 96 11/24/2019 1557     A/P:  Stable postop course. Continue current plans

## 2020-10-14 NOTE — Anesthesia Procedure Notes (Signed)
Central Venous Catheter Insertion Performed by: Shelton Silvas, MD, anesthesiologist Start/End9/10/2020 6:55 AM, 10/14/2020 7:00 AM Patient location: Pre-op. Preanesthetic checklist: patient identified, IV checked, site marked, risks and benefits discussed, surgical consent, monitors and equipment checked, pre-op evaluation, timeout performed and anesthesia consent Position: Trendelenburg Lidocaine 1% used for infiltration and patient sedated Hand hygiene performed , maximum sterile barriers used  and Seldinger technique used Catheter size: 9 Fr Total catheter length 10. Central line was placed.Sheath introducer Swan type:thermodilution PA Cath depth:50 Procedure performed using ultrasound guided technique. Ultrasound Notes:anatomy identified, needle tip was noted to be adjacent to the nerve/plexus identified, no ultrasound evidence of intravascular and/or intraneural injection and image(s) printed for medical record Attempts: 1 Following insertion, line sutured, dressing applied and Biopatch. Post procedure assessment: blood return through all ports, free fluid flow and no air  Patient tolerated the procedure well with no immediate complications.

## 2020-10-15 ENCOUNTER — Inpatient Hospital Stay (HOSPITAL_COMMUNITY): Payer: Medicaid Other

## 2020-10-15 DIAGNOSIS — Z952 Presence of prosthetic heart valve: Secondary | ICD-10-CM

## 2020-10-15 LAB — MAGNESIUM
Magnesium: 2.1 mg/dL (ref 1.7–2.4)
Magnesium: 1.9 mg/dL (ref 1.7–2.4)

## 2020-10-15 LAB — CBC
HCT: 26 % — ABNORMAL LOW (ref 39.0–52.0)
HCT: 27.2 % — ABNORMAL LOW (ref 39.0–52.0)
Hemoglobin: 8.3 g/dL — ABNORMAL LOW (ref 13.0–17.0)
Hemoglobin: 8.5 g/dL — ABNORMAL LOW (ref 13.0–17.0)
MCH: 26.3 pg (ref 26.0–34.0)
MCH: 25.8 pg — ABNORMAL LOW (ref 26.0–34.0)
MCHC: 31.9 g/dL (ref 30.0–36.0)
MCHC: 31.3 g/dL (ref 30.0–36.0)
MCV: 82.3 fL (ref 80.0–100.0)
MCV: 82.4 fL (ref 80.0–100.0)
Platelets: 132 10*3/uL — ABNORMAL LOW (ref 150–400)
Platelets: 137 10*3/uL — ABNORMAL LOW (ref 150–400)
RBC: 3.16 MIL/uL — ABNORMAL LOW (ref 4.22–5.81)
RBC: 3.3 MIL/uL — ABNORMAL LOW (ref 4.22–5.81)
RDW: 20.1 % — ABNORMAL HIGH (ref 11.5–15.5)
RDW: 20.3 % — ABNORMAL HIGH (ref 11.5–15.5)
WBC: 13.6 10*3/uL — ABNORMAL HIGH (ref 4.0–10.5)
WBC: 19.2 10*3/uL — ABNORMAL HIGH (ref 4.0–10.5)
nRBC: 0 % (ref 0.0–0.2)
nRBC: 0 % (ref 0.0–0.2)

## 2020-10-15 LAB — GLUCOSE, CAPILLARY
Glucose-Capillary: 110 mg/dL — ABNORMAL HIGH (ref 70–99)
Glucose-Capillary: 113 mg/dL — ABNORMAL HIGH (ref 70–99)
Glucose-Capillary: 114 mg/dL — ABNORMAL HIGH (ref 70–99)
Glucose-Capillary: 119 mg/dL — ABNORMAL HIGH (ref 70–99)
Glucose-Capillary: 125 mg/dL — ABNORMAL HIGH (ref 70–99)
Glucose-Capillary: 125 mg/dL — ABNORMAL HIGH (ref 70–99)
Glucose-Capillary: 136 mg/dL — ABNORMAL HIGH (ref 70–99)
Glucose-Capillary: 160 mg/dL — ABNORMAL HIGH (ref 70–99)
Glucose-Capillary: 177 mg/dL — ABNORMAL HIGH (ref 70–99)
Glucose-Capillary: 188 mg/dL — ABNORMAL HIGH (ref 70–99)

## 2020-10-15 LAB — BASIC METABOLIC PANEL
Anion gap: 6 (ref 5–15)
Anion gap: 6 (ref 5–15)
BUN: 12 mg/dL (ref 6–20)
BUN: 15 mg/dL (ref 6–20)
CO2: 23 mmol/L (ref 22–32)
CO2: 25 mmol/L (ref 22–32)
Calcium: 8 mg/dL — ABNORMAL LOW (ref 8.9–10.3)
Calcium: 8.3 mg/dL — ABNORMAL LOW (ref 8.9–10.3)
Chloride: 108 mmol/L (ref 98–111)
Chloride: 106 mmol/L (ref 98–111)
Creatinine, Ser: 0.9 mg/dL (ref 0.61–1.24)
Creatinine, Ser: 1.01 mg/dL (ref 0.61–1.24)
GFR, Estimated: >60 mL/min (ref >60)
GFR, Estimated: >60 mL/min (ref >60)
Glucose, Bld: 110 mg/dL — ABNORMAL HIGH (ref 70–99)
Glucose, Bld: 174 mg/dL — ABNORMAL HIGH (ref 70–99)
Potassium: 4.1 mmol/L (ref 3.5–5.1)
Potassium: 4.3 mmol/L (ref 3.5–5.1)
Sodium: 137 mmol/L (ref 135–145)
Sodium: 137 mmol/L (ref 135–145)

## 2020-10-15 LAB — HEMOGLOBIN AND HEMATOCRIT, BLOOD
HCT: 26.4 % — ABNORMAL LOW (ref 39.0–52.0)
Hemoglobin: 8.5 g/dL — ABNORMAL LOW (ref 13.0–17.0)

## 2020-10-15 MED ORDER — AMIODARONE HCL 200 MG PO TABS
200.0000 mg | ORAL_TABLET | Freq: Two times a day (BID) | ORAL | Status: DC
  Administered 2020-10-15: 200 mg via ORAL
  Filled 2020-10-15: qty 1

## 2020-10-15 MED ORDER — FE FUMARATE-B12-VIT C-FA-IFC PO CAPS
1.0000 | ORAL_CAPSULE | Freq: Two times a day (BID) | ORAL | Status: DC
  Administered 2020-10-15: 1 via ORAL
  Filled 2020-10-15 (×6): qty 1

## 2020-10-15 MED ORDER — INSULIN ASPART 100 UNIT/ML IJ SOLN
0.0000 [IU] | INTRAMUSCULAR | Status: DC
  Administered 2020-10-15: 2 [IU] via SUBCUTANEOUS
  Administered 2020-10-15: 4 [IU] via SUBCUTANEOUS
  Administered 2020-10-15: 2 [IU] via SUBCUTANEOUS
  Administered 2020-10-16 (×2): 4 [IU] via SUBCUTANEOUS
  Administered 2020-10-16: 2 [IU] via SUBCUTANEOUS

## 2020-10-15 MED ORDER — IRBESARTAN 75 MG PO TABS
37.5000 mg | ORAL_TABLET | Freq: Every day | ORAL | Status: DC
  Administered 2020-10-15 – 2020-10-19 (×5): 37.5 mg via ORAL
  Filled 2020-10-15 (×5): qty 0.5

## 2020-10-15 MED ORDER — AMIODARONE HCL IN DEXTROSE 360-4.14 MG/200ML-% IV SOLN
30.0000 mg/h | INTRAVENOUS | Status: DC
  Administered 2020-10-15 – 2020-10-17 (×4): 30 mg/h via INTRAVENOUS
  Filled 2020-10-15 (×4): qty 200

## 2020-10-15 MED ORDER — METOPROLOL TARTRATE 25 MG/10 ML ORAL SUSPENSION: 25.0000 mg | Freq: Two times a day (BID) | ORAL | Status: DC

## 2020-10-15 MED ORDER — METOPROLOL TARTRATE 50 MG PO TABS
50.0000 mg | ORAL_TABLET | Freq: Two times a day (BID) | ORAL | Status: DC
  Administered 2020-10-15 – 2020-10-17 (×4): 50 mg via ORAL
  Filled 2020-10-15 (×4): qty 1

## 2020-10-15 MED ORDER — METOPROLOL TARTRATE 25 MG PO TABS
25.0000 mg | ORAL_TABLET | Freq: Two times a day (BID) | ORAL | Status: DC
  Administered 2020-10-15: 25 mg via ORAL
  Filled 2020-10-15: qty 1

## 2020-10-15 MED ORDER — AMIODARONE IV BOLUS ONLY 150 MG/100ML
150.0000 mg | Freq: Once | INTRAVENOUS | Status: AC
  Administered 2020-10-15: 150 mg via INTRAVENOUS
  Filled 2020-10-15: qty 100

## 2020-10-15 MED ORDER — INSULIN DETEMIR 100 UNIT/ML ~~LOC~~ SOLN
20.0000 [IU] | Freq: Every day | SUBCUTANEOUS | Status: DC
  Filled 2020-10-15: qty 0.2

## 2020-10-15 MED ORDER — POTASSIUM CHLORIDE CRYS ER 20 MEQ PO TBCR
20.0000 meq | EXTENDED_RELEASE_TABLET | Freq: Two times a day (BID) | ORAL | Status: AC
  Administered 2020-10-15 (×2): 20 meq via ORAL
  Filled 2020-10-15 (×2): qty 1

## 2020-10-15 MED ORDER — METOPROLOL TARTRATE 25 MG/10 ML ORAL SUSPENSION: 50.0000 mg | Freq: Two times a day (BID) | ORAL | Status: DC

## 2020-10-15 MED ORDER — AMIODARONE HCL IN DEXTROSE 360-4.14 MG/200ML-% IV SOLN
INTRAVENOUS | Status: AC
  Filled 2020-10-15: qty 200

## 2020-10-15 MED ORDER — INSULIN DETEMIR 100 UNIT/ML ~~LOC~~ SOLN
20.0000 [IU] | Freq: Every day | SUBCUTANEOUS | Status: DC
  Administered 2020-10-15 – 2020-10-19 (×5): 20 [IU] via SUBCUTANEOUS
  Filled 2020-10-15 (×5): qty 0.2

## 2020-10-15 MED ORDER — ENOXAPARIN SODIUM 40 MG/0.4ML IJ SOSY
40.0000 mg | PREFILLED_SYRINGE | Freq: Every day | INTRAMUSCULAR | Status: DC
  Administered 2020-10-15: 40 mg via SUBCUTANEOUS
  Filled 2020-10-15: qty 0.4

## 2020-10-15 MED ORDER — FUROSEMIDE 10 MG/ML IJ SOLN
40.0000 mg | Freq: Two times a day (BID) | INTRAMUSCULAR | Status: AC
  Administered 2020-10-15 (×2): 40 mg via INTRAVENOUS
  Filled 2020-10-15 (×2): qty 4

## 2020-10-15 NOTE — Plan of Care (Signed)
  Problem: Education: Goal: Knowledge of General Education information will improve Description: Including pain rating scale, medication(s)/side effects and non-pharmacologic comfort measures Outcome: Progressing   Problem: Health Behavior/Discharge Planning: Goal: Ability to manage health-related needs will improve Outcome: Progressing   Problem: Clinical Measurements: Goal: Ability to maintain clinical measurements within normal limits will improve Outcome: Progressing Goal: Will remain free from infection Outcome: Progressing Goal: Diagnostic test results will improve Outcome: Progressing Goal: Respiratory complications will improve Outcome: Progressing Goal: Cardiovascular complication will be avoided Outcome: Progressing   Problem: Activity: Goal: Risk for activity intolerance will decrease Outcome: Progressing   Problem: Nutrition: Goal: Adequate nutrition will be maintained Outcome: Progressing Note: Poor appetite at this time   Problem: Coping: Goal: Level of anxiety will decrease Outcome: Progressing   Problem: Elimination: Goal: Will not experience complications related to bowel motility Outcome: Progressing Note: No BM yet, but passing some gas, bowel sounds auscultated, no abdominal discomfort reported   Problem: Pain Managment: Goal: General experience of comfort will improve Outcome: Progressing   Problem: Safety: Goal: Ability to remain free from injury will improve Outcome: Progressing   Problem: Skin Integrity: Goal: Risk for impaired skin integrity will decrease Outcome: Progressing

## 2020-10-15 NOTE — Progress Notes (Signed)
Patient ID: Vincent Guerrero, male   DOB: September 24, 1960, 60 y.o.   MRN: [id number] TCTS Evening Rounds:  Hemodynamically stable but went back into atrial fib with RVR this afternoon. Gave him a bolus of amio and restarting drip at 30. BP is good so will increase Lopressor to 50 bid.  Up in chair.  Urine output ok.  BMET    Component Value Date/Time   NA 137 10/15/2020 1700   NA 140 09/01/2020 0000   K 4.3 10/15/2020 1700   CL 106 10/15/2020 1700   CO2 25 10/15/2020 1700   GLUCOSE 174 (H) 10/15/2020 1700   BUN 15 10/15/2020 1700   BUN 23 (A) 09/01/2020 0000   CREATININE 1.01 10/15/2020 1700   CALCIUM 8.3 (L) 10/15/2020 1700   GFRNONAA >60 10/15/2020 1700   GFRAA 96 11/24/2019 1557   CBC    Component Value Date/Time   WBC 19.2 (H) 10/15/2020 1700   RBC 3.30 (L) 10/15/2020 1700   HGB 8.5 (L) 10/15/2020 1700   HGB 10.9 (L) 08/18/2020 1439   HCT 27.2 (L) 10/15/2020 1700   HCT 34.6 (L) 08/18/2020 1439   PLT 137 (L) 10/15/2020 1700   PLT 213 08/18/2020 1439   MCV 82.4 10/15/2020 1700   MCV 76 (L) 08/18/2020 1439   MCH 25.8 (L) 10/15/2020 1700   MCHC 31.3 10/15/2020 1700   RDW 20.3 (H) 10/15/2020 1700   RDW 28.9 (H) 08/18/2020 1439   LYMPHSABS 2.8 08/29/2020 1343   LYMPHSABS 2.9 08/18/2020 1439   MONOABS 0.9 08/29/2020 1343   EOSABS 0.1 08/29/2020 1343   EOSABS 0.1 08/18/2020 1439   BASOSABS 0.0 08/29/2020 1343   BASOSABS 0.0 08/18/2020 1439

## 2020-10-15 NOTE — Discharge Summary (Signed)
[address]       [place] [address]             [phone]    Physician Discharge Summary  Patient ID: Vincent Guerrero MRN: [id number] DOB/AGE: 1960-08-31 60 y.o.  Admit date: 10/14/2020 Discharge date: 10/20/2020  Admission Diagnoses:  Patient Active Problem List   Diagnosis Date Noted   Encounter to establish care with new doctor 08/24/2020   Severe aortic stenosis by prior echocardiography 08/24/2020   Nonrheumatic aortic valve insufficiency 08/24/2020   Vitamin D deficiency 07/06/2020   Gastritis 07/06/2020   Shortness of breath 06/02/2020   Wheezing 06/02/2020   Iron deficiency anemia due to chronic blood loss 11/27/2019   Deficiency anemia 11/25/2019   Chest pressure 11/06/2019   Tobacco use 11/06/2019   Tobacco abuse 11/05/2019   Anemia    Inguinal hernia without obstruction or gangrene    Hypertension    Diabetes mellitus (HCC)    Depression    Age-related nuclear cataract of both eyes 11/27/2018   Cortical age-related cataract of both eyes 11/27/2018   Primary open angle glaucoma (POAG) of left eye, severe stage 11/27/2018   Primary open angle glaucoma (POAG) of both eyes, severe stage 11/27/2018   Coronary artery disease involving native heart without angina pectoris    Hyperlipidemia LDL goal <70    Bilateral recurrent inguinal hernia without obstruction or gangrene 05/09/2016    Discharge Diagnoses:  Patient Active Problem List   Diagnosis Date Noted   S/P AVR 10/15/2020   S/P CABG x 4 10/14/2020   Encounter to establish care with new doctor 08/24/2020   Severe aortic stenosis by prior echocardiography 08/24/2020   Nonrheumatic aortic valve insufficiency 08/24/2020   Vitamin D deficiency 07/06/2020   Gastritis 07/06/2020   Shortness of breath 06/02/2020   Wheezing 06/02/2020   Iron deficiency anemia due to chronic blood loss 11/27/2019   Deficiency anemia 11/25/2019   Chest pressure 11/06/2019   Tobacco use 11/06/2019    Tobacco abuse 11/05/2019   Anemia    Inguinal hernia without obstruction or gangrene    Hypertension    Diabetes mellitus (HCC)    Depression    Age-related nuclear cataract of both eyes 11/27/2018   Cortical age-related cataract of both eyes 11/27/2018   Primary open angle glaucoma (POAG) of left eye, severe stage 11/27/2018   Primary open angle glaucoma (POAG) of both eyes, severe stage 11/27/2018   Coronary artery disease involving native heart without angina pectoris    Hyperlipidemia LDL goal <70    Bilateral recurrent inguinal hernia without obstruction or gangrene 05/09/2016   Stroke (HCC) 05/2016     Discharged Condition: good  HPI:   The patient is a 60 year old gentleman with history of diabetes, hypertension, hyperlipidemia, ongoing 1 pack/day smoking, severe open angle glaucoma, and coronary artery disease status post MI in 2010 treated with PCI and a stent and moderate aortic stenosis.  His last cardiac catheterization was in April 2018 which showed total occlusion of a nondominant right coronary artery that was previously stented.  There was 30% distal left main stenosis.  There is 40% proximal diffuse LAD narrowing and 60% ostial to proximal ramus stenosis.  The aortic valve peak to peak gradient at that time was 18 mmHg.  Left ventricular systolic function was normal.  He was treated medically.  He had a 2D echo on 10/14/2019 which was felt to show moderate aortic stenosis with a mean gradient of  38 mmHg and peak gradient of 71 mmHg.  There was moderate aortic insufficiency with a pressure half-time of 436 ms.  Left ventricular ejection fraction was 60 to 65% with moderate LVH.  He now presents with at least a several month history of exertional fatigue and shortness of breath.  He has had occasional episodes of chest tightness.  He reports positional dizziness when standing up.  He has had some ankle and pedal edema.  He denies PND and orthopnea.  He had a repeat 2D echo on  06/07/2020 showing calcified aortic valve with thickened leaflets with restricted mobility.  The mean gradient across aortic valve was 43 mmHg with a peak gradient of 71 mmHg.  Pressure half-time was 609 ms consistent with mild to moderate regurgitation.  There was a question of a 1.8 x 1.5 cm mass on the posterior leaflet tricuspid valve.  The mitral valve was unremarkable.  Left ventricular ejection fraction was 55 to 60% with severe concentric LVH and grade 3 diastolic dysfunction.  He subsequently underwent TEE to better examine the tricuspid valve on 08/30/2020.  This showed an ejection fraction of 60 to 65%.  There was no mass seen on the tricuspid valve.  Aortic valve was calcified with what was felt to be moderate to severe aortic stenosis and mild aortic insufficiency.   He has a history of GI bleeding in the past with anemia.  He underwent EGD and colonoscopy with colonoscopy being negative and EGD showing some nonbleeding gastric ulcers.  He is currently seeing Dr. Bertis Ruddy for treatment of recurrent severe iron deficiency anemia.  He was apparently in the emergency room at [facility] in June 2022 due to some chest discomfort.  His troponin was negative.  His pain was felt to be musculoskeletal.  The patient also tells me that he recently had a tooth extracted and was told by his dentist that he had a few teeth and cavities that need to be treated.   Patient and all studies were reviewed by Dr. Laneta Simmers who recommended proceeding with surgical intervention to include aortic valve replacement as well as coronary artery bypass grafting.  He was admitted this hospitalization for the procedure.  Hospital course:  The patient was admitted and on 10/14/2020 taken to the operating room at which time he underwent CABG x4 and aortic valve replacement.  He tolerated procedure well and was taken to the surgical intensive care unit in stable condition.  The patient was extubated overnight without difficulty.  He was  hypertensive and his lopressor dose was increased.  He was also restarted on his home regimen of Diovan.  His chest tubes and lines were removed without difficulty.  He had post operative Atrial Fibrillation when he was removed from cardiopulmonary bypass.  He has been maintaining NSR since that time and he will be converted to an oral regimen of Amiodarone.  He was started on Lasix for volume overloaded state.  The patient again developed Atrial Fibrillation and was placed back on Amiodarone drip.  His magnesium level was low and he was supplemented with IV replacement.  His foley catheter was removed without difficulty.    He converted to sinus rhythm and was transitioned from IV Amiodarone to oral on 09/12. He was volume overloaded and diuresed accordingly.  He continued to make excellent progress in cardiac rehab.  Oxygen was weaned and he maintains good saturations on room air.  Incisions were noted to be healing well without evidence of infection.  He was tolerating  diet.  He was maintaining sinus rhythm and at the time of discharge she was felt to be quite stable.    Consults: None  Significant Diagnostic Studies: cardiac graphics:   Echocardiogram:    IMPRESSIONS     1. Left ventricular ejection fraction, by estimation, is 60 to 65%. The  left ventricle has normal function. There is moderate left ventricular  hypertrophy.   2. Right ventricular systolic function is normal. The right ventricular  size is normal.   3. No left atrial/left atrial appendage thrombus was detected.   4. The mitral valve is grossly normal. Mild to moderate mitral valve  regurgitation.   5. There was no mass seen on the Tricuspid valve.   6. The aortic valve is calcified. Aortic valve regurgitation is mild.  Moderate to severe aortic valve stenosis.    Angiography:     Ost LAD to Mid LAD lesion is 65% stenosed.   Mid LM to Ost LAD lesion is 35% stenosed with 70% stenosed side branch in Ramus.   1st LPL  lesion is 99% stenosed.   Ost RCA to Prox RCA lesion is 100% stenosed.   1st Mrg lesion is 70% stenosed.   LV end diastolic pressure is normal.   There is moderate aortic valve stenosis.   SUMMARY Multivessel CAD:  Known CTO of nondominant RCA, and now CTO of LPL 1 along  70% ostial stenosis of Ramus Intermedius and 1st Mrg Angiographically mild to moderate disease in the proximal LAD, however, by IVUS 2 tandem lesions of 65% stenosis. Angiographically concerning distal left main lesion that is new.  By IVUS appears to be a potential healed dissection. Normal Right Heart Cath Pressures: PA mean 18 mmHg with a Wedge pressure of 9 mm.  LVEDP of 11 mmHg. Moderate aortic stenosis by pressure measurement: Mean gradient 27 mmHg.    Treatments: surgery:   10/14/2020   Surgeon:  Alleen Borne, MD   First Assistant: Gershon Crane,  PA-C     Preoperative Diagnosis:  Severe multi-vessel coronary artery disease, severe aortic stenosis and moderate aortic insufficiency.     Postoperative Diagnosis:  Same     Procedure:   Median Sternotomy Extracorporeal circulation 3.   Coronary artery bypass grafting x 4   Left internal mammary artery graft to the LAD SVG to Ramus Sequential SVG to OM and distal LCX   4.   Endoscopic vein harvest from the right leg 5.   Aortic valve replacement using a 25 mm Edwards INSPIRIS RESILIA pericardial valve.   Discharge Exam: Blood pressure (!) 149/80, pulse 88, temperature 98.5 F (36.9 C), temperature source Oral, resp. rate 17, height 5\' 11"  (1.803 m), weight 87.5 kg, SpO2 91 %.   General appearance: alert, cooperative, and no distress Heart: regular rate and rhythm Lungs: clear to auscultation bilaterally Abdomen: benign Extremities: no edema Wound: echymosis from evh right thigh, no hematoma   Discharge Medications:  The patient has been discharged on:   1.Beta Blocker:  Yes [  y ]                              No   [   ]                               If No, reason:  2.Ace Inhibitor/ARB: Yes [  y ]  No  [    ]                                     If No, reason:  3.Statin:   Yes [ y  ]                  No  [   ]                  If No, reason:  4.Ecasa:  Yes  [ y  ]                  No   [   ]                  If No, reason:  Patient had ACS upon admission:  Plavix/P2Y12 inhibitor: Yes [   ]                                      No  [   ]     Discharge Instructions     Amb Referral to Cardiac Rehabilitation   Complete by: As directed    Refer to [facility]   Diagnosis:  CABG Valve Replacement     Valve: Aortic   CABG X ___: 4   After initial evaluation and assessments completed: Virtual Based Care may be provided alone or in conjunction with Phase 2 Cardiac Rehab based on patient barriers.: Yes   Discharge patient   Complete by: As directed    Discharge disposition: 01-Home or Self Care   Discharge patient date: 10/19/2020      Allergies as of 10/19/2020       Reactions   Cetacaine [butamben-tetracaine-benzocaine] Shortness Of Breath, Other (See Comments)   Intense laryngospasm        Medication List     STOP taking these medications    aspirin 325 MG tablet Replaced by: aspirin 325 MG EC tablet   BC HEADACHE PO   isosorbide mononitrate 30 MG 24 hr tablet Commonly known as: IMDUR   magnesium oxide 400 (240 Mg) MG tablet Commonly known as: MAG-OX   nitroGLYCERIN 0.4 MG SL tablet Commonly known as: NITROSTAT   ranolazine 500 MG 12 hr tablet Commonly known as: RANEXA       TAKE these medications    acetaminophen 500 MG tablet Commonly known as: TYLENOL Take 1,000 mg by mouth every 6 (six) hours as needed for headache or mild pain (when not taking BC powders).   amiodarone 400 MG tablet Commonly known as: PACERONE Take 1 tablet (400 mg total) by mouth 2 (two) times daily. For 7 days, then once daily   aspirin 325 MG EC  tablet Take 1 tablet (325 mg total) by mouth daily. Replaces: aspirin 325 MG tablet   atorvastatin 80 MG tablet Commonly known as: LIPITOR Take 1 tablet (80 mg total) by mouth daily.   cloNIDine 0.1 MG tablet Commonly known as: CATAPRES Take 0.1 mg by mouth See admin instructions. Take 0.1 mg by mouth one to three times a day as needed for a Systolic reading of 180 or greater   ferrous fumarate-b12-vitamic C-folic acid capsule Commonly known as: TRINSICON / FOLTRIN Take 1 capsule by mouth 2 (two) times daily after a meal.  glipiZIDE 10 MG tablet Commonly known as: GLUCOTROL Take 10 mg by mouth 2 (two) times daily before a meal.   latanoprost 0.005 % ophthalmic solution Commonly known as: XALATAN Place 1 drop into the right eye at bedtime.   metFORMIN 1000 MG tablet Commonly known as: GLUCOPHAGE Take 1,000 mg by mouth 2 (two) times daily with a meal.   metoprolol tartrate 50 MG tablet Commonly known as: LOPRESSOR Take 1 tablet (50 mg total) by mouth 2 (two) times daily.   NICODERM CQ TD Place 1 patch onto the skin daily as needed (for smoking cessation).   oxyCODONE 5 MG immediate release tablet Commonly known as: Oxy IR/ROXICODONE Take 1 tablet (5 mg total) by mouth every 6 (six) hours as needed for severe pain.   pantoprazole 40 MG tablet Commonly known as: PROTONIX Take 1 tablet (40 mg total) by mouth daily. What changed: when to take this   Pataday 0.2 % Soln Generic drug: Olopatadine HCl Place 1 drop into the right eye 2 (two) times daily.   Spiriva Respimat 2.5 MCG/ACT Aers Generic drug: Tiotropium Bromide Monohydrate Inhale 1 puff into the lungs 2 (two) times daily.   valsartan 40 MG tablet Commonly known as: Diovan Take 1 tablet (40 mg total) by mouth daily. What changed: when to take this        Follow-up Information     Alleen BorneBartle, Bryan K, MD Follow up.   Specialty: Cardiothoracic Surgery Why: Please see discharge paperwork for follow-up  appointments with your surgeon and nurse for suture removal.  On the date you see surgeon [facility] 1/2-hour prior to appointment.  It is located in the same office complex on the first floor. Contact information: [address] [place] [address] [phone]         Thomasene Rippleobb, Kardie, DO Follow up.   Specialty: Cardiology Why: Please see discharge paperwork for follow-up appointment with cardiology.  Also obtain a MYCHART ACCOUNT where you can track all of your follow-up appointments with your [facility] physicians. Contact information: [address] [place] [address] [phone]                 Signed: Rowe ClackWayne E Osamu Olguin PA-C 10/20/2020, 8:45 AM

## 2020-10-15 NOTE — Progress Notes (Signed)
1 Day Post-Op Procedure(s) (LRB): AORTIC VALVE REPLACEMENT (AVR) USING INSPIRIS RESILIA AORTIC VALVE (N/A) CORONARY ARTERY BYPASS GRAFTING (CABG) TIMES FOUR, USING LEFT INTERNAL MAMMARY ARTERY AND RIGHT GREATER SAPHENOUS VEIN HARVESTED ENDOSCOPICALLY (N/A) TRANSESOPHAGEAL ECHOCARDIOGRAM (TEE) (N/A) Subjective: Complains of chest wall pain but otherwise ok  Objective: Vital signs in last 24 hours: Temp:  [97.2 F (36.2 C)-100.4 F (38 C)] 98.8 F (37.1 C) (09/10 0700) Pulse Rate:  [78-93] 79 (09/10 0700) Cardiac Rhythm: Normal sinus rhythm (09/10 0400) Resp:  [12-30] 19 (09/10 0700) BP: (96-142)/(59-102) 102/68 (09/10 0500) SpO2:  [94 %-100 %] 95 % (09/10 0700) Arterial Line BP: (88-154)/(41-79) 138/61 (09/10 0700) FiO2 (%):  [40 %-50 %] 40 % (09/09 1735) Weight:  [93.8 kg] 93.8 kg (09/10 0500)  Hemodynamic parameters for last 24 hours: PAP: (13-45)/(1-20) 45/19 CO:  [4.2 L/min-5.7 L/min] 5.5 L/min CI:  [2 L/min/m2-2.7 L/min/m2] 2.6 L/min/m2  Intake/Output from previous day: 09/09 0701 - 09/10 0700 In: 4689.1 [P.O.:240; I.V.:2109.4; Blood:442; IV Piggyback:1897.8] Out: 2885 [Urine:1555; Emesis/NG output:60; Blood:780; Chest Tube:490] Intake/Output this shift: No intake/output data recorded.  General appearance: alert and cooperative Neurologic: intact Heart: regular rate and rhythm, S1, S2 normal, no murmur Lungs: clear to auscultation bilaterally Extremities: edema mild Wound: dressing dry  Lab Results: Recent Labs    10/14/20 2006 10/15/20 0359  WBC 14.5* 13.6*  HGB 8.5* 8.3*  HCT 26.4* 26.0*  PLT 133* 132*   BMET:  Recent Labs    10/14/20 2006 10/15/20 0359  NA 138 137  K 4.2 4.1  CL 111 108  CO2 23 23  GLUCOSE 104* 110*  BUN 12 12  CREATININE 0.92 0.90  CALCIUM 7.9* 8.0*    PT/INR:  Recent Labs    10/14/20 1427  LABPROT 16.1*  INR 1.3*   ABG    Component Value Date/Time   PHART 7.311 (L) 10/14/2020 1913   HCO3 22.2 10/14/2020 1913    TCO2 23 10/14/2020 1913   ACIDBASEDEF 4.0 (H) 10/14/2020 1913   O2SAT 96.0 10/14/2020 1913   CBG (last 3)  Recent Labs    10/15/20 0155 10/15/20 0359 10/15/20 0544  GLUCAP 113* 110* 136*   CXR: clear  ECG: sinus, no acute changes  Assessment/Plan: S/P Procedure(s) (LRB): AORTIC VALVE REPLACEMENT (AVR) USING INSPIRIS RESILIA AORTIC VALVE (N/A) CORONARY ARTERY BYPASS GRAFTING (CABG) TIMES FOUR, USING LEFT INTERNAL MAMMARY ARTERY AND RIGHT GREATER SAPHENOUS VEIN HARVESTED ENDOSCOPICALLY (N/A) TRANSESOPHAGEAL ECHOCARDIOGRAM (TEE) (N/A)  POD 1 Hemodynamically stable in sinus rhythm. Hypertensive this am. Continue Lopressor and increase to 25 bid. Resume Diovan equiv (Avapro).  Early postop atrial fib coming off bypass. Convert IV amio to po 200 bid.  Dangle again and remove chest tubes later today.  DC swan and arterial line.  Volume excess: wt is 12 lbs up. Start diuresis.  IS, OOB.  DM: preop Hgb A1c was 6.3 on Metformin. Start Levemir and SSI, DC insulin drip.   LOS: 1 day    Alleen Borne 10/15/2020

## 2020-10-16 ENCOUNTER — Inpatient Hospital Stay (HOSPITAL_COMMUNITY): Payer: Medicaid Other

## 2020-10-16 LAB — CBC
HCT: 26.3 % — ABNORMAL LOW (ref 39.0–52.0)
Hemoglobin: 8.5 g/dL — ABNORMAL LOW (ref 13.0–17.0)
MCH: 26.4 pg (ref 26.0–34.0)
MCHC: 32.3 g/dL (ref 30.0–36.0)
MCV: 81.7 fL (ref 80.0–100.0)
Platelets: 143 10*3/uL — ABNORMAL LOW (ref 150–400)
RBC: 3.22 MIL/uL — ABNORMAL LOW (ref 4.22–5.81)
RDW: 20.3 % — ABNORMAL HIGH (ref 11.5–15.5)
WBC: 22.5 10*3/uL — ABNORMAL HIGH (ref 4.0–10.5)
nRBC: 0 % (ref 0.0–0.2)

## 2020-10-16 LAB — BASIC METABOLIC PANEL
Anion gap: 6 (ref 5–15)
BUN: 18 mg/dL (ref 6–20)
CO2: 25 mmol/L (ref 22–32)
Calcium: 8.2 mg/dL — ABNORMAL LOW (ref 8.9–10.3)
Chloride: 104 mmol/L (ref 98–111)
Creatinine, Ser: 1.04 mg/dL (ref 0.61–1.24)
GFR, Estimated: >60 mL/min (ref >60)
Glucose, Bld: 141 mg/dL — ABNORMAL HIGH (ref 70–99)
Potassium: 4.2 mmol/L (ref 3.5–5.1)
Sodium: 135 mmol/L (ref 135–145)

## 2020-10-16 LAB — COMPREHENSIVE METABOLIC PANEL
ALT: 16 U/L (ref 0–44)
AST: 48 U/L — ABNORMAL HIGH (ref 15–41)
Albumin: 3.2 g/dL — ABNORMAL LOW (ref 3.5–5.0)
Alkaline Phosphatase: 49 U/L (ref 38–126)
Anion gap: 8 (ref 5–15)
BUN: 23 mg/dL — ABNORMAL HIGH (ref 6–20)
CO2: 24 mmol/L (ref 22–32)
Calcium: 8.2 mg/dL — ABNORMAL LOW (ref 8.9–10.3)
Chloride: 101 mmol/L (ref 98–111)
Creatinine, Ser: 1.1 mg/dL (ref 0.61–1.24)
GFR, Estimated: >60 mL/min (ref >60)
Glucose, Bld: 162 mg/dL — ABNORMAL HIGH (ref 70–99)
Potassium: 4.4 mmol/L (ref 3.5–5.1)
Sodium: 133 mmol/L — ABNORMAL LOW (ref 135–145)
Total Bilirubin: 0.4 mg/dL (ref 0.3–1.2)
Total Protein: 5.9 g/dL — ABNORMAL LOW (ref 6.5–8.1)

## 2020-10-16 LAB — GLUCOSE, CAPILLARY
Glucose-Capillary: 147 mg/dL — ABNORMAL HIGH (ref 70–99)
Glucose-Capillary: 167 mg/dL — ABNORMAL HIGH (ref 70–99)
Glucose-Capillary: 162 mg/dL — ABNORMAL HIGH (ref 70–99)
Glucose-Capillary: 126 mg/dL — ABNORMAL HIGH (ref 70–99)
Glucose-Capillary: 142 mg/dL — ABNORMAL HIGH (ref 70–99)

## 2020-10-16 MED ORDER — METOLAZONE 2.5 MG PO TABS
2.5000 mg | ORAL_TABLET | Freq: Once | ORAL | Status: AC
  Administered 2020-10-16: 2.5 mg via ORAL
  Filled 2020-10-16: qty 1

## 2020-10-16 MED ORDER — INSULIN ASPART 100 UNIT/ML IJ SOLN
0.0000 [IU] | Freq: Three times a day (TID) | INTRAMUSCULAR | Status: DC
  Administered 2020-10-16: 2 [IU] via SUBCUTANEOUS
  Administered 2020-10-16: 4 [IU] via SUBCUTANEOUS
  Administered 2020-10-17: 2 [IU] via SUBCUTANEOUS
  Administered 2020-10-17: 4 [IU] via SUBCUTANEOUS

## 2020-10-16 MED ORDER — UMECLIDINIUM BROMIDE 62.5 MCG/INH IN AEPB
1.0000 | INHALATION_SPRAY | Freq: Every day | RESPIRATORY_TRACT | Status: DC
  Administered 2020-10-17 – 2020-10-19 (×3): 1 via RESPIRATORY_TRACT
  Filled 2020-10-16: qty 7

## 2020-10-16 MED ORDER — MAGNESIUM SULFATE 2 GM/50ML IV SOLN
2.0000 g | Freq: Once | INTRAVENOUS | Status: AC
  Administered 2020-10-16: 2 g via INTRAVENOUS
  Filled 2020-10-16: qty 50

## 2020-10-16 MED ORDER — FUROSEMIDE 10 MG/ML IJ SOLN
40.0000 mg | Freq: Two times a day (BID) | INTRAMUSCULAR | Status: AC
  Administered 2020-10-16 (×2): 40 mg via INTRAVENOUS
  Filled 2020-10-16 (×2): qty 4

## 2020-10-16 NOTE — Progress Notes (Addendum)
      [address]       [place] [address]             [phone]      2 Days Post-Op Procedure(s) (LRB): AORTIC VALVE REPLACEMENT (AVR) USING INSPIRIS RESILIA AORTIC VALVE (N/A) CORONARY ARTERY BYPASS GRAFTING (CABG) TIMES FOUR, USING LEFT INTERNAL MAMMARY ARTERY AND RIGHT GREATER SAPHENOUS VEIN HARVESTED ENDOSCOPICALLY (N/A) TRANSESOPHAGEAL ECHOCARDIOGRAM (TEE) (N/A)  Subjective:  Patient feels tired this morning.  He has already been up and ambulated around the unit.  Objective: Vital signs in last 24 hours: Temp:  [98 F (36.7 C)-98.9 F (37.2 C)] 98.7 F (37.1 C) (09/11 0806) Pulse Rate:  [80-156] 100 (09/11 1004) Cardiac Rhythm: Atrial fibrillation (09/10 2000) Resp:  [14-32] 17 (09/11 0700) BP: (123-178)/(71-105) 134/101 (09/11 1004) SpO2:  [88 %-99 %] 92 % (09/11 0700) Weight:  [92.3 kg] 92.3 kg (09/11 0600)  Intake/Output from previous day: 09/10 0701 - 09/11 0700 In: 1786.6 [P.O.:390; I.V.:1096.7; IV Piggyback:299.9] Out: 1925 [Urine:1865; Chest Tube:60]  General appearance: alert, cooperative, and no distress Heart: irregularly irregular rhythm Lungs: clear to auscultation bilaterally Abdomen: soft, non-tender; bowel sounds normal; no masses,  no organomegaly Extremities: edema trace Wound: clean and dry, ecchymosis RLE  Lab Results: Recent Labs    10/15/20 1700 10/16/20 0403  WBC 19.2* 22.5*  HGB 8.5* 8.5*  HCT 27.2* 26.3*  PLT 137* 143*   BMET:  Recent Labs    10/15/20 1700 10/16/20 0403  NA 137 135  K 4.3 4.2  CL 106 104  CO2 25 25  GLUCOSE 174* 141*  BUN 15 18  CREATININE 1.01 1.04  CALCIUM 8.3* 8.2*    PT/INR:  Recent Labs    10/14/20 1427  LABPROT 16.1*  INR 1.3*   ABG    Component Value Date/Time   PHART 7.311 (L) 10/14/2020 1913   HCO3 22.2 10/14/2020 1913   TCO2 23 10/14/2020 1913   ACIDBASEDEF 4.0 (H) 10/14/2020 1913   O2SAT 96.0 10/14/2020 1913   CBG (last 3)  Recent Labs     10/15/20 2353 10/16/20 0402 10/16/20 0739  GLUCAP 177* 147* 167*    Assessment/Plan: S/P Procedure(s) (LRB): AORTIC VALVE REPLACEMENT (AVR) USING INSPIRIS RESILIA AORTIC VALVE (N/A) CORONARY ARTERY BYPASS GRAFTING (CABG) TIMES FOUR, USING LEFT INTERNAL MAMMARY ARTERY AND RIGHT GREATER SAPHENOUS VEIN HARVESTED ENDOSCOPICALLY (N/A) TRANSESOPHAGEAL ECHOCARDIOGRAM (TEE) (N/A)  CV- Atrial Fibrillation, rate is controlled- continue IV Amiodarone for now, Lopressor, and Avapro Pulm- no acute issues, mild atelectasis on the left, continue IS Renal- creatinine has been WNL, weight is minimally elevated has received 2 dose of Lasix, potassium is normal at 4.2...Marland Kitchen last magnesium level was low at 1.9, will give Magnesium IV D/C Foley Expected post operative blood loss anemia, Hgb stable at 8.5 DM- sugars pretty well controlled, will change SSIP to TID AC/HS Dispo- patient looks great, he remains in A. Fib on Amiodarone drip which we will continue for now, continue IS for mild atelectasis, d/c foley today... patient may be able to go to 4E later today if he converts to NSR   LOS: 2 days    Lowella Dandy, PA-C  10/16/2020   Chart reviewed, patient examined, agree with above. Stable day. He is back in sinus rhythm on IV amio.

## 2020-10-16 NOTE — Plan of Care (Signed)
?  Problem: Education: ?Goal: Will demonstrate proper wound care and an understanding of methods to prevent future damage ?Outcome: Progressing ?Goal: Knowledge of disease or condition will improve ?Outcome: Progressing ?Goal: Knowledge of the prescribed therapeutic regimen will improve ?Outcome: Progressing ?  ?Problem: Activity: ?Goal: Risk for activity intolerance will decrease ?Outcome: Progressing ?  ?Problem: Cardiac: ?Goal: Will achieve and/or maintain hemodynamic stability ?Outcome: Progressing ?  ?Problem: Clinical Measurements: ?Goal: Postoperative complications will be avoided or minimized ?Outcome: Progressing ?  ?Problem: Respiratory: ?Goal: Respiratory status will improve ?Outcome: Progressing ?  ?Problem: Skin Integrity: ?Goal: Wound healing without signs and symptoms of infection ?Outcome: Progressing ?Goal: Risk for impaired skin integrity will decrease ?Outcome: Progressing ?  ?Problem: Urinary Elimination: ?Goal: Ability to achieve and maintain adequate renal perfusion and functioning will improve ?Outcome: Progressing ?  ?

## 2020-10-17 ENCOUNTER — Encounter (HOSPITAL_COMMUNITY): Payer: Self-pay | Admitting: Surgery

## 2020-10-17 LAB — CBC
HCT: 24.2 % — ABNORMAL LOW (ref 39.0–52.0)
Hemoglobin: 8.1 g/dL — ABNORMAL LOW (ref 13.0–17.0)
MCH: 26.7 pg (ref 26.0–34.0)
MCHC: 33.5 g/dL (ref 30.0–36.0)
MCV: 79.9 fL — ABNORMAL LOW (ref 80.0–100.0)
Platelets: 151 10*3/uL (ref 150–400)
RBC: 3.03 MIL/uL — ABNORMAL LOW (ref 4.22–5.81)
RDW: 19.9 % — ABNORMAL HIGH (ref 11.5–15.5)
WBC: 16.5 10*3/uL — ABNORMAL HIGH (ref 4.0–10.5)
nRBC: 0 % (ref 0.0–0.2)

## 2020-10-17 LAB — COMPREHENSIVE METABOLIC PANEL
ALT: 14 U/L (ref 0–44)
AST: 36 U/L (ref 15–41)
Albumin: 2.8 g/dL — ABNORMAL LOW (ref 3.5–5.0)
Alkaline Phosphatase: 47 U/L (ref 38–126)
Anion gap: 10 (ref 5–15)
BUN: 22 mg/dL — ABNORMAL HIGH (ref 6–20)
CO2: 28 mmol/L (ref 22–32)
Calcium: 8.5 mg/dL — ABNORMAL LOW (ref 8.9–10.3)
Chloride: 98 mmol/L (ref 98–111)
Creatinine, Ser: 1.05 mg/dL (ref 0.61–1.24)
GFR, Estimated: >60 mL/min (ref >60)
Glucose, Bld: 92 mg/dL (ref 70–99)
Potassium: 3.5 mmol/L (ref 3.5–5.1)
Sodium: 136 mmol/L (ref 135–145)
Total Bilirubin: 0.7 mg/dL (ref 0.3–1.2)
Total Protein: 5.6 g/dL — ABNORMAL LOW (ref 6.5–8.1)

## 2020-10-17 LAB — GLUCOSE, CAPILLARY
Glucose-Capillary: 90 mg/dL (ref 70–99)
Glucose-Capillary: 182 mg/dL — ABNORMAL HIGH (ref 70–99)
Glucose-Capillary: 159 mg/dL — ABNORMAL HIGH (ref 70–99)
Glucose-Capillary: 125 mg/dL — ABNORMAL HIGH (ref 70–99)

## 2020-10-17 LAB — SURGICAL PATHOLOGY

## 2020-10-17 MED ORDER — ONDANSETRON HCL 4 MG PO TABS: 4.0000 mg | ORAL_TABLET | Freq: Four times a day (QID) | ORAL | Status: DC | PRN

## 2020-10-17 MED ORDER — PANTOPRAZOLE SODIUM 40 MG PO TBEC
40.0000 mg | DELAYED_RELEASE_TABLET | Freq: Every day | ORAL | Status: DC
  Administered 2020-10-18 – 2020-10-19 (×2): 40 mg via ORAL
  Filled 2020-10-17 (×2): qty 1

## 2020-10-17 MED ORDER — SENNOSIDES-DOCUSATE SODIUM 8.6-50 MG PO TABS
2.0000 | ORAL_TABLET | Freq: Every day | ORAL | Status: DC
  Administered 2020-10-17: 2 via ORAL
  Filled 2020-10-17: qty 2

## 2020-10-17 MED ORDER — SODIUM CHLORIDE 0.9% FLUSH: 3.0000 mL | INTRAVENOUS | Status: DC | PRN

## 2020-10-17 MED ORDER — METOPROLOL TARTRATE 50 MG PO TABS
50.0000 mg | ORAL_TABLET | Freq: Two times a day (BID) | ORAL | Status: DC
  Administered 2020-10-17 – 2020-10-19 (×4): 50 mg via ORAL
  Filled 2020-10-17 (×4): qty 1

## 2020-10-17 MED ORDER — ASPIRIN EC 325 MG PO TBEC
325.0000 mg | DELAYED_RELEASE_TABLET | Freq: Every day | ORAL | Status: DC
  Administered 2020-10-18 – 2020-10-19 (×2): 325 mg via ORAL
  Filled 2020-10-17 (×2): qty 1

## 2020-10-17 MED ORDER — TRAMADOL HCL 50 MG PO TABS
50.0000 mg | ORAL_TABLET | ORAL | Status: DC | PRN
  Administered 2020-10-17: 50 mg via ORAL
  Filled 2020-10-17: qty 1

## 2020-10-17 MED ORDER — POTASSIUM CHLORIDE CRYS ER 20 MEQ PO TBCR: 20.0000 meq | EXTENDED_RELEASE_TABLET | Freq: Every day | ORAL | Status: DC

## 2020-10-17 MED ORDER — SORBITOL 70 % SOLN
30.0000 mL | Freq: Once | Status: AC
  Administered 2020-10-17: 30 mL via ORAL
  Filled 2020-10-17: qty 30

## 2020-10-17 MED ORDER — POTASSIUM CHLORIDE CRYS ER 20 MEQ PO TBCR
20.0000 meq | EXTENDED_RELEASE_TABLET | ORAL | Status: AC
  Administered 2020-10-17 (×3): 20 meq via ORAL
  Filled 2020-10-17 (×3): qty 1

## 2020-10-17 MED ORDER — OXYCODONE HCL 5 MG PO TABS
5.0000 mg | ORAL_TABLET | ORAL | Status: DC | PRN
  Administered 2020-10-17: 5 mg via ORAL
  Administered 2020-10-18: 10 mg via ORAL
  Administered 2020-10-18: 5 mg via ORAL
  Administered 2020-10-19: 10 mg via ORAL
  Filled 2020-10-17: qty 2
  Filled 2020-10-17 (×2): qty 1
  Filled 2020-10-17: qty 2

## 2020-10-17 MED ORDER — SODIUM CHLORIDE 0.9 % IV SOLN: 250.0000 mL | INTRAVENOUS | Status: DC | PRN

## 2020-10-17 MED ORDER — ATORVASTATIN CALCIUM 80 MG PO TABS: 80.0000 mg | ORAL_TABLET | Freq: Every day | ORAL | Status: DC

## 2020-10-17 MED ORDER — CHLORHEXIDINE GLUCONATE CLOTH 2 % EX PADS
6.0000 | MEDICATED_PAD | Freq: Every day | CUTANEOUS | Status: DC
  Administered 2020-10-18 – 2020-10-19 (×2): 6 via TOPICAL

## 2020-10-17 MED ORDER — ALBUTEROL SULFATE (2.5 MG/3ML) 0.083% IN NEBU
2.5000 mg | INHALATION_SOLUTION | Freq: Four times a day (QID) | RESPIRATORY_TRACT | Status: DC
  Administered 2020-10-17 – 2020-10-19 (×9): 2.5 mg via RESPIRATORY_TRACT
  Filled 2020-10-17 (×9): qty 3

## 2020-10-17 MED ORDER — GUAIFENESIN ER 600 MG PO TB12
1200.0000 mg | ORAL_TABLET | Freq: Two times a day (BID) | ORAL | Status: DC
  Administered 2020-10-17 – 2020-10-19 (×5): 1200 mg via ORAL
  Filled 2020-10-17 (×5): qty 2

## 2020-10-17 MED ORDER — ASPIRIN EC 325 MG PO TBEC: 325.0000 mg | DELAYED_RELEASE_TABLET | Freq: Every day | ORAL | Status: DC

## 2020-10-17 MED ORDER — LATANOPROST 0.005 % OP SOLN
1.0000 [drp] | Freq: Every day | OPHTHALMIC | Status: DC
  Administered 2020-10-17 – 2020-10-18 (×2): 1 [drp] via OPHTHALMIC
  Filled 2020-10-17: qty 2.5

## 2020-10-17 MED ORDER — ATORVASTATIN CALCIUM 80 MG PO TABS
80.0000 mg | ORAL_TABLET | Freq: Every day | ORAL | Status: DC
  Administered 2020-10-18 – 2020-10-19 (×2): 80 mg via ORAL
  Filled 2020-10-17 (×2): qty 1

## 2020-10-17 MED ORDER — ~~LOC~~ CARDIAC SURGERY, PATIENT & FAMILY EDUCATION
Freq: Once | Status: AC
  Administered 2020-10-17: 1

## 2020-10-17 MED ORDER — ONDANSETRON HCL 4 MG/2ML IJ SOLN: 4.0000 mg | Freq: Four times a day (QID) | INTRAMUSCULAR | Status: DC | PRN

## 2020-10-17 MED ORDER — INSULIN ASPART 100 UNIT/ML IJ SOLN
0.0000 [IU] | Freq: Three times a day (TID) | INTRAMUSCULAR | Status: DC
  Administered 2020-10-17: 2 [IU] via SUBCUTANEOUS
  Administered 2020-10-18: 4 [IU] via SUBCUTANEOUS
  Administered 2020-10-18: 8 [IU] via SUBCUTANEOUS
  Administered 2020-10-19: 2 [IU] via SUBCUTANEOUS

## 2020-10-17 MED ORDER — AMIODARONE HCL 200 MG PO TABS
400.0000 mg | ORAL_TABLET | Freq: Two times a day (BID) | ORAL | Status: DC
  Administered 2020-10-17 – 2020-10-19 (×5): 400 mg via ORAL
  Filled 2020-10-17 (×5): qty 2

## 2020-10-17 MED ORDER — FUROSEMIDE 40 MG PO TABS
40.0000 mg | ORAL_TABLET | Freq: Every day | ORAL | Status: DC
  Administered 2020-10-17: 40 mg via ORAL
  Filled 2020-10-17: qty 1

## 2020-10-17 MED ORDER — SODIUM CHLORIDE 0.9% FLUSH
3.0000 mL | Freq: Two times a day (BID) | INTRAVENOUS | Status: DC
  Administered 2020-10-17 – 2020-10-19 (×4): 3 mL via INTRAVENOUS

## 2020-10-17 MED FILL — Potassium Chloride Inj 2 mEq/ML: INTRAVENOUS | Qty: 40 | Status: AC

## 2020-10-17 MED FILL — Thrombin (Recombinant) For Soln 20000 Unit: CUTANEOUS | Qty: 1 | Status: AC

## 2020-10-17 MED FILL — Heparin Sodium (Porcine) Inj 1000 Unit/ML: INTRAMUSCULAR | Qty: 30 | Status: AC

## 2020-10-17 MED FILL — Lidocaine HCl Local Preservative Free (PF) Inj 2%: INTRAMUSCULAR | Qty: 15 | Status: AC

## 2020-10-17 NOTE — Plan of Care (Signed)
  Problem: Education: Goal: Knowledge of General Education information will improve Description: Including pain rating scale, medication(s)/side effects and non-pharmacologic comfort measures Outcome: Progressing   Problem: Clinical Measurements: Goal: Ability to maintain clinical measurements within normal limits will improve Outcome: Progressing Goal: Will remain free from infection Outcome: Progressing Goal: Diagnostic test results will improve Outcome: Progressing Goal: Cardiovascular complication will be avoided Outcome: Progressing   Problem: Activity: Goal: Risk for activity intolerance will decrease Outcome: Progressing   Problem: Coping: Goal: Level of anxiety will decrease Outcome: Progressing   Problem: Elimination: Goal: Will not experience complications related to bowel motility Outcome: Completed/Met Goal: Will not experience complications related to urinary retention Outcome: Completed/Met   Problem: Pain Managment: Goal: General experience of comfort will improve Outcome: Progressing   Problem: Safety: Goal: Ability to remain free from injury will improve Outcome: Progressing   Problem: Skin Integrity: Goal: Risk for impaired skin integrity will decrease Outcome: Progressing   Problem: Education: Goal: Knowledge of disease or condition will improve Outcome: Progressing   Problem: Cardiac: Goal: Will achieve and/or maintain hemodynamic stability Outcome: Progressing   Problem: Clinical Measurements: Goal: Postoperative complications will be avoided or minimized Outcome: Progressing   Problem: Respiratory: Goal: Respiratory status will improve Outcome: Progressing   Problem: Skin Integrity: Goal: Wound healing without signs and symptoms of infection Outcome: Progressing Goal: Risk for impaired skin integrity will decrease Outcome: Progressing   Problem: Urinary Elimination: Goal: Ability to achieve and maintain adequate renal perfusion and  functioning will improve Outcome: Completed/Met

## 2020-10-17 NOTE — Progress Notes (Signed)
CARDIAC REHAB PHASE I   Returned to offer to walk with pt again. Pt taking a nap, requesting to walk for a third time later. RN aware. Will continue to follow.  Reynold Bowen, RN BSN 10/17/2020 2:50 PM

## 2020-10-17 NOTE — Progress Notes (Signed)
CARDIAC REHAB PHASE I   PRE:  Rate/Rhythm: 78 SR  BP:  Sitting: 128/53      SaO2: 95 RA  MODE:  Ambulation: 370 ft   POST:  Rate/Rhythm: 92 SR  BP:  Sitting: 129/86    SaO2: 91 RA   Pt ambulated 335ft in hallway assist of one with EVA. Pt able to increase distance and maintain conversation throughout the walk. Pt denies CP, SOB, or dizziness. Pt returned to recliner. Encouraged continued ambulation and IS use. Will continue to follow.  [id number] Vincent Bowen, RN BSN 10/17/2020 10:32 AM

## 2020-10-17 NOTE — Progress Notes (Signed)
3 Days Post-Op Procedure(s) (LRB): AORTIC VALVE REPLACEMENT (AVR) USING INSPIRIS RESILIA AORTIC VALVE (N/A) CORONARY ARTERY BYPASS GRAFTING (CABG) TIMES FOUR, USING LEFT INTERNAL MAMMARY ARTERY AND RIGHT GREATER SAPHENOUS VEIN HARVESTED ENDOSCOPICALLY (N/A) TRANSESOPHAGEAL ECHOCARDIOGRAM (TEE) (N/A) Subjective: No complaints.  Objective: Vital signs in last 24 hours: Temp:  [98 F (36.7 C)-99.1 F (37.3 C)] 99.1 F (37.3 C) (09/12 0729) Pulse Rate:  [67-114] 81 (09/12 0700) Cardiac Rhythm: Normal sinus rhythm (09/11 2000) Resp:  [13-31] 20 (09/12 0700) BP: (89-166)/(69-101) 161/93 (09/12 0700) SpO2:  [88 %-96 %] 92 % (09/12 0700) Weight:  [90.7 kg] 90.7 kg (09/12 0635)  Hemodynamic parameters for last 24 hours:    Intake/Output from previous day: 09/11 0701 - 09/12 0700 In: 1089 [P.O.:100; I.V.:939; IV Piggyback:50] Out: 2460 [Urine:2460] Intake/Output this shift: No intake/output data recorded.  General appearance: alert and cooperative Neurologic: intact Heart: regular rate and rhythm, S1, S2 normal, no murmur, click, rub or gallop Lungs: rhonchi bilaterally Extremities: edema mild Wound: incision ok  Lab Results: Recent Labs    10/16/20 0403 10/17/20 0510  WBC 22.5* 16.5*  HGB 8.5* 8.1*  HCT 26.3* 24.2*  PLT 143* 151   BMET:  Recent Labs    10/16/20 1226 10/17/20 0510  NA 133* 136  K 4.4 3.5  CL 101 98  CO2 24 28  GLUCOSE 162* 92  BUN 23* 22*  CREATININE 1.10 1.05  CALCIUM 8.2* 8.5*    PT/INR:  Recent Labs    10/14/20 1427  LABPROT 16.1*  INR 1.3*   ABG    Component Value Date/Time   PHART 7.311 (L) 10/14/2020 1913   HCO3 22.2 10/14/2020 1913   TCO2 23 10/14/2020 1913   ACIDBASEDEF 4.0 (H) 10/14/2020 1913   O2SAT 96.0 10/14/2020 1913   CBG (last 3)  Recent Labs    10/16/20 1557 10/16/20 2127 10/17/20 0613  GLUCAP 126* 142* 90    Assessment/Plan: S/P Procedure(s) (LRB): AORTIC VALVE REPLACEMENT (AVR) USING INSPIRIS  RESILIA AORTIC VALVE (N/A) CORONARY ARTERY BYPASS GRAFTING (CABG) TIMES FOUR, USING LEFT INTERNAL MAMMARY ARTERY AND RIGHT GREATER SAPHENOUS VEIN HARVESTED ENDOSCOPICALLY (N/A) TRANSESOPHAGEAL ECHOCARDIOGRAM (TEE) (N/A)  POD 3 Hemodynamically stable in sinus rhythm. Continue Lopressor and Avapro.  Postop atrial fib: converted with amio. Switch to 400 bid po and continue loading.  Volume excess: -1400 cc yesterday. Wt is 5 lbs over preop.  Active 1ppd smoking and COPD: start mucinex and ventolin, continue INCRUZE. Work on IS, flutter valve.  DM: glucose under good control on Levemir and SSI. Transfer to 4E and continue mobilization.   LOS: 3 days    Alleen Borne 10/17/2020

## 2020-10-17 NOTE — Progress Notes (Addendum)
EVENING ROUNDS NOTE :     [address]       [place] [id number]             [phone]                 3 Days Post-Op Procedure(s) (LRB): AORTIC VALVE REPLACEMENT (AVR) USING INSPIRIS RESILIA AORTIC VALVE (N/A) CORONARY ARTERY BYPASS GRAFTING (CABG) TIMES FOUR, USING LEFT INTERNAL MAMMARY ARTERY AND RIGHT GREATER SAPHENOUS VEIN HARVESTED ENDOSCOPICALLY (N/A) TRANSESOPHAGEAL ECHOCARDIOGRAM (TEE) (N/A)  Total Length of Stay:  LOS: 3 days  BP (!) 149/84   Pulse 69   Temp 98.2 F (36.8 C) (Oral)   Resp (!) 21   Ht 5\' 11"  (1.803 m)   Wt 90.7 kg   SpO2 95%   BMI 27.89 kg/m   .Intake/Output      09/11 0701 09/12 0700 09/12 0701 09/13 0700   P.O. 100    I.V. (mL/kg) 939 (10.4) 166.9 (1.8)   IV Piggyback 50    Total Intake(mL/kg) 1089 (12) 166.9 (1.8)   Urine (mL/kg/hr) 2460 (1.1) 325 (0.4)   Chest Tube     Total Output 2460 325   Net -1371 -158.2           sodium chloride Stopped (10/15/20 0828)   sodium chloride     sodium chloride     lactated ringers 20 mL/hr at 10/17/20 0700   lactated ringers       Lab Results  Component Value Date   WBC 16.5 (H) 10/17/2020   HGB 8.1 (L) 10/17/2020   HCT 24.2 (L) 10/17/2020   PLT 151 10/17/2020   GLUCOSE 92 10/17/2020   CHOL 82 11/27/2019   TRIG 60 11/27/2019   HDL 37 (L) 11/27/2019   LDLCALC 33 11/27/2019   ALT 14 10/17/2020   AST 36 10/17/2020   NA 136 10/17/2020   K 3.5 10/17/2020   CL 98 10/17/2020   CREATININE 1.05 10/17/2020   BUN 22 (H) 10/17/2020   CO2 28 10/17/2020   TSH 1.171 07/06/2020   INR 1.3 (H) 10/14/2020   HGBA1C 6.3 (H) 11/25/2019  SR, on oral Amiodarone, Lopressor 50 mg bid, and Irbesartan 37.5 mg daily Patient sitting in chair, napping. He was awakened. He is able to expectorate sputum and uses suction Progressing well;waiting bed on 4E   11/27/2019 Pa-C 10/17/2020 3:28 PM  Stable this evening, awaiting bed on 4E  Bary Limbach C. 12/17/2020, MD [facility] [phone]

## 2020-10-18 ENCOUNTER — Other Ambulatory Visit: Payer: Self-pay | Admitting: Cardiology

## 2020-10-18 DIAGNOSIS — I350 Nonrheumatic aortic (valve) stenosis: Secondary | ICD-10-CM

## 2020-10-18 LAB — GLUCOSE, CAPILLARY
Glucose-Capillary: 174 mg/dL — ABNORMAL HIGH (ref 70–99)
Glucose-Capillary: 201 mg/dL — ABNORMAL HIGH (ref 70–99)
Glucose-Capillary: 70 mg/dL (ref 70–99)
Glucose-Capillary: 79 mg/dL (ref 70–99)

## 2020-10-18 LAB — BASIC METABOLIC PANEL
Anion gap: 9 (ref 5–15)
BUN: 20 mg/dL (ref 6–20)
CO2: 29 mmol/L (ref 22–32)
Calcium: 8.6 mg/dL — ABNORMAL LOW (ref 8.9–10.3)
Chloride: 99 mmol/L (ref 98–111)
Creatinine, Ser: 1.03 mg/dL (ref 0.61–1.24)
GFR, Estimated: >60 mL/min (ref >60)
Glucose, Bld: 69 mg/dL — ABNORMAL LOW (ref 70–99)
Potassium: 3.3 mmol/L — ABNORMAL LOW (ref 3.5–5.1)
Sodium: 137 mmol/L (ref 135–145)

## 2020-10-18 LAB — CBC
HCT: 26.4 % — ABNORMAL LOW (ref 39.0–52.0)
Hemoglobin: 8.8 g/dL — ABNORMAL LOW (ref 13.0–17.0)
MCH: 26.4 pg (ref 26.0–34.0)
MCHC: 33.3 g/dL (ref 30.0–36.0)
MCV: 79.3 fL — ABNORMAL LOW (ref 80.0–100.0)
Platelets: 205 10*3/uL (ref 150–400)
RBC: 3.33 MIL/uL — ABNORMAL LOW (ref 4.22–5.81)
RDW: 19.6 % — ABNORMAL HIGH (ref 11.5–15.5)
WBC: 13.8 10*3/uL — ABNORMAL HIGH (ref 4.0–10.5)
nRBC: 0.2 % (ref 0.0–0.2)

## 2020-10-18 MED ORDER — POTASSIUM CHLORIDE CRYS ER 20 MEQ PO TBCR
20.0000 meq | EXTENDED_RELEASE_TABLET | ORAL | Status: AC
  Administered 2020-10-18 (×3): 20 meq via ORAL
  Filled 2020-10-18 (×3): qty 1

## 2020-10-18 NOTE — Progress Notes (Signed)
4 Days Post-Op Procedure(s) (LRB): AORTIC VALVE REPLACEMENT (AVR) USING INSPIRIS RESILIA AORTIC VALVE (N/A) CORONARY ARTERY BYPASS GRAFTING (CABG) TIMES FOUR, USING LEFT INTERNAL MAMMARY ARTERY AND RIGHT GREATER SAPHENOUS VEIN HARVESTED ENDOSCOPICALLY (N/A) TRANSESOPHAGEAL ECHOCARDIOGRAM (TEE) (N/A) Subjective: Only complaint is coughing up sputum. Ambulated well this am.  Objective: Vital signs in last 24 hours: Temp:  [97.6 F (36.4 C)-99.1 F (37.3 C)] 98.8 F (37.1 C) (09/13 0400) Pulse Rate:  [66-82] 77 (09/13 0600) Cardiac Rhythm: Normal sinus rhythm (09/12 2200) Resp:  [10-26] 20 (09/13 0600) BP: (93-158)/(53-84) 137/80 (09/13 0600) SpO2:  [90 %-100 %] 93 % (09/13 0600) Weight:  [89 kg] 89 kg (09/13 0645)  Hemodynamic parameters for last 24 hours:    Intake/Output from previous day: 09/12 0701 - 09/13 0700 In: 346.9 [P.O.:100; I.V.:246.9] Out: 2425 [Urine:2425] Intake/Output this shift: No intake/output data recorded.  General appearance: alert and cooperative Neurologic: intact Heart: regular rate and rhythm Lungs: clear to auscultation bilaterally Extremities: extremities normal, atraumatic, no cyanosis or edema Wound: incision looks good.  Lab Results: Recent Labs    10/17/20 0510 10/18/20 0026  WBC 16.5* 13.8*  HGB 8.1* 8.8*  HCT 24.2* 26.4*  PLT 151 205   BMET:  Recent Labs    10/17/20 0510 10/18/20 0026  NA 136 137  K 3.5 3.3*  CL 98 99  CO2 28 29  GLUCOSE 92 69*  BUN 22* 20  CREATININE 1.05 1.03  CALCIUM 8.5* 8.6*    PT/INR: No results for input(s): LABPROT, INR in the last 72 hours. ABG    Component Value Date/Time   PHART 7.311 (L) 10/14/2020 1913   HCO3 22.2 10/14/2020 1913   TCO2 23 10/14/2020 1913   ACIDBASEDEF 4.0 (H) 10/14/2020 1913   O2SAT 96.0 10/14/2020 1913   CBG (last 3)  Recent Labs    10/17/20 1608 10/17/20 2121 10/18/20 0624  GLUCAP 159* 125* 79    Assessment/Plan: S/P Procedure(s) (LRB): AORTIC VALVE  REPLACEMENT (AVR) USING INSPIRIS RESILIA AORTIC VALVE (N/A) CORONARY ARTERY BYPASS GRAFTING (CABG) TIMES FOUR, USING LEFT INTERNAL MAMMARY ARTERY AND RIGHT GREATER SAPHENOUS VEIN HARVESTED ENDOSCOPICALLY (N/A) TRANSESOPHAGEAL ECHOCARDIOGRAM (TEE) (N/A)  POD 4 Hemodynamically stable in sinus rhythm on po amio and lopressor.  DC pacing wires.  Diuresed well yesterday -2L. Wt is at preop. Will stop diuretic and replace K+  DM: glucose under good control.  Awaiting bed on 4E. Potentially home tomorrow if lungs stay clear. Will repeat CXR in am.   LOS: 4 days    Alleen Borne 10/18/2020

## 2020-10-18 NOTE — Progress Notes (Signed)
Pt received from 2H. VSS. CHG complete. PT and family oriented to room and unit. Call light in reach.  Versie Starks, RN

## 2020-10-18 NOTE — Progress Notes (Signed)
Pacing Wire Removal  Pacing wires removed at 1035.  Patient educated on bed rest. Vitals per protocol.   Malva Limes RN

## 2020-10-18 NOTE — Progress Notes (Signed)
CARDIAC REHAB PHASE I   PRE:  Rate/Rhythm: 82 SR  BP:  Sitting: 134/61      SaO2: 94 RA  MODE:  Ambulation: 370 ft   POST:  Rate/Rhythm: 89 SR with PVCs  BP:  Sitting: 130/91    SaO2: 95 RA   Pt helped from BR than ambulated 317ft in hallway standby assist with EVA. Pt denies CP, SOB, or dizziness throughout walk. Pt able to converse and make jokes throughout walk. Pt returned to recliner. Offered d/c education, pt declines at this time, does not know that he will feel ready for d/c tomorrow. Family at bedside. Encouraged continued IS use and walks. Will continue to follow.  [id number] Reynold Bowen, RN BSN 10/18/2020 2:04 PM

## 2020-10-18 NOTE — Progress Notes (Addendum)
EVENING ROUNDS NOTE :     [address]       [place] [address]             [phone]                 4 Days Post-Op Procedure(s) (LRB): AORTIC VALVE REPLACEMENT (AVR) USING INSPIRIS RESILIA AORTIC VALVE (N/A) CORONARY ARTERY BYPASS GRAFTING (CABG) TIMES FOUR, USING LEFT INTERNAL MAMMARY ARTERY AND RIGHT GREATER SAPHENOUS VEIN HARVESTED ENDOSCOPICALLY (N/A) TRANSESOPHAGEAL ECHOCARDIOGRAM (TEE) (N/A)  Total Length of Stay:  LOS: 4 days  BP (!) 130/91   Pulse 76   Temp 98.6 F (37 C) (Oral)   Resp (!) 9   Ht 5\' 11"  (1.803 m)   Wt 89 kg   SpO2 95%   BMI 27.37 kg/m   .Intake/Output      09/12 0701 09/13 0700 09/13 0701 09/14 0700   P.O. 100    I.V. (mL/kg) 246.9 (2.8)    IV Piggyback     Total Intake(mL/kg) 346.9 (3.9)    Urine (mL/kg/hr) 2425 (1.1)    Stool 0    Total Output 2425    Net -2078.2         Urine Occurrence 2 x    Stool Occurrence 2 x       sodium chloride       Lab Results  Component Value Date   WBC 13.8 (H) 10/18/2020   HGB 8.8 (L) 10/18/2020   HCT 26.4 (L) 10/18/2020   PLT 205 10/18/2020   GLUCOSE 69 (L) 10/18/2020   CHOL 82 11/27/2019   TRIG 60 11/27/2019   HDL 37 (L) 11/27/2019   LDLCALC 33 11/27/2019   ALT 14 10/17/2020   AST 36 10/17/2020   NA 137 10/18/2020   K 3.3 (L) 10/18/2020   CL 99 10/18/2020   CREATININE 1.03 10/18/2020   BUN 20 10/18/2020   CO2 29 10/18/2020   TSH 1.171 07/06/2020   INR 1.3 (H) 10/14/2020   HGBA1C 6.3 (H) 11/25/2019   Remains very stable Ambulating well in unit Plan for d/c in am if no new issues    Wayne E Gold   No events Doing well Awaiting floor bed  Varshini Arrants O [facility]

## 2020-10-19 ENCOUNTER — Inpatient Hospital Stay (HOSPITAL_COMMUNITY): Payer: Medicaid Other

## 2020-10-19 LAB — GLUCOSE, CAPILLARY: Glucose-Capillary: 145 mg/dL — ABNORMAL HIGH (ref 70–99)

## 2020-10-19 MED ORDER — OXYCODONE HCL 5 MG PO TABS: 5.0000 mg | ORAL_TABLET | Freq: Four times a day (QID) | ORAL | 0 refills | Status: DC | PRN

## 2020-10-19 MED ORDER — ASPIRIN 325 MG PO TBEC: 325.0000 mg | DELAYED_RELEASE_TABLET | Freq: Every day | ORAL | Status: DC

## 2020-10-19 MED ORDER — FE FUMARATE-B12-VIT C-FA-IFC PO CAPS: 1.0000 | ORAL_CAPSULE | Freq: Two times a day (BID) | ORAL | 1 refills | Status: DC

## 2020-10-19 MED ORDER — AMIODARONE HCL 400 MG PO TABS: 400.0000 mg | ORAL_TABLET | Freq: Two times a day (BID) | ORAL | 1 refills | Status: DC

## 2020-10-19 MED ORDER — ATORVASTATIN CALCIUM 80 MG PO TABS: 80.0000 mg | ORAL_TABLET | Freq: Every day | ORAL | 1 refills | Status: DC

## 2020-10-19 NOTE — Progress Notes (Addendum)
Peripheral IV cath was occluded and leaked. IV cath removed from left fore arm. Pt refused a new peripheral IV cath. Pt has anticipated to be discharged tomorrow.    He is hemodynamically stable, febrile, temp 100.2 F at 11:34 pm, no distress noted, NSR on monitor. SPO2 97% on room air. Auscultated lungs appeared to have rhonchi left > right. He has strong and productive cough. Breathing treatment was given by RT per schedule. His pain was well tolerated. Sternal incision and right leg endoscopic venous harvesting [facility]) had no drainage. We cleaned with Betadine. Continue to monitor.  Filiberto Pinks, RN

## 2020-10-19 NOTE — Progress Notes (Signed)
CARDIAC REHAB PHASE I   PRE:  Rate/Rhythm: 87 SR  BP:  Sitting: 149/80     MODE:  Ambulation: 470 ft   POST:  Rate/Rhythm: 114 ST  BP:  Sitting: 179/82      SaO2: 95 RA  Pt tolerated exercise well and amb 470 ft independently with standby assist. Pt denies CP, SOB, or dizziness throughout walk. Pt was very energetic this morning and got out of bed to start walking. Educated pt on sternal precautions, wound care, heart healthy diet, smoking cessation, and exercise guidelines. Encouraged IS use. Pt was receptive to ed. Will refer pt to [facility]. Will continue to follow.  [id number] Joya San, MS, ACSM-CEP 10/19/2020 8:58 AM

## 2020-10-19 NOTE — Progress Notes (Addendum)
[address]       [place] [address]             [phone]      5 Days Post-Op Procedure(s) (LRB): AORTIC VALVE REPLACEMENT (AVR) USING INSPIRIS RESILIA AORTIC VALVE (N/A) CORONARY ARTERY BYPASS GRAFTING (CABG) TIMES FOUR, USING LEFT INTERNAL MAMMARY ARTERY AND RIGHT GREATER SAPHENOUS VEIN HARVESTED ENDOSCOPICALLY (N/A) TRANSESOPHAGEAL ECHOCARDIOGRAM (TEE) (N/A) Subjective: Looks and feels well  Objective: Vital signs in last 24 hours: Temp:  [98.1 F (36.7 C)-100.2 F (37.9 C)] 98.9 F (37.2 C) (09/14 0452) Pulse Rate:  [69-88] 88 (09/14 0452) Cardiac Rhythm: Normal sinus rhythm (09/14 0740) Resp:  [9-21] 17 (09/14 0452) BP: (101-161)/(72-91) 135/82 (09/14 0452) SpO2:  [92 %-99 %] 92 % (09/14 0452) Weight:  [87.5 kg] 87.5 kg (09/14 0453)  Hemodynamic parameters for last 24 hours:    Intake/Output from previous day: 09/13 0701 - 09/14 0700 In: 750 [P.O.:750] Out: -  Intake/Output this shift: No intake/output data recorded.  General appearance: alert, cooperative, and no distress Heart: regular rate and rhythm Lungs: clear to auscultation bilaterally Abdomen: benign Extremities: no edema Wound: echymosis from evh right thigh, no hematoma  Lab Results: Recent Labs    10/17/20 0510 10/18/20 0026  WBC 16.5* 13.8*  HGB 8.1* 8.8*  HCT 24.2* 26.4*  PLT 151 205   BMET:  Recent Labs    10/17/20 0510 10/18/20 0026  NA 136 137  K 3.5 3.3*  CL 98 99  CO2 28 29  GLUCOSE 92 69*  BUN 22* 20  CREATININE 1.05 1.03  CALCIUM 8.5* 8.6*    PT/INR: No results for input(s): LABPROT, INR in the last 72 hours. ABG    Component Value Date/Time   PHART 7.311 (L) 10/14/2020 1913   HCO3 22.2 10/14/2020 1913   TCO2 23 10/14/2020 1913   ACIDBASEDEF 4.0 (H) 10/14/2020 1913   O2SAT 96.0 10/14/2020 1913   CBG (last 3)  Recent Labs    10/18/20 1611 10/18/20 2346 10/19/20 0603  GLUCAP 70 174* 145*    Meds Scheduled Meds:   acetaminophen  1,000 mg Oral Q6H   Or   acetaminophen (TYLENOL) oral liquid 160 mg/5 mL  1,000 mg Per Tube Q6H   albuterol  2.5 mg Inhalation Q6H   amiodarone  400 mg Oral BID   aspirin EC  325 mg Oral Daily   atorvastatin  80 mg Oral Daily   Chlorhexidine Gluconate Cloth  6 each Topical Daily   ferrous fumarate-b12-vitamic C-folic acid  1 capsule Oral BID PC   guaiFENesin  1,200 mg Oral BID   insulin aspart  0-24 Units Subcutaneous TID AC & HS   insulin detemir  20 Units Subcutaneous Daily   irbesartan  37.5 mg Oral Daily   latanoprost  1 drop Right Eye QHS   metoprolol tartrate  50 mg Oral BID   pantoprazole  40 mg Oral QAC breakfast   senna-docusate  2 tablet Oral QHS   sodium chloride flush  3 mL Intravenous Q12H   umeclidinium bromide  1 puff Inhalation Daily   Continuous Infusions:  sodium chloride     PRN Meds:.sodium chloride, ondansetron **OR** ondansetron (ZOFRAN) IV, oxyCODONE, sodium chloride flush, traMADol  Xrays No results found.  Assessment/Plan: S/P Procedure(s) (LRB): AORTIC VALVE REPLACEMENT (AVR) USING INSPIRIS RESILIA AORTIC VALVE (N/A) CORONARY ARTERY BYPASS GRAFTING (CABG) TIMES FOUR, USING LEFT INTERNAL MAMMARY ARTERY AND RIGHT GREATER SAPHENOUS VEIN HARVESTED ENDOSCOPICALLY (N/A)  TRANSESOPHAGEAL ECHOCARDIOGRAM (TEE) (N/A)   1 Tmax 100.2, VSS sBP 100's-160's, mostly 120's-130's, sinus rhythm- will change to home diovan at d/c 2 sats good on RA 3 CXR - clear lung fields 4 BS adeq control- will resume home meds 5 no new labs 6 appears stable for d/c  LOS: 5 days    Rowe Clack 10/19/2020    Chart reviewed, patient examined, agree with above. He looks good and can go home today.

## 2020-10-19 NOTE — Progress Notes (Signed)
D/C instructions given to patient. Medications and wound care reviewed. All questions answered. Family to escort pt home.  Versie Starks, RN

## 2020-10-20 MED FILL — Lidocaine HCl Local Preservative Free (PF) Inj 1%: INTRAMUSCULAR | Qty: 5 | Status: AC

## 2020-10-20 MED FILL — Heparin Sodium (Porcine) Inj 1000 Unit/ML: INTRAMUSCULAR | Qty: 30 | Status: AC

## 2020-10-20 MED FILL — Sodium Chloride IV Soln 0.9%: INTRAVENOUS | Qty: 2000 | Status: AC

## 2020-10-20 MED FILL — Electrolyte-R (PH 7.4) Solution: INTRAVENOUS | Qty: 3000 | Status: AC

## 2020-10-20 MED FILL — Sodium Bicarbonate IV Soln 8.4%: INTRAVENOUS | Qty: 50 | Status: AC

## 2020-10-20 MED FILL — Heparin Sodium (Porcine) Inj 1000 Unit/ML: INTRAMUSCULAR | Qty: 10 | Status: AC

## 2020-10-21 ENCOUNTER — Telehealth (HOSPITAL_COMMUNITY): Payer: Self-pay

## 2020-10-21 NOTE — Telephone Encounter (Signed)
Per phase I cardiac rehab, fax cardiac rehab referral to [facility]. 

## 2020-10-23 ENCOUNTER — Emergency Department (HOSPITAL_COMMUNITY): Payer: Medicaid Other

## 2020-10-23 ENCOUNTER — Inpatient Hospital Stay (HOSPITAL_COMMUNITY)
Admission: EM | Admit: 2020-10-23 | Discharge: 2020-10-25 | DRG: 176 | Disposition: A | Discharge status: Home / Self Care | Payer: Medicaid Other | Attending: Family Medicine | Admitting: Family Medicine

## 2020-10-23 ENCOUNTER — Encounter (HOSPITAL_COMMUNITY): Payer: Self-pay | Admitting: Emergency Medicine

## 2020-10-23 DIAGNOSIS — F17210 Nicotine dependence, cigarettes, uncomplicated: Secondary | ICD-10-CM | POA: Diagnosis present

## 2020-10-23 DIAGNOSIS — Z951 Presence of aortocoronary bypass graft: Secondary | ICD-10-CM | POA: Diagnosis not present

## 2020-10-23 DIAGNOSIS — R0609 Other forms of dyspnea: Secondary | ICD-10-CM | POA: Diagnosis present

## 2020-10-23 DIAGNOSIS — Z635 Disruption of family by separation and divorce: Secondary | ICD-10-CM

## 2020-10-23 DIAGNOSIS — Z79899 Other long term (current) drug therapy: Secondary | ICD-10-CM

## 2020-10-23 DIAGNOSIS — Z953 Presence of xenogenic heart valve: Secondary | ICD-10-CM

## 2020-10-23 DIAGNOSIS — K219 Gastro-esophageal reflux disease without esophagitis: Secondary | ICD-10-CM | POA: Diagnosis present

## 2020-10-23 DIAGNOSIS — E785 Hyperlipidemia, unspecified: Secondary | ICD-10-CM | POA: Diagnosis present

## 2020-10-23 DIAGNOSIS — E119 Type 2 diabetes mellitus without complications: Secondary | ICD-10-CM | POA: Diagnosis present

## 2020-10-23 DIAGNOSIS — Y839 Surgical procedure, unspecified as the cause of abnormal reaction of the patient, or of later complication, without mention of misadventure at the time of the procedure: Secondary | ICD-10-CM | POA: Diagnosis present

## 2020-10-23 DIAGNOSIS — Z8673 Personal history of transient ischemic attack (TIA), and cerebral infarction without residual deficits: Secondary | ICD-10-CM

## 2020-10-23 DIAGNOSIS — I9789 Other postprocedural complications and disorders of the circulatory system, not elsewhere classified: Secondary | ICD-10-CM | POA: Diagnosis present

## 2020-10-23 DIAGNOSIS — I2510 Atherosclerotic heart disease of native coronary artery without angina pectoris: Secondary | ICD-10-CM | POA: Diagnosis present

## 2020-10-23 DIAGNOSIS — Z833 Family history of diabetes mellitus: Secondary | ICD-10-CM

## 2020-10-23 DIAGNOSIS — R609 Edema, unspecified: Secondary | ICD-10-CM

## 2020-10-23 DIAGNOSIS — Z7984 Long term (current) use of oral hypoglycemic drugs: Secondary | ICD-10-CM

## 2020-10-23 DIAGNOSIS — Z8042 Family history of malignant neoplasm of prostate: Secondary | ICD-10-CM

## 2020-10-23 DIAGNOSIS — I10 Essential (primary) hypertension: Secondary | ICD-10-CM | POA: Diagnosis present

## 2020-10-23 DIAGNOSIS — D72829 Elevated white blood cell count, unspecified: Secondary | ICD-10-CM | POA: Diagnosis present

## 2020-10-23 DIAGNOSIS — H401133 Primary open-angle glaucoma, bilateral, severe stage: Secondary | ICD-10-CM | POA: Diagnosis present

## 2020-10-23 DIAGNOSIS — I471 Supraventricular tachycardia: Secondary | ICD-10-CM | POA: Diagnosis present

## 2020-10-23 DIAGNOSIS — D539 Nutritional anemia, unspecified: Secondary | ICD-10-CM | POA: Diagnosis present

## 2020-10-23 DIAGNOSIS — I2694 Multiple subsegmental pulmonary emboli without acute cor pulmonale: Principal | ICD-10-CM | POA: Diagnosis present

## 2020-10-23 DIAGNOSIS — I480 Paroxysmal atrial fibrillation: Secondary | ICD-10-CM | POA: Diagnosis present

## 2020-10-23 DIAGNOSIS — E876 Hypokalemia: Secondary | ICD-10-CM | POA: Diagnosis present

## 2020-10-23 DIAGNOSIS — Z7982 Long term (current) use of aspirin: Secondary | ICD-10-CM

## 2020-10-23 DIAGNOSIS — Z20822 Contact with and (suspected) exposure to covid-19: Secondary | ICD-10-CM | POA: Diagnosis present

## 2020-10-23 DIAGNOSIS — R7989 Other specified abnormal findings of blood chemistry: Secondary | ICD-10-CM

## 2020-10-23 DIAGNOSIS — Z888 Allergy status to other drugs, medicaments and biological substances status: Secondary | ICD-10-CM

## 2020-10-23 DIAGNOSIS — F32A Depression, unspecified: Secondary | ICD-10-CM | POA: Diagnosis present

## 2020-10-23 DIAGNOSIS — Z955 Presence of coronary angioplasty implant and graft: Secondary | ICD-10-CM

## 2020-10-23 DIAGNOSIS — Z952 Presence of prosthetic heart valve: Secondary | ICD-10-CM | POA: Diagnosis not present

## 2020-10-23 DIAGNOSIS — K5900 Constipation, unspecified: Secondary | ICD-10-CM | POA: Diagnosis present

## 2020-10-23 DIAGNOSIS — I2699 Other pulmonary embolism without acute cor pulmonale: Secondary | ICD-10-CM | POA: Diagnosis present

## 2020-10-23 DIAGNOSIS — Z8249 Family history of ischemic heart disease and other diseases of the circulatory system: Secondary | ICD-10-CM | POA: Diagnosis not present

## 2020-10-23 DIAGNOSIS — R778 Other specified abnormalities of plasma proteins: Secondary | ICD-10-CM | POA: Diagnosis present

## 2020-10-23 DIAGNOSIS — E1165 Type 2 diabetes mellitus with hyperglycemia: Secondary | ICD-10-CM | POA: Diagnosis not present

## 2020-10-23 LAB — CBC WITH DIFFERENTIAL/PLATELET
Abs Immature Granulocytes: 0.27 10*3/uL — ABNORMAL HIGH (ref 0.00–0.07)
Basophils Absolute: 0 10*3/uL (ref 0.0–0.1)
Basophils Relative: 0 %
Eosinophils Absolute: 0.2 10*3/uL (ref 0.0–0.5)
Eosinophils Relative: 1 %
HCT: 29.7 % — ABNORMAL LOW (ref 39.0–52.0)
Hemoglobin: 9.5 g/dL — ABNORMAL LOW (ref 13.0–17.0)
Immature Granulocytes: 2 %
Lymphocytes Relative: 11 %
Lymphs Abs: 1.9 10*3/uL (ref 0.7–4.0)
MCH: 26.5 pg (ref 26.0–34.0)
MCHC: 32 g/dL (ref 30.0–36.0)
MCV: 83 fL (ref 80.0–100.0)
Monocytes Absolute: 1.4 10*3/uL — ABNORMAL HIGH (ref 0.1–1.0)
Monocytes Relative: 8 %
Neutro Abs: 14.3 10*3/uL — ABNORMAL HIGH (ref 1.7–7.7)
Neutrophils Relative %: 78 %
Platelets: 617 10*3/uL — ABNORMAL HIGH (ref 150–400)
RBC: 3.58 MIL/uL — ABNORMAL LOW (ref 4.22–5.81)
RDW: 18.3 % — ABNORMAL HIGH (ref 11.5–15.5)
WBC: 18.1 10*3/uL — ABNORMAL HIGH (ref 4.0–10.5)
nRBC: 0 % (ref 0.0–0.2)

## 2020-10-23 LAB — I-STAT CHEM 8, ED
BUN: 19 mg/dL (ref 6–20)
Calcium, Ion: 1.12 mmol/L — ABNORMAL LOW (ref 1.15–1.40)
Chloride: 99 mmol/L (ref 98–111)
Creatinine, Ser: 1.1 mg/dL (ref 0.61–1.24)
Glucose, Bld: 136 mg/dL — ABNORMAL HIGH (ref 70–99)
HCT: 31 % — ABNORMAL LOW (ref 39.0–52.0)
Hemoglobin: 10.5 g/dL — ABNORMAL LOW (ref 13.0–17.0)
Potassium: 3.7 mmol/L (ref 3.5–5.1)
Sodium: 136 mmol/L (ref 135–145)
TCO2: 25 mmol/L (ref 22–32)

## 2020-10-23 LAB — COMPREHENSIVE METABOLIC PANEL
ALT: 18 U/L (ref 0–44)
AST: 25 U/L (ref 15–41)
Albumin: 3.2 g/dL — ABNORMAL LOW (ref 3.5–5.0)
Alkaline Phosphatase: 61 U/L (ref 38–126)
Anion gap: 16 — ABNORMAL HIGH (ref 5–15)
BUN: 19 mg/dL (ref 6–20)
CO2: 22 mmol/L (ref 22–32)
Calcium: 9.1 mg/dL (ref 8.9–10.3)
Chloride: 98 mmol/L (ref 98–111)
Creatinine, Ser: 1.26 mg/dL — ABNORMAL HIGH (ref 0.61–1.24)
GFR, Estimated: >60 mL/min (ref >60)
Glucose, Bld: 138 mg/dL — ABNORMAL HIGH (ref 70–99)
Potassium: 3.7 mmol/L (ref 3.5–5.1)
Sodium: 136 mmol/L (ref 135–145)
Total Bilirubin: 1.2 mg/dL (ref 0.3–1.2)
Total Protein: 6.8 g/dL (ref 6.5–8.1)

## 2020-10-23 LAB — HEMOGLOBIN A1C
Hgb A1c MFr Bld: 6.5 % — ABNORMAL HIGH (ref 4.8–5.6)
Mean Plasma Glucose: 139.85 mg/dL

## 2020-10-23 LAB — RESP PANEL BY RT-PCR (FLU A&B, COVID) ARPGX2
Influenza A by PCR: NEGATIVE
Influenza B by PCR: NEGATIVE
SARS Coronavirus 2 by RT PCR: NEGATIVE

## 2020-10-23 LAB — HEPARIN LEVEL (UNFRACTIONATED): Heparin Unfractionated: 0.36 IU/mL (ref 0.30–0.70)

## 2020-10-23 LAB — D-DIMER, QUANTITATIVE: D-Dimer, Quant: 10.71 ug/mL-FEU — ABNORMAL HIGH (ref 0.00–0.50)

## 2020-10-23 LAB — CBG MONITORING, ED
Glucose-Capillary: 134 mg/dL — ABNORMAL HIGH (ref 70–99)
Glucose-Capillary: 200 mg/dL — ABNORMAL HIGH (ref 70–99)

## 2020-10-23 LAB — TROPONIN I (HIGH SENSITIVITY)
Troponin I (High Sensitivity): 347 ng/L (ref <18)
Troponin I (High Sensitivity): 278 ng/L (ref <18)

## 2020-10-23 LAB — GLUCOSE, CAPILLARY: Glucose-Capillary: 240 mg/dL — ABNORMAL HIGH (ref 70–99)

## 2020-10-23 LAB — BRAIN NATRIURETIC PEPTIDE: B Natriuretic Peptide: 168 pg/mL — ABNORMAL HIGH (ref 0.0–100.0)

## 2020-10-23 MED ORDER — IOHEXOL 350 MG/ML SOLN
75.0000 mL | Freq: Once | INTRAVENOUS | Status: AC | PRN
  Administered 2020-10-23: 75 mL via INTRAVENOUS

## 2020-10-23 MED ORDER — HEPARIN BOLUS VIA INFUSION
5000.0000 [IU] | Freq: Once | INTRAVENOUS | Status: AC
  Administered 2020-10-23: 5000 [IU] via INTRAVENOUS
  Filled 2020-10-23: qty 5000

## 2020-10-23 MED ORDER — UMECLIDINIUM BROMIDE 62.5 MCG/INH IN AEPB
1.0000 | INHALATION_SPRAY | Freq: Every day | RESPIRATORY_TRACT | Status: DC
  Administered 2020-10-24 – 2020-10-25 (×2): 1 via RESPIRATORY_TRACT
  Filled 2020-10-23 (×2): qty 7

## 2020-10-23 MED ORDER — PANTOPRAZOLE SODIUM 40 MG PO TBEC
40.0000 mg | DELAYED_RELEASE_TABLET | Freq: Every day | ORAL | Status: DC
  Administered 2020-10-24 – 2020-10-25 (×2): 40 mg via ORAL
  Filled 2020-10-23 (×2): qty 1

## 2020-10-23 MED ORDER — ATORVASTATIN CALCIUM 40 MG PO TABS
80.0000 mg | ORAL_TABLET | Freq: Every day | ORAL | Status: DC
  Administered 2020-10-23 – 2020-10-25 (×3): 80 mg via ORAL
  Filled 2020-10-23 (×3): qty 2

## 2020-10-23 MED ORDER — AMIODARONE HCL 200 MG PO TABS
400.0000 mg | ORAL_TABLET | Freq: Two times a day (BID) | ORAL | Status: DC
  Administered 2020-10-23 – 2020-10-25 (×4): 400 mg via ORAL
  Filled 2020-10-23 (×4): qty 2

## 2020-10-23 MED ORDER — ACETAMINOPHEN 325 MG PO TABS
650.0000 mg | ORAL_TABLET | Freq: Four times a day (QID) | ORAL | Status: DC | PRN
  Administered 2020-10-24: 650 mg via ORAL
  Filled 2020-10-23 (×2): qty 2

## 2020-10-23 MED ORDER — INSULIN ASPART 100 UNIT/ML IJ SOLN
0.0000 [IU] | Freq: Three times a day (TID) | INTRAMUSCULAR | Status: DC
  Administered 2020-10-23: 2 [IU] via SUBCUTANEOUS
  Administered 2020-10-24: 3 [IU] via SUBCUTANEOUS

## 2020-10-23 MED ORDER — NICOTINE 14 MG/24HR TD PT24
14.0000 mg | MEDICATED_PATCH | Freq: Every day | TRANSDERMAL | Status: DC
  Administered 2020-10-25: 14 mg via TRANSDERMAL
  Filled 2020-10-23: qty 1

## 2020-10-23 MED ORDER — ACETAMINOPHEN 650 MG RE SUPP: 650.0000 mg | Freq: Four times a day (QID) | RECTAL | Status: DC | PRN

## 2020-10-23 MED ORDER — POLYETHYLENE GLYCOL 3350 17 G PO PACK: 17.0000 g | PACK | Freq: Every day | ORAL | Status: DC | PRN

## 2020-10-23 MED ORDER — OXYCODONE HCL 5 MG PO TABS
5.0000 mg | ORAL_TABLET | Freq: Four times a day (QID) | ORAL | Status: DC | PRN
  Administered 2020-10-24: 5 mg via ORAL
  Filled 2020-10-23: qty 1

## 2020-10-23 MED ORDER — ALBUTEROL SULFATE (2.5 MG/3ML) 0.083% IN NEBU: 2.5000 mg | INHALATION_SOLUTION | RESPIRATORY_TRACT | Status: DC | PRN

## 2020-10-23 MED ORDER — METOPROLOL TARTRATE 25 MG PO TABS
50.0000 mg | ORAL_TABLET | Freq: Two times a day (BID) | ORAL | Status: DC
  Administered 2020-10-23 – 2020-10-25 (×4): 50 mg via ORAL
  Filled 2020-10-23 (×4): qty 2

## 2020-10-23 MED ORDER — ASPIRIN EC 325 MG PO TBEC
325.0000 mg | DELAYED_RELEASE_TABLET | Freq: Every day | ORAL | Status: DC
  Administered 2020-10-23 – 2020-10-25 (×3): 325 mg via ORAL
  Filled 2020-10-23 (×3): qty 1

## 2020-10-23 MED ORDER — HEPARIN (PORCINE) 25000 UT/250ML-% IV SOLN
1550.0000 [IU]/h | INTRAVENOUS | Status: DC
Start: 2020-10-23 — End: 2020-10-25
  Administered 2020-10-23: 1450 [IU]/h via INTRAVENOUS
  Administered 2020-10-24: 1500 [IU]/h via INTRAVENOUS
  Administered 2020-10-25: 1550 [IU]/h via INTRAVENOUS
  Filled 2020-10-23 (×3): qty 250

## 2020-10-23 NOTE — Consult Note (Signed)
Cardiology Consultation:   Patient ID: Vincent Guerrero MRN: [id number]; DOB: 06-Aug-1960  Admit date: 10/23/2020 Date of Consult: 10/23/2020  PCP:  Oneita Hurt, No   [facility] Providers Cardiologist:  Thomasene Ripple, DO        Patient Profile:   Vincent Guerrero is a 60 y.o. male with a past medical history of severe AS (s/p AVR with 25 mm Edwards pericardial valve on 10/14/2020), CAD (s/p prior stent to RCA in 2010, recent CABG at time of AVR with LIMA-LAD, SVG-RI and seq SVG-OM-distal LCx), prior CVA, HTN, HLD, Type 2 DM and tobacco use who is being seen 10/23/2020 for the evaluation of chest pain at the request of Dr. Rhunette Croft.  History of Present Illness:   [name] was recently admitted to [facility] from 9/9 - 10/20/2020 for planned CABG and aortic valve replacement. He did have post-operative atrial fibrillation but converted back to NSR and was continued on PO Amiodarone at discharge. Was discharged on ASA 325mg  daily, Amiodarone 400mg  BID for 7 days then once daily, Atorvastatin 80mg  daily, PRN Clonidine, Lopressor 50mg  BID and Valsartan 40mg  daily from a cardiac perspective. Was not placed on anticoagulation.   He presents back to [facility] ED today for evaluation of chest pain, dyspnea and dizziness while occurred while walking outside. In talking with the patient today, he reports he was initially feeling well upon returning home but started to feel very weak throughout his entire body earlier this morning. Says he was walking from his house to his brother's house who is next-door and developed worsening weakness with associated dyspnea. Says that he could barely catch his breath by the time EMS arrived. Reports some intermittent chest discomfort at that time. He denies any current symptoms now. Reports his breathing feels stable and denies any chest pain. Says that he did experience palpitations yesterday which resembled his atrial fibrillation during admission but this spontaneously  resolved.  Initial labs show WBC 18.1, Hgb 9.5, platelets 617, Na+ 136, K+ 3.7 and creatinine 1.10. D-dimer 10.71. Initial Hs Troponin pending. CXR showing no acute findings. EKG showing NSR, HR 96 with LVH and ST elevation along the anterior leads and TWI along the inferior and precordial lateral leads which is similar to prior tracings.    Past Medical History:  Diagnosis Date   Age-related nuclear cataract of both eyes 11/27/2018   Anemia    Bilateral recurrent inguinal hernia without obstruction or gangrene 05/09/2016   Added automatically from request for surgery   Coronary artery disease involving native coronary artery without angina pectoris 05/2016   History of PCI to nondominant RCA. ->  Cath in April 2018 showed occluded nondominant RCA stent.  30% LM.  pLAD 40%.  Ostial RI 60%.   Cortical age-related cataract of both eyes 11/27/2018   Depression    Diabetes mellitus (HCC)    Hyperlipidemia LDL goal <70    Hypertension    Inguinal hernia without obstruction or gangrene    BILATERAL   Primary open angle glaucoma (POAG) of both eyes, severe stage 11/27/2018   Primary open angle glaucoma (POAG) of left eye, severe stage 11/27/2018   Stroke (HCC)    Tobacco abuse 11/05/2019   Tobacco use 11/06/2019    Past Surgical History:  Procedure Laterality Date   AORTIC VALVE REPLACEMENT N/A 10/14/2020   Procedure: AORTIC VALVE REPLACEMENT (AVR) USING 11/29/2018 INSPIRIS RESILIA AORTIC VALVE;  Surgeon: 11/29/2018, MD;  Location: [facility] OR;  Service: Open Heart Surgery;  Laterality: N/A;   BIOPSY  11/27/2019   Procedure: BIOPSY;  Surgeon: Sherrilyn Rist, MD;  Location: [facility] ENDOSCOPY;  Service: Gastroenterology;;   COLONOSCOPY WITH PROPOFOL N/A 11/27/2019   Procedure: COLONOSCOPY WITH PROPOFOL;  Surgeon: Sherrilyn Rist, MD;  Location: [facility] ENDOSCOPY;  Service: Gastroenterology;  Laterality: N/A;   CORONARY ARTERY BYPASS GRAFT N/A 10/14/2020   Procedure: CORONARY ARTERY BYPASS  GRAFTING (CABG) TIMES FOUR, USING LEFT INTERNAL MAMMARY ARTERY AND RIGHT GREATER SAPHENOUS VEIN HARVESTED ENDOSCOPICALLY;  Surgeon: Alleen Borne, MD;  Location: [facility] OR;  Service: Open Heart Surgery;  Laterality: N/A;   ESOPHAGOGASTRODUODENOSCOPY (EGD) WITH PROPOFOL N/A 11/27/2019   Procedure: ESOPHAGOGASTRODUODENOSCOPY (EGD) WITH PROPOFOL;  Surgeon: Sherrilyn Rist, MD;  Location: [facility] ENDOSCOPY;  Service: Gastroenterology;  Laterality: N/A;   HEMOSTASIS CLIP PLACEMENT  11/27/2019   Procedure: HEMOSTASIS CLIP PLACEMENT;  Surgeon: Sherrilyn Rist, MD;  Location: [facility] ENDOSCOPY;  Service: Gastroenterology;;   HERNIA REPAIR  1998   INTRAVASCULAR ULTRASOUND/IVUS N/A 09/12/2020   Procedure: Intravascular Ultrasound/IVUS;  Surgeon: Marykay Lex, MD;  Location: [facility] INVASIVE CV LAB;  Service: Cardiovascular;  Laterality: N/A;   LEFT HEART CATH AND CORONARY ANGIOGRAPHY N/A 06/04/2016   Procedure: Left Heart Cath and Coronary Angiography;  Surgeon: Lyn Records, MD;  Location: [facility] INVASIVE CV LAB;  Service: Cardiovascular;  Laterality: N/A;   RIGHT/LEFT HEART CATH AND CORONARY ANGIOGRAPHY N/A 09/12/2020   Procedure: RIGHT/LEFT HEART CATH AND CORONARY ANGIOGRAPHY;  Surgeon: Marykay Lex, MD;  Location: [facility] INVASIVE CV LAB;  Service: Cardiovascular;  Laterality: N/A;   TEE WITHOUT CARDIOVERSION N/A 08/30/2020   Procedure: TRANSESOPHAGEAL ECHOCARDIOGRAM (TEE);  Surgeon: Elease Hashimoto Deloris Ping, MD;  Location: [facility] ENDOSCOPY;  Service: Cardiovascular;  Laterality: N/A;   TEE WITHOUT CARDIOVERSION N/A 10/14/2020   Procedure: TRANSESOPHAGEAL ECHOCARDIOGRAM (TEE);  Surgeon: Alleen Borne, MD;  Location: [facility] OR;  Service: Open Heart Surgery;  Laterality: N/A;     Home Medications:  Prior to Admission medications   Medication Sig Start Date End Date Taking? Authorizing Provider  acetaminophen (TYLENOL) 500 MG tablet Take 1,000 mg by mouth every 6 (six) hours as needed for headache or mild pain (when not taking BC  powders).    [provider]  amiodarone (PACERONE) 400 MG tablet Take 1 tablet (400 mg total) by mouth 2 (two) times daily. For 7 days, then once daily 10/19/20   Gold, Glenice Laine, PA-C  aspirin EC 325 MG EC tablet Take 1 tablet (325 mg total) by mouth daily. 10/19/20   Gold, Wayne E, PA-C  atorvastatin (LIPITOR) 80 MG tablet Take 1 tablet (80 mg total) by mouth daily. 10/19/20   Gold, Deniece Portela E, PA-C  cloNIDine (CATAPRES) 0.1 MG tablet Take 0.1 mg by mouth See admin instructions. Take 0.1 mg by mouth one to three times a day as needed for a Systolic reading of 180 or greater    [provider]  ferrous fumarate-b12-vitamic C-folic acid (TRINSICON / FOLTRIN) capsule Take 1 capsule by mouth 2 (two) times daily after a meal. 10/19/20   Gold, Wayne E, PA-C  glipiZIDE (GLUCOTROL) 10 MG tablet Take 10 mg by mouth 2 (two) times daily before a meal.  11/19/18   [provider]  latanoprost (XALATAN) 0.005 % ophthalmic solution Place 1 drop into the right eye at bedtime.    [provider]  metFORMIN (GLUCOPHAGE) 1000 MG tablet Take 1,000 mg by mouth 2 (two) times daily with a meal.  [provider]  metoprolol (LOPRESSOR) 50 MG tablet Take 1 tablet (50 mg total) by mouth 2 (two) times daily. 05/11/16   Janetta Hora, PA-C  Nicotine (NICODERM CQ TD) Place 1 patch onto the skin daily as needed (for smoking cessation).    [provider]  oxyCODONE (OXY IR/ROXICODONE) 5 MG immediate release tablet Take 1 tablet (5 mg total) by mouth every 6 (six) hours as needed for severe pain. 10/19/20   Barrett, Erin R, PA-C  pantoprazole (PROTONIX) 40 MG tablet Take 1 tablet (40 mg total) by mouth daily. Patient taking differently: Take 40 mg by mouth daily before breakfast. 06/02/20   Tobb, Kardie, DO  PATADAY 0.2 % SOLN Place 1 drop into the right eye 2 (two) times daily.    [provider]  Tiotropium Bromide Monohydrate (SPIRIVA RESPIMAT) 2.5 MCG/ACT AERS Inhale  1 puff into the lungs 2 (two) times daily.    [provider]  valsartan (DIOVAN) 40 MG tablet Take 1 tablet (40 mg total) by mouth daily. Patient taking differently: Take 40 mg by mouth 2 (two) times daily. 08/01/20   Thomasene Ripple, DO    Inpatient Medications: Scheduled Meds:  Continuous Infusions:  PRN Meds:   Allergies:    Allergies  Allergen Reactions   Cetacaine [Butamben-Tetracaine-Benzocaine] Shortness Of Breath and Other (See Comments)    Intense laryngospasm    Social History:   Social History   Socioeconomic History   Marital status: Legally Separated    Spouse name: Not on file   Number of children: Not on file   Years of education: Not on file   Highest education level: Not on file  Occupational History   Not on file  Tobacco Use   Smoking status: Every Day    Packs/day: 1.00    Years: 40.00    Pack years: 40.00    Types: Cigarettes   Smokeless tobacco: Former    Types: Chew   Tobacco comments:    half pack daily-05/05/2020-AH  Vaping Use   Vaping Use: Never used  Substance and Sexual Activity   Alcohol use: No   Drug use: Yes    Types: Marijuana    Comment: 1-2 times per month   Sexual activity: Yes  Other Topics Concern   Not on file  Social History Narrative   Not on file   Social Determinants of Health   Financial Resource Strain: Not on file  Food Insecurity: Not on file  Transportation Needs: Not on file  Physical Activity: Not on file  Stress: Not on file  Social Connections: Not on file  Intimate Partner Violence: Not on file    Family History:    Family History  Problem Relation Age of Onset   Heart failure Mother    Hypertension Mother    CAD Father    Diabetes Father    Prostate cancer Brother      ROS:  Please see the history of present illness.   All other ROS reviewed and negative.     Physical Exam/Data:   Vitals:   10/23/20 1146 10/23/20 1200 10/23/20 1230 10/23/20 1400  BP:  (!) 130/102 132/80  138/77  Pulse:  98 98 95  Resp:   17 20  Temp:   97.9 F (36.6 C)   TempSrc:   Oral   SpO2:  98% 99% 98%  Weight: 87.1 kg     Height: 5\' 11"  (1.803 m)      No intake or  output data in the 24 hours ending 10/23/20 1431 Last 3 Weights 10/23/2020 10/19/2020 10/18/2020  Weight (lbs) 192 lb 192 lb 12.8 oz 196 lb 3.4 oz  Weight (kg) 87.091 kg 87.454 kg 89 kg     Body mass index is 26.78 kg/m.  General:  Well nourished, well developed male appearing in no acute distress HEENT: normal Neck: no JVD Vascular: No carotid bruits; Distal pulses 2+ bilaterally Cardiac:  normal S1, S2; Regular rhythm, tachycardiac rate. Sternal scar noted.  Lungs:  clear to auscultation bilaterally, no wheezing, rhonchi or rales  Abd: soft, nontender, no hepatomegaly  Ext: no pitting edema Musculoskeletal:  No deformities, BUE and BLE strength normal and equal Skin: warm and dry  Neuro:  CNs 2-12 intact, no focal abnormalities noted Psych:  Normal affect   EKG:  The EKG was personally reviewed and demonstrates: NSR, HR 96 with LVH and ST elevation along the anterior leads and TWI along the inferior and precordial lateral leads which is similar to prior tracings.   Relevant CV Studies:  TEE: 08/2020 IMPRESSIONS     1. Left ventricular ejection fraction, by estimation, is 60 to 65%. The  left ventricle has normal function. There is moderate left ventricular  hypertrophy.   2. Right ventricular systolic function is normal. The right ventricular  size is normal.   3. No left atrial/left atrial appendage thrombus was detected.   4. The mitral valve is grossly normal. Mild to moderate mitral valve  regurgitation.   5. There was no mass seen on the Tricuspid valve.   6. The aortic valve is calcified. Aortic valve regurgitation is mild.  Moderate to severe aortic valve stenosis.   Cardiac Catheterization: 09/2020 Ost LAD to Mid LAD lesion is 65% stenosed.   Mid LM to Ost LAD lesion is 35% stenosed with 70%  stenosed side branch in Ramus.   1st LPL lesion is 99% stenosed.   Ost RCA to Prox RCA lesion is 100% stenosed.   1st Mrg lesion is 70% stenosed.   LV end diastolic pressure is normal.   There is moderate aortic valve stenosis.   SUMMARY Multivessel CAD:  Known CTO of nondominant RCA, and now CTO of LPL 1 along  70% ostial stenosis of Ramus Intermedius and 1st Mrg Angiographically mild to moderate disease in the proximal LAD, however, by IVUS 2 tandem lesions of 65% stenosis. Angiographically concerning distal left main lesion that is new.  By IVUS appears to be a potential healed dissection. Normal Right Heart Cath Pressures: PA mean 18 mmHg with a Wedge pressure of 9 mm.  LVEDP of 11 mmHg. Moderate aortic stenosis by pressure measurement: Mean gradient 27 mmHg.   RECOMMENDATIONS With plans for open AVR, would consider the possibility of CABG -grafting LAD, RI as well as potentially branches of the dominant LCx. (Left Main lesion is not significant, but raises the specter of of unstable plaque in the Left Main and proximal LAD)       Laboratory Data:  High Sensitivity Troponin:   Recent Labs  Lab 10/23/20 1238  TROPONINIHS 347*     Chemistry Recent Labs  Lab 10/17/20 0510 10/18/20 0026 10/23/20 1238 10/23/20 1253  NA 136 137 136 136  K 3.5 3.3* 3.7 3.7  CL 98 99 98 99  CO2 28 29 22   --   GLUCOSE 92 69* 138* 136*  BUN 22* 20 19 19   CREATININE 1.05 1.03 1.26* 1.10  CALCIUM 8.5* 8.6* 9.1  --  GFRNONAA >60 >60 >60  --   ANIONGAP 10 9 16*  --     Recent Labs  Lab 10/17/20 0510 10/23/20 1238  PROT 5.6* 6.8  ALBUMIN 2.8* 3.2*  AST 36 25  ALT 14 18  ALKPHOS 47 61  BILITOT 0.7 1.2   Lipids No results for input(s): CHOL, TRIG, HDL, LABVLDL, LDLCALC, CHOLHDL in the last 168 hours.  Hematology Recent Labs  Lab 10/17/20 0510 10/18/20 0026 10/23/20 1238 10/23/20 1253  WBC 16.5* 13.8* 18.1*  --   RBC 3.03* 3.33* 3.58*  --   HGB 8.1* 8.8* 9.5* 10.5*  HCT 24.2*  26.4* 29.7* 31.0*  MCV 79.9* 79.3* 83.0  --   MCH 26.7 26.4 26.5  --   MCHC 33.5 33.3 32.0  --   RDW 19.9* 19.6* 18.3*  --   PLT 151 205 617*  --    Thyroid No results for input(s): TSH, FREET4 in the last 168 hours.  BNPNo results for input(s): BNP, PROBNP in the last 168 hours.  DDimer  Recent Labs  Lab 10/23/20 1238  DDIMER 10.71*     Radiology/Studies:  DG Chest Portable 1 View  Result Date: 10/23/2020 CLINICAL DATA:  Chest pain, diaphoresis, and shortness of breath aortic valve replacement and CABG on 10/14/2020 EXAM: PORTABLE CHEST 1 VIEW COMPARISON:  10/19/2020 FINDINGS: Aortic valve and markers from recent CABG noted. No sternal wire migration. No current cardiomegaly or edema. The lungs appear clear there no blunting of the costophrenic angles. No pneumothorax or obvious radiographic complicating features from recent CABG noted. IMPRESSION: 1. No acute findings. 2. Aortic valve prosthesis and markers from recent CABG. No edema or cardiomegaly currently. Electronically Signed   By: Gaylyn Rong M.D.   On: 10/23/2020 13:03   VAS [place] LOWER EXTREMITY VENOUS (DVT) (ONLY [facility] & [facility] 7a-7p)  Result Date: 10/23/2020  Lower Venous DVT Study Patient Name:  LILTON PARE  Date of Exam:   10/23/2020 Medical Rec #: [id number]            Accession #:    [id number] Date of Birth: 02-18-1960           Patient Gender: M Patient Age:   39 years Exam [facility]  [facility] Procedure:      VAS [place] LOWER EXTREMITY VENOUS (DVT) Referring Phys: Janey Genta NANAVATI --------------------------------------------------------------------------------  Indications: Edema.  Comparison Study: no prior Performing Technologist: [place] Ponder RVS  Examination Guidelines: A complete evaluation includes B-mode imaging, spectral Doppler, color Doppler, and power Doppler as needed of all accessible portions of each vessel. Bilateral testing is considered an integral part of a complete examination. Limited  examinations for reoccurring indications may be performed as noted. The reflux portion of the exam is performed with the patient in reverse Trendelenburg.  +---------+---------------+---------+-----------+----------+--------------+ RIGHT    CompressibilityPhasicitySpontaneityPropertiesThrombus Aging +---------+---------------+---------+-----------+----------+--------------+ CFV      Full           Yes      Yes                                 +---------+---------------+---------+-----------+----------+--------------+ SFJ      Full                                                        +---------+---------------+---------+-----------+----------+--------------+  FV Prox  Full                                                        +---------+---------------+---------+-----------+----------+--------------+ FV Mid   Full                                                        +---------+---------------+---------+-----------+----------+--------------+ FV DistalFull                                                        +---------+---------------+---------+-----------+----------+--------------+ PFV      Full                                                        +---------+---------------+---------+-----------+----------+--------------+ POP      Full           Yes      Yes                                 +---------+---------------+---------+-----------+----------+--------------+ PTV      Full                                                        +---------+---------------+---------+-----------+----------+--------------+ PERO     Full                                                        +---------+---------------+---------+-----------+----------+--------------+   +----+---------------+---------+-----------+----------+--------------+ LEFTCompressibilityPhasicitySpontaneityPropertiesThrombus Aging  +----+---------------+---------+-----------+----------+--------------+ CFV Full           Yes      Yes                                 +----+---------------+---------+-----------+----------+--------------+     Summary: RIGHT: - There is no evidence of deep vein thrombosis in the lower extremity.  - No cystic structure found in the popliteal fossa.  LEFT: - No evidence of common femoral vein obstruction.  *See table(s) above for measurements and observations.    Preliminary      Assessment and Plan:   1. Chest Pain/Dyspnea on Exertion - He developed acute symptoms of weakness, dyspnea, dizziness and chest pain earlier this morning which occurred while walking outside. He did experience intermittent palpitations yesterday which he reports resembles his prior atrial fibrillation during his admission. - It is early in his work-up as his initial high-sensitivity troponin value is  pending but D-dimer was found to be elevated to 10.71. A CTA is pending and the highest suspicion at this time given his acute onset of symptoms, tachycardia, recent surgery and recent postoperative atrial fibrillation would be an acute PE. If negative, would continue to cycle enzymes and follow symptoms. Currently pain-free at this time and breathing is back to baseline.   2. CAD - He is s/p prior stent to RCA in 2010, recent CABG at time of AVR with LIMA-LAD, SVG-RI and seq SVG-OM-distal LCx. - Only experienced chest pain this AM in the setting of his weakness and dyspnea. Hs Troponin values pending and CTA pending. Further recommendations pending results.   3. Severe AS - He is s/p AVR with 25 mm Edwards pericardial valve on 10/14/2020.   4. Post-operative Atrial Fibrillation - He had multiple episodes of atrial fibrillation in the postoperative setting by review of the discharge summary and reports having similar palpitations yesterday which spontaneously resolved.  He is on Lopressor 50 mg twice daily as an  outpatient. - He was not started on anticoagulation following CABG but may need to consider this based off of CTA results or if he is found to have intermittent atrial fibrillation this admission as his CHA2DS2-VASc Score is at least 5 (HTN, DM, Vascular, CVA (2)).  5. HTN - His BP has been at 130/102 - 138/77 within the past 24 hours. Continue PTA Lopressor and Valsartan.    6. HLD - Remains on high-intensity statin therapy with Atorvastatin  daily.    For questions or updates, please contact [facility] Please consult [email or web address] for contact info under    Signed, Ellsworth Lennox, PA-C  10/23/2020 2:31 PM

## 2020-10-23 NOTE — ED Provider Notes (Signed)
[facility] EMERGENCY DEPARTMENT Provider Note   CSN: [id number] Arrival date & time: 10/23/20  1137     History Chief Complaint  Patient presents with   Shortness of Breath    Vincent Guerrero is a 60 y.o. male presenting for evaluation of chest pain and shortness of breath.  Patient states just prior to when he called EMS today, about an hour and a half prior to arrival, he walked down 2 stairs to start his walk, had sudden onset chest discomfort, diaphoresis, nausea, and extreme weakness.  He then developed shortness of breath.  By the time EMS arrived, patient was having significant difficulty breathing.  Symptoms gradually resolved with EMS transport, he did not receive any medications other than oxygen.  He states he is feeling well at this time.  He was recently discharged in the hospital after he had aortic valve repair and CABG x4 on 9-9.  He was discharged 3 days ago, was symptom-free until this morning.  He denies fevers, chills, new cough, abdominal pain, urinary symptoms, normal bowel movements.  He did state that over the past 8 to 12 hours, has has "felt he has been in A. Fib."   Additional history obtained in chart review.  I reviewed recent procedure and hospitalization.  History of anemia, CAD, depression, diabetes, hypertension, hyperlipidemia, stroke.   HPI     Past Medical History:  Diagnosis Date   Age-related nuclear cataract of both eyes 11/27/2018   Anemia    Bilateral recurrent inguinal hernia without obstruction or gangrene 05/09/2016   Added automatically from request for surgery [id number]   Coronary artery disease involving native coronary artery without angina pectoris 05/2016   History of PCI to nondominant RCA. ->  Cath in April 2018 showed occluded nondominant RCA stent.  30% LM.  pLAD 40%.  Ostial RI 60%.   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   Encounter to establish care with new doctor 08/24/2020   Severe aortic stenosis by prior echocardiography 08/24/2020   Nonrheumatic aortic valve insufficiency 08/24/2020   Vitamin D deficiency 07/06/2020   Gastritis 07/06/2020   Shortness of breath 06/02/2020   Wheezing 06/02/2020   Iron deficiency anemia due to chronic blood loss 11/27/2019   Deficiency anemia 11/25/2019   Chest pressure 11/06/2019   Tobacco use 11/06/2019   Tobacco abuse 11/05/2019   Anemia    Inguinal hernia without obstruction or gangrene    Hypertension    Diabetes mellitus (HCC)    Depression    Age-related nuclear cataract of both eyes 11/27/2018   Cortical age-related cataract of both eyes 11/27/2018   Primary open angle glaucoma (POAG) of left eye, severe stage 11/27/2018   Primary open angle glaucoma (POAG) of both eyes, severe stage 11/27/2018   Coronary artery disease involving native heart without angina pectoris    Hyperlipidemia LDL goal <70    Bilateral recurrent inguinal hernia  without obstruction or gangrene 05/09/2016   Stroke (HCC) 05/2016    Past Surgical History:  Procedure Laterality Date   AORTIC VALVE REPLACEMENT N/A 10/14/2020   Procedure: AORTIC VALVE REPLACEMENT (AVR) USING INSPIRIS RESILIA AORTIC VALVE;  Surgeon: Alleen Borne, MD;  Location: [facility] OR;  Service: Open Heart Surgery;  Laterality: N/A;   BIOPSY  11/27/2019   Procedure: BIOPSY;  Surgeon: Sherrilyn Rist, MD;  Location: [facility] ENDOSCOPY;  Service: Gastroenterology;;   COLONOSCOPY WITH PROPOFOL N/A 11/27/2019    Procedure: COLONOSCOPY WITH PROPOFOL;  Surgeon: Sherrilyn Rist, MD;  Location: [facility] ENDOSCOPY;  Service: Gastroenterology;  Laterality: N/A;   CORONARY ARTERY BYPASS GRAFT N/A 10/14/2020   Procedure: CORONARY ARTERY BYPASS GRAFTING (CABG) TIMES FOUR, USING LEFT INTERNAL MAMMARY ARTERY AND RIGHT GREATER SAPHENOUS VEIN HARVESTED ENDOSCOPICALLY;  Surgeon: Alleen Borne, MD;  Location: [facility] OR;  Service: Open Heart Surgery;  Laterality: N/A;   ESOPHAGOGASTRODUODENOSCOPY (EGD) WITH PROPOFOL N/A 11/27/2019   Procedure: ESOPHAGOGASTRODUODENOSCOPY (EGD) WITH PROPOFOL;  Surgeon: Sherrilyn Rist, MD;  Location: [facility] ENDOSCOPY;  Service: Gastroenterology;  Laterality: N/A;   HEMOSTASIS CLIP PLACEMENT  11/27/2019   Procedure: HEMOSTASIS CLIP PLACEMENT;  Surgeon: Sherrilyn Rist, MD;  Location: [facility] ENDOSCOPY;  Service: Gastroenterology;;   HERNIA REPAIR  1998   INTRAVASCULAR ULTRASOUND/IVUS N/A 09/12/2020   Procedure: Intravascular Ultrasound/IVUS;  Surgeon: Marykay Lex, MD;  Location: [facility] INVASIVE CV LAB;  Service: Cardiovascular;  Laterality: N/A;   LEFT HEART CATH AND CORONARY ANGIOGRAPHY N/A 06/04/2016   Procedure: Left Heart Cath and Coronary Angiography;  Surgeon: Lyn Records, MD;  Location: [facility] INVASIVE CV LAB;  Service: Cardiovascular;  Laterality: N/A;   RIGHT/LEFT HEART CATH AND CORONARY ANGIOGRAPHY N/A 09/12/2020   Procedure: RIGHT/LEFT HEART CATH AND CORONARY ANGIOGRAPHY;  Surgeon: Marykay Lex, MD;  Location: [facility] INVASIVE CV LAB;  Service: Cardiovascular;  Laterality: N/A;   TEE WITHOUT CARDIOVERSION N/A 08/30/2020   Procedure: TRANSESOPHAGEAL ECHOCARDIOGRAM (TEE);  Surgeon: Elease Hashimoto Deloris Ping, MD;  Location: [facility] ENDOSCOPY;  Service: Cardiovascular;  Laterality: N/A;   TEE WITHOUT CARDIOVERSION N/A 10/14/2020   Procedure: TRANSESOPHAGEAL ECHOCARDIOGRAM (TEE);  Surgeon: Alleen Borne, MD;  Location: [facility] OR;  Service: Open Heart Surgery;  Laterality: N/A;       Family History  Problem Relation  Age of Onset   Heart failure Mother    Hypertension Mother    CAD Father    Diabetes Father    Prostate cancer Brother     Social History   Tobacco Use   Smoking status: Every Day    Packs/day: 1.00    Years: 40.00    Pack years: 40.00    Types: Cigarettes   Smokeless tobacco: Former    Types: Chew   Tobacco comments:    half pack daily-05/05/2020-AH  Vaping Use   Vaping Use: Never used  Substance Use Topics   Alcohol use: No   Drug use: Yes    Types: Marijuana    Comment: 1-2 times per month    Home Medications Prior to Admission medications   Medication Sig Start Date End Date Taking? Authorizing Provider  acetaminophen (TYLENOL) 500 MG tablet Take 1,000 mg by mouth every 6 (six) hours as needed for headache or mild pain (when not taking BC powders).    [provider]  amiodarone (PACERONE) 400 MG tablet Take 1 tablet (400 mg total) by mouth 2 (two) times daily. For 7 days, then once daily 10/19/20  Rowe Clack, PA-C  aspirin EC 325 MG EC tablet Take 1 tablet (325 mg total) by mouth daily. 10/19/20   Gold, Wayne E, PA-C  atorvastatin (LIPITOR) 80 MG tablet Take 1 tablet (80 mg total) by mouth daily. 10/19/20   Gold, Deniece Portela E, PA-C  cloNIDine (CATAPRES) 0.1 MG tablet Take 0.1 mg by mouth See admin instructions. Take 0.1 mg by mouth one to three times a day as needed for a Systolic reading of 180 or greater    [provider]  ferrous fumarate-b12-vitamic C-folic acid (TRINSICON / FOLTRIN) capsule Take 1 capsule by mouth 2 (two) times daily after a meal. 10/19/20   Gold, Wayne E, PA-C  glipiZIDE (GLUCOTROL) 10 MG tablet Take 10 mg by mouth 2 (two) times daily before a meal.  11/19/18   [provider]  latanoprost (XALATAN) 0.005 % ophthalmic solution Place 1 drop into the right eye at bedtime.    [provider]  metFORMIN (GLUCOPHAGE) 1000 MG tablet Take 1,000 mg by mouth 2 (two) times daily with a meal.    [provider]   metoprolol (LOPRESSOR) 50 MG tablet Take 1 tablet (50 mg total) by mouth 2 (two) times daily. 05/11/16   Janetta Hora, PA-C  Nicotine (NICODERM CQ TD) Place 1 patch onto the skin daily as needed (for smoking cessation).    [provider]  oxyCODONE (OXY IR/ROXICODONE) 5 MG immediate release tablet Take 1 tablet (5 mg total) by mouth every 6 (six) hours as needed for severe pain. 10/19/20   Barrett, Erin R, PA-C  pantoprazole (PROTONIX) 40 MG tablet Take 1 tablet (40 mg total) by mouth daily. Patient taking differently: Take 40 mg by mouth daily before breakfast. 06/02/20   Tobb, Kardie, DO  PATADAY 0.2 % SOLN Place 1 drop into the right eye 2 (two) times daily.    [provider]  Tiotropium Bromide Monohydrate (SPIRIVA RESPIMAT) 2.5 MCG/ACT AERS Inhale 1 puff into the lungs 2 (two) times daily.    [provider]  valsartan (DIOVAN) 40 MG tablet Take 1 tablet (40 mg total) by mouth daily. Patient taking differently: Take 40 mg by mouth 2 (two) times daily. 08/01/20   Tobb, Kardie, DO    Allergies    Cetacaine [butamben-tetracaine-benzocaine]  Review of Systems   Review of Systems  Constitutional:  Positive for diaphoresis.  Respiratory:  Positive for shortness of breath.   Cardiovascular:  Positive for chest pain.  Gastrointestinal:  Positive for nausea.  All other systems reviewed and are negative.  Physical Exam Updated Vital Signs BP 138/77   Pulse 95   Temp 97.9 F (36.6 C) (Oral)   Resp 20   Ht 5\' 11"  (1.803 m)   Wt 87.1 kg   SpO2 98%   BMI 26.78 kg/m   Physical Exam Vitals and nursing note reviewed.  Constitutional:      General: He is not in acute distress.    Appearance: Normal appearance.  HENT:     Head: Normocephalic and atraumatic.  Eyes:     Conjunctiva/sclera: Conjunctivae normal.     Pupils: Pupils are equal, round, and reactive to light.  Cardiovascular:     Rate and Rhythm: Normal rate and regular rhythm.     Pulses:  Normal pulses.  Pulmonary:     Effort: Pulmonary effort is normal. No respiratory distress.     Breath sounds: No wheezing.     Comments: Speaking in full sentences.  Rales that clear  with cough. Large surgical scar without erythema, warmth, or drainage. Abdominal:     General: There is no distension.     Palpations: Abdomen is soft. There is no mass.     Tenderness: There is no abdominal tenderness. There is no guarding or rebound.  Musculoskeletal:        General: Normal range of motion.     Cervical back: Normal range of motion and neck supple.     Left lower leg: Edema present.     Comments: L leg > R leg  Skin:    General: Skin is warm and dry.     Capillary Refill: Capillary refill takes less than 2 seconds.  Neurological:     Mental Status: He is alert and oriented to person, place, and time.  Psychiatric:        Mood and Affect: Mood and affect normal.        Speech: Speech normal.        Behavior: Behavior normal.    ED Results / Procedures / Treatments   Labs (all labs ordered are listed, but only abnormal results are displayed) Labs Reviewed  CBC WITH DIFFERENTIAL/PLATELET - Abnormal; Notable for the following components:      Result Value   WBC 18.1 (*)    RBC 3.58 (*)    Hemoglobin 9.5 (*)    HCT 29.7 (*)    RDW 18.3 (*)    Platelets 617 (*)    Neutro Abs 14.3 (*)    Monocytes Absolute 1.4 (*)    Abs Immature Granulocytes 0.27 (*)    All other components within normal limits  COMPREHENSIVE METABOLIC PANEL - Abnormal; Notable for the following components:   Glucose, Bld 138 (*)    Creatinine, Ser 1.26 (*)    Albumin 3.2 (*)    Anion gap 16 (*)    All other components within normal limits  D-DIMER, QUANTITATIVE - Abnormal; Notable for the following components:   D-Dimer, Quant 10.71 (*)    All other components within normal limits  I-STAT CHEM 8, ED - Abnormal; Notable for the following components:   Glucose, Bld 136 (*)    Calcium, Ion 1.12 (*)     Hemoglobin 10.5 (*)    HCT 31.0 (*)    All other components within normal limits  TROPONIN I (HIGH SENSITIVITY) - Abnormal; Notable for the following components:   Troponin I (High Sensitivity) 347 (*)    All other components within normal limits  RESP PANEL BY RT-PCR (FLU A&B, COVID) ARPGX2  HEPARIN LEVEL (UNFRACTIONATED)  CBG MONITORING, ED  TROPONIN I (HIGH SENSITIVITY)    EKG None  Radiology CT Angio Chest PE W and/or Wo Contrast  Result Date: 10/23/2020 CLINICAL DATA:  Diaphoresis, lightheadedness, chest pain, and shortness of breath. CABG 1 week ago. EXAM: CT ANGIOGRAPHY CHEST WITH CONTRAST TECHNIQUE: Multidetector CT imaging of the chest was performed using the standard protocol during bolus administration of intravenous contrast. Multiplanar CT image reconstructions and MIPs were obtained to evaluate the vascular anatomy. CONTRAST:  73mL OMNIPAQUE IOHEXOL 350 MG/ML SOLN COMPARISON:  Chest x-ray October 23, 2020. FINDINGS: Cardiovascular: The thoracic aorta is normal in caliber with no aneurysm or dissection. No significant atherosclerotic change identified. Cardiomegaly is noted. There appear to be a few small emboli in the left lower lobe. Three pulmonary emboli are identified in the left base. No other emboli identified outside of the left base. Mediastinum/Nodes: Postoperative changes are seen in the mediastinum. No pleural  effusions. No suspicious pericardial effusion. No adenopathy. The esophagus and thyroid are normal. Lungs/Pleura: Bronchial wall thickening in the bases is identified. Central airways are otherwise normal. No pneumothorax. Mild dependent atelectasis. No nodules, masses, or focal infiltrates. Upper Abdomen: Possible sludge or stones in the gallbladder without wall thickening or adjacent stranding. Probable cysts in the liver, too small to characterize. Musculoskeletal: No chest wall abnormality. No acute or significant osseous findings. Review of the MIP images  confirms the above findings. IMPRESSION: 1. Three left lower lobe pulmonary emboli, 1 segmental and 2 subsegmental. 2. Cardiomegaly. 3. Postoperative changes in the mediastinum after CABG 1 week ago. 4. Bronchial wall thickening in the bases, nonspecific. 5. Suggested sludge or stones in the gallbladder without wall thickening. Findings called to the patient's ER physician. Electronically Signed   By: Gerome Sam III M.D.   On: 10/23/2020 15:02   DG Chest Portable 1 View  Result Date: 10/23/2020 CLINICAL DATA:  Chest pain, diaphoresis, and shortness of breath aortic valve replacement and CABG on 10/14/2020 EXAM: PORTABLE CHEST 1 VIEW COMPARISON:  10/19/2020 FINDINGS: Aortic valve and markers from recent CABG noted. No sternal wire migration. No current cardiomegaly or edema. The lungs appear clear there no blunting of the costophrenic angles. No pneumothorax or obvious radiographic complicating features from recent CABG noted. IMPRESSION: 1. No acute findings. 2. Aortic valve prosthesis and markers from recent CABG. No edema or cardiomegaly currently. Electronically Signed   By: Gaylyn Rong M.D.   On: 10/23/2020 13:03   VAS [place] LOWER EXTREMITY VENOUS (DVT) (ONLY [facility] & [facility] 7a-7p)  Result Date: 10/23/2020  Lower Venous DVT Study Patient Name:  LADALE SHERBURN  Date of Exam:   10/23/2020 Medical Rec #: [id number]            Accession #:    [id number] Date of Birth: Jun 20, 1960           Patient Gender: M Patient Age:   84 years Exam Location:  [facility] Procedure:      VAS [place] LOWER EXTREMITY VENOUS (DVT) Referring Phys: Janey Genta NANAVATI --------------------------------------------------------------------------------  Indications: Edema.  Comparison Study: no prior Performing Technologist: [place] Ponder RVS  Examination Guidelines: A complete evaluation includes B-mode imaging, spectral Doppler, color Doppler, and power Doppler as needed of all accessible portions of each vessel. Bilateral  testing is considered an integral part of a complete examination. Limited examinations for reoccurring indications may be performed as noted. The reflux portion of the exam is performed with the patient in reverse Trendelenburg.  +---------+---------------+---------+-----------+----------+--------------+ RIGHT    CompressibilityPhasicitySpontaneityPropertiesThrombus Aging +---------+---------------+---------+-----------+----------+--------------+ CFV      Full           Yes      Yes                                 +---------+---------------+---------+-----------+----------+--------------+ SFJ      Full                                                        +---------+---------------+---------+-----------+----------+--------------+ FV Prox  Full                                                        +---------+---------------+---------+-----------+----------+--------------+  FV Mid   Full                                                        +---------+---------------+---------+-----------+----------+--------------+ FV DistalFull                                                        +---------+---------------+---------+-----------+----------+--------------+ PFV      Full                                                        +---------+---------------+---------+-----------+----------+--------------+ POP      Full           Yes      Yes                                 +---------+---------------+---------+-----------+----------+--------------+ PTV      Full                                                        +---------+---------------+---------+-----------+----------+--------------+ PERO     Full                                                        +---------+---------------+---------+-----------+----------+--------------+   +----+---------------+---------+-----------+----------+--------------+  LEFTCompressibilityPhasicitySpontaneityPropertiesThrombus Aging +----+---------------+---------+-----------+----------+--------------+ CFV Full           Yes      Yes                                 +----+---------------+---------+-----------+----------+--------------+     Summary: RIGHT: - There is no evidence of deep vein thrombosis in the lower extremity.  - No cystic structure found in the popliteal fossa.  LEFT: - No evidence of common femoral vein obstruction.  *See table(s) above for measurements and observations.    Preliminary     Procedures .Critical Care Performed by: Alveria Apley, PA-C Authorized by: Alveria Apley, PA-C   Critical care provider statement:    Critical care time (minutes):  50   Critical care time was exclusive of:  Separately billable procedures and treating other patients and teaching time   Critical care was necessary to treat or prevent imminent or life-threatening deterioration of the following conditions:  Cardiac failure and circulatory failure   Critical care was time spent personally by me on the following activities:  Blood draw for specimens, development of treatment plan with patient or surrogate, discussions with consultants, obtaining history from patient or surrogate, ordering and performing treatments and interventions, ordering and review of laboratory studies, ordering and review  of radiographic studies, pulse oximetry, re-evaluation of patient's condition and review of old charts   I assumed direction of critical care for this patient from another provider in my specialty: no     Care discussed with: admitting provider     Medications Ordered in ED Medications  heparin bolus via infusion 5,000 Units (has no administration in time range)  heparin ADULT infusion 100 units/mL (25000 units/224mL) (has no administration in time range)  iohexol (OMNIPAQUE) 350 MG/ML injection 75 mL (75 mLs Intravenous Contrast Given 10/23/20 1418)     ED Course  I have reviewed the triage vital signs and the nursing notes.  Pertinent labs & imaging results that were available during my care of the patient were reviewed by me and considered in my medical decision making (see chart for details).    MDM Rules/Calculators/A&P                           Patient presenting for evaluation of chest pain, shortness of breath, diaphoresis, nausea.  This began with exertion this morning.  He recently had aortic valve replacement and CABG x4.  On arrival to the ER, he is chest pain-free.  Will obtain labs, EKG, chest x-ray and continue to monitor.  Will consult cardiology.  Chest x-ray viewed and independently interpreted by me, no pneumonia, pneumothorax, effusion.  Labs show stable electrolytes and hemoglobin.  Troponin elevated at 347, while this may be due to recent surgery, also consider ACS.  D-dimer is also elevated at 10, CTA pending.  CTA shows 3 small subsegmental PEs.  In the setting of recent surgery, patient will need heparin and admission.  Cardiology to continue to follow.  Medicine to admit.  Discussed with family medicine who accepts the patient for admission.   Final Clinical Impression(s) / ED Diagnoses Final diagnoses:  None    Rx / DC Orders ED Discharge Orders     None        Alveria Apley, PA-C 10/23/20 1537    Derwood Kaplan, MD 10/25/20 [phone]

## 2020-10-23 NOTE — ED Notes (Signed)
Pt reports feeling "funny in his chest like before." Pts Hr 150, EKG captured and MD notified.

## 2020-10-23 NOTE — Progress Notes (Signed)
Lower extremity venous has been completed.   Preliminary results in CV Proc.   Ajee Heasley Joniya Boberg 10/23/2020 1:23 PM

## 2020-10-23 NOTE — Progress Notes (Signed)
FPTS Brief Progress Note  S: Sleeping soundly in bed.    O: BP (!) 148/76   Pulse (!) 103   Temp 98.4 F (36.9 C) (Oral)   Resp (!) 21   Ht 5\' 10"  (1.778 m)   Wt 84.1 kg   SpO2 92%   BMI 26.62 kg/m   General: Sleeping, no acute distress. Age appropriate. Respiratory: normal effort   A/P: 1. Multiple subsegmental pulmonary emboli without acute cor pulmonale (HCC)  2. Elevated troponin  - Orders reviewed. Labs for AM ordered, which was adjusted as needed.  - If condition changes, plan includes re consulting with cardiology.   , DO 10/23/2020, 10:22 PM PGY-3, [facility] Night Resident  Please page [phone] with questions.

## 2020-10-23 NOTE — Progress Notes (Signed)
ANTICOAGULATION CONSULT NOTE - Follow Up Consult  Pharmacy Consult for heparin Indication: pulmonary embolus   Labs: Recent Labs    10/23/20 1238 10/23/20 1253 10/23/20 1457 10/23/20 2200  HGB 9.5* 10.5*  --   --   HCT 29.7* 31.0*  --   --   PLT 617*  --   --   --   HEPARINUNFRC  --   --   --  0.36  CREATININE 1.26* 1.10  --   --   TROPONINIHS 347*  --  278*  --     Assessment: 59yo male therapeutic on heparin with initial dosing for PE though at low end of goal; no infusion issues or signs of bleeding per RN.  Goal of Therapy:  Heparin level 0.3-0.7 units/ml   Plan:  Will increase heparin infusion slightly to 1500 units/hr and check level with am labs.    Vernard Gambles, PharmD, BCPS  10/23/2020,11:13 PM

## 2020-10-23 NOTE — ED Notes (Signed)
Patient transported to CT 

## 2020-10-23 NOTE — Progress Notes (Signed)
ANTICOAGULATION CONSULT NOTE - Initial Consult  Pharmacy Consult for heparin Indication: pulmonary embolus  Allergies  Allergen Reactions   Cetacaine [Butamben-Tetracaine-Benzocaine] Shortness Of Breath and Other (See Comments)    Intense laryngospasm    Patient Measurements: Height: 5\' 11"  (180.3 cm) Weight: 87.1 kg (192 lb) IBW/kg (Calculated) : 75.3  Vital Signs: Temp: 97.9 F (36.6 C) (09/18 1230) Temp Source: Oral (09/18 1230) BP: 138/77 (09/18 1400) Pulse Rate: 95 (09/18 1400)  Labs: Recent Labs    10/23/20 1238 10/23/20 1253  HGB 9.5* 10.5*  HCT 29.7* 31.0*  PLT 617*  --   CREATININE 1.26* 1.10  TROPONINIHS 347*  --     Estimated Creatinine Clearance: 77 mL/min (by C-G formula based on SCr of 1.1 mg/dL).   Medical History: Past Medical History:  Diagnosis Date   Age-related nuclear cataract of both eyes 11/27/2018   Anemia    Bilateral recurrent inguinal hernia without obstruction or gangrene 05/09/2016   Added automatically from request for surgery 07/09/2016   Coronary artery disease involving native coronary artery without angina pectoris 05/2016   History of PCI to nondominant RCA. ->  Cath in April 2018 showed occluded nondominant RCA stent.  30% LM.  pLAD 40%.  Ostial RI 60%.   Cortical age-related cataract of both eyes 11/27/2018   Depression    Diabetes mellitus (HCC)    Hyperlipidemia LDL goal <70    Hypertension    Inguinal hernia without obstruction or gangrene    BILATERAL   Primary open angle glaucoma (POAG) of both eyes, severe stage 11/27/2018   Primary open angle glaucoma (POAG) of left eye, severe stage 11/27/2018   Stroke [facility])    Tobacco abuse 11/05/2019   Tobacco use 11/06/2019    Medications:  (Not in a hospital admission)   Assessment: 55 YOM with chest pain and SOB, found to have LLL PE, 1 segmental and 2 subsegmental PE's. Pharmacy consulted to start IV heparin.   H/H low, Plt elevated. SCr wnl  Goal of Therapy:   Heparin level 0.3-0.7 units/ml Monitor platelets by anticoagulation protocol: Yes   Plan:  Heparin 5000 units/hr IV bolus followed by heparin infusion at 1450 units/hr F/u 6 hr HL Monitor daily HL, CBC and s/s of bleeding   46, PharmD., BCPS, BCCCP Clinical Pharmacist Please refer to [facility] for unit-specific pharmacist

## 2020-10-23 NOTE — ED Triage Notes (Addendum)
PT from home via [facility]. Pt reports walking outside this morning and becoming diaphoretic, lightheaded, chest pain, and shortness of breath. Pt reports CABG 1 week ago.

## 2020-10-23 NOTE — H&P (Addendum)
Family Medicine Teaching [facility] Admission History and Physical Service Pager: [phone]  Patient name: Vincent Guerrero Medical record number: [id number] Date of birth: February 28, 1960 Age: 60 y.o. Gender: male  Primary Care Provider: Pcp, No Consultants: Cardiology Code Status: no chest compressions, ok with intubation Preferred Emergency Contact: Verlon Au, daughter, and Ivar Drape, [phone], brother  Chief Complaint: dyspnea, chest pain  Assessment and Plan: Vincent Guerrero is a 60 y.o. male presenting with shortness of breath and chest pain . PMH is significant for severe AR (s/p AVR), CAD (s/p stent of RCA in 201, and recent CABGx4 during AVR), prior CVA, HTN, HLD, T2DM, Tobacco Use.  Chest pain with dyspnea on exertion  multiple segmental pulmonary emboli Patient presented to the ED with history of chest pain and shortness of breath.  He received an AVR and CABG x4 1 week ago on 9/09.  He was discharged on 9/15.  He felt good after surgery, but then 3 days ago started having some chest pains and palpitations and thought he might of been going into A. fib for the past 14 hours.  He was walking to his car today to go to his brother's house and he began to feel sweaty and short of breath.  Upon EMS arrival he felt as if he could not breathe, and improved with oxygen.  He feels the reason why he got this way was because he could not pick up one of his medications for atrial fibrillation although he is not sure which medication it was (amiodarone per daughter). Patient had pain, swelling, bruising from catheterization on right calf. In the ED he was saturating 98% on room air, and remained stable with his vitals.  For labs, electrolytes were normal, troponins trended down from 347 to 278, and white blood cell count was up at 18.1.  ANC was elevated to 14.3.  Hemoglobin was 9.5 and 10.5.  D-dimer of 10.71.  EKG sinus rhythm with rate of high 90s and similar to previous reads with ST  elevation in anterior leads and LVH. This x-ray showed no edema or cardiomegaly. CTA showed 3 left lower lobe pulmonary emboli, 1 segmental, and 2 subsegmental. LE DVT showed no evidence of DVT or femoral vein obstruction. Respiratory panel negative. On exam patient had no increased work of breathing and was saturating well on room air. Did have some left-sided chest pain that he describes as dull. Incision from recent surgery CDI and no swelling or drainage. Cardiology had been consulted first and ordered him a heparin drip. Pulses were in the 90s while in the room. Ddx for shortness of breath and chest pain likely to be related to multifocal PE. Has elevated WBC, but per cardiology unlikely to be premature graft failure given downward trending troponins.  -Admit to FPTS, attending Dr. Leveda Anna -cardiology following, appreciate recommendations -f/u Echo for any R heart strain, valve issues, wall abnormalities -heparin drip for PE -heparin level daily -AM CBC, BMP -continuous pulse oximetry  -cardiac monitoring -vitals per floor protocol -O2 keep sat 88-92%, oxygen prn -oxycodone 5 mg q6h prn (home med) -acetaminophen 650 mg q6h prn -Up with assistance, will add PT/OT when able -Regular diet -Echo  Postoperative atrial fibrillation Had episode of afib with rates to 150 and subsequently decreased to 130s hour after we saw patient initially. EKG with Afib confirmed by cardiology. Cardiology consulted and recommendations below. Has remained stable. No anticoagulation currently, may need long term anticoagulation per cardiology. -Metoprolol 50 mg BID -amiodarone 400 mg BID -  monitor clinically  CAD Has had prior stent to RCA in 2010 recent CABGx4 during his AVR. Troponins down trended -Cardiology following, appreciate recommendations -aspirin 325 daily -atorvastatin  Severe AS Received AVR on 10/14/2020 -Stable, continue monitoring  Hypertension BP has been 130s-150s/90s. Home medications  include clonidine, metoprolol, valsartan -metoprolol 50 mg BID  Hyperlipidemia Continue home atorvastatin  T2DM Hgb A1c of 6.5 Home meds include metformin, glipizide. -Monitor CBGs -sSSI  Tobacco Use Disorder -Nicotine patch  GERD -Home Protonix 40 mg continue  Constipation -miralax 17 g daily  FEN/GI: Regular Prophylaxis: heparin drip  Disposition: Home pending clinical improvement  History of Present Illness:  Vincent Guerrero is a 60 y.o. male presenting with dyspnea, chest pain, and diaphoresis. Patient recently released from hospital after AVR and CABG x 4 about 3 days ago. This morning he started having some chest pains and palpitations, though he might be having a fib. He was walking to his car to go to his brother's house and he felt sweaty, dyspneic, and had some chest pain. When the EMS arrived, he felt like he couldn't breathe and was very scared. EMS gave him [place] O2 and that helped relieve some of his distress.   He believes that he couldn't get one of his medications for a fib. He's noticed mild swelling of his right lower leg since his operation.   Review Of Systems: Per HPI with the following additions:   Review of Systems  Constitutional:  Positive for activity change, diaphoresis and fatigue. Negative for fever.  HENT:  Negative for congestion, rhinorrhea and sore throat.   Respiratory:  Positive for chest tightness and shortness of breath. Negative for wheezing.   Gastrointestinal:  Negative for abdominal pain.  Psychiatric/Behavioral:  Negative for agitation.     Patient Active Problem List   Diagnosis Date Noted   Pulmonary embolism (HCC) 10/23/2020   S/P AVR 10/15/2020   S/P CABG x 4 10/14/2020   Encounter to establish care with new doctor 08/24/2020   Severe aortic stenosis by prior echocardiography 08/24/2020   Nonrheumatic aortic valve insufficiency 08/24/2020   Vitamin D deficiency 07/06/2020   Gastritis 07/06/2020   Shortness of breath  06/02/2020   Wheezing 06/02/2020   Iron deficiency anemia due to chronic blood loss 11/27/2019   Deficiency anemia 11/25/2019   Chest pressure 11/06/2019   Tobacco use 11/06/2019   Tobacco abuse 11/05/2019   Anemia    Inguinal hernia without obstruction or gangrene    Hypertension    Diabetes mellitus (HCC)    Depression    Age-related nuclear cataract of both eyes 11/27/2018   Cortical age-related cataract of both eyes 11/27/2018   Primary open angle glaucoma (POAG) of left eye, severe stage 11/27/2018   Primary open angle glaucoma (POAG) of both eyes, severe stage 11/27/2018   Coronary artery disease involving native heart without angina pectoris    Hyperlipidemia LDL goal <70    Bilateral recurrent inguinal hernia without obstruction or gangrene 05/09/2016   Stroke (HCC) 05/2016    Past Medical History: Past Medical History:  Diagnosis Date   Age-related nuclear cataract of both eyes 11/27/2018   Anemia    Bilateral recurrent inguinal hernia without obstruction or gangrene 05/09/2016   Added automatically from request for surgery [id number]   Coronary artery disease involving native coronary artery without angina pectoris 05/2016   History of PCI to nondominant RCA. ->  Cath in April 2018 showed occluded nondominant RCA stent.  30% LM.  pLAD 40%.  Ostial RI 60%.   Cortical age-related cataract of both eyes 11/27/2018   Depression    Diabetes mellitus (HCC)    Hyperlipidemia LDL goal <70    Hypertension    Inguinal hernia without obstruction or gangrene    BILATERAL   Primary open angle glaucoma (POAG) of both eyes, severe stage 11/27/2018   Primary open angle glaucoma (POAG) of left eye, severe stage 11/27/2018   Stroke (HCC)    Tobacco abuse 11/05/2019   Tobacco use 11/06/2019    Past Surgical History: Past Surgical History:  Procedure Laterality Date   AORTIC VALVE REPLACEMENT N/A 10/14/2020   Procedure: AORTIC VALVE REPLACEMENT (AVR) USING INSPIRIS RESILIA  AORTIC VALVE;  Surgeon: Alleen Borne, MD;  Location: [facility] OR;  Service: Open Heart Surgery;  Laterality: N/A;   BIOPSY  11/27/2019   Procedure: BIOPSY;  Surgeon: Sherrilyn Rist, MD;  Location: [facility] ENDOSCOPY;  Service: Gastroenterology;;   COLONOSCOPY WITH PROPOFOL N/A 11/27/2019   Procedure: COLONOSCOPY WITH PROPOFOL;  Surgeon: Sherrilyn Rist, MD;  Location: [facility] ENDOSCOPY;  Service: Gastroenterology;  Laterality: N/A;   CORONARY ARTERY BYPASS GRAFT N/A 10/14/2020   Procedure: CORONARY ARTERY BYPASS GRAFTING (CABG) TIMES FOUR, USING LEFT INTERNAL MAMMARY ARTERY AND RIGHT GREATER SAPHENOUS VEIN HARVESTED ENDOSCOPICALLY;  Surgeon: Alleen Borne, MD;  Location: [facility] OR;  Service: Open Heart Surgery;  Laterality: N/A;   ESOPHAGOGASTRODUODENOSCOPY (EGD) WITH PROPOFOL N/A 11/27/2019   Procedure: ESOPHAGOGASTRODUODENOSCOPY (EGD) WITH PROPOFOL;  Surgeon: Sherrilyn Rist, MD;  Location: [facility] ENDOSCOPY;  Service: Gastroenterology;  Laterality: N/A;   HEMOSTASIS CLIP PLACEMENT  11/27/2019   Procedure: HEMOSTASIS CLIP PLACEMENT;  Surgeon: Sherrilyn Rist, MD;  Location: [facility] ENDOSCOPY;  Service: Gastroenterology;;   HERNIA REPAIR  1998   INTRAVASCULAR ULTRASOUND/IVUS N/A 09/12/2020   Procedure: Intravascular Ultrasound/IVUS;  Surgeon: Marykay Lex, MD;  Location: [facility] INVASIVE CV LAB;  Service: Cardiovascular;  Laterality: N/A;   LEFT HEART CATH AND CORONARY ANGIOGRAPHY N/A 06/04/2016   Procedure: Left Heart Cath and Coronary Angiography;  Surgeon: Lyn Records, MD;  Location: [facility] INVASIVE CV LAB;  Service: Cardiovascular;  Laterality: N/A;   RIGHT/LEFT HEART CATH AND CORONARY ANGIOGRAPHY N/A 09/12/2020   Procedure: RIGHT/LEFT HEART CATH AND CORONARY ANGIOGRAPHY;  Surgeon: Marykay Lex, MD;  Location: [facility] INVASIVE CV LAB;  Service: Cardiovascular;  Laterality: N/A;   TEE WITHOUT CARDIOVERSION N/A 08/30/2020   Procedure: TRANSESOPHAGEAL ECHOCARDIOGRAM (TEE);  Surgeon: Elease Hashimoto Deloris Ping, MD;  Location: [facility]  ENDOSCOPY;  Service: Cardiovascular;  Laterality: N/A;   TEE WITHOUT CARDIOVERSION N/A 10/14/2020   Procedure: TRANSESOPHAGEAL ECHOCARDIOGRAM (TEE);  Surgeon: Alleen Borne, MD;  Location: [facility] OR;  Service: Open Heart Surgery;  Laterality: N/A;    Social History: Social History   Tobacco Use   Smoking status: Every Day    Packs/day: 1.00    Years: 40.00    Pack years: 40.00    Types: Cigarettes   Smokeless tobacco: Former    Types: Chew   Tobacco comments:    half pack daily-05/05/2020-AH  Vaping Use   Vaping Use: Never used  Substance Use Topics   Alcohol use: No   Drug use: Yes    Types: Marijuana    Comment: 1-2 times per month   Additional social history:   Please also refer to relevant sections of EMR.  Family History: Family History  Problem Relation Age of Onset   Heart failure Mother    Hypertension Mother  CAD Father    Diabetes Father    Prostate cancer Brother    Allergies and Medications: Allergies  Allergen Reactions   Cetacaine [Butamben-Tetracaine-Benzocaine] Shortness Of Breath and Other (See Comments)    Intense laryngospasm   No current facility-administered medications on file prior to encounter.   Current Outpatient Medications on File Prior to Encounter  Medication Sig Dispense Refill   acetaminophen (TYLENOL) 500 MG tablet Take 1,000 mg by mouth every 6 (six) hours as needed for headache or mild pain (when not taking BC powders).     amiodarone (PACERONE) 400 MG tablet Take 1 tablet (400 mg total) by mouth 2 (two) times daily. For 7 days, then once daily 37 tablet 1   aspirin EC 325 MG EC tablet Take 1 tablet (325 mg total) by mouth daily.     atorvastatin (LIPITOR) 80 MG tablet Take 1 tablet (80 mg total) by mouth daily. 30 tablet 1   cloNIDine (CATAPRES) 0.1 MG tablet Take 0.1 mg by mouth See admin instructions. Take 0.1 mg by mouth one to three times a day as needed for a Systolic reading of 180 or greater     ferrous fumarate-b12-vitamic  C-folic acid (TRINSICON / FOLTRIN) capsule Take 1 capsule by mouth 2 (two) times daily after a meal. 60 capsule 1   glipiZIDE (GLUCOTROL) 10 MG tablet Take 10 mg by mouth 2 (two) times daily before a meal.      latanoprost (XALATAN) 0.005 % ophthalmic solution Place 1 drop into the right eye at bedtime.     metFORMIN (GLUCOPHAGE) 1000 MG tablet Take 1,000 mg by mouth 2 (two) times daily with a meal.     metoprolol (LOPRESSOR) 50 MG tablet Take 1 tablet (50 mg total) by mouth 2 (two) times daily. 180 tablet 2   Nicotine (NICODERM CQ TD) Place 1 patch onto the skin daily as needed (for smoking cessation).     oxyCODONE (OXY IR/ROXICODONE) 5 MG immediate release tablet Take 1 tablet (5 mg total) by mouth every 6 (six) hours as needed for severe pain. 28 tablet 0   pantoprazole (PROTONIX) 40 MG tablet Take 1 tablet (40 mg total) by mouth daily. (Patient taking differently: Take 40 mg by mouth daily before breakfast.) 90 tablet 3   PATADAY 0.2 % SOLN Place 1 drop into the right eye 2 (two) times daily.     Tiotropium Bromide Monohydrate (SPIRIVA RESPIMAT) 2.5 MCG/ACT AERS Inhale 1 puff into the lungs 2 (two) times daily.     valsartan (DIOVAN) 40 MG tablet Take 1 tablet (40 mg total) by mouth daily. (Patient taking differently: Take 40 mg by mouth 2 (two) times daily.) 90 tablet 3    Objective: BP 130/81   Pulse 92   Temp 97.9 F (36.6 C) (Oral)   Resp (!) 21   Ht  (1.803 m)   Wt 87.1 kg   SpO2 99%   BMI 26.78 kg/m  Exam: General: age- appropriate, AAM, NAD, resting comfortably in bed, mildly overweight Eyes: Follows movement Neck: Good ROM Cardiovascular: NSR, tachycardic, sternal scar CDI no fluctuance or active drainage Respiratory: no iWOB, chest rises symmetrically, no wheezes, rales bilaterally Gastrointestinal: Nontender to palpation, nondistended MSK: Left leg edema on exam non pitting, bruising present from ankle to calf, likely 2/2 to CABG Neuro: alert and oriented Psych:  normal mood  Labs and Imaging: CBC BMET  Recent Labs  Lab 10/23/20 1238 10/23/20 1253  WBC 18.1*  --   HGB 9.5*  10.5*  HCT 29.7* 31.0*  PLT 617*  --    Recent Labs  Lab 10/23/20 1238 10/23/20 1253  NA 136 136  K 3.7 3.7  CL 98 99  CO2 22  --   BUN 19 19  CREATININE 1.26* 1.10  GLUCOSE 138* 136*  CALCIUM 9.1  --      EKG: My own interpretation (not copied from electronic read) NSR   CT Angio Chest PE W and/or Wo Contrast  Result Date: 10/23/2020 CLINICAL DATA:  Diaphoresis, lightheadedness, chest pain, and shortness of breath. CABG 1 week ago. EXAM: CT ANGIOGRAPHY CHEST WITH CONTRAST TECHNIQUE: Multidetector CT imaging of the chest was performed using the standard protocol during bolus administration of intravenous contrast. Multiplanar CT image reconstructions and MIPs were obtained to evaluate the vascular anatomy. CONTRAST:  56mL OMNIPAQUE IOHEXOL 350 MG/ML SOLN COMPARISON:  Chest x-ray October 23, 2020. FINDINGS: Cardiovascular: The thoracic aorta is normal in caliber with no aneurysm or dissection. No significant atherosclerotic change identified. Cardiomegaly is noted. There appear to be a few small emboli in the left lower lobe. Three pulmonary emboli are identified in the left base. No other emboli identified outside of the left base. Mediastinum/Nodes: Postoperative changes are seen in the mediastinum. No pleural effusions. No suspicious pericardial effusion. No adenopathy. The esophagus and thyroid are normal. Lungs/Pleura: Bronchial wall thickening in the bases is identified. Central airways are otherwise normal. No pneumothorax. Mild dependent atelectasis. No nodules, masses, or focal infiltrates. Upper Abdomen: Possible sludge or stones in the gallbladder without wall thickening or adjacent stranding. Probable cysts in the liver, too small to characterize. Musculoskeletal: No chest wall abnormality. No acute or significant osseous findings. Review of the MIP images  confirms the above findings. IMPRESSION: 1. Three left lower lobe pulmonary emboli, 1 segmental and 2 subsegmental. 2. Cardiomegaly. 3. Postoperative changes in the mediastinum after CABG 1 week ago. 4. Bronchial wall thickening in the bases, nonspecific. 5. Suggested sludge or stones in the gallbladder without wall thickening. Findings called to the patient's ER physician. Electronically Signed   By: Gerome Sam III M.D.   On: 10/23/2020 15:02   DG Chest Portable 1 View  Result Date: 10/23/2020 CLINICAL DATA:  Chest pain, diaphoresis, and shortness of breath aortic valve replacement and CABG on 10/14/2020 EXAM: PORTABLE CHEST 1 VIEW COMPARISON:  10/19/2020 FINDINGS: Aortic valve and markers from recent CABG noted. No sternal wire migration. No current cardiomegaly or edema. The lungs appear clear there no blunting of the costophrenic angles. No pneumothorax or obvious radiographic complicating features from recent CABG noted. IMPRESSION: 1. No acute findings. 2. Aortic valve prosthesis and markers from recent CABG. No edema or cardiomegaly currently. Electronically Signed   By: Gaylyn Rong M.D.   On: 10/23/2020 13:03   VAS [place] LOWER EXTREMITY VENOUS (DVT) (ONLY [facility] & [facility] 7a-7p)  Result Date: 10/23/2020  Lower Venous DVT Study Patient Name:  TOSHIYUKI FREDELL  Date of Exam:   10/23/2020 Medical Rec #: [id number]            Accession #:    [id number] Date of Birth: 1960/08/02           Patient Gender: M Patient Age:   74 years Exam Location:  [facility] Procedure:      VAS [place] LOWER EXTREMITY VENOUS (DVT) Referring Phys: Janey Genta NANAVATI --------------------------------------------------------------------------------  Indications: Edema.  Comparison Study: no prior Performing Technologist: [place] Ponder RVS  Examination Guidelines: A complete evaluation includes B-mode imaging, spectral  Doppler, color Doppler, and power Doppler as needed of all accessible portions of each vessel. Bilateral  testing is considered an integral part of a complete examination. Limited examinations for reoccurring indications may be performed as noted. The reflux portion of the exam is performed with the patient in reverse Trendelenburg.  +---------+---------------+---------+-----------+----------+--------------+ RIGHT    CompressibilityPhasicitySpontaneityPropertiesThrombus Aging +---------+---------------+---------+-----------+----------+--------------+ CFV      Full           Yes      Yes                                 +---------+---------------+---------+-----------+----------+--------------+ SFJ      Full                                                        +---------+---------------+---------+-----------+----------+--------------+ FV Prox  Full                                                        +---------+---------------+---------+-----------+----------+--------------+ FV Mid   Full                                                        +---------+---------------+---------+-----------+----------+--------------+ FV DistalFull                                                        +---------+---------------+---------+-----------+----------+--------------+ PFV      Full                                                        +---------+---------------+---------+-----------+----------+--------------+ POP      Full           Yes      Yes                                 +---------+---------------+---------+-----------+----------+--------------+ PTV      Full                                                        +---------+---------------+---------+-----------+----------+--------------+ PERO     Full                                                        +---------+---------------+---------+-----------+----------+--------------+   +----+---------------+---------+-----------+----------+--------------+  LEFTCompressibilityPhasicitySpontaneityPropertiesThrombus Aging +----+---------------+---------+-----------+----------+--------------+ CFV Full           Yes      Yes                                 +----+---------------+---------+-----------+----------+--------------+     Summary: RIGHT: - There is no evidence of deep vein thrombosis in the lower extremity.  - No cystic structure found in the popliteal fossa.  LEFT: - No evidence of common femoral vein obstruction.  *See table(s) above for measurements and observations.    Preliminary       Levin Erp, MD 10/23/2020, 7:44 PM PGY-1, [facility] FPTS Intern pager: [phone], text pages welcome  FPTS Upper-Level Resident Addendum   I have independently interviewed and examined the patient. I have discussed the above with the original author and agree with their documentation. I have made additional edits as necessary. Please see also any attending notes.   Shirlean Mylar, M.D. PGY-3, [facility] 10/23/2020 8:32 PM  FPTS Service pager: [phone] (text pages welcome through [facility])

## 2020-10-24 ENCOUNTER — Encounter: Payer: Self-pay | Admitting: Hematology and Oncology

## 2020-10-24 ENCOUNTER — Inpatient Hospital Stay (HOSPITAL_COMMUNITY): Payer: Medicaid Other

## 2020-10-24 ENCOUNTER — Other Ambulatory Visit: Payer: Self-pay

## 2020-10-24 ENCOUNTER — Other Ambulatory Visit (HOSPITAL_COMMUNITY): Payer: Self-pay

## 2020-10-24 ENCOUNTER — Encounter (HOSPITAL_COMMUNITY): Payer: Self-pay | Admitting: Family Medicine

## 2020-10-24 DIAGNOSIS — Z952 Presence of prosthetic heart valve: Secondary | ICD-10-CM

## 2020-10-24 DIAGNOSIS — R778 Other specified abnormalities of plasma proteins: Secondary | ICD-10-CM

## 2020-10-24 DIAGNOSIS — I480 Paroxysmal atrial fibrillation: Secondary | ICD-10-CM

## 2020-10-24 DIAGNOSIS — I2694 Multiple subsegmental pulmonary emboli without acute cor pulmonale: Secondary | ICD-10-CM

## 2020-10-24 DIAGNOSIS — E1165 Type 2 diabetes mellitus with hyperglycemia: Secondary | ICD-10-CM

## 2020-10-24 DIAGNOSIS — Z951 Presence of aortocoronary bypass graft: Secondary | ICD-10-CM

## 2020-10-24 LAB — CBC
HCT: 27 % — ABNORMAL LOW (ref 39.0–52.0)
Hemoglobin: 8.6 g/dL — ABNORMAL LOW (ref 13.0–17.0)
MCH: 26.1 pg (ref 26.0–34.0)
MCHC: 31.9 g/dL (ref 30.0–36.0)
MCV: 82.1 fL (ref 80.0–100.0)
Platelets: 618 10*3/uL — ABNORMAL HIGH (ref 150–400)
RBC: 3.29 MIL/uL — ABNORMAL LOW (ref 4.22–5.81)
RDW: 17.9 % — ABNORMAL HIGH (ref 11.5–15.5)
WBC: 16.6 10*3/uL — ABNORMAL HIGH (ref 4.0–10.5)
nRBC: 0 % (ref 0.0–0.2)

## 2020-10-24 LAB — ECHOCARDIOGRAM COMPLETE
AR max vel: 2.4 cm2
AV Area VTI: 2.2 cm2
AV Area mean vel: 2.09 cm2
AV Mean grad: 9 mmHg
AV Peak grad: 17.5 mmHg
Ao pk vel: 2.09 m/s
Area-P 1/2: 3.01 cm2
Height: 70 in
S' Lateral: 3.2 cm
Weight: 2948.8 oz

## 2020-10-24 LAB — GLUCOSE, CAPILLARY
Glucose-Capillary: 222 mg/dL — ABNORMAL HIGH (ref 70–99)
Glucose-Capillary: 183 mg/dL — ABNORMAL HIGH (ref 70–99)
Glucose-Capillary: 213 mg/dL — ABNORMAL HIGH (ref 70–99)
Glucose-Capillary: 177 mg/dL — ABNORMAL HIGH (ref 70–99)

## 2020-10-24 LAB — HEPARIN LEVEL (UNFRACTIONATED)
Heparin Unfractionated: 0.43 IU/mL (ref 0.30–0.70)
Heparin Unfractionated: 0.53 IU/mL (ref 0.30–0.70)

## 2020-10-24 LAB — BASIC METABOLIC PANEL
Anion gap: 11 (ref 5–15)
BUN: 18 mg/dL (ref 6–20)
CO2: 25 mmol/L (ref 22–32)
Calcium: 8.6 mg/dL — ABNORMAL LOW (ref 8.9–10.3)
Chloride: 98 mmol/L (ref 98–111)
Creatinine, Ser: 1.24 mg/dL (ref 0.61–1.24)
GFR, Estimated: >60 mL/min (ref >60)
Glucose, Bld: 290 mg/dL — ABNORMAL HIGH (ref 70–99)
Potassium: 3.3 mmol/L — ABNORMAL LOW (ref 3.5–5.1)
Sodium: 134 mmol/L — ABNORMAL LOW (ref 135–145)

## 2020-10-24 MED ORDER — INSULIN ASPART 100 UNIT/ML IJ SOLN
0.0000 [IU] | Freq: Three times a day (TID) | INTRAMUSCULAR | Status: DC
  Administered 2020-10-24: 5 [IU] via SUBCUTANEOUS
  Administered 2020-10-25 (×2): 3 [IU] via SUBCUTANEOUS

## 2020-10-24 MED ORDER — METFORMIN HCL 500 MG PO TABS: 1000.0000 mg | ORAL_TABLET | Freq: Two times a day (BID) | ORAL | Status: DC

## 2020-10-24 MED ORDER — POTASSIUM CHLORIDE 20 MEQ PO PACK
40.0000 meq | PACK | Freq: Once | ORAL | Status: AC
  Administered 2020-10-24: 40 meq via ORAL
  Filled 2020-10-24: qty 2

## 2020-10-24 NOTE — Evaluation (Signed)
Physical Therapy Evaluation & Discharge Patient Details Name: Vincent Guerrero MRN: [id number] DOB: Mar 01, 1960 Today's Date: 10/24/2020  History of Present Illness  Pt is a 60 y.o. male admitted 10/23/20 with chest pain, SOB. Found to have multiple acute PEs. PMH includes severe AR s/p AVR, CAD s/p CABG 10/14/20 , CVA, HTN, DM2, HLD, tobacco use.   Clinical Impression  Patient evaluated by Physical Therapy with no further acute PT needs identified. PTA, pt independent with intermittent use of SPC; reports he has been staying active since recent discharge home post-CABG. Today, pt independent with mobility; good awareness of sternal precautions. Reviewed educ re: activity recommendations, DVT prevention. All education has been completed and the patient has no further questions. Acute PT is signing off. Thank you for this referral.       Recommendations for follow up therapy are one component of a multi-disciplinary discharge planning process, led by the attending physician.  Recommendations may be updated based on patient status, additional functional criteria and insurance authorization.  Follow Up Recommendations No PT follow up    Equipment Recommendations  None recommended by PT    Recommendations for Other Services       Precautions / Restrictions Precautions Precautions: Sternal Precaution Comments: recent CABG 10/14/20      Mobility  Bed Mobility Overal bed mobility: Independent                  Transfers Overall transfer level: Independent Equipment used: None                Ambulation/Gait Ambulation/Gait assistance: Independent Educational psychologist (Feet): 350 Feet Assistive device: None;IV Pole Gait Pattern/deviations: WFL(Within Functional Limits)   Gait velocity interpretation: >2.62 ft/sec, indicative of community ambulatory General Gait Details: Steady ambulation with and without pushing IV pole; no overt instability or LOB noted  Stairs             Wheelchair Mobility    Modified Rankin (Stroke Patients Only)       Balance Overall balance assessment: No apparent balance deficits (not formally assessed)                                           Pertinent Vitals/Pain Pain Assessment: No/denies pain    Home Living Family/patient expects to be discharged to:: Private residence Living Arrangements: Alone Available Help at Discharge: Family;Available PRN/intermittently Type of Home: House Home Access: Stairs to enter Entrance Stairs-Rails: None Entrance Stairs-Number of Steps: 4 Home Layout: One level Home Equipment: Cane - single point Additional Comments: Has been staying with family since recent discharge post-CABG; hopeful to return to his own home soon    Prior Function Level of Independence: Independent         Comments: Typically independent without DME; intermittent use of SPC secondary to chronic LE pain     Hand Dominance        Extremity/Trunk Assessment   Upper Extremity Assessment Upper Extremity Assessment: Overall WFL for tasks assessed    Lower Extremity Assessment Lower Extremity Assessment: Overall WFL for tasks assessed       Communication   Communication: No difficulties  Cognition Arousal/Alertness: Awake/alert Behavior During Therapy: WFL for tasks assessed/performed Overall Cognitive Status: Within Functional Limits for tasks assessed  General Comments General comments (skin integrity, edema, etc.): Reviewed educ re: sternal precautions, activity recommendations, DVT prevention    Exercises     Assessment/Plan    PT Assessment Patent does not need any further PT services  PT Problem List         PT Treatment Interventions      PT Goals (Current goals can be found in the Care Plan section)  Acute Rehab PT Goals PT Goal Formulation: All assessment and education complete, DC therapy     Frequency     Barriers to discharge        Co-evaluation               AM-PAC PT "6 Clicks" Mobility  Outcome Measure Help needed turning from your back to your side while in a flat bed without using bedrails?: None Help needed moving from lying on your back to sitting on the side of a flat bed without using bedrails?: None Help needed moving to and from a bed to a chair (including a wheelchair)?: None Help needed standing up from a chair using your arms (e.g., wheelchair or bedside chair)?: None Help needed to walk in hospital room?: None Help needed climbing 3-5 steps with a railing? : None 6 Click Score: 24    End of Session Equipment Utilized During Treatment: Gait belt Activity Tolerance: Patient tolerated treatment well Patient left: in bed;with call bell/phone within reach Nurse Communication: Mobility status PT Visit Diagnosis: Other abnormalities of gait and mobility (R26.89)    Time: 9476-5465 PT Time Calculation (min) (ACUTE ONLY): 20 min   Charges:   PT Evaluation $PT Eval Low Complexity: 1 Low        Ina Homes, PT, DPT Acute Rehabilitation Services  Pager [phone] Office [phone]  Malachy Chamber 10/24/2020, 4:48 PM

## 2020-10-24 NOTE — Plan of Care (Signed)

## 2020-10-24 NOTE — TOC Benefit Eligibility Note (Signed)
Patient Product/process development scientist completed.    The patient is currently admitted and upon discharge could be taking Eliquis 5 mg.  The current 30 day co-pay is, $4.00.   The patient is insured through [facility] [place]    Roland Earl, CPhT Pharmacy Patient Advocate Specialist [facility] Antimicrobial Stewardship Team Direct Number: [phone]  Fax: [phone]

## 2020-10-24 NOTE — Progress Notes (Signed)
Inpatient Diabetes Program Recommendations  AACE/ADA: New Consensus Statement on Inpatient Glycemic Control (2015)  Target Ranges:  Prepandial:   less than 140 mg/dL      Peak postprandial:   less than 180 mg/dL (1-2 hours)      Critically ill patients:  140 - 180 mg/dL   Lab Results  Component Value Date   GLUCAP 222 (H) 10/24/2020   HGBA1C 6.5 (H) 10/23/2020   Results for MORDCHE, HEDGLIN (MRN [id number]) as of 10/24/2020 09:53  Ref. Range 10/23/2020 17:12 10/23/2020 18:53 10/23/2020 21:09 10/24/2020 08:36  Glucose-Capillary Latest Ref Range: 70 - 99 mg/dL 409 (H) 811 (H) 914 (H) 222 (H)   Review of Glycemic Control  Diabetes history: DM 2 Outpatient Diabetes medications: Glipizide 10 mg bid, Metformin 1000 mg bid Current orders for Inpatient glycemic control:  Novolog 0-9 units tid  Inpatient Diabetes Program Recommendations:    Pt takes 2 oral meds at home. Glucose trends above inpatient goals.  -  Increase Novolog to "moderate scale" 0-15 units tid  -  Add Novolog hs scale   Thanks,  Christena Deem RN, MSN, BC-ADM Inpatient Diabetes Coordinator Team Pager [phone] (8a-5p)

## 2020-10-24 NOTE — Progress Notes (Signed)
  Echocardiogram 2D Echocardiogram has been performed.  Vincent Guerrero 10/24/2020, 2:47 PM

## 2020-10-24 NOTE — Progress Notes (Addendum)
ANTICOAGULATION CONSULT NOTE - Follow Up Consult  Pharmacy Consult for Heparin Indication: pulmonary embolus  Allergies  Allergen Reactions   Cetacaine [Butamben-Tetracaine-Benzocaine] Shortness Of Breath and Other (See Comments)    Intense laryngospasm    Patient Measurements: Height: 5\' 10"  (177.8 cm) Weight: 83.6 kg (184 lb 4.8 oz) IBW/kg (Calculated) : 73 Heparin Dosing Weight: 83.6 kg  Vital Signs: Temp: 98.9 F (37.2 C) (09/19 0831) Temp Source: Oral (09/19 0831) BP: 119/75 (09/19 0831) Pulse Rate: 87 (09/19 0831)  Labs: Recent Labs    10/23/20 1238 10/23/20 1253 10/23/20 1457 10/23/20 2200 10/24/20 0321  HGB 9.5* 10.5*  --   --  8.6*  HCT 29.7* 31.0*  --   --  27.0*  PLT 617*  --   --   --  618*  HEPARINUNFRC  --   --   --  0.36 0.43  CREATININE 1.26* 1.10  --   --  1.24  TROPONINIHS 347*  --  278*  --   --     Estimated Creatinine Clearance: 66.2 mL/min (by C-G formula based on SCr of 1.24 mg/dL).   Medications:  Scheduled:   amiodarone  400 mg Oral BID   aspirin  325 mg Oral Daily   atorvastatin  80 mg Oral Daily   insulin aspart  0-9 Units Subcutaneous TID WC   metoprolol tartrate  50 mg Oral BID   nicotine  14 mg Transdermal Daily   pantoprazole  40 mg Oral QAC breakfast   umeclidinium bromide  1 puff Inhalation Daily   Infusions:   heparin 1,500 Units/hr (10/24/20 10/26/20)    Assessment: 59yo male therapeutic on heparin with initial dosing for PE though at low end of goal for the first and second heparin levels (0.36 and 0.43). No infusion issues or signs of bleeding. Since he is still low end of goal, increase the rate slightly to appropriately treat active PE.  Addendum 9/19 1720: Next heparin level is 0.53 and within therapeutic range. Continue current rate of 1550 units/hr and check levels daily.  Goal of Therapy:  Heparin level 0.3-0.7 units/ml Monitor platelets by anticoagulation protocol: Yes   Plan:  Increase heparin infusion to  1550 units/hr Check anti-Xa level in 6 hours and then daily while on heparin Continue to monitor H&H and platelets   10/19, PharmD PGY1 Pharmacy Resident  Please check AMION for all [facility] pharmacy phone numbers After 10:00 PM call main pharmacy [phone]

## 2020-10-24 NOTE — Progress Notes (Signed)
Family Medicine Teaching Service Daily Progress Note Intern Pager: [phone]  Patient name: Vincent Guerrero Medical record number: [id number] Date of birth: 1960-07-16 Age: 60 y.o. Gender: male  Primary Care Provider: Pcp, No Consultants: Cardiology Code Status: Ok with intubation, no chest compression  Pt Overview and Major Events to Date:  9/18 admitted for pulmonary embolism  Assessment and Plan: 60 yo male presenting with SOB and chest pain. PMH significant for severe AR (s/p AVR), CAD (s/p stents of RCA in 2019, and recent CABG during AVR), prior CVA, hypertension, hyperlipidemia type 2 diabetes mellitus, tobacco use.  Chest pain with dyspnea on exertion  multiple segmental pulmonary emboli This morning patient had no chest pain or shortness of breath saturating well on room air.  White blood cell count is downtrending 16.6 from 18.1 yesterday.  -Heparin drip -AM CBC, BMP -Continuous pulse ox keep saturations 88 to 92% -Vitals per floor protocol -Oxycodone 5 mg every 6 hours as needed -Acetaminophen 650 mg every 6 hours as needed -Up with assistance, add PT/OT -Regular diet -Follow-up echo  Hypokalemia Potassium 3.3 goal above 4 given cardiac history. -Replete  Paroxysmal atrial fibrillation/SVT on telemetry Heart rates have been 90s to 100 morning, telemetry showed SVT for 3 minutes on telemetry. May need long-term anticoagulation per cardiology at discharge -Metoprolol 50 mg twice daily -Amiodarone 400 mg twice daily  -monitor clinically  CAD Has had prior stent to RCA in 2010, recent CABG x4 during AVR.  Troponins down trended. -Cardiology following, appreciate recommendations -Aspirin 325 daily -Atorvastatin  Severe AS Received AVR on 10/14/2020 -Stable, continue monitoring -look at Echo  Hypertension Blood pressures have been 112-159/71-102 and 24 hours.  Home medications include prn clonidine, metoprolol, valsartan -Metoprolol 50 mg twice  daily  Hyperlipidemia -Continue home atorvastatin  Type 2 diabetes mellitus Hemoglobin A1c is 6.5.  CBGs have been 136-290 in the past 24 hours. Says he's eating less -Monitor CBGs -change to mSSI -restart home metformin  Tobacco use -Nicotine patch  GERD -Home Protonix 40 mg  Constipation -MiraLAX 17 g daily  FEN/GI: Regular PPx: Heparin drip Dispo: Home pending clinical improvement   Subjective:  No chest pain or shortness of breath. Did have SVT for about 3 minutes on telemetry this AM which returned to 90s after.  Objective: Temp:  [97.9 F (36.6 C)-98.4 F (36.9 C)] 98.4 F (36.9 C) (09/18 2016) Pulse Rate:  [92-131] 103 (09/18 2115) Resp:  [16-21] 21 (09/18 1915) BP: (112-159)/(71-102) 112/75 (09/18 2311) SpO2:  [92 %-99 %] 92 % (09/18 2016) Weight:  [84.1 kg-87.1 kg] 84.1 kg (09/18 2016) Physical Exam: General: NAD Cardiovascular: NSR, rate 90s Respiratory: no iWOB, chest rises symmetrically Abdomen: nondistended, nontender to palpation Extremities: right leg with swelling, some bruising similar to yesterday, tender to palpation near incision site  Laboratory: Recent Labs  Lab 10/18/20 0026 10/23/20 1238 10/23/20 1253 10/24/20 0321  WBC 13.8* 18.1*  --  16.6*  HGB 8.8* 9.5* 10.5* 8.6*  HCT 26.4* 29.7* 31.0* 27.0*  PLT 205 617*  --  618*   Recent Labs  Lab 10/18/20 0026 10/23/20 1238 10/23/20 1253 10/24/20 0321  NA 137 136 136 134*  K 3.3* 3.7 3.7 3.3*  CL 99 98 99 98  CO2 29 22  --  25  BUN 20 19 19 18   CREATININE 1.03 1.26* 1.10 1.24  CALCIUM 8.6* 9.1  --  8.6*  PROT  --  6.8  --   --   BILITOT  --  1.2  --   --  ALKPHOS  --  61  --   --   ALT  --  18  --   --   AST  --  25  --   --   GLUCOSE 69* 138* 136* 290*      Imaging/Diagnostic Tests: CT Angio Chest PE W and/or Wo Contrast  Result Date: 10/23/2020 CLINICAL DATA:  Diaphoresis, lightheadedness, chest pain, and shortness of breath. CABG 1 week ago. EXAM: CT ANGIOGRAPHY  CHEST WITH CONTRAST TECHNIQUE: Multidetector CT imaging of the chest was performed using the standard protocol during bolus administration of intravenous contrast. Multiplanar CT image reconstructions and MIPs were obtained to evaluate the vascular anatomy. CONTRAST:  16mL OMNIPAQUE IOHEXOL 350 MG/ML SOLN COMPARISON:  Chest x-ray October 23, 2020. FINDINGS: Cardiovascular: The thoracic aorta is normal in caliber with no aneurysm or dissection. No significant atherosclerotic change identified. Cardiomegaly is noted. There appear to be a few small emboli in the left lower lobe. Three pulmonary emboli are identified in the left base. No other emboli identified outside of the left base. Mediastinum/Nodes: Postoperative changes are seen in the mediastinum. No pleural effusions. No suspicious pericardial effusion. No adenopathy. The esophagus and thyroid are normal. Lungs/Pleura: Bronchial wall thickening in the bases is identified. Central airways are otherwise normal. No pneumothorax. Mild dependent atelectasis. No nodules, masses, or focal infiltrates. Upper Abdomen: Possible sludge or stones in the gallbladder without wall thickening or adjacent stranding. Probable cysts in the liver, too small to characterize. Musculoskeletal: No chest wall abnormality. No acute or significant osseous findings. Review of the MIP images confirms the above findings. IMPRESSION: 1. Three left lower lobe pulmonary emboli, 1 segmental and 2 subsegmental. 2. Cardiomegaly. 3. Postoperative changes in the mediastinum after CABG 1 week ago. 4. Bronchial wall thickening in the bases, nonspecific. 5. Suggested sludge or stones in the gallbladder without wall thickening. Findings called to the patient's ER physician. Electronically Signed   By: Gerome Sam III M.D.   On: 10/23/2020 15:02   DG Chest Portable 1 View  Result Date: 10/23/2020 CLINICAL DATA:  Chest pain, diaphoresis, and shortness of breath aortic valve replacement and CABG  on 10/14/2020 EXAM: PORTABLE CHEST 1 VIEW COMPARISON:  10/19/2020 FINDINGS: Aortic valve and markers from recent CABG noted. No sternal wire migration. No current cardiomegaly or edema. The lungs appear clear there no blunting of the costophrenic angles. No pneumothorax or obvious radiographic complicating features from recent CABG noted. IMPRESSION: 1. No acute findings. 2. Aortic valve prosthesis and markers from recent CABG. No edema or cardiomegaly currently. Electronically Signed   By: Gaylyn Rong M.D.   On: 10/23/2020 13:03   VAS [place] LOWER EXTREMITY VENOUS (DVT) (ONLY [facility] & [facility] 7a-7p)  Result Date: 10/23/2020  Lower Venous DVT Study Patient Name:  CONNERY SHIFFLER  Date of Exam:   10/23/2020 Medical Rec #: [id number]            Accession #:    [id number] Date of Birth: Nov 24, 1960           Patient Gender: M Patient Age:   63 years Exam Location:  [facility] Procedure:      VAS [place] LOWER EXTREMITY VENOUS (DVT) Referring Phys: Janey Genta NANAVATI --------------------------------------------------------------------------------  Indications: Edema.  Comparison Study: no prior Performing Technologist: [place] Ponder RVS  Examination Guidelines: A complete evaluation includes B-mode imaging, spectral Doppler, color Doppler, and power Doppler as needed of all accessible portions of each vessel. Bilateral testing is considered an integral part of a  complete examination. Limited examinations for reoccurring indications may be performed as noted. The reflux portion of the exam is performed with the patient in reverse Trendelenburg.  +---------+---------------+---------+-----------+----------+--------------+ RIGHT    CompressibilityPhasicitySpontaneityPropertiesThrombus Aging +---------+---------------+---------+-----------+----------+--------------+ CFV      Full           Yes      Yes                                 +---------+---------------+---------+-----------+----------+--------------+  SFJ      Full                                                        +---------+---------------+---------+-----------+----------+--------------+ FV Prox  Full                                                        +---------+---------------+---------+-----------+----------+--------------+ FV Mid   Full                                                        +---------+---------------+---------+-----------+----------+--------------+ FV DistalFull                                                        +---------+---------------+---------+-----------+----------+--------------+ PFV      Full                                                        +---------+---------------+---------+-----------+----------+--------------+ POP      Full           Yes      Yes                                 +---------+---------------+---------+-----------+----------+--------------+ PTV      Full                                                        +---------+---------------+---------+-----------+----------+--------------+ PERO     Full                                                        +---------+---------------+---------+-----------+----------+--------------+   +----+---------------+---------+-----------+----------+--------------+ LEFTCompressibilityPhasicitySpontaneityPropertiesThrombus Aging +----+---------------+---------+-----------+----------+--------------+ CFV Full           Yes  Yes                                 +----+---------------+---------+-----------+----------+--------------+     Summary: RIGHT: - There is no evidence of deep vein thrombosis in the lower extremity.  - No cystic structure found in the popliteal fossa.  LEFT: - No evidence of common femoral vein obstruction.  *See table(s) above for measurements and observations.    Preliminary      Levin Erp, MD 10/24/2020, 6:14 AM PGY-1, [facility] Family Medicine FPTS Intern pager:  [phone], text pages welcome

## 2020-10-24 NOTE — Hospital Course (Addendum)
Vincent Guerrero is a 59 year old male who presented to the ED with shortness of breath on exertion and chest pain.  Past medical history significant for severe aortic regurgitation status post aortic valve replacement, CAD status post stents of RCA in 2019 and recent CABG x4 during AVR, prior CVA, hypertension, hyperlipidemia, type 2 diabetes mellitus, tobacco use.  Hospital course below by problem:  Chest pain with dyspnea on exertion  multiple segmental pulmonary emboli Patient received AVR and CABGx4 1 week ago on 9/9.  He was discharged on 9/15, and then started having chest pains and palpitations and the day of admission felt like he could not breathe when EMS arrived.  He improved with oxygen in route.  Patient has also had pain swelling and bruising from catheterization on right calf.  In the ED he was saturating well on room air had a D-dimer of 10.71, EKG with previous reads of ST elevation in anterior leads and LVH, and x-ray that showed no edema or cardiomegaly.  Lower extremity DVT ultrasound did not show any DVT or femoral vein obstruction.  CTA showed 3 left lower pulmonary emboli, 1 segmental and 2 subsegmental.  Cardiology was consulted and started on heparin drip for PE.  Troponins trended down from 347-278.  White blood cell count was elevated at 18.1, but unlikely that it was a graft failure, incision on sternotomy looked clean dry and intact throughout hospital stay without any swelling.  Echo was similar to previous echo that he had, aortic valve replacement was intact, no aortic valve regurgitation and EF 50-55%. No significant right heart strain. He was treated with heparin drip and then prior to discharge was transitioned to Eliquis. He was walking without shortness of breath and no oxygen requirements.  Did not have any chest pain on discharge.  Postoperative atrial fibrillation Patient had episodes of atrial fibrillation with rates to 150s that subsequently decreased to 130s.  EKG  during this time did confirm A. fib.  He was controlled on 50 mg twice daily metoprolol, and amiodarone 400 mg twice daily.  At discharge had no other subsequent A. fib episodes on telemetry.  We will continue amiodarone 10/26/2020 twice daily and then plan to decrease to 400 mg once daily on 10/27/2020.  CAD Had prior stent to RCA in 2010, and recent CABG x4 during AVR.  Continued on aspirin 325 and atorvastatin daily.  Severe AS Received AVR on 10/14/2020.  Echo showed normal valve movement and no aortic valve regurgitation.  Hypertension No hypertensive emergencies or urgency is, continued on metoprolol 50 mg twice daily in hospital.  Hyperlipidemia Continued on home atorvastatin  Type 2 diabetes mellitus Had hemoglobin A1c of 6.5 in the hospital. He was transitioned to moderate sliding scale insulin and CBGs were monitored.   Tobacco use disorder Nicotine patch was offered as needed  GERD Home Protonix was continue  Constipation MiraLAX was given daily  Issues for follow up: 1. Patient transitioned from heparin to eliquis prior to discharge, will need to continue on eliquis for 6 months and reassess if he has any A fib continued may not need long term anticoagulation. 2. Continue Amiodarone 400mg  twice daily through 10/26/2020 and then plan was to decrease to 400mg  once daily on 10/27/2020 3. Switched him from home glipizide to jardiance for T2DM 4. Follow up with cardiologist Dr. 5. Ok to decrease to 81 mg aspirin daily per cardiology with conconmitant Eliquis use

## 2020-10-24 NOTE — Progress Notes (Signed)
Progress Note  Patient Name: Vincent Guerrero Date of Encounter: 10/24/2020  [facility] Cardiologist: Lavona Mound Tobb, DO   Subjective   No significant overnight events. Doing much better this morning. Up brushing his teeth. Denies any chest pain, shortness of breath, or dizziness. Weakness has improved.  Inpatient Medications    Scheduled Meds:  amiodarone  400 mg Oral BID   aspirin  325 mg Oral Daily   atorvastatin  80 mg Oral Daily   insulin aspart  0-9 Units Subcutaneous TID WC   metoprolol tartrate  50 mg Oral BID   nicotine  14 mg Transdermal Daily   pantoprazole  40 mg Oral QAC breakfast   umeclidinium bromide  1 puff Inhalation Daily   Continuous Infusions:  heparin 1,500 Units/hr (10/24/20 0626)   PRN Meds: acetaminophen **OR** acetaminophen, albuterol, oxyCODONE, polyethylene glycol   Vital Signs    Vitals:   10/23/20 2141 10/23/20 2211 10/23/20 2241 10/23/20 2311  BP: (!) 144/80 116/73 130/71 112/75  Pulse:      Resp:      Temp:      TempSrc:      SpO2:      Weight:      Height:        Intake/Output Summary (Last 24 hours) at 10/24/2020 0642 Last data filed at 10/24/2020 0509 Gross per 24 hour  Intake 466.17 ml  Output 275 ml  Net 191.17 ml   Last 3 Weights 10/23/2020 10/23/2020 10/19/2020  Weight (lbs) 185 lb 8 oz 192 lb 192 lb 12.8 oz  Weight (kg) 84.142 kg 87.091 kg 87.454 kg      Telemetry    Sinus rhythm with rates in the 70s to 90s overnight and up to the low 100s to 110s this morning with more activity. - Personally Reviewed  ECG    No new ECG tracing today. - Personally Reviewed  Physical Exam   GEN: No acute distress.   Neck: No JVD. Cardiac: Slightly tachycardic with normal rhythm. No murmurs, rubs, or gallops. Radial pulses 2+ and equal. Respiratory: No increased work of breathing. Rhonchi initially noted bilaterally but cleared with coughing. No wheezes or rales. GI: Soft, non-distended, and non-tender. MS: No lower  extremity edema. No deformity. Skin: Warm and dry. Neuro:  No focal deficits. Psych: Normal affect. Responds appropriately.  Labs    High Sensitivity Troponin:   Recent Labs  Lab 10/23/20 1238 10/23/20 1457  TROPONINIHS 347* 278*     Chemistry Recent Labs  Lab 10/18/20 0026 10/23/20 1238 10/23/20 1253 10/24/20 0321  NA 137 136 136 134*  K 3.3* 3.7 3.7 3.3*  CL 99 98 99 98  CO2 29 22  --  25  GLUCOSE 69* 138* 136* 290*  BUN 20 19 19 18   CREATININE 1.03 1.26* 1.10 1.24  CALCIUM 8.6* 9.1  --  8.6*  PROT  --  6.8  --   --   ALBUMIN  --  3.2*  --   --   AST  --  25  --   --   ALT  --  18  --   --   ALKPHOS  --  61  --   --   BILITOT  --  1.2  --   --   GFRNONAA >60 >60  --  >60  ANIONGAP 9 16*  --  11    Lipids No results for input(s): CHOL, TRIG, HDL, LABVLDL, LDLCALC, CHOLHDL in the last 168 hours.  Hematology  Recent Labs  Lab 10/18/20 0026 10/23/20 1238 10/23/20 1253 10/24/20 0321  WBC 13.8* 18.1*  --  16.6*  RBC 3.33* 3.58*  --  3.29*  HGB 8.8* 9.5* 10.5* 8.6*  HCT 26.4* 29.7* 31.0* 27.0*  MCV 79.3* 83.0  --  82.1  MCH 26.4 26.5  --  26.1  MCHC 33.3 32.0  --  31.9  RDW 19.6* 18.3*  --  17.9*  PLT 205 617*  --  618*   Thyroid No results for input(s): TSH, FREET4 in the last 168 hours.  BNP Recent Labs  Lab 10/23/20 1823  BNP 168.0*    DDimer  Recent Labs  Lab 10/23/20 1238  DDIMER 10.71*     Radiology    CT Angio Chest PE W and/or Wo Contrast  Result Date: 10/23/2020 CLINICAL DATA:  Diaphoresis, lightheadedness, chest pain, and shortness of breath. CABG 1 week ago. EXAM: CT ANGIOGRAPHY CHEST WITH CONTRAST TECHNIQUE: Multidetector CT imaging of the chest was performed using the standard protocol during bolus administration of intravenous contrast. Multiplanar CT image reconstructions and MIPs were obtained to evaluate the vascular anatomy. CONTRAST:  48mL OMNIPAQUE IOHEXOL 350 MG/ML SOLN COMPARISON:  Chest x-ray October 23, 2020. FINDINGS:  Cardiovascular: The thoracic aorta is normal in caliber with no aneurysm or dissection. No significant atherosclerotic change identified. Cardiomegaly is noted. There appear to be a few small emboli in the left lower lobe. Three pulmonary emboli are identified in the left base. No other emboli identified outside of the left base. Mediastinum/Nodes: Postoperative changes are seen in the mediastinum. No pleural effusions. No suspicious pericardial effusion. No adenopathy. The esophagus and thyroid are normal. Lungs/Pleura: Bronchial wall thickening in the bases is identified. Central airways are otherwise normal. No pneumothorax. Mild dependent atelectasis. No nodules, masses, or focal infiltrates. Upper Abdomen: Possible sludge or stones in the gallbladder without wall thickening or adjacent stranding. Probable cysts in the liver, too small to characterize. Musculoskeletal: No chest wall abnormality. No acute or significant osseous findings. Review of the MIP images confirms the above findings. IMPRESSION: 1. Three left lower lobe pulmonary emboli, 1 segmental and 2 subsegmental. 2. Cardiomegaly. 3. Postoperative changes in the mediastinum after CABG 1 week ago. 4. Bronchial wall thickening in the bases, nonspecific. 5. Suggested sludge or stones in the gallbladder without wall thickening. Findings called to the patient's ER physician. Electronically Signed   By: Gerome Sam III M.D.   On: 10/23/2020 15:02   DG Chest Portable 1 View  Result Date: 10/23/2020 CLINICAL DATA:  Chest pain, diaphoresis, and shortness of breath aortic valve replacement and CABG on 10/14/2020 EXAM: PORTABLE CHEST 1 VIEW COMPARISON:  10/19/2020 FINDINGS: Aortic valve and markers from recent CABG noted. No sternal wire migration. No current cardiomegaly or edema. The lungs appear clear there no blunting of the costophrenic angles. No pneumothorax or obvious radiographic complicating features from recent CABG noted. IMPRESSION: 1. No  acute findings. 2. Aortic valve prosthesis and markers from recent CABG. No edema or cardiomegaly currently. Electronically Signed   By: Gaylyn Rong M.D.   On: 10/23/2020 13:03   VAS [place] LOWER EXTREMITY VENOUS (DVT) (ONLY [facility] & [facility] 7a-7p)  Result Date: 10/23/2020  Lower Venous DVT Study Patient Name:  HANEEF HALLQUIST  Date of Exam:   10/23/2020 Medical Rec #: [id number]            Accession #:    [id number] Date of Birth: 10/01/1960  Patient Gender: M Patient Age:   60 years Exam Location:  [facility] Procedure:      VAS [place] LOWER EXTREMITY VENOUS (DVT) Referring Phys: ANKIT NANAVATI --------------------------------------------------------------------------------  Indications: Edema.  Comparison Study: no prior Performing Technologist: [place] Ponder RVS  Examination Guidelines: A complete evaluation includes B-mode imaging, spectral Doppler, color Doppler, and power Doppler as needed of all accessible portions of each vessel. Bilateral testing is considered an integral part of a complete examination. Limited examinations for reoccurring indications may be performed as noted. The reflux portion of the exam is performed with the patient in reverse Trendelenburg.  +---------+---------------+---------+-----------+----------+--------------+ RIGHT    CompressibilityPhasicitySpontaneityPropertiesThrombus Aging +---------+---------------+---------+-----------+----------+--------------+ CFV      Full           Yes      Yes                                 +---------+---------------+---------+-----------+----------+--------------+ SFJ      Full                                                        +---------+---------------+---------+-----------+----------+--------------+ FV Prox  Full                                                        +---------+---------------+---------+-----------+----------+--------------+ FV Mid   Full                                                         +---------+---------------+---------+-----------+----------+--------------+ FV DistalFull                                                        +---------+---------------+---------+-----------+----------+--------------+ PFV      Full                                                        +---------+---------------+---------+-----------+----------+--------------+ POP      Full           Yes      Yes                                 +---------+---------------+---------+-----------+----------+--------------+ PTV      Full                                                        +---------+---------------+---------+-----------+----------+--------------+ PERO  Full                                                        +---------+---------------+---------+-----------+----------+--------------+   +----+---------------+---------+-----------+----------+--------------+ LEFTCompressibilityPhasicitySpontaneityPropertiesThrombus Aging +----+---------------+---------+-----------+----------+--------------+ CFV Full           Yes      Yes                                 +----+---------------+---------+-----------+----------+--------------+     Summary: RIGHT: - There is no evidence of deep vein thrombosis in the lower extremity.  - No cystic structure found in the popliteal fossa.  LEFT: - No evidence of common femoral vein obstruction.  *See table(s) above for measurements and observations.    Preliminary     Cardiac Studies   TEE 08/30/2020: Impressions:  1. Left ventricular ejection fraction, by estimation, is 60 to 65%. The  left ventricle has normal function. There is moderate left ventricular  hypertrophy.   2. Right ventricular systolic function is normal. The right ventricular  size is normal.   3. No left atrial/left atrial appendage thrombus was detected.   4. The mitral valve is grossly normal. Mild to moderate mitral valve  regurgitation.    5. There was no mass seen on the Tricuspid valve.   6. The aortic valve is calcified. Aortic valve regurgitation is mild.  Moderate to severe aortic valve stenosis.  _______________  Right/Left Cardiac Catheterization 09/12/2020:   Ost LAD to Mid LAD lesion is 65% stenosed.   Mid LM to Ost LAD lesion is 35% stenosed with 70% stenosed side branch in Ramus.   1st LPL lesion is 99% stenosed.   Ost RCA to Prox RCA lesion is 100% stenosed.   1st Mrg lesion is 70% stenosed.   LV end diastolic pressure is normal.   There is moderate aortic valve stenosis.   Summary: Multivessel CAD:  Known CTO of nondominant RCA, and now CTO of LPL 1 along  70% ostial stenosis of Ramus Intermedius and 1st Mrg Angiographically mild to moderate disease in the proximal LAD, however, by IVUS 2 tandem lesions of 65% stenosis. Angiographically concerning distal left main lesion that is new.  By IVUS appears to be a potential healed dissection. Normal Right Heart Cath Pressures: PA mean 18 mmHg with a Wedge pressure of 9 mm.  LVEDP of 11 mmHg. Moderate aortic stenosis by pressure measurement: Mean gradient 27 mmHg.   Recommendations: With plans for open AVR, would consider the possibility of CABG -grafting LAD, RI as well as potentially branches of the dominant LCx. (Left Main lesion is not significant, but raises the specter of of unstable plaque in the Left Main and proximal LAD) _______________  Chest CTA 10/23/2020: Impressions: 1. Three left lower lobe pulmonary emboli, 1 segmental and 2 subsegmental. 2. Cardiomegaly. 3. Postoperative changes in the mediastinum after CABG 1 week ago. 4. Bronchial wall thickening in the bases, nonspecific. 5. Suggested sludge or stones in the gallbladder without wall thickening.   Findings called to the patient's ER physician.  Patient Profile     60 y.o. male with a history of CAD s/p prior stenting to RCA in 2010 and recent CABG x4 (LIMA-LAD, SVG-RI, and sequential  SVG-OM-distal LCX) on 10/14/2020, severe aortic stenosis s/p bioprosthetic  aortic valve replacement at time of CABG on 10/14/2020, prior CVA, hypertension, hyperlipidemia, type 2 diabetes mellitus, and tobacco use who presented on 10/23/2020 with chest pain and acute dyspnea. Found to have PE.  Assessment & Plan    Chest Pain Dyspnea on Exertion Acute PE - Patient presented with acute symptoms of weakness, dyspnea, dizziness, and chest pain. - EKG showed showed sinus rhythm with LVH and repolarization changes. - High-sensitivity troponin elevated but downtrending at 347 >> 278. - D-Dimer elevated at 10.71. Chest CTA showed 3 left lower lobe, 1 segmental, and 2 subsegmental PE. - Started on IV Heparin. - Symptoms felt to be out of proportion for degree of PE; however, possible that he had a larger PE that broke into small emboli. Not felt to be due to premature graft failure. Will repeat Echo to evaluate for any new wall motion abnormalities, new valve problems, and right heart strain. If unremarkable, do not anticipate repeat cath.  CAD s/p Recent CABG - S/p emote stenting to RCA in 2010 and recent CABG x4 on 10/14/2020. - Continue aspirin, beta-blocker, and high-intensity statin.  Severe Aortic Stenosis s/p Recent AVR - S/p recent bioprosthetic AVR on 10/14/2020. - Will reassess valve with repeat Echo.  Post-Op Atrial Fibrillation - Maintaining sinus rhythm. - Continue Amiodarone 400mg  twice daily through 10/26/2020 and then plan was to decrease to 400mg  once daily. - Currently on Lopressor 50mg  twice daily. Mildly elevated rates may be due to PE but consider increasing to 75mg  twice daily. - Was not started on anticoagulation after CABG. Has now been started on IV Heparin given new diagnosis of PE. Will need to be transitioned to DOAC prior to discharge.  Hypertension - BP mostly well controlled. - Continue Lopressor as above.  Hyperlipidemia - Continue Lipitor 80mg  daily.  Type 2 Diabetes  Mellitus - Management per primary team.    For questions or updates, please contact [facility] Please consult [email or web address] for contact info under        Signed, 10/28/2020, PA-C  10/24/2020, 6:42 AM

## 2020-10-25 ENCOUNTER — Other Ambulatory Visit (HOSPITAL_COMMUNITY): Payer: Self-pay

## 2020-10-25 DIAGNOSIS — I480 Paroxysmal atrial fibrillation: Secondary | ICD-10-CM | POA: Diagnosis not present

## 2020-10-25 DIAGNOSIS — I2694 Multiple subsegmental pulmonary emboli without acute cor pulmonale: Secondary | ICD-10-CM | POA: Diagnosis not present

## 2020-10-25 DIAGNOSIS — R778 Other specified abnormalities of plasma proteins: Secondary | ICD-10-CM | POA: Diagnosis not present

## 2020-10-25 LAB — GLUCOSE, CAPILLARY
Glucose-Capillary: 158 mg/dL — ABNORMAL HIGH (ref 70–99)
Glucose-Capillary: 193 mg/dL — ABNORMAL HIGH (ref 70–99)

## 2020-10-25 LAB — CBC
HCT: 25.3 % — ABNORMAL LOW (ref 39.0–52.0)
Hemoglobin: 8 g/dL — ABNORMAL LOW (ref 13.0–17.0)
MCH: 25.9 pg — ABNORMAL LOW (ref 26.0–34.0)
MCHC: 31.6 g/dL (ref 30.0–36.0)
MCV: 81.9 fL (ref 80.0–100.0)
Platelets: 564 10*3/uL — ABNORMAL HIGH (ref 150–400)
RBC: 3.09 MIL/uL — ABNORMAL LOW (ref 4.22–5.81)
RDW: 17.5 % — ABNORMAL HIGH (ref 11.5–15.5)
WBC: 19.6 10*3/uL — ABNORMAL HIGH (ref 4.0–10.5)
nRBC: 0 % (ref 0.0–0.2)

## 2020-10-25 LAB — BASIC METABOLIC PANEL
Anion gap: 10 (ref 5–15)
BUN: 19 mg/dL (ref 6–20)
CO2: 26 mmol/L (ref 22–32)
Calcium: 8.5 mg/dL — ABNORMAL LOW (ref 8.9–10.3)
Chloride: 101 mmol/L (ref 98–111)
Creatinine, Ser: 1.06 mg/dL (ref 0.61–1.24)
GFR, Estimated: >60 mL/min (ref >60)
Glucose, Bld: 109 mg/dL — ABNORMAL HIGH (ref 70–99)
Potassium: 3.7 mmol/L (ref 3.5–5.1)
Sodium: 137 mmol/L (ref 135–145)

## 2020-10-25 LAB — HEPARIN LEVEL (UNFRACTIONATED): Heparin Unfractionated: 0.44 IU/mL (ref 0.30–0.70)

## 2020-10-25 MED ORDER — AMIODARONE HCL 200 MG PO TABS
ORAL_TABLET | ORAL | 1 refills | Status: DC
  Filled 2020-10-25: qty 64, 30d supply, fill #0

## 2020-10-25 MED ORDER — APIXABAN 5 MG PO TABS: 5.0000 mg | ORAL_TABLET | Freq: Two times a day (BID) | ORAL | Status: DC

## 2020-10-25 MED ORDER — EMPAGLIFLOZIN 10 MG PO TABS
10.0000 mg | ORAL_TABLET | Freq: Every day | ORAL | 1 refills | Status: DC
  Filled 2020-10-25: qty 30, 30d supply, fill #0

## 2020-10-25 MED ORDER — POTASSIUM CHLORIDE 20 MEQ PO PACK
40.0000 meq | PACK | Freq: Once | ORAL | Status: AC
  Administered 2020-10-25: 40 meq via ORAL
  Filled 2020-10-25: qty 2

## 2020-10-25 MED ORDER — APIXABAN 5 MG PO TABS: 5.0000 mg | ORAL_TABLET | Freq: Two times a day (BID) | ORAL | 1 refills | Status: DC

## 2020-10-25 MED ORDER — ASPIRIN EC 81 MG PO TBEC: 81.0000 mg | DELAYED_RELEASE_TABLET | Freq: Every day | ORAL | 2 refills | Status: DC

## 2020-10-25 MED ORDER — EMPAGLIFLOZIN 10 MG PO TABS
10.0000 mg | ORAL_TABLET | Freq: Every day | ORAL | Status: DC
  Administered 2020-10-25: 10 mg via ORAL
  Filled 2020-10-25: qty 1

## 2020-10-25 MED ORDER — ASPIRIN EC 81 MG PO TBEC: 81.0000 mg | DELAYED_RELEASE_TABLET | Freq: Every day | ORAL | Status: DC

## 2020-10-25 MED ORDER — APIXABAN 5 MG PO TABS
10.0000 mg | ORAL_TABLET | Freq: Two times a day (BID) | ORAL | Status: DC
  Administered 2020-10-25: 10 mg via ORAL
  Filled 2020-10-25: qty 2

## 2020-10-25 MED ORDER — APIXABAN (ELIQUIS) VTE STARTER PACK (10MG AND 5MG)
ORAL_TABLET | ORAL | 0 refills | Status: DC
  Filled 2020-10-25: qty 74, 30d supply, fill #0

## 2020-10-25 NOTE — Progress Notes (Signed)
FPTS Brief Progress Note  S:Patient resting comfortably in bed.   O: BP 130/84 (BP Location: Left Arm)   Pulse 77   Temp 98.5 F (36.9 C) (Oral)   Resp 18   Ht 5\' 10"  (1.778 m)   Wt 83.6 kg   SpO2 99%   BMI 26.44 kg/m     A/P: 1. Multiple subsegmental pulmonary emboli without acute cor pulmonale (HCC)  2. Elevated troponin  - Orders reviewed. Labs for AM ordered, which was adjusted as needed.    , MD 10/25/2020, 12:39 AM PGY-1, 10/27/2020 [facility] Night Resident  Please page [phone] with questions.

## 2020-10-25 NOTE — Discharge Instructions (Addendum)
Was a pleasure to take care of you while you are in the hospital!  While here you were treated for pulmonary embolisms, blood clots in your lungs.  Please take Eliquis 5 mg twice daily for 6 months to help prevent blood clots and strokes from atrial fibrillation Please take amiodarone 400 mg twice a day tomorrow on September 21, and then only take 400 mg once a day thereafter Please decrease aspirin to 81 mg once a day Please start taking Jardiance 10 mg once a day to help protect your heart and kidney function Be sure to follow-up with your cardiologist at your postop appointment Please schedule an appointment with your primary care physician within a week  If your symptoms of shortness of breath, sweating, chest pain return please call your doctor or go to the nearest emergency room ====================================   Information on my medicine - ELIQUIS (apixaban)  This medication education was reviewed with me or my healthcare representative as part of my discharge preparation.     Why was Eliquis prescribed for you? Eliquis was prescribed to treat blood clots that may have been found in the veins of your legs (deep vein thrombosis) or in your lungs (pulmonary embolism) and to reduce the risk of them occurring again.  What do You need to know about Eliquis ? The starting dose is 10 mg (two 5 mg tablets) taken TWICE daily for the FIRST SEVEN (7) DAYS, then on (enter date)  11/01/20  the dose is reduced to ONE 5 mg tablet taken TWICE daily.  Eliquis may be taken with or without food.   Try to take the dose about the same time in the morning and in the evening. If you have difficulty swallowing the tablet whole please discuss with your pharmacist how to take the medication safely.  Take Eliquis exactly as prescribed and DO NOT stop taking Eliquis without talking to the doctor who prescribed the medication.  Stopping may increase your risk of developing a new blood clot.  Refill  your prescription before you run out.  After discharge, you should have regular check-up appointments with your healthcare provider that is prescribing your Eliquis.    What do you do if you miss a dose? If a dose of ELIQUIS is not taken at the scheduled time, take it as soon as possible on the same day and twice-daily administration should be resumed. The dose should not be doubled to make up for a missed dose.  Important Safety Information A possible side effect of Eliquis is bleeding. You should call your healthcare provider right away if you experience any of the following: Bleeding from an injury or your nose that does not stop. Unusual colored urine (red or dark brown) or unusual colored stools (red or black). Unusual bruising for unknown reasons. A serious fall or if you hit your head (even if there is no bleeding).  Some medicines may interact with Eliquis and might increase your risk of bleeding or clotting while on Eliquis. To help avoid this, consult your healthcare provider or pharmacist prior to using any new prescription or non-prescription medications, including herbals, vitamins, non-steroidal anti-inflammatory drugs (NSAIDs) and supplements.  This website has more information on Eliquis (apixaban): http://www.eliquis.com/eliquis/home  ==========================  Pulmonary Embolism    A pulmonary embolism (PE) is a sudden blockage or decrease of blood flow in one or both lungs. Most blockages come from a blood clot that forms in the vein of a lower leg, thigh, or arm (deep vein  thrombosis, DVT) and travels to the lungs. A clot is blood that has thickened into a gel or solid. PE is a dangerous and life-threatening condition that needs to be treated right away.  What are the causes? This condition is usually caused by a blood clot that forms in a vein and moves to the lungs. In rare cases, it may be caused by air, fat, part of a tumor, or other tissue that moves through  the veins and into the lungs.  What increases the risk? The following factors may make you more likely to develop this condition: Experiencing a traumatic injury, such as breaking a hip or leg. Having: A spinal cord injury. Orthopedic surgery, especially hip or knee replacement. Any major surgery. A stroke. DVT. Blood clots or blood clotting disease. Long-term (chronic) lung or heart disease. Cancer treated with chemotherapy. A central venous catheter. Taking medicines that contain estrogen. These include birth control pills and hormone replacement therapy. Being: Pregnant. In the period of time after your baby is delivered (postpartum). Older than age 70. Overweight. A smoker, especially if you have other risks.  What are the signs or symptoms? Symptoms of this condition usually start suddenly and include: Shortness of breath during activity or at rest. Coughing, coughing up blood, or coughing up blood-tinged mucus. Chest pain that is often worse with deep breaths. Rapid or irregular heartbeat. Feeling light-headed or dizzy. Fainting. Feeling anxious. Fever. Sweating. Pain and swelling in a leg. This is a symptom of DVT, which can lead to PE. How is this diagnosed? This condition may be diagnosed based on: Your medical history. A physical exam. Blood tests. CT pulmonary angiogram. This test checks blood flow in and around your lungs. Ventilation-perfusion scan, also called a lung VQ scan. This test measures air flow and blood flow to the lungs. An ultrasound of the legs.  How is this treated? Treatment for this condition depends on many factors, such as the cause of your PE, your risk for bleeding or developing more clots, and other medical conditions you have. Treatment aims to remove, dissolve, or stop blood clots from forming or growing larger. Treatment may include: Medicines, such as: Blood thinning medicines (anticoagulants) to stop clots from forming. Medicines  that dissolve clots (thrombolytics). Procedures, such as: Using a flexible tube to remove a blood clot (embolectomy) or to deliver medicine to destroy it (catheter-directed thrombolysis). Inserting a filter into a large vein that carries blood to the heart (inferior vena cava). This filter (vena cava filter) catches blood clots before they reach the lungs. Surgery to remove the clot (surgical embolectomy). This is rare. You may need a combination of immediate, long-term (up to 3 months after diagnosis), and extended (more than 3 months after diagnosis) treatments. Your treatment may continue for several months (maintenance therapy). You and your health care provider will work together to choose the treatment program that is best for you.  Follow these instructions at home: Medicines Take over-the-counter and prescription medicines only as told by your health care provider. If you are taking an anticoagulant medicine: Take the medicine every day at the same time each day. Understand what foods and drugs interact with your medicine. Understand the side effects of this medicine, including excessive bruising or bleeding. Ask your health care provider or pharmacist about other side effects.  General instructions Wear a medical alert bracelet or carry a medical alert card that says you have had a PE and lists what medicines you take. Ask your health care  provider when you may return to your normal activities. Avoid sitting or lying for a long time without moving. Maintain a healthy weight. Ask your health care provider what weight is healthy for you. Do not use any products that contain nicotine or tobacco, such as cigarettes, e-cigarettes, and chewing tobacco. If you need help quitting, ask your health care provider. Talk with your health care provider about any travel plans. It is important to make sure that you are still able to take your medicine while on trips. Keep all follow-up visits as told  by your health care provider. This is important.  Contact a health care provider if: You missed a dose of your blood thinner medicine.  Get help right away if: You have: New or increased pain, swelling, warmth, or redness in an arm or leg. Numbness or tingling in an arm or leg. Shortness of breath during activity or at rest. A fever. Chest pain. A rapid or irregular heartbeat. A severe headache. Vision changes. A serious fall or accident, or you hit your head. Stomach (abdominal) pain. Blood in your vomit, stool, or urine. A cut that will not stop bleeding. You cough up blood. You feel light-headed or dizzy. You cannot move your arms or legs. You are confused or have memory loss.  These symptoms may represent a serious problem that is an emergency. Do not wait to see if the symptoms will go away. Get medical help right away. Call your local emergency services (911 in the [place] Do not drive yourself to the hospital. Summary A pulmonary embolism (PE) is a sudden blockage or decrease of blood flow in one or both lungs. PE is a dangerous and life-threatening condition that needs to be treated right away. Treatments for this condition usually include medicines to thin your blood (anticoagulants) or medicines to break apart blood clots (thrombolytics). If you are given blood thinners, it is important to take the medicine every day at the same time each day. Understand what foods and drugs interact with any medicines that you are taking. If you have signs of PE or DVT, call your local emergency services (911 in the [place] This information is not intended to replace advice given to you by your health care provider. Make sure you discuss any questions you have with your health care provider. Document Revised: 10/30/2017 Document Reviewed: 10/30/2017 Elsevier Patient Education  2020 [employer].

## 2020-10-25 NOTE — Progress Notes (Signed)
OT Cancellation Note  Patient Details Name: TREI SCHOCH MRN: [id number] DOB: [birth year]   Cancelled Treatment:    Reason Eval/Treat Not Completed: OT screened, no needs identified, will sign off Chart reviewed with Physical Therapy's note and no needs at this time but if changes please re consult.   Alphia Moh OTR/L  Acute Rehab Services  [phone] office number [phone] pager number  Alphia Moh 10/25/2020, 7:14 AM

## 2020-10-25 NOTE — TOC Benefit Eligibility Note (Signed)
Patient Advocate Encounter  Prior Authorization for Jardiance 10 mg has been approved.    PA# [id number] Effective dates: 10/25/2020 through 10/25/2021  Patients co-pay is $4.00.     Roland Earl, CPhT Pharmacy Patient Advocate Specialist [place] Antimicrobial Stewardship Team Direct Number: [phone]  Fax: [phone]

## 2020-10-25 NOTE — TOC Benefit Eligibility Note (Signed)
Patient Product/process development scientist completed.    The patient is currently admitted and upon discharge could be taking Jardiance 10 mg.  Requires Prior Authorization  The patient is currently admitted and upon discharge could be taking Farxiga 10 mg.  Requires Prior Authorization  The patient is insured through [employer] [place]     Roland Earl, CPhT Pharmacy Patient Advocate Specialist [facility] Antimicrobial Stewardship Team Direct Number: [phone]  Fax: [phone]

## 2020-10-25 NOTE — Progress Notes (Signed)
Progress Note  Patient Name: Vincent Guerrero Date of Encounter: 10/25/2020  [facility] Cardiologist: Thomasene Ripple, DO   Subjective   Patient reports some recurrent chest pain yesterday afternoon after Echo that persisted until about 3-4am. He states the pain was along sternotomy incision and radiated to his back. Also reported left collar bone pain. Ranked the pain as a 6/10 on the pain scale. He thought it was from having to move certain ways during Echo. Currently chest pain free. No chest wall tenderness. No shortness of breath or palpitations. Eager to go home.  Inpatient Medications    Scheduled Meds:  amiodarone  400 mg Oral BID   aspirin  325 mg Oral Daily   atorvastatin  80 mg Oral Daily   insulin aspart  0-15 Units Subcutaneous TID WC   [START ON 10/26/2020] metFORMIN  1,000 mg Oral BID WC   metoprolol tartrate  50 mg Oral BID   nicotine  14 mg Transdermal Daily   pantoprazole  40 mg Oral QAC breakfast   umeclidinium bromide  1 puff Inhalation Daily   Continuous Infusions:  heparin 1,550 Units/hr (10/25/20 0023)   PRN Meds: acetaminophen **OR** acetaminophen, albuterol, oxyCODONE, polyethylene glycol   Vital Signs    Vitals:   10/24/20 1638 10/24/20 2039 10/25/20 0000 10/25/20 0456  BP: (!) 121/91 140/76 130/84 132/74  Pulse: 81 82 77 84  Resp: Temp: 98 F (36.7 C) 98 F (36.7 C) 98.5 F (36.9 C) 97.8 F (36.6 C)  TempSrc: Oral Oral Oral Oral  SpO2:  91% 99% 97%  Weight:    85.2 kg  Height:        Intake/Output Summary (Last 24 hours) at 10/25/2020 0749 Last data filed at 10/24/2020 1000 Gross per 24 hour  Intake 72.63 ml  Output --  Net 72.63 ml   Last 3 Weights 10/25/2020 10/24/2020 10/23/2020  Weight (lbs) 187 lb 14.4 oz 184 lb 4.8 oz 185 lb 8 oz  Weight (kg) 85.231 kg 83.598 kg 84.142 kg      Telemetry    Normal sinus rhythm with rates in the 60s to 90s at rest but in the low 100s to 110s with activity. - Personally  Reviewed  ECG    No new ECG tracing today. - Personally Reviewed  Physical Exam   GEN: No acute distress.   Neck: No JVD. Cardiac: RRR. Soft II/VI systolic murmur. No rubs or gallops.  Respiratory: Clear to auscultation bilaterally. GI: Soft, non-distended, and non-tender. MS: No lower extremity edema. No deformity. Skin: Warm and dry. Neuro:  No focal deficits. Psych: Normal affect. Responds appropriately.  Labs    High Sensitivity Troponin:   Recent Labs  Lab 10/23/20 1238 10/23/20 1457  TROPONINIHS 347* 278*     Chemistry Recent Labs  Lab 10/23/20 1238 10/23/20 1253 10/24/20 0321 10/25/20 0223  NA 136 136 134* 137  K 3.7 3.7 3.3* 3.7  CL 98 99 98 101  CO2 22  --  25 26  GLUCOSE 138* 136* 290* 109*  BUN CREATININE 1.26* 1.10 1.24 1.06  CALCIUM 9.1  --  8.6* 8.5*  PROT 6.8  --   --   --   ALBUMIN 3.2*  --   --   --   AST 25  --   --   --   ALT 18  --   --   --   ALKPHOS 61  --   --   --  BILITOT 1.2  --   --   --   GFRNONAA >60  --  >60 >60  ANIONGAP 16*  --  11 10    Lipids No results for input(s): CHOL, TRIG, HDL, LABVLDL, LDLCALC, CHOLHDL in the last 168 hours.  Hematology Recent Labs  Lab 10/23/20 1238 10/23/20 1253 10/24/20 0321 10/25/20 0223  WBC 18.1*  --  16.6* 19.6*  RBC 3.58*  --  3.29* 3.09*  HGB 9.5* 10.5* 8.6* 8.0*  HCT 29.7* 31.0* 27.0* 25.3*  MCV 83.0  --  82.1 81.9  MCH 26.5  --  26.1 25.9*  MCHC 32.0  --  31.9 31.6  RDW 18.3*  --  17.9* 17.5*  PLT 617*  --  618* 564*   Thyroid No results for input(s): TSH, FREET4 in the last 168 hours.  BNP Recent Labs  Lab 10/23/20 1823  BNP 168.0*    DDimer  Recent Labs  Lab 10/23/20 1238  DDIMER 10.71*     Radiology    CT Angio Chest PE W and/or Wo Contrast  Result Date: 10/23/2020 CLINICAL DATA:  Diaphoresis, lightheadedness, chest pain, and shortness of breath. CABG 1 week ago. EXAM: CT ANGIOGRAPHY CHEST WITH CONTRAST TECHNIQUE: Multidetector CT imaging of the  chest was performed using the standard protocol during bolus administration of intravenous contrast. Multiplanar CT image reconstructions and MIPs were obtained to evaluate the vascular anatomy. CONTRAST:  66mL OMNIPAQUE IOHEXOL 350 MG/ML SOLN COMPARISON:  Chest x-ray October 23, 2020. FINDINGS: Cardiovascular: The thoracic aorta is normal in caliber with no aneurysm or dissection. No significant atherosclerotic change identified. Cardiomegaly is noted. There appear to be a few small emboli in the left lower lobe. Three pulmonary emboli are identified in the left base. No other emboli identified outside of the left base. Mediastinum/Nodes: Postoperative changes are seen in the mediastinum. No pleural effusions. No suspicious pericardial effusion. No adenopathy. The esophagus and thyroid are normal. Lungs/Pleura: Bronchial wall thickening in the bases is identified. Central airways are otherwise normal. No pneumothorax. Mild dependent atelectasis. No nodules, masses, or focal infiltrates. Upper Abdomen: Possible sludge or stones in the gallbladder without wall thickening or adjacent stranding. Probable cysts in the liver, too small to characterize. Musculoskeletal: No chest wall abnormality. No acute or significant osseous findings. Review of the MIP images confirms the above findings. IMPRESSION: 1. Three left lower lobe pulmonary emboli, 1 segmental and 2 subsegmental. 2. Cardiomegaly. 3. Postoperative changes in the mediastinum after CABG 1 week ago. 4. Bronchial wall thickening in the bases, nonspecific. 5. Suggested sludge or stones in the gallbladder without wall thickening. Findings called to the patient's ER physician. Electronically Signed   By: Gerome Sam III M.D.   On: 10/23/2020 15:02   DG Chest Portable 1 View  Result Date: 10/23/2020 CLINICAL DATA:  Chest pain, diaphoresis, and shortness of breath aortic valve replacement and CABG on 10/14/2020 EXAM: PORTABLE CHEST 1 VIEW COMPARISON:   10/19/2020 FINDINGS: Aortic valve and markers from recent CABG noted. No sternal wire migration. No current cardiomegaly or edema. The lungs appear clear there no blunting of the costophrenic angles. No pneumothorax or obvious radiographic complicating features from recent CABG noted. IMPRESSION: 1. No acute findings. 2. Aortic valve prosthesis and markers from recent CABG. No edema or cardiomegaly currently. Electronically Signed   By: Gaylyn Rong M.D.   On: 10/23/2020 13:03   ECHOCARDIOGRAM COMPLETE  Result Date: 10/24/2020    ECHOCARDIOGRAM REPORT   Patient Name:   Vincent Guerrero  Date of Exam: 10/24/2020 Medical Rec #:  [id number]           Height:       70.0 in Accession #:    [id number]          Weight:       184.3 lb Date of Birth:  26-Sep-1960          BSA:          2.016 m Patient Age:    59 years            BP:           116/82 mmHg Patient Gender: M                   HR:           73 bpm. Exam Location:  Inpatient Procedure: 2D Echo, Cardiac Doppler and Color Doppler Indications:    Pulmonary embolus  History:        Patient has prior history of Echocardiogram examinations, most                 recent 10/14/2020. CAD, Prior CABG, Signs/Symptoms:Shortness of                 Breath and Chest Pain; Risk Factors:Hypertension, Diabetes,                 Dyslipidemia and Current Smoker. AVR - INSPIRIS RESILIA                 AORTIC VALVE (10/14/20). Hx CVA.  Sonographer:    Ross Ludwig RDCS (AE) Referring Phys: [id number] ANKIT NANAVATI IMPRESSIONS  1. Septal hypokinesis. . Left ventricular ejection fraction, by estimation, is 50 to 55%. The left ventricle has low normal function. There is severe left ventricular hypertrophy. Left ventricular diastolic parameters are consistent with Grade I diastolic dysfunction (impaired relaxation).  2. Right ventricular systolic function is normal. The right ventricular size is normal.  3. Trivial mitral valve regurgitation.  4. S/p AVR (25 mm Inspiris Resilia valve  (10/14/20)). Valve leaflets open well.. The aortic valve has been repaired/replaced. Aortic valve regurgitation is not visualized. FINDINGS  Left Ventricle: Septal hypokinesis. Left ventricular ejection fraction, by estimation, is 50 to 55%. The left ventricle has low normal function. The left ventricular internal cavity size was normal in size. There is severe left ventricular hypertrophy. Left ventricular diastolic parameters are consistent with Grade I diastolic dysfunction (impaired relaxation). Right Ventricle: The right ventricular size is normal. Right vetricular wall thickness was not assessed. Right ventricular systolic function is normal. Left Atrium: Left atrial size was normal in size. Right Atrium: Right atrial size was normal in size. Pericardium: There is no evidence of pericardial effusion. Mitral Valve: There is mild thickening of the mitral valve leaflet(s). Mild mitral annular calcification. Trivial mitral valve regurgitation. Tricuspid Valve: The tricuspid valve is normal in structure. Tricuspid valve regurgitation is trivial. Aortic Valve: S/p AVR (25 mm Inspiris Resilia valve (10/14/20)). Valve leaflets open well. The aortic valve has been repaired/replaced. Aortic valve regurgitation is not visualized. Aortic valve mean gradient measures 9.0 mmHg. Aortic valve peak gradient measures 17.5 mmHg. Aortic valve area, by VTI measures 2.20 cm. Pulmonic Valve: The pulmonic valve was normal in structure. Pulmonic valve regurgitation is not visualized. Aorta: The aortic root and ascending aorta are structurally normal, with no evidence of dilitation. IAS/Shunts: No atrial level shunt detected by color flow Doppler.  LEFT VENTRICLE PLAX 2D LVIDd:  4.40 cm  Diastology LVIDs:         3.20 cm  LV e' medial:    4.35 cm/s LV PW:         1.70 cm  LV E/e' medial:  22.3 LV IVS:        1.90 cm  LV e' lateral:   7.29 cm/s LVOT diam:     2.40 cm  LV E/e' lateral: 13.3 LV SV:         75 LV SV Index:   37 LVOT  Area:     4.52 cm  RIGHT VENTRICLE RV Basal diam:  3.50 cm RV S prime:     6.73 cm/s TAPSE (M-mode): 1.6 cm LEFT ATRIUM           Index       RIGHT ATRIUM           Index LA diam:      4.60 cm 2.28 cm/m  RA Area:     10.00 cm LA Vol (A4C): 52.5 ml 26.04 ml/m RA Volume:   16.50 ml  8.18 ml/m  AORTIC VALVE AV Area (Vmax):    2.40 cm AV Area (Vmean):   2.09 cm AV Area (VTI):     2.20 cm AV Vmax:           209.00 cm/s AV Vmean:          139.000 cm/s AV VTI:            0.341 m AV Peak Grad:      17.5 mmHg AV Mean Grad:      9.0 mmHg LVOT Vmax:         111.00 cm/s LVOT Vmean:        64.100 cm/s LVOT VTI:          0.166 m LVOT/AV VTI ratio: 0.49  AORTA Ao Root diam: 3.20 cm Ao Asc diam:  3.50 cm MITRAL VALVE MV Area (PHT): 3.01 cm     SHUNTS MV Decel Time: 252 msec     Systemic VTI:  0.17 m MV E velocity: 97.20 cm/s   Systemic Diam: 2.40 cm MV A velocity: 103.00 cm/s MV E/A ratio:  0.94 Dietrich Pates MD Electronically signed by Dietrich Pates MD Signature Date/Time: 10/24/2020/3:08:04 PM    Final    VAS [place] LOWER EXTREMITY VENOUS (DVT) (ONLY [facility] & [facility] 7a-7p)  Result Date: 10/23/2020  Lower Venous DVT Study Patient Name:  Vincent Guerrero  Date of Exam:   10/23/2020 Medical Rec #: [id number]            Accession #:    [id number] Date of Birth: 1960/11/20           Patient Gender: M Patient Age:   32 years Exam Location:  [facility] Procedure:      VAS [place] LOWER EXTREMITY VENOUS (DVT) Referring Phys: Janey Genta NANAVATI --------------------------------------------------------------------------------  Indications: Edema.  Comparison Study: no prior Performing Technologist: [place] Ponder RVS  Examination Guidelines: A complete evaluation includes B-mode imaging, spectral Doppler, color Doppler, and power Doppler as needed of all accessible portions of each vessel. Bilateral testing is considered an integral part of a complete examination. Limited examinations for reoccurring indications may be performed as noted. The  reflux portion of the exam is performed with the patient in reverse Trendelenburg.  +---------+---------------+---------+-----------+----------+--------------+ RIGHT    CompressibilityPhasicitySpontaneityPropertiesThrombus Aging +---------+---------------+---------+-----------+----------+--------------+ CFV      Full           Yes  Yes                                 +---------+---------------+---------+-----------+----------+--------------+ SFJ      Full                                                        +---------+---------------+---------+-----------+----------+--------------+ FV Prox  Full                                                        +---------+---------------+---------+-----------+----------+--------------+ FV Mid   Full                                                        +---------+---------------+---------+-----------+----------+--------------+ FV DistalFull                                                        +---------+---------------+---------+-----------+----------+--------------+ PFV      Full                                                        +---------+---------------+---------+-----------+----------+--------------+ POP      Full           Yes      Yes                                 +---------+---------------+---------+-----------+----------+--------------+ PTV      Full                                                        +---------+---------------+---------+-----------+----------+--------------+ PERO     Full                                                        +---------+---------------+---------+-----------+----------+--------------+   +----+---------------+---------+-----------+----------+--------------+ LEFTCompressibilityPhasicitySpontaneityPropertiesThrombus Aging +----+---------------+---------+-----------+----------+--------------+ CFV Full           Yes      Yes                                  +----+---------------+---------+-----------+----------+--------------+     Summary: RIGHT: - There is no evidence of deep vein thrombosis in the lower extremity.  -  No cystic structure found in the popliteal fossa.  LEFT: - No evidence of common femoral vein obstruction.  *See table(s) above for measurements and observations. Electronically signed by Gerarda Fraction on 10/23/2020 at 2:56:53 PM.    Final     Cardiac Studies   TEE 08/30/2020: Impressions:  1. Left ventricular ejection fraction, by estimation, is 60 to 65%. The  left ventricle has normal function. There is moderate left ventricular  hypertrophy.   2. Right ventricular systolic function is normal. The right ventricular  size is normal.   3. No left atrial/left atrial appendage thrombus was detected.   4. The mitral valve is grossly normal. Mild to moderate mitral valve  regurgitation.   5. There was no mass seen on the Tricuspid valve.   6. The aortic valve is calcified. Aortic valve regurgitation is mild.  Moderate to severe aortic valve stenosis.  _______________   Right/Left Cardiac Catheterization 09/12/2020:   Ost LAD to Mid LAD lesion is 65% stenosed.   Mid LM to Ost LAD lesion is 35% stenosed with 70% stenosed side branch in Ramus.   1st LPL lesion is 99% stenosed.   Ost RCA to Prox RCA lesion is 100% stenosed.   1st Mrg lesion is 70% stenosed.   LV end diastolic pressure is normal.   There is moderate aortic valve stenosis.   Summary: Multivessel CAD:  Known CTO of nondominant RCA, and now CTO of LPL 1 along  70% ostial stenosis of Ramus Intermedius and 1st Mrg Angiographically mild to moderate disease in the proximal LAD, however, by IVUS 2 tandem lesions of 65% stenosis. Angiographically concerning distal left main lesion that is new.  By IVUS appears to be a potential healed dissection. Normal Right Heart Cath Pressures: PA mean 18 mmHg with a Wedge pressure of 9 mm.  LVEDP of 11 mmHg. Moderate  aortic stenosis by pressure measurement: Mean gradient 27 mmHg.   Recommendations: With plans for open AVR, would consider the possibility of CABG -grafting LAD, RI as well as potentially branches of the dominant LCx. (Left Main lesion is not significant, but raises the specter of of unstable plaque in the Left Main and proximal LAD) _______________   Chest CTA 10/23/2020: Impressions: 1. Three left lower lobe pulmonary emboli, 1 segmental and 2 subsegmental. 2. Cardiomegaly. 3. Postoperative changes in the mediastinum after CABG 1 week ago. 4. Bronchial wall thickening in the bases, nonspecific. 5. Suggested sludge or stones in the gallbladder without wall thickening.   Findings called to the patient's ER physician. _______________  Echocardiogram 10/24/2020: Impressions:  1. Septal hypokinesis. . Left ventricular ejection fraction, by  estimation, is 50 to 55%. The left ventricle has low normal function.  There is severe left ventricular hypertrophy. Left ventricular diastolic  parameters are consistent with Grade I  diastolic dysfunction (impaired relaxation).   2. Right ventricular systolic function is normal. The right ventricular  size is normal.   3. Trivial mitral valve regurgitation.   4. S/p AVR (25 mm Inspiris Resilia valve (10/14/20)). Valve leaflets open  well.. The aortic valve has been repaired/replaced. Aortic valve  regurgitation is not visualized.   Patient Profile     60 y.o. male with a history of CAD s/p prior stenting to RCA in 2010 and recent CABG x4 (LIMA-LAD, SVG-RI, and sequential SVG-OM-distal LCX) on 10/14/2020, severe aortic stenosis s/p bioprosthetic aortic valve replacement at time of CABG on 10/14/2020, prior CVA, hypertension, hyperlipidemia, type 2 diabetes mellitus, and tobacco use who presented  on 10/23/2020 with chest pain and acute dyspnea. Found to have PE.  Assessment & Plan    Chest Pain Dyspnea on Exertion Acute PE - Patient presented with  acute symptoms of weakness, dyspnea, dizziness, and chest pain. - EKG showed showed sinus rhythm with LVH and repolarization changes. - High-sensitivity troponin elevated but downtrending at 347 >> 278. - D-Dimer elevated at 10.71. Chest CTA showed 3 left lower lobe, 1 segmental, and 2 subsegmental PE. - Echo showed LVEF of 50-55% with septal hypokinesis and grade 1 diastolic dysfunction. Will review with Dr. Rennis Golden. - He did have some recurrent chest pain that radiated to back and left collar bone for several hours yesterday after Echo was performed. - Symptoms felt to be out of proportion for degree of PE; however, possible that he had a larger PE that broke into small emboli. Initially not felt to be due to premature graft failure but will review Echo results with MI and discuss whether any additional work-up is needed. - Started on IV Heparin for PE. If no additional work-up needed, can switch to DOAC.   CAD s/p Recent CABG - S/p remote stenting to RCA in 2010 and recent CABG x4 on 10/14/2020. - Continue beta-blocker and high-intensity statin. - Currently on Aspirin  daily. Will discuss whether Aspirin should be continued while on anticoagulation given drop in hemoglobin. If it is continued, will likely need to decrease to  daily.   Severe Aortic Stenosis s/p Recent AVR - S/p recent bioprosthetic AVR on 10/14/2020. - Repeat Echo showed normal functioning prosthetic valve with mean gradient of 9.0 mmHg and peak gradient of 17.5 mmHg.   Post-Op Atrial Fibrillation - Maintaining sinus rhythm. - Continue Amiodarone  twice daily through 10/26/2020 and then plan was to decrease to  once daily. - Continue Lopressor  twice daily.  - Was not started on anticoagulation after CABG. Will need to be discharged on DOAC given acute PE. Per Dr. Rennis Golden, would probably treat for 6 months for PE. And if no recurrent atrial fibrillation during that time, may not need long-term anticoagulation  as this seems likely to be peri-procedural.   Hypertension - BP mostly well controlled. - Continue Lopressor as above.   Hyperlipidemia - Continue Lipitor  daily.   Type 2 Diabetes Mellitus - Management per primary team.  Chronic Anemia - Hemoglobin dropped from 10.5 on admission to 8.0. Recent baseline around 8.5 to 10.5.  - Management per primary team.  Leukocytosis - WBC 19.6 today (up from 16.6 yesterday).  - Afebrile. - Management per primary team.  For questions or updates, please contact [facility] Please consult [email or web address] for contact info under        Signed, Corrin Parker, PA-C  10/25/2020, 7:49 AM

## 2020-10-25 NOTE — Discharge Summary (Addendum)
Family Medicine Teaching [facility] Discharge Summary  Patient name: Vincent Guerrero Medical record number: [id number] Date of birth: 1961/01/25 Age: 60 y.o. Gender: male Date of Admission: 10/23/2020  Date of Discharge: 9/20 Admitting Physician: Shirlean Mylar, MD  Primary Care Provider: Pcp, No Consultants: Cardiology  Indication for Hospitalization: Multiple segmental pulmonary emboli  Discharge Diagnoses/Problem List:  Chest Pain with dyspnea on exertion due to multiple segmental pulmonary emboli Paroxysmal atrial fibrillation SVT on telemetry CAD Severe AS S/P AVR Hypertension Hyperlipidemia Type 2 diabetes mellitus Tobacco use GERD Constipation  Disposition: Home  Discharge Condition: Stable  Discharge Exam:  Gen- age-appropriate, AAM, resting comfortably in bed, Aox3, NAD CV-Sternal incision CDI - no drainage or swelling, RRR, no m/r/g Resp-No iWOB, CTAB Abd- soft, NT, ND, normal BS Extremities: 2+ pulses, swelling in R leg stable, bruising on back of calf from catherization  Brief Hospital Course:  Vincent Guerrero is a 60 year old male who presented to the ED with shortness of breath on exertion and chest pain.  Past medical history significant for severe aortic regurgitation status post aortic valve replacement, CAD status post stents of RCA in 2019 and recent CABG x4 during AVR, prior CVA, hypertension, hyperlipidemia, type 2 diabetes mellitus, tobacco use.  Hospital course below by problem:  Chest pain with dyspnea on exertion  multiple segmental pulmonary emboli Patient received AVR and CABGx4 1 week ago on 9/9.  He was discharged on 9/15, and then started having chest pains and palpitations and the day of admission felt like he could not breathe when EMS arrived.  He improved with oxygen in route.  Patient has also had pain swelling and bruising from catheterization on right calf.  In the ED he was saturating well on room air had a D-dimer of 10.71,  EKG with previous reads of ST elevation in anterior leads and LVH, and x-ray that showed no edema or cardiomegaly.  Lower extremity DVT ultrasound did not show any DVT or femoral vein obstruction.  CTA showed 3 left lower pulmonary emboli, 1 segmental and 2 subsegmental.  Cardiology was consulted and started on heparin drip for PE.  Troponins trended down from 347-278.  White blood cell count was elevated at 18.1, but unlikely that it was a graft failure, incision on sternotomy looked clean dry and intact throughout hospital stay without any swelling.  Echo was similar to previous echo that he had, aortic valve replacement was intact, no aortic valve regurgitation and EF 50-55%. No significant right heart strain. He was treated with heparin drip and then prior to discharge was transitioned to Eliquis. He was walking without shortness of breath and no oxygen requirements.  Did not have any chest pain on discharge.  Postoperative atrial fibrillation Patient had episodes of atrial fibrillation with rates to 150s that subsequently decreased to 130s.  EKG during this time did confirm A. fib.  He was controlled on 50 mg twice daily metoprolol, and amiodarone 400 mg twice daily.  At discharge had no other subsequent A. fib episodes on telemetry.  We will continue amiodarone 10/26/2020 twice daily and then plan to decrease to 400 mg once daily on 10/27/2020.  CAD Had prior stent to RCA in 2010, and recent CABG x4 during AVR.  Continued on aspirin 325 and atorvastatin daily.  Severe AS Received AVR on 10/14/2020.  Echo showed normal valve movement and no aortic valve regurgitation.  Hypertension No hypertensive emergencies or urgency is, continued on metoprolol 50 mg twice daily in hospital.  Hyperlipidemia Continued on home atorvastatin  Type 2 diabetes mellitus Had hemoglobin A1c of 6.5 in the hospital. He was transitioned to moderate sliding scale insulin and CBGs were monitored.   Tobacco use  disorder Nicotine patch was offered as needed  GERD Home Protonix was continue  Constipation MiraLAX was given daily  Issues for follow up: 1. Patient transitioned from heparin to eliquis prior to discharge, will need to continue on eliquis for 6 months and reassess if he has any A fib continued may not need long term anticoagulation. 2. Continue Amiodarone 400mg  twice daily through 10/26/2020 and then plan was to decrease to 400mg  once daily on 10/27/2020 3. Switched him from home glipizide to jardiance for T2DM 4. Follow up with cardiologist Dr. 5. Ok to decrease to 81 mg aspirin daily per cardiology with conconmitant Eliquis use  Significant Procedures: None  Significant Labs and Imaging:  Recent Labs  Lab 10/23/20 1238 10/23/20 1253 10/24/20 0321 10/25/20 0223  WBC 18.1*  --  16.6* 19.6*  HGB 9.5* 10.5* 8.6* 8.0*  HCT 29.7* 31.0* 27.0* 25.3*  PLT 617*  --  618* 564*   Recent Labs  Lab 10/23/20 1238 10/23/20 1253 10/24/20 0321 10/25/20 0223  NA 136 136 134* 137  K 3.7 3.7 3.3* 3.7  CL 98 99 98 101  CO2 22  --  25 26  GLUCOSE 138* 136* 290* 109*  BUN 19 19 18 19   CREATININE 1.26* 1.10 1.24 1.06  CALCIUM 9.1  --  8.6* 8.5*  ALKPHOS 61  --   --   --   AST 25  --   --   --   ALT 18  --   --   --   ALBUMIN 3.2*  --   --   --       Results/Tests Pending at Time of Discharge:   Discharge Medications:  Allergies as of 10/25/2020       Reactions   Cetacaine [butamben-tetracaine-benzocaine] Shortness Of Breath, Other (See Comments)   Intense laryngospasm        Medication List     STOP taking these medications    ferrous fumarate-b12-vitamic C-folic acid capsule Commonly known as: TRINSICON / FOLTRIN   glipiZIDE 10 MG tablet Commonly known as: GLUCOTROL       TAKE these medications    amiodarone 200 MG tablet Commonly known as: PACERONE Please take 2 tablets (400 mg) twice daily on 9/21 only, then take 2 tablets (400 mg) once daily  thereafter. What changed:  medication strength how much to take how to take this when to take this additional instructions   Apixaban Starter Pack (10mg  and 5mg ) Commonly known as: ELIQUIS STARTER PACK Take as directed on package: start with two-5mg  tablets twice daily for 7 days. On day 8, switch to one-5mg  tablet twice daily.   aspirin EC 81 MG tablet Take 1 tablet (81 mg total) by mouth daily. Swallow whole. What changed: Another medication with the same name was removed. Continue taking this medication, and follow the directions you see here.   atorvastatin 80 MG tablet Commonly known as: LIPITOR Take 1 tablet (80 mg total) by mouth daily.   cloNIDine 0.1 MG tablet Commonly known as: CATAPRES Take 0.1 mg by mouth See admin instructions. Take 0.1 mg by mouth one to three times a day as needed for a Systolic reading of 180 or greater   empagliflozin 10 MG Tabs tablet Commonly known as: JARDIANCE Take 1 tablet (10  mg total) by mouth daily.   isosorbide mononitrate 60 MG 24 hr tablet Commonly known as: IMDUR Take 60 mg by mouth daily.   latanoprost 0.005 % ophthalmic solution Commonly known as: XALATAN Place 1 drop into the right eye at bedtime.   metFORMIN 1000 MG tablet Commonly known as: GLUCOPHAGE Take 1,000 mg by mouth 2 (two) times daily with a meal.   metoprolol tartrate 50 MG tablet Commonly known as: LOPRESSOR Take 1 tablet (50 mg total) by mouth 2 (two) times daily.   oxyCODONE 5 MG immediate release tablet Commonly known as: Oxy IR/ROXICODONE Take 1 tablet (5 mg total) by mouth every 6 (six) hours as needed for severe pain.   pantoprazole 40 MG tablet Commonly known as: PROTONIX Take 1 tablet (40 mg total) by mouth daily. What changed: when to take this   Pataday 0.2 % Soln Generic drug: Olopatadine HCl Place 1 drop into the right eye 2 (two) times daily.   Spiriva Respimat 2.5 MCG/ACT Aers Generic drug: Tiotropium Bromide Monohydrate Inhale 1  puff into the lungs daily.   valsartan 40 MG tablet Commonly known as: Diovan Take 1 tablet (40 mg total) by mouth daily. What changed: when to take this        Discharge Instructions: Please refer to Patient Instructions section of EMR for full details.  Patient was counseled important signs and symptoms that should prompt return to medical care, changes in medications, dietary instructions, activity restrictions, and follow up appointments.   Follow-Up Appointments:  Follow-up Information     Tobb, Kardie, DO. Schedule an appointment as soon as possible for a visit.   Specialty: Cardiology Why: Please make an appointment with PCP at your earliest convenience for a hospital follow up. Contact information: [address] [place] [address] [phone]                 Levin Erp, MD 10/25/2020, 4:06 PM PGY-1, [facility]   FPTS Upper-Level Resident Addendum   I have independently interviewed and examined the patient. I have discussed the above with the original author and agree with their documentation. I have made additional edits as necessary. Please see also any attending notes.   Shirlean Mylar, M.D. PGY-3, [facility] 10/25/2020 5:20 PM  FPTS Service pager: [phone] (text pages welcome through [facility])

## 2020-10-25 NOTE — Progress Notes (Signed)
ANTICOAGULATION CONSULT NOTE - Initial Consult  Pharmacy Consult for Apixaban  Indication: pulmonary embolus  Allergies  Allergen Reactions   Cetacaine [Butamben-Tetracaine-Benzocaine] Shortness Of Breath and Other (See Comments)    Intense laryngospasm    Patient Measurements: Height: 5\' 10"  (177.8 cm) Weight: 85.2 kg (187 lb 14.4 oz) IBW/kg (Calculated) : 73   Vital Signs: Temp: 97.8 F (36.6 C) (09/20 0456) Temp Source: Oral (09/20 0456) BP: 120/76 (09/20 0850) Pulse Rate: 84 (09/20 0456)  Labs: Recent Labs    10/23/20 1238 10/23/20 1253 10/23/20 1457 10/23/20 2200 10/24/20 0321 10/24/20 1629 10/25/20 0223  HGB 9.5* 10.5*  --   --  8.6*  --  8.0*  HCT 29.7* 31.0*  --   --  27.0*  --  25.3*  PLT 617*  --   --   --  618*  --  564*  HEPARINUNFRC  --   --   --    < > 0.43 0.53 0.44  CREATININE 1.26* 1.10  --   --  1.24  --  1.06  TROPONINIHS 347*  --  278*  --   --   --   --    < > = values in this interval not displayed.    Estimated Creatinine Clearance: 77.5 mL/min (by C-G formula based on SCr of 1.06 mg/dL).   Medical History: Past Medical History:  Diagnosis Date   Age-related nuclear cataract of both eyes 11/27/2018   Anemia    Bilateral recurrent inguinal hernia without obstruction or gangrene 05/09/2016   Added automatically from request for surgery 07/09/2016   Coronary artery disease involving native coronary artery without angina pectoris 05/2016   History of PCI to nondominant RCA. ->  Cath in April 2018 showed occluded nondominant RCA stent.  30% LM.  pLAD 40%.  Ostial RI 60%.   Cortical age-related cataract of both eyes 11/27/2018   Depression    Diabetes mellitus (HCC)    Hyperlipidemia LDL goal <70    Hypertension    Inguinal hernia without obstruction or gangrene    BILATERAL   Primary open angle glaucoma (POAG) of both eyes, severe stage 11/27/2018   Primary open angle glaucoma (POAG) of left eye, severe stage 11/27/2018   Stroke (HCC)     Tobacco abuse 11/05/2019   Tobacco use 11/06/2019    Medications:  Scheduled:   amiodarone  400 mg Oral BID   apixaban  10 mg Oral BID   Followed by   01/06/2020 ON 11/01/2020] apixaban  5 mg Oral BID   aspirin  325 mg Oral Daily   atorvastatin  80 mg Oral Daily   empagliflozin  10 mg Oral Daily   insulin aspart  0-15 Units Subcutaneous TID WC   [START ON 10/26/2020] metFORMIN  1,000 mg Oral BID WC   metoprolol tartrate  50 mg Oral BID   nicotine  14 mg Transdermal Daily   pantoprazole  40 mg Oral QAC breakfast   umeclidinium bromide  1 puff Inhalation Daily    Assessment: 60 yo male with provoked segmental and subsegmental PE requiring transition from UFH to oral anticoagulation for discharge. Therapeutic on heparin since 9/18 2200. No infusion issues or signs of bleeding, low bleed risk. Platelets, hgb, hct stable.  Plan: Stop heparin Initiate apixaban 10mg  BID x 7 days immediately following cessation of UFH, followed by apixaban 5mg  BID for 3 months given provoked PE. Monitor s/sx bleeding   Courtni Balash 10/25/2020,1:37 PM

## 2020-10-26 NOTE — Progress Notes (Signed)
[facility]  [address] [place],  [place]  [address] [phone]  Clinic Day:  11/02/2020  Referring physician: Jearld Lesch, MD  This document serves as a record of services personally performed by Gery Pray, MD. It was created on their behalf by Curry,Lauren E, a trained medical scribe. The creation of this record is based on the scribe's personal observations and the provider's statements to them.  CHIEF COMPLAINT:  CC:   Recurrent iron deficiency anemia  Current Treatment:   IV iron replacement with Feraheme   HISTORY OF PRESENT ILLNESS:  Vincent Guerrero is a 60 y.o. male with a history of recurrent severe iron deficiency anemia despite oral iron supplementation. He was seen by Dr. Bertis Ruddy on June 1st and his hemoglobin was 9.1 at that time. Iron studies were consistent with iron deficiency.  B12 was normal.  TSH was normal.  Vitamin-D was mildly low.  Review of his CBCs dating back to April 2018, revealed his hemoglobin ranged from 7.3-12.5, always with low MCV. Last year, he presented with severe anemia requiring blood transfusion. He underwent EGD and colonoscopy in October 2021. EGD revealed non-bleeding gastric ulcers.  He continued taking aspirin-containing products and using NSAIDs, so was instructed to stop these. The patient was symptomatic from anemia and the iron level was critically low. He was given IV iron in the form of Feraheme with a goal to correct his anemia and iron deficiency and avoid transfusion support. When he was seen in July, his hemoglobin was up to 11.9. Folate, LDH and haptoglobin were all normal. Serum protein electrophoresis was negative for monoclonal spike. He was admitted on September 9th for severe aortic stenosis and had aortic valve replacement and coronary artery bypass grafting. Hemoglobin prior to surgery was 9.2, and down to 8.1 at the lowest following the procedure. He was discharged on September  15th on amiodarone due to atrial fibrillation and was advised to take aspirin 325 mg daily.  INTERVAL HISTORY:  Vincent Guerrero is here for routine follow up and unfortunately was admitted to [facility] on September 18th due to multiple pulmonary emboli. Imaging confirmed three left lower lobe pulmonary emboli, 1 segmental and 2 subsegmental. Hemoglobin at that time was up to 9.5. He was placed on Eliquis 10 mg BID to be switched to 5 mg daily after loading dose, and was advised to decrease aspirin to 81 mg. He will need to stay on anticoagulation for at least 6 months, but if he remains in atrial fibrillation, he will require this longer. Hemoglobin at the time of discharge was down to 8.0 with an MCV OF 82, white count was 19.6, and platelets were 564,000. He states that he is not taking oral iron supplement, as he does not tolerate them. He denies any evidence of overt bleeding. He denies pica to ice, but does note intermittent soreness of the mouth. He continues aspirin 81 mg daily, but occasionally takes an extra dose. I advised that he avoid extra doses in view of the risk for GI bleed. He does have arthritis and headache, which is unaffected by Tylenol. He reports fatigue and generalized weakness. He reports productive cough with white sputum but denies shortness of breath. Hemoglobin today is 8.8 with an MCV of 83, platelets are elevated at 462,000, and white count is normal. Chemistries are unremarkable except for a BUN of 21, and a creatinine of 1.3, previously 1.3. Non-fasting blood glucose is 207.  His  appetite  is good, and he has lost 3 and 1/2 pounds since his last visit.  He denies fever, chills or other signs of infection.  He denies nausea, vomiting, bowel issues, or abdominal pain.  He denies sore throat, cough, dyspnea, or chest pain.  REVIEW OF SYSTEMS:  Review of Systems  Constitutional:  Positive for fatigue (and generalized weakness). Negative for appetite change, chills, fever  and unexpected weight change.  HENT:   Positive for mouth sores (intermittent soreness of the mouth).   Eyes: Negative.   Respiratory:  Positive for cough (productive with white sputum). Negative for chest tightness, hemoptysis, shortness of breath and wheezing.   Cardiovascular: Negative.  Negative for chest pain, leg swelling and palpitations.  Gastrointestinal: Negative.  Negative for abdominal distention, abdominal pain, blood in stool, constipation, diarrhea, nausea and vomiting.  Endocrine: Negative.   Genitourinary: Negative.  Negative for difficulty urinating, dysuria, frequency and hematuria.   Musculoskeletal:  Positive for arthralgias. Negative for back pain, flank pain, gait problem and myalgias.  Skin: Negative.   Neurological:  Positive for headaches (occasional). Negative for dizziness, extremity weakness, gait problem, light-headedness, numbness, seizures and speech difficulty.  Hematological: Negative.   Psychiatric/Behavioral: Negative.  Negative for depression and sleep disturbance. The patient is not nervous/anxious.   All other systems reviewed and are negative.   VITALS:  Blood pressure 139/69, pulse 67, temperature 97.7 F (36.5 C), temperature source Oral, resp. rate 18, height 5\' 10"  (1.778 m), weight 198 lb 1.6 oz (89.9 kg), SpO2 99 %.  Wt Readings from Last 3 Encounters:  11/02/20 198 lb 1.6 oz (89.9 kg)  10/31/20 189 lb 6.4 oz (85.9 kg)  10/25/20 187 lb 14.4 oz (85.2 kg)    Body mass index is 28.42 kg/m.  Performance status (ECOG): 1 - Symptomatic but completely ambulatory  PHYSICAL EXAM:  Physical Exam Constitutional:      General: He is not in acute distress.    Appearance: Normal appearance. He is normal weight.  HENT:     Head: Normocephalic and atraumatic.  Eyes:     General: No scleral icterus.    Extraocular Movements: Extraocular movements intact.     Conjunctiva/sclera: Conjunctivae normal.     Pupils: Pupils are equal, round, and reactive  to light.  Cardiovascular:     Rate and Rhythm: Normal rate and regular rhythm.     Pulses: Normal pulses.     Heart sounds: No murmur heard.   No friction rub. No gallop.     Comments: Mid systolic thump, likely from aortic valve Pulmonary:     Effort: Pulmonary effort is normal. No respiratory distress.     Breath sounds: Normal breath sounds.  Chest:     Comments: Large sternal scar which is healing well but does have ecchymosis. Abdominal:     General: Bowel sounds are normal. There is no distension.     Palpations: Abdomen is soft. There is no hepatomegaly, splenomegaly or mass.     Tenderness: There is no abdominal tenderness.  Musculoskeletal:        General: Normal range of motion.     Cervical back: Normal range of motion and neck supple.     Right lower leg: 1+ Edema present.     Left lower leg: No edema.  Lymphadenopathy:     Cervical: No cervical adenopathy.  Skin:    General: Skin is warm and dry.  Neurological:     General: No focal deficit present.     Mental  Status: He is alert and oriented to person, place, and time. Mental status is at baseline.  Psychiatric:        Mood and Affect: Mood normal.        Behavior: Behavior normal.        Thought Content: Thought content normal.        Judgment: Judgment normal.   LABS:   CBC Latest Ref Rng & Units 11/02/2020 10/25/2020 10/24/2020  WBC - 10.0 19.6(H) 16.6(H)  Hemoglobin 13.5 - 17.5 8.8(A) 8.0(L) 8.6(L)  Hematocrit 41 - 53 28(A) 25.3(L) 27.0(L)  Platelets 150 - 399 462(A) 564(H) 618(H)   CMP Latest Ref Rng & Units 11/02/2020 10/25/2020 10/24/2020  Glucose 70 - 99 mg/dL - 161(W) 960(A)  BUN 4 - 21 21 19 18   Creatinine 0.6 - 1.3 1.3 1.06 1.24  Sodium 137 - 147 138 137 134(L)  Potassium 3.4 - 5.3 4.5 3.7 3.3(L)  Chloride 99 - 108 102 101 98  CO2 13 - 22 25(A) 26 25  Calcium 8.7 - 10.7 9.4 8.5(L) 8.6(L)  Total Protein 6.5 - 8.1 g/dL - - -  Total Bilirubin 0.3 - 1.2 mg/dL - - -  Alkaline Phos 25 - 125 108 - -   AST 14 - 40 27 - -  ALT 10 - 40 15 - -      Lab Results  Component Value Date   TOTALPROTELP 6.3 09/01/2020   ALBUMINELP 3.7 09/01/2020   A1GS 0.2 09/01/2020   A2GS 0.7 09/01/2020   BETS 0.9 09/01/2020   GAMS 0.7 09/01/2020   MSPIKE Not Observed 09/01/2020   Lab Results  Component Value Date   TIBC 346 09/01/2020   TIBC 323 08/29/2020   TIBC 476 (H) 07/06/2020   FERRITIN 40 09/01/2020   FERRITIN 46 08/29/2020   FERRITIN 7 (L) 07/06/2020   IRONPCTSAT 14 (L) 09/01/2020   IRONPCTSAT 14 (L) 08/29/2020   IRONPCTSAT 5 (L) 07/06/2020   Lab Results  Component Value Date   LDH 137 09/01/2020    STUDIES:  DG Chest 2 View  Result Date: 10/19/2020 CLINICAL DATA:  Status post AVR EXAM: CHEST - 2 VIEW COMPARISON:  10/16/2020 FINDINGS: Cardiomegaly status post median sternotomy and CABG with aortic valve prosthesis. Trace bilateral pleural effusions. No acute airspace opacity. Osseous structures are unremarkable. IMPRESSION: 1. Trace bilateral pleural effusions. No acute airspace opacity. 2. Cardiomegaly status post median sternotomy and CABG with aortic valve prosthesis. Electronically Signed   By: Lauralyn Primes M.D.   On: 10/19/2020 08:12   DG Chest 2 View  Result Date: 10/12/2020 CLINICAL DATA:  Preop for cardiac surgery. EXAM: CHEST - 2 VIEW COMPARISON:  Jun 20, 2020. FINDINGS: The heart size and mediastinal contours are within normal limits. Both lungs are clear. The visualized skeletal structures are unremarkable. IMPRESSION: No active cardiopulmonary disease. Electronically Signed   By: Lupita Raider M.D.   On: 10/12/2020 16:10   CT Angio Chest PE W and/or Wo Contrast  Result Date: 10/23/2020 CLINICAL DATA:  Diaphoresis, lightheadedness, chest pain, and shortness of breath. CABG 1 week ago. EXAM: CT ANGIOGRAPHY CHEST WITH CONTRAST TECHNIQUE: Multidetector CT imaging of the chest was performed using the standard protocol during bolus administration of intravenous contrast.  Multiplanar CT image reconstructions and MIPs were obtained to evaluate the vascular anatomy. CONTRAST:  75mL OMNIPAQUE IOHEXOL 350 MG/ML SOLN COMPARISON:  Chest x-ray October 23, 2020. FINDINGS: Cardiovascular: The thoracic aorta is normal in caliber with no aneurysm or dissection. No significant  atherosclerotic change identified. Cardiomegaly is noted. There appear to be a few small emboli in the left lower lobe. Three pulmonary emboli are identified in the left base. No other emboli identified outside of the left base. Mediastinum/Nodes: Postoperative changes are seen in the mediastinum. No pleural effusions. No suspicious pericardial effusion. No adenopathy. The esophagus and thyroid are normal. Lungs/Pleura: Bronchial wall thickening in the bases is identified. Central airways are otherwise normal. No pneumothorax. Mild dependent atelectasis. No nodules, masses, or focal infiltrates. Upper Abdomen: Possible sludge or stones in the gallbladder without wall thickening or adjacent stranding. Probable cysts in the liver, too small to characterize. Musculoskeletal: No chest wall abnormality. No acute or significant osseous findings. Review of the MIP images confirms the above findings. IMPRESSION: 1. Three left lower lobe pulmonary emboli, 1 segmental and 2 subsegmental. 2. Cardiomegaly. 3. Postoperative changes in the mediastinum after CABG 1 week ago. 4. Bronchial wall thickening in the bases, nonspecific. 5. Suggested sludge or stones in the gallbladder without wall thickening. Findings called to the patient's ER physician. Electronically Signed   By: Gerome Sam III M.D.   On: 10/23/2020 15:02   DG Chest Portable 1 View  Result Date: 10/23/2020 CLINICAL DATA:  Chest pain, diaphoresis, and shortness of breath aortic valve replacement and CABG on 10/14/2020 EXAM: PORTABLE CHEST 1 VIEW COMPARISON:  10/19/2020 FINDINGS: Aortic valve and markers from recent CABG noted. No sternal wire migration. No current  cardiomegaly or edema. The lungs appear clear there no blunting of the costophrenic angles. No pneumothorax or obvious radiographic complicating features from recent CABG noted. IMPRESSION: 1. No acute findings. 2. Aortic valve prosthesis and markers from recent CABG. No edema or cardiomegaly currently. Electronically Signed   By: Gaylyn Rong M.D.   On: 10/23/2020 13:03   DG Chest Port 1 View  Result Date: 10/16/2020 CLINICAL DATA:  Postoperative for aortic valve placement. EXAM: PORTABLE CHEST 1 VIEW COMPARISON:  October 15, 2020 FINDINGS: A right jugular vascular sheath is in place with distal tip in the superior vena cava. Previously noted life supporting devices have been removed. Patient status post prior median sternotomy/CABG/aortic valve replacement. Minimal atelectasis is identified in the left lung base. The right is clear. There is no pneumothorax. Osseous structures are normal. IMPRESSION: A right jugular vascular sheath is in place with distal tip in the superior vena cava. Minimal atelectasis of left lung base. Electronically Signed   By: Sherian Rein M.D.   On: 10/16/2020 08:45   DG Chest Port 1 View  Result Date: 10/15/2020 CLINICAL DATA:  Status post CABG x4. EXAM: PORTABLE CHEST 1 VIEW COMPARISON:  1 day prior FINDINGS: Extubation and removal of nasogastric tube. Right IJ Swan-Ganz catheter remains, tip pulmonary outflow tract. Mediastinal drain and 2 left chest tubes unchanged. Median sternotomy for CABG and aortic valve repair. Midline trachea. Borderline cardiomegaly. No pleural effusion or pneumothorax. Diminished lung volumes. Persistent left and developing right base atelectasis. No congestive failure. IMPRESSION: Removal of support apparatus, including endotracheal tube. Likely secondary diminished lung volumes with persistent left and increased right base atelectasis. No pneumothorax or other acute complication. Electronically Signed   By: Jeronimo Greaves M.D.   On:  10/15/2020 08:44   DG Chest Port 1 View  Result Date: 10/14/2020 CLINICAL DATA:  Post CABG x4 and AVR. EXAM: PORTABLE CHEST 1 VIEW COMPARISON:  Chest x-ray 10/12/2020. FINDINGS: There is a new endotracheal tube with distal tip 5.5 cm above the carina. Enteric tube extends below the diaphragm.  Swan-Ganz catheter tip projects over the main pulmonary artery. There are new sternotomy wires, mediastinal clips, mediastinal drain and left pleural drain. There is a new heart pelvis well. The cardiomediastinal silhouette is prominent, likely related to recent surgery. There is some patchy opacities in the left lung base. There is no definite pleural effusion or pneumothorax visualized on the supine view. No acute fractures are seen. IMPRESSION: 1. Postoperative changes.  Adequate positioning of lines and tubes. 2. Left basilar atelectasis/airspace disease. Electronically Signed   By: Darliss Cheney M.D.   On: 10/14/2020 15:15   ECHOCARDIOGRAM COMPLETE  Result Date: 10/24/2020    ECHOCARDIOGRAM REPORT   Patient Name:   Vincent Guerrero Date of Exam: 10/24/2020 Medical Rec #:  [id number]           Height:       70.0 in Accession #:    [id number]          Weight:       184.3 lb Date of Birth:  [birth year]          BSA:          2.016 m Patient Age:    59 years            BP:           116/82 mmHg Patient Gender: M                   HR:           73 bpm. Exam Location:  Inpatient Procedure: 2D Echo, Cardiac Doppler and Color Doppler Indications:    Pulmonary embolus  History:        Patient has prior history of Echocardiogram examinations, most                 recent 10/14/2020. CAD, Prior CABG, Signs/Symptoms:Shortness of                 Breath and Chest Pain; Risk Factors:Hypertension, Diabetes,                 Dyslipidemia and Current Smoker. AVR - INSPIRIS RESILIA                 AORTIC VALVE (10/14/20). Hx CVA.  Sonographer:    Ross Ludwig RDCS (AE) Referring Phys: [id number] ANKIT NANAVATI IMPRESSIONS  1. Septal  hypokinesis. . Left ventricular ejection fraction, by estimation, is 50 to 55%. The left ventricle has low normal function. There is severe left ventricular hypertrophy. Left ventricular diastolic parameters are consistent with Grade I diastolic dysfunction (impaired relaxation).  2. Right ventricular systolic function is normal. The right ventricular size is normal.  3. Trivial mitral valve regurgitation.  4. S/p AVR (25 mm Inspiris Resilia valve (10/14/20)). Valve leaflets open well.. The aortic valve has been repaired/replaced. Aortic valve regurgitation is not visualized. FINDINGS  Left Ventricle: Septal hypokinesis. Left ventricular ejection fraction, by estimation, is 50 to 55%. The left ventricle has low normal function. The left ventricular internal cavity size was normal in size. There is severe left ventricular hypertrophy. Left ventricular diastolic parameters are consistent with Grade I diastolic dysfunction (impaired relaxation). Right Ventricle: The right ventricular size is normal. Right vetricular wall thickness was not assessed. Right ventricular systolic function is normal. Left Atrium: Left atrial size was normal in size. Right Atrium: Right atrial size was normal in size. Pericardium: There is no evidence of pericardial effusion. Mitral Valve: There is mild thickening of the mitral  valve leaflet(s). Mild mitral annular calcification. Trivial mitral valve regurgitation. Tricuspid Valve: The tricuspid valve is normal in structure. Tricuspid valve regurgitation is trivial. Aortic Valve: S/p AVR (25 mm Inspiris Resilia valve (10/14/20)). Valve leaflets open well. The aortic valve has been repaired/replaced. Aortic valve regurgitation is not visualized. Aortic valve mean gradient measures 9.0 mmHg. Aortic valve peak gradient measures 17.5 mmHg. Aortic valve area, by VTI measures 2.20 cm. Pulmonic Valve: The pulmonic valve was normal in structure. Pulmonic valve regurgitation is not visualized. Aorta: The  aortic root and ascending aorta are structurally normal, with no evidence of dilitation. IAS/Shunts: No atrial level shunt detected by color flow Doppler.  LEFT VENTRICLE PLAX 2D LVIDd:         4.40 cm  Diastology LVIDs:         3.20 cm  LV e' medial:    4.35 cm/s LV PW:         1.70 cm  LV E/e' medial:  22.3 LV IVS:        1.90 cm  LV e' lateral:   7.29 cm/s LVOT diam:     2.40 cm  LV E/e' lateral: 13.3 LV SV:         75 LV SV Index:   37 LVOT Area:     4.52 cm  RIGHT VENTRICLE RV Basal diam:  3.50 cm RV S prime:     6.73 cm/s TAPSE (M-mode): 1.6 cm LEFT ATRIUM           Index       RIGHT ATRIUM           Index LA diam:      4.60 cm 2.28 cm/m  RA Area:     10.00 cm LA Vol (A4C): 52.5 ml 26.04 ml/m RA Volume:   16.50 ml  8.18 ml/m  AORTIC VALVE AV Area (Vmax):    2.40 cm AV Area (Vmean):   2.09 cm AV Area (VTI):     2.20 cm AV Vmax:           209.00 cm/s AV Vmean:          139.000 cm/s AV VTI:            0.341 m AV Peak Grad:      17.5 mmHg AV Mean Grad:      9.0 mmHg LVOT Vmax:         111.00 cm/s LVOT Vmean:        64.100 cm/s LVOT VTI:          0.166 m LVOT/AV VTI ratio: 0.49  AORTA Ao Root diam: 3.20 cm Ao Asc diam:  3.50 cm MITRAL VALVE MV Area (PHT): 3.01 cm     SHUNTS MV Decel Time: 252 msec     Systemic VTI:  0.17 m MV E velocity: 97.20 cm/s   Systemic Diam: 2.40 cm MV A velocity: 103.00 cm/s MV E/A ratio:  0.94 Dietrich Pates MD Electronically signed by Dietrich Pates MD Signature Date/Time: 10/24/2020/3:08:04 PM    Final    ECHO INTRAOPERATIVE TEE  Result Date: 10/14/2020  *INTRAOPERATIVE TRANSESOPHAGEAL REPORT *  Patient Name:   Vincent Guerrero Date of Exam: 10/14/2020 Medical Rec #:  [id number]           Height:       71.0 in Accession #:    [id number]          Weight:       195.0 lb Date of Birth:  1961-02-02          BSA:          2.09 m Patient Age:    59 years            BP:           191/77 mmHg Patient Gender: M                   HR:           77 bpm. Exam Location:  Inpatient Transesophogeal  exam was perform intraoperatively during surgical procedure. Patient was closely monitored under general anesthesia during the entirety of examination. Indications:     aortic valve surgery Performing Phys: Shona Simpson MD Diagnosing Phys: Shona Simpson MD Complications: No known complications during this procedure. POST-OP IMPRESSIONS _ Left Ventricle: LVEF unchanged (55-60%), no RWMA's noted. CO > 5.0 L/min, CI > 2.5 L/min/m. _ Right Ventricle: The right ventricle appears unchanged from pre-bypass. _ Aorta: No dissection noted after cannula removed. _ Left Atrium: The left atrium appears unchanged from pre-bypass. _ Aortic Valve: A Inspiris Resilia bioprosthetic valve was placed, leaflets are freely mobile (size: 63mm). No regurgitation post repair. The gradient recorded across the prosthetic valve is within the expected range. No perivalvular leak noted. _ Mitral Valve: The mitral valve appears unchanged from pre-bypass. _ Tricuspid Valve: The tricuspid valve appears unchanged from pre-bypass.  PRE-OP FINDINGS  Left Ventricle: The left ventricle has normal systolic function, with an ejection fraction of 55-60%. The cavity size was normal. No evidence of left ventricular regional wall motion abnormalities. There is moderate to severe concentric left ventricular  hypertrophy of the septal, apical, lateral and posterior segments. Left ventricular diastolic function could not be evaluated. Right Ventricle: The right ventricle has normal systolic function. The cavity was normal. There is no increase in right ventricular wall thickness. Right ventricular systolic pressure is normal. There is no aneurysm seen. Left Atrium: Left atrial size was normal in size. No left atrial/left atrial appendage thrombus was detected. The left atrial appendage is well visualized and there is no evidence of thrombus present. Left atrial appendage velocity is normal at greater than 40 cm/s. Right Atrium: Right atrial size was normal in  size. PA catheter traversing the right atrium into the right ventricle. Interatrial Septum: No atrial level shunt detected by color flow Doppler. There is no evidence of a patent foramen ovale. Pericardium: There is no evidence of pericardial effusion. There is no pleural effusion. Mitral Valve: The mitral valve is normal in structure. Mitral valve regurgitation is trivial by color flow Doppler. The MR jet is centrally-directed. There is no evidence of mitral valve vegetation. There is no evidence of mitral stenosis. Tricuspid Valve: The tricuspid valve was normal in structure. Tricuspid valve regurgitation was not visualized by color flow Doppler. Aortic Valve: The aortic valve is tricuspid with significant calcfication on the distal aspect of the left, right and non coronary leaflets. Aortic valve regurgitation is mild by color flow Doppler. The jet is centrally-directed. There is moderate stenosis of the aortic valve. Peak gradient 40 mmhg, Mean gradient > 20 mmhg. Pulmonic Valve: The pulmonic valve was normal in structure. Pulmonic valve regurgitation is not visualized by color flow Doppler. Aorta: The ascending aorta, aortic arch and aortic root are normal in size and structure. Pulmonary Artery: Theone Murdoch catheter present on the right. The pulmonary artery is of normal size. Venous: The inferior vena cava was not well visualized. Shunts: There is no  evidence of an atrial septal defect. +--------------+-------++ LEFT VENTRICLE        +--------------+-------++ PLAX 2D               +--------------+-------++ LVIDd:        5.30 cm +--------------+-------++ LVIDs:        3.50 cm +--------------+-------++ LV SV:        84 ml   +--------------+-------++ LV SV Index:  39.86   +--------------+-------++                       +--------------+-------++ +------------------+------------++ AORTIC VALVE                   +------------------+------------++ AV Vmax:          226.67 cm/s   +------------------+------------++ AV Vmean:         156.333 cm/s +------------------+------------++ AV VTI:           0.468 m      +------------------+------------++ AV Peak Grad:     20.6 mmHg    +------------------+------------++ AV Mean Grad:     23.0 mmHg    +------------------+------------++ LVOT Vmax:        360.00 cm/s  +------------------+------------++ LVOT Vmean:       254.000 cm/s +------------------+------------++ LVOT VTI:         0.923 m      +------------------+------------++ LVOT/AV VTI ratio:1.97         +------------------+------------++  +-------------+------+ SHUNTS              +-------------+------+ Systemic VTI:0.92 m +-------------+------+  Shona Simpson MD Electronically signed by Shona Simpson MD Signature Date/Time: 10/14/2020/4:19:28 PM    Final    VAS US DOPPLER PRE CABG  Result Date: 10/12/2020 PREOPERATIVE VASCULAR EVALUATION Patient Name:  Vincent Guerrero  Date of Exam:   10/12/2020 Medical Rec #: [id number]            Accession #:    [id number] Date of Birth: 27-Aug-1960           Patient Gender: M Patient Age:   12 years Exam Location:  [facility] Procedure:      VAS US DOPPLER PRE CABG Referring Phys: Evelene Croon --------------------------------------------------------------------------------  Indications:      Pre-CABG. Risk Factors:     Hypertension, hyperlipidemia, Diabetes, current smoker,                   coronary artery disease. Other Factors:    HX of cardiac stent placement, severe AO valve stenosis. Comparison Study: No previous exams Performing Technologist: Ernestene Mention RVT/RDMS  Examination Guidelines: A complete evaluation includes B-mode imaging, spectral Doppler, color Doppler, and power Doppler as needed of all accessible portions of each vessel. Bilateral testing is considered an integral part of a complete examination. Limited examinations for reoccurring indications may be performed as noted.  Right Carotid Findings:  +----------+--------+-------+--------+----------------------+------------------+           PSV cm/sEDV    StenosisDescribe              Comments                             cm/s                                                    +----------+--------+-------+--------+----------------------+------------------+  CCA Prox  91      12                                   intimal thickening +----------+--------+-------+--------+----------------------+------------------+ CCA Distal75      15                                   intimal thickening +----------+--------+-------+--------+----------------------+------------------+ ICA Prox  54      16             heterogenous and                                                          smooth                                   +----------+--------+-------+--------+----------------------+------------------+ ICA Distal56      20                                                      +----------+--------+-------+--------+----------------------+------------------+ ECA       78      7                                                       +----------+--------+-------+--------+----------------------+------------------+ +----------+--------+-------+----------------+------------+           PSV cm/sEDV cmsDescribe        Arm Pressure +----------+--------+-------+----------------+------------+ [id number]             Multiphasic, WNL             +----------+--------+-------+----------------+------------+ +---------+--------+--+--------+--+---------+ VertebralPSV cm/s69EDV cm/s25Antegrade +---------+--------+--+--------+--+---------+ Left Carotid Findings: +----------+--------+-------+--------+----------------------+------------------+           PSV cm/sEDV    StenosisDescribe              Comments                             cm/s                                                     +----------+--------+-------+--------+----------------------+------------------+ CCA Prox  133     17                                   intimal thickening +----------+--------+-------+--------+----------------------+------------------+ CCA Distal65      10  intimal thickening +----------+--------+-------+--------+----------------------+------------------+ ICA Prox  278     82     60-79%  calcific and                                                              hypoechoic                               +----------+--------+-------+--------+----------------------+------------------+ ICA Mid   171     63     40-59%  heterogenous                             +----------+--------+-------+--------+----------------------+------------------+ ICA Distal64      24                                                      +----------+--------+-------+--------+----------------------+------------------+ ECA       83      1                                                       +----------+--------+-------+--------+----------------------+------------------+ +----------+--------+--------+----------------+------------+ SubclavianPSV cm/sEDV cm/sDescribe        Arm Pressure +----------+--------+--------+----------------+------------+           138             Multiphasic, WNL             +----------+--------+--------+----------------+------------+ +---------+--------+--------+--------+ VertebralPSV cm/sEDV cm/soccluded +---------+--------+--------+--------+  ABI Findings: +---------+------------------+-----+----------+--------+ Right    Rt Pressure (mmHg)IndexWaveform  Comment  +---------+------------------+-----+----------+--------+ Brachial 124                    triphasic          +---------+------------------+-----+----------+--------+ PTA      117               0.88 monophasic          +---------+------------------+-----+----------+--------+ DP       115               0.86 monophasic         +---------+------------------+-----+----------+--------+ Great Toe63                0.47 Abnormal           +---------+------------------+-----+----------+--------+ +---------+------------------+-----+----------+-------+ Left     Lt Pressure (mmHg)IndexWaveform  Comment +---------+------------------+-----+----------+-------+ Brachial 133                    triphasic         +---------+------------------+-----+----------+-------+ PTA      112               0.84 monophasic        +---------+------------------+-----+----------+-------+ DP       110               0.83 monophasic        +---------+------------------+-----+----------+-------+ Anne Hahn  0.47 Abnormal          +---------+------------------+-----+----------+-------+ +-------+---------------+----------------+ ABI/TBIToday's ABI/TBIPrevious ABI/TBI +-------+---------------+----------------+ Right  0.86/0.47                       +-------+---------------+----------------+ Left   0.83/0.47                       +-------+---------------+----------------+  Right Doppler Findings: +----------+--------+-----+---------+--------+ Site      PressureIndexDoppler  Comments +----------+--------+-----+---------+--------+ [id number]          triphasic         +----------+--------+-----+---------+--------+ Brachial  124          triphasic         +----------+--------+-----+---------+--------+ Radial                 triphasic         +----------+--------+-----+---------+--------+ Ulnar                  triphasic         +----------+--------+-----+---------+--------+  Left Doppler Findings: +----------+--------+-----+---------+--------+ Site      PressureIndexDoppler  Comments +----------+--------+-----+---------+--------+ [id number]          triphasic          +----------+--------+-----+---------+--------+ Brachial  133          triphasic         +----------+--------+-----+---------+--------+ Radial                 triphasic         +----------+--------+-----+---------+--------+ Ulnar                  triphasic         +----------+--------+-----+---------+--------+  Summary: Right Carotid: The extracranial vessels were near-normal with only minimal wall                thickening or plaque. Left Carotid: Velocities in the left ICA are consistent with a 60-79% stenosis.               Non-hemodynamically significant plaque <50% noted in the CCA. The               ECA appears <50% stenosed. Vertebrals:  Right vertebral artery demonstrates antegrade flow. Left vertebral              artery demonstrates an occlusion. Subclavians: Normal flow hemodynamics were seen in the right subclavian artery.              Elevated velocities without evidence of stenosis seen in the left              subclavian artery. Right ABI: Resting right ankle-brachial index indicates mild right lower extremity arterial disease. The right toe-brachial index is abnormal. ABI shows only mild disease, however waveforms are monophasic. TBI indicates moderate disease. Left ABI: Resting left ankle-brachial index indicates mild left lower extremity arterial disease. The left toe-brachial index is abnormal. ABI shows only mild disease, however waveforms are monophasic. TBI indicates moderate disease. Right Upper Extremity: Normal PPG waveforms with radial artery compression suggest palmar arch patency. Doppler waveforms remain within normal limits with right radial compression. Doppler waveforms remain within normal limits with right ulnar compression. Left Upper Extremity: Normal PPG waveforms with radial artery compression suggest palmar arch patency. Doppler waveforms remain within normal limits with left radial compression. Doppler waveforms remain within normal limits with left  ulnar compression.  Electronically signed by Sherald Hess MD on 10/12/2020 at 6:53:34  PM.    Final    VAS [place] LOWER EXTREMITY VENOUS (DVT) (ONLY [facility] & [facility] 7a-7p)  Result Date: 10/23/2020  Lower Venous DVT Study Patient Name:  Vincent Guerrero  Date of Exam:   10/23/2020 Medical Rec #: [id number]            Accession #:    [id number] Date of Birth: Feb 19, 1960           Patient Gender: M Patient Age:   80 years Exam Location:  [facility] Procedure:      VAS [place] LOWER EXTREMITY VENOUS (DVT) Referring Phys: Janey Genta NANAVATI --------------------------------------------------------------------------------  Indications: Edema.  Comparison Study: no prior Performing Technologist: [place] Ponder RVS  Examination Guidelines: A complete evaluation includes B-mode imaging, spectral Doppler, color Doppler, and power Doppler as needed of all accessible portions of each vessel. Bilateral testing is considered an integral part of a complete examination. Limited examinations for reoccurring indications may be performed as noted. The reflux portion of the exam is performed with the patient in reverse Trendelenburg.  +---------+---------------+---------+-----------+----------+--------------+ RIGHT    CompressibilityPhasicitySpontaneityPropertiesThrombus Aging +---------+---------------+---------+-----------+----------+--------------+ CFV      Full           Yes      Yes                                 +---------+---------------+---------+-----------+----------+--------------+ SFJ      Full                                                        +---------+---------------+---------+-----------+----------+--------------+ FV Prox  Full                                                        +---------+---------------+---------+-----------+----------+--------------+ FV Mid   Full                                                         +---------+---------------+---------+-----------+----------+--------------+ FV DistalFull                                                        +---------+---------------+---------+-----------+----------+--------------+ PFV      Full                                                        +---------+---------------+---------+-----------+----------+--------------+ POP      Full           Yes      Yes                                 +---------+---------------+---------+-----------+----------+--------------+  PTV      Full                                                        +---------+---------------+---------+-----------+----------+--------------+ PERO     Full                                                        +---------+---------------+---------+-----------+----------+--------------+   +----+---------------+---------+-----------+----------+--------------+ LEFTCompressibilityPhasicitySpontaneityPropertiesThrombus Aging +----+---------------+---------+-----------+----------+--------------+ CFV Full           Yes      Yes                                 +----+---------------+---------+-----------+----------+--------------+     Summary: RIGHT: - There is no evidence of deep vein thrombosis in the lower extremity.  - No cystic structure found in the popliteal fossa.  LEFT: - No evidence of common femoral vein obstruction.  *See table(s) above for measurements and observations. Electronically signed by Gerarda Fraction on 10/23/2020 at 2:56:53 PM.    Final       HISTORY:   Allergies:  Allergies  Allergen Reactions   Cetacaine [Butamben-Tetracaine-Benzocaine] Shortness Of Breath and Other (See Comments)    Intense laryngospasm    Current Medications: Current Outpatient Medications  Medication Sig Dispense Refill   amiodarone (PACERONE) 200 MG tablet Please take 2 tablets (400 mg) twice daily on 9/21 only, then take 2 tablets (400 mg) once daily thereafter.  (Patient taking differently: Take 400 mg by mouth 2 (two) times daily. Please take 2 tablets (400 mg) twice daily on 9/21 only, then take 2 tablets (400 mg) once daily thereafter.) 64 tablet 1   APIXABAN (ELIQUIS) VTE STARTER PACK (10MG  AND 5MG ) Take as directed on package: start with two-5mg  tablets twice daily for 7 days. On day 8, switch to one-5mg  tablet twice daily. (Patient taking differently: Take 5 mg by mouth 2 (two) times daily. Take as directed on package: start with two-5mg  tablets twice daily for 7 days. On day 8, switch to one-5mg  tablet twice daily.) 74 tablet 0   aspirin EC 81 MG tablet Take 1 tablet (81 mg total) by mouth daily. Swallow whole. 150 tablet 2   Aspirin-Caffeine (BC FAST PAIN RELIEF PO) Take 1 tablet by mouth as needed (fever).     atorvastatin (LIPITOR) 80 MG tablet Take 1 tablet (80 mg total) by mouth daily. 30 tablet 1   empagliflozin (JARDIANCE) 10 MG TABS tablet Take 1 tablet (10 mg total) by mouth daily. 30 tablet 1   isosorbide mononitrate (IMDUR) 60 MG 24 hr tablet Take 60 mg by mouth daily.     latanoprost (XALATAN) 0.005 % ophthalmic solution Place 1 drop into the right eye at bedtime.     metFORMIN (GLUCOPHAGE) 1000 MG tablet Take 1,000 mg by mouth 2 (two) times daily with a meal.     metoprolol (LOPRESSOR) 50 MG tablet Take 1 tablet (50 mg total) by mouth 2 (two) times daily. 180 tablet 2   oxyCODONE (OXY IR/ROXICODONE) 5 MG immediate release tablet Take 1 tablet (5 mg total) by mouth every 6 (  six) hours as needed for severe pain. (Patient taking differently: Take 5 mg by mouth every 6 (six) hours as needed for severe pain or moderate pain.) 28 tablet 0   pantoprazole (PROTONIX) 40 MG tablet Take 1 tablet (40 mg total) by mouth daily. (Patient taking differently: Take 40 mg by mouth daily before breakfast.) 90 tablet 3   PATADAY 0.2 % SOLN Place 1 drop into the right eye 2 (two) times daily.     Tiotropium Bromide Monohydrate (SPIRIVA RESPIMAT) 2.5 MCG/ACT AERS  Inhale 1 puff into the lungs 2 (two) times daily.     valsartan (DIOVAN) 40 MG tablet Take 1 tablet (40 mg total) by mouth daily. 90 tablet 3   No current facility-administered medications for this visit.     ASSESSMENT & PLAN:   Assessment: 1. Iron deficiency anemia, felt to be due to chronic GI blood loss and malabsorption.  He had good improvement in his iron stores with IV Feraheme in June. We will schedule him for this again.  2. Severe aortic stenosis requiring aortic valve replacement and coronary artery bypass grafting on September 9th.   3. Three left lower lobe pulmonary emboli, 1 segmental and 2 subsegmental, September 2022. He has been placed on Eliquis starting at 10 mg BID to then be decreased to 5 mg daily.   4. Atrial fibrillation, resolved. He continues amiodarone 400 mg daily.  5. Vitamin-D insufficiency, he is on vitamin D3 1000 international units daily.  Plan:   We will schedule him for IV iron in the forthcoming weeks. For his productive cough, I advised that he use Mucinex. Otherwise, we will plan to see him back in 1-2 months with a CBC, comprehensive metabolic panel and iron studies for repeat clinical assessment. If all is well, we could go to follow up every 3-4 months. The patient understands the plans discussed today and is in agreement with them.  He knows to contact our office if he develops symptoms of worsening anemia or other concerns prior to his next appointment.   I, Foye Deer, am acting as scribe for Dellia Beckwith, MD  I have reviewed this report as typed by the medical scribe, and it is complete and accurate.

## 2020-10-31 ENCOUNTER — Encounter: Payer: Self-pay | Admitting: Cardiology

## 2020-10-31 ENCOUNTER — Other Ambulatory Visit: Payer: Self-pay

## 2020-10-31 ENCOUNTER — Ambulatory Visit (INDEPENDENT_AMBULATORY_CARE_PROVIDER_SITE_OTHER): Payer: Medicaid Other | Admitting: Cardiology

## 2020-10-31 VITALS — BP 146/90 | HR 102 | Ht 70.0 in | Wt 189.4 lb

## 2020-10-31 DIAGNOSIS — Z951 Presence of aortocoronary bypass graft: Secondary | ICD-10-CM | POA: Diagnosis not present

## 2020-10-31 DIAGNOSIS — I480 Paroxysmal atrial fibrillation: Secondary | ICD-10-CM

## 2020-10-31 DIAGNOSIS — E785 Hyperlipidemia, unspecified: Secondary | ICD-10-CM | POA: Diagnosis not present

## 2020-10-31 DIAGNOSIS — Z952 Presence of prosthetic heart valve: Secondary | ICD-10-CM

## 2020-10-31 DIAGNOSIS — I2609 Other pulmonary embolism with acute cor pulmonale: Secondary | ICD-10-CM

## 2020-10-31 DIAGNOSIS — Z720 Tobacco use: Secondary | ICD-10-CM

## 2020-10-31 NOTE — Patient Instructions (Signed)
Medication Instructions:  Your physician recommends that you continue on your current medications as directed. Please refer to the Current Medication list given to you today.  *If you need a refill on your cardiac medications before your next appointment, please call your pharmacy*   Lab Work: Your physician recommends that you return for lab work in:  TODAY: BMET, Mag, CBC If you have labs (blood work) drawn today and your tests are completely normal, you will receive your results only by: MyChart Message (if you have MyChart) OR A paper copy in the mail If you have any lab test that is abnormal or we need to change your treatment, we will call you to review the results.   Testing/Procedures: None   Follow-Up: At [facility], you and your health needs are our priority.  As part of our continuing mission to provide you with exceptional heart care, we have created designated Provider Care Teams.  These Care Teams include your primary Cardiologist (physician) and Advanced Practice Providers (APPs -  Physician Assistants and Nurse Practitioners) who all work together to provide you with the care you need, when you need it.  We recommend signing up for the patient portal called "MyChart".  Sign up information is provided on this After Visit Summary.  MyChart is used to connect with patients for Virtual Visits (Telemedicine).  Patients are able to view lab/test results, encounter notes, upcoming appointments, etc.  Non-urgent messages can be sent to your provider as well.   To learn more about what you can do with MyChart, go to [email or web address].    Your next appointment:   10 week(s)  The format for your next appointment:   In Person  Provider:   Thomasene Ripple, DO [address], [place], [place] [address]    Other Instructions

## 2020-10-31 NOTE — Progress Notes (Signed)
Cardiology Office Note:    Date:  10/31/2020   ID:  Vincent Guerrero, DOB 1961-01-19, MRN [id number]  PCP:  Pcp, No  Cardiologist:  Thomasene Ripple, DO  Electrophysiologist:  None   Referring MD: No ref. provider found   " I don't feel too good"  History of Present Illness:    Vincent Guerrero is a 60 y.o. male with a hx of  coronary artery disease status post stent in the RCA a result of an MI in 2010, hyperlipidemia (not on statins due to statin intolerance), diabetes mellitus (on Metformin and glipizide),, current smoker CVA in 2011, moderate aortic stenosis in April 2018.    In October the patient was short of breath were concerned that this may have been coronary artery disease therefore I set the patient up for a left heart catheterization but unfortunately his hemoglobin was significantly low.    He was seen by with GI he has had a colonoscopy which showed some ulcers.     When I saw the patient in March 2022 he had stopped his Repatha as well as his Imdur.  He felt both of these medication was playing with his mind.  I therefore placed the patient on Ranexa to help with any angina symptoms.  He had some wheezing that they and I referred the patient to pulmonary as he was not treated for likely undiagnosed COPD given his long smoking history.  His blood pressure was also elevated that day when asked the patient said he had not taken his blood pressure medication.   I saw the patient on June 02, 2020 he had complained of some shortness of breath and chest tightness at the time my office he has been wheezing.  He reported he had not been using his inhalers.  Placed a referral for pulmonary as well.  During that visit he had not been taking the Ranexa as recommended and he had previously stopped the Imdur.  His hemoglobin was also low during that visit which appeared to be dropping.  And he had stopped the Protonix.  The patient was here with his fiance that day I explained to him that  for his angina he needed to stay on the Ranexa and also hopefully if he could tolerate the Imdur.  I restarted on the Ranexa at that visit.  I also explained to the patient that giving his history of GI bleeding he would need to stay on the Protonix.     I saw the patient in May 2022 at that time we will set up for him to see GI as well as hematology.  He missed his GI appointment but was able to see hematology and has been started on iron treatment.  Since I saw the patient he had recurrent chest pain syndrome we pursued concern for symptomatic aortic valve.  He underwent various testing including TEE, right and left heart catheterization ultimately he is status post surgical aortic valve replacement with coronary artery bypass grafting x4 which was done on October 14, 2020.  Dominant surgical.  The patient hospital stay was complicated with postop atrial fibrillation and was started on amiodarone.  Postsurgery the patient was hospitalized at [facility] after he presented for shortness of breath and chest pain.  He was noted to have multiple segmental pulmonary embolism.  And has been started on apixaban.  He is here today for follow-up visit.  Since his hospitalization he has not been doing very well.  He did  deeply fatigued.  He reports that he has had some muscle pain.  Past Medical History:  Diagnosis Date   Age-related nuclear cataract of both eyes 11/27/2018   Anemia    Bilateral recurrent inguinal hernia without obstruction or gangrene 05/09/2016   Added automatically from request for surgery [id number]   Coronary artery disease involving native coronary artery without angina pectoris 05/2016   History of PCI to nondominant RCA. ->  Cath in April 2018 showed occluded nondominant RCA stent.  30% LM.  pLAD 40%.  Ostial RI 60%.   Cortical age-related cataract of both eyes 11/27/2018   Depression    Diabetes mellitus (HCC)    Hyperlipidemia LDL goal <70    Hypertension    Inguinal  hernia without obstruction or gangrene    BILATERAL   Primary open angle glaucoma (POAG) of both eyes, severe stage 11/27/2018   Primary open angle glaucoma (POAG) of left eye, severe stage 11/27/2018   Stroke (HCC)    Tobacco abuse 11/05/2019   Tobacco use 11/06/2019    Past Surgical History:  Procedure Laterality Date   AORTIC VALVE REPLACEMENT N/A 10/14/2020   Procedure: AORTIC VALVE REPLACEMENT (AVR) USING INSPIRIS RESILIA AORTIC VALVE;  Surgeon: Alleen Borne, MD;  Location: [facility] OR;  Service: Open Heart Surgery;  Laterality: N/A;   BIOPSY  11/27/2019   Procedure: BIOPSY;  Surgeon: Sherrilyn Rist, MD;  Location: [facility] ENDOSCOPY;  Service: Gastroenterology;;   COLONOSCOPY WITH PROPOFOL N/A 11/27/2019   Procedure: COLONOSCOPY WITH PROPOFOL;  Surgeon: Sherrilyn Rist, MD;  Location: [facility] ENDOSCOPY;  Service: Gastroenterology;  Laterality: N/A;   CORONARY ARTERY BYPASS GRAFT N/A 10/14/2020   Procedure: CORONARY ARTERY BYPASS GRAFTING (CABG) TIMES FOUR, USING LEFT INTERNAL MAMMARY ARTERY AND RIGHT GREATER SAPHENOUS VEIN HARVESTED ENDOSCOPICALLY;  Surgeon: Alleen Borne, MD;  Location: [facility] OR;  Service: Open Heart Surgery;  Laterality: N/A;   ESOPHAGOGASTRODUODENOSCOPY (EGD) WITH PROPOFOL N/A 11/27/2019   Procedure: ESOPHAGOGASTRODUODENOSCOPY (EGD) WITH PROPOFOL;  Surgeon: Sherrilyn Rist, MD;  Location: [facility] ENDOSCOPY;  Service: Gastroenterology;  Laterality: N/A;   HEMOSTASIS CLIP PLACEMENT  11/27/2019   Procedure: HEMOSTASIS CLIP PLACEMENT;  Surgeon: Sherrilyn Rist, MD;  Location: [facility] ENDOSCOPY;  Service: Gastroenterology;;   HERNIA REPAIR  1998   INTRAVASCULAR ULTRASOUND/IVUS N/A 09/12/2020   Procedure: Intravascular Ultrasound/IVUS;  Surgeon: Marykay Lex, MD;  Location: [facility] INVASIVE CV LAB;  Service: Cardiovascular;  Laterality: N/A;   LEFT HEART CATH AND CORONARY ANGIOGRAPHY N/A 06/04/2016   Procedure: Left Heart Cath and Coronary Angiography;  Surgeon: Lyn Records, MD;   Location: [facility] INVASIVE CV LAB;  Service: Cardiovascular;  Laterality: N/A;   RIGHT/LEFT HEART CATH AND CORONARY ANGIOGRAPHY N/A 09/12/2020   Procedure: RIGHT/LEFT HEART CATH AND CORONARY ANGIOGRAPHY;  Surgeon: Marykay Lex, MD;  Location: [facility] INVASIVE CV LAB;  Service: Cardiovascular;  Laterality: N/A;   TEE WITHOUT CARDIOVERSION N/A 08/30/2020   Procedure: TRANSESOPHAGEAL ECHOCARDIOGRAM (TEE);  Surgeon: Elease Hashimoto Deloris Ping, MD;  Location: [facility] ENDOSCOPY;  Service: Cardiovascular;  Laterality: N/A;   TEE WITHOUT CARDIOVERSION N/A 10/14/2020   Procedure: TRANSESOPHAGEAL ECHOCARDIOGRAM (TEE);  Surgeon: Alleen Borne, MD;  Location: [facility] OR;  Service: Open Heart Surgery;  Laterality: N/A;    Current Medications: Current Meds  Medication Sig   amiodarone (PACERONE) 200 MG tablet Please take 2 tablets (400 mg) twice daily on 9/21 only, then take 2 tablets (400 mg) once daily thereafter. (Patient taking differently: Take 400 mg by  mouth 2 (two) times daily. Please take 2 tablets (400 mg) twice daily on 9/21 only, then take 2 tablets (400 mg) once daily thereafter.)   APIXABAN (ELIQUIS) VTE STARTER PACK (  AND ) Take as directed on package: start with two-5mg  tablets twice daily for 7 days. On day 8, switch to one-5mg  tablet twice daily. (Patient taking differently: Take 5 mg by mouth 2 (two) times daily. Take as directed on package: start with two-5mg  tablets twice daily for 7 days. On day 8, switch to one-5mg  tablet twice daily.)   aspirin EC 81 MG tablet Take 1 tablet (81 mg total) by mouth daily. Swallow whole.   Aspirin-Caffeine (BC FAST PAIN RELIEF PO) Take 1 tablet by mouth as needed (fever).   atorvastatin (LIPITOR) 80 MG tablet Take 1 tablet (80 mg total) by mouth daily.   empagliflozin (JARDIANCE) 10 MG TABS tablet Take 1 tablet (10 mg total) by mouth daily.   isosorbide mononitrate (IMDUR) 60 MG 24 hr tablet Take 60 mg by mouth daily.   latanoprost (XALATAN) 0.005 % ophthalmic solution Place 1  drop into the right eye at bedtime.   metFORMIN (GLUCOPHAGE) 1000 MG tablet Take 1,000 mg by mouth 2 (two) times daily with a meal.   metoprolol (LOPRESSOR) 50 MG tablet Take 1 tablet (50 mg total) by mouth 2 (two) times daily.   oxyCODONE (OXY IR/ROXICODONE) 5 MG immediate release tablet Take 1 tablet (5 mg total) by mouth every 6 (six) hours as needed for severe pain. (Patient taking differently: Take 5 mg by mouth every 6 (six) hours as needed for severe pain or moderate pain.)   pantoprazole (PROTONIX) 40 MG tablet Take 1 tablet (40 mg total) by mouth daily. (Patient taking differently: Take 40 mg by mouth daily before breakfast.)   PATADAY 0.2 % SOLN Place 1 drop into the right eye 2 (two) times daily.   Tiotropium Bromide Monohydrate (SPIRIVA RESPIMAT) 2.5 MCG/ACT AERS Inhale 1 puff into the lungs 2 (two) times daily.   valsartan (DIOVAN) 40 MG tablet Take 1 tablet (40 mg total) by mouth daily.     Allergies:   Cetacaine [butamben-tetracaine-benzocaine]   Social History   Socioeconomic History   Marital status: Legally Separated    Spouse name: Not on file   Number of children: Not on file   Years of education: Not on file   Highest education level: Not on file  Occupational History   Not on file  Tobacco Use   Smoking status: Every Day    Packs/day: 1.00    Years: 40.00    Pack years: 40.00    Types: Cigarettes   Smokeless tobacco: Former    Types: Chew   Tobacco comments:    half pack daily-05/05/2020-AH  Vaping Use   Vaping Use: Never used  Substance and Sexual Activity   Alcohol use: No   Drug use: Yes    Types: Marijuana    Comment: 1-2 times per month   Sexual activity: Yes  Other Topics Concern   Not on file  Social History Narrative   Not on file   Social Determinants of Health   Financial Resource Strain: Not on file  Food Insecurity: Not on file  Transportation Needs: Not on file  Physical Activity: Not on file  Stress: Not on file  Social  Connections: Not on file     Family History: The patient's family history includes CAD in his father; Diabetes in his father; Heart failure in his mother;  Hypertension in his mother; Prostate cancer in his brother.  ROS:   Review of Systems  Constitution: Negative for decreased appetite, fever and weight gain.  HENT: Negative for congestion, ear discharge, hoarse voice and sore throat.   Eyes: Negative for discharge, redness, vision loss in right eye and visual halos.  Cardiovascular: Negative for chest pain, dyspnea on exertion, leg swelling, orthopnea and palpitations.  Respiratory: Negative for cough, hemoptysis, shortness of breath and snoring.   Endocrine: Negative for heat intolerance and polyphagia.  Hematologic/Lymphatic: Negative for bleeding problem. Does not bruise/bleed easily.  Skin: Negative for flushing, nail changes, rash and suspicious lesions.  Musculoskeletal: Negative for arthritis, joint pain, muscle cramps, myalgias, neck pain and stiffness.  Gastrointestinal: Negative for abdominal pain, bowel incontinence, diarrhea and excessive appetite.  Genitourinary: Negative for decreased libido, genital sores and incomplete emptying.  Neurological: Negative for brief paralysis, focal weakness, headaches and loss of balance.  Psychiatric/Behavioral: Negative for altered mental status, depression and suicidal ideas.  Allergic/Immunologic: Negative for HIV exposure and persistent infections.    EKGs/Labs/Other Studies Reviewed:    The following studies were reviewed today:   EKG:  The ekg ordered today demonstrates   Recent Labs: 07/06/2020: TSH 1.171 10/15/2020: Magnesium 1.9 10/23/2020: ALT 18; B Natriuretic Peptide 168.0 10/25/2020: BUN 19; Creatinine, Ser 1.06; Hemoglobin 8.0; Platelets 564; Potassium 3.7; Sodium 137  Recent Lipid Panel    Component Value Date/Time   CHOL 82 11/27/2019 1004   CHOL 172 12/10/2018 1058   TRIG 60 11/27/2019 1004   HDL 37 (L) 11/27/2019  1004   HDL 43 12/10/2018 1058   CHOLHDL 2.2 11/27/2019 1004   VLDL 12 11/27/2019 1004   LDLCALC 33 11/27/2019 1004   LDLCALC 100 (H) 12/10/2018 1058    Physical Exam:    VS:  BP (!) 146/90 (BP Location: Left Arm, Patient Position: Sitting)   Pulse (!) 102   Ht 5\' 10"  (1.778 m)   Wt 189 lb 6.4 oz (85.9 kg)   SpO2 90%   BMI 27.18 kg/m     Wt Readings from Last 3 Encounters:  10/31/20 189 lb 6.4 oz (85.9 kg)  10/25/20 187 lb 14.4 oz (85.2 kg)  10/19/20 192 lb 12.8 oz (87.5 kg)     GEN: Well nourished, well developed in no acute distress HEENT: Normal NECK: No JVD; No carotid bruits LYMPHATICS: No lymphadenopathy CARDIAC: S1S2 noted,RRR, no murmurs, rubs, gallops RESPIRATORY:  Clear to auscultation without rales, wheezing or rhonchi  ABDOMEN: Soft, non-tender, non-distended, +bowel sounds, no guarding. EXTREMITIES: No edema, No cyanosis, no clubbing MUSCULOSKELETAL:  No deformity  SKIN: Warm and dry NEUROLOGIC:  Alert and oriented x 3, non-focal PSYCHIATRIC:  Normal affect, good insight  ASSESSMENT:    1. Hyperlipidemia LDL goal <70   2. S/P AVR (aortic valve replacement)   3. S/P CABG x 4   4. Paroxysmal atrial fibrillation (HCC)   5. Other acute pulmonary embolism with acute cor pulmonale (HCC)   6. Tobacco use    PLAN:    He is recovering steadily since his surgery.  His shortness of breath actually had improved since the hospitalization since being diagnosed with pulmonary embolism.  We will continue patient on his anticoagulation with Eliquis.  For now I will keep him on the amiodarone 400 mg daily for his postop atrial fibrillation.  I plan to stop the amiodarone hopefully at his next visit.  We will refer him to rehab at [facility] but I have asked the patient  to wait until his follow-up visit with CT surgery before scheduling his first rehab appointment.  His blood pressure is slightly elevated in the office today.  But he tells me at home is not at this  time so we will have the patient take his blood pressure daily and send me that information.  She does continue to be elevated I plan to increase his valsartan to 80 mg daily.  Diabetes mellitus-insulin managed by his primary care provider.  Smoking cessation advised.  The patient is in agreement with the above plan. The patient left the office in stable condition.  The patient will follow up in 10 weeks.   Medication Adjustments/Labs and Tests Ordered: Current medicines are reviewed at length with the patient today.  Concerns regarding medicines are outlined above.  Orders Placed This Encounter  Procedures   Basic Metabolic Panel (BMET)   Magnesium   CBC with Differential/Platelet   AMB referral to cardiac rehabilitation    No orders of the defined types were placed in this encounter.   Patient Instructions  Medication Instructions:  Your physician recommends that you continue on your current medications as directed. Please refer to the Current Medication list given to you today.  *If you need a refill on your cardiac medications before your next appointment, please call your pharmacy*   Lab Work: Your physician recommends that you return for lab work in:  TODAY: BMET, Mag, CBC If you have labs (blood work) drawn today and your tests are completely normal, you will receive your results only by: MyChart Message (if you have MyChart) OR A paper copy in the mail If you have any lab test that is abnormal or we need to change your treatment, we will call you to review the results.   Testing/Procedures: None   Follow-Up: At [facility], you and your health needs are our priority.  As part of our continuing mission to provide you with exceptional heart care, we have created designated Provider Care Teams.  These Care Teams include your primary Cardiologist (physician) and Advanced Practice Providers (APPs -  Physician Assistants and Nurse Practitioners) who all work together  to provide you with the care you need, when you need it.  We recommend signing up for the patient portal called "MyChart".  Sign up information is provided on this After Visit Summary.  MyChart is used to connect with patients for Virtual Visits (Telemedicine).  Patients are able to view lab/test results, encounter notes, upcoming appointments, etc.  Non-urgent messages can be sent to your provider as well.   To learn more about what you can do with MyChart, go to [email or web address].    Your next appointment:   10 week(s)  The format for your next appointment:   In Person  Provider:   Thomasene Ripple, DO [address], [place], [place] [address]    Other Instructions     Adopting a Healthy Lifestyle.  Know what a healthy weight is for you (roughly BMI <25) and aim to maintain this   Aim for 7+ servings of fruits and vegetables daily   65-80+ fluid ounces of water or unsweet tea for healthy kidneys   Limit to max 1 drink of alcohol per day; avoid smoking/tobacco   Limit animal fats in diet for cholesterol and heart health - choose grass fed whenever available   Avoid highly processed foods, and foods high in saturated/trans fats   Aim for low stress - take time to unwind and care for  your mental health   Aim for 150 min of moderate intensity exercise weekly for heart health, and weights twice weekly for bone health   Aim for 7-9 hours of sleep daily   When it comes to diets, agreement about the perfect plan isnt easy to find, even among the experts. Experts at the [facility] [employer] developed an idea known as the Healthy Eating Plate. Just imagine a plate divided into logical, healthy portions.   The emphasis is on diet quality:   Load up on vegetables and fruits - one-half of your plate: Aim for color and variety, and remember that potatoes dont count.   Go for whole grains - one-quarter of your plate: Whole wheat, barley, wheat berries, quinoa,  oats, brown rice, and foods made with them. If you want pasta, go with whole wheat pasta.   Protein power - one-quarter of your plate: Fish, chicken, beans, and nuts are all healthy, versatile protein sources. Limit red meat.   The diet, however, does go beyond the plate, offering a few other suggestions.   Use healthy plant oils, such as olive, canola, soy, corn, sunflower and peanut. Check the labels, and avoid partially hydrogenated oil, which have unhealthy trans fats.   If youre thirsty, drink water. Coffee and tea are good in moderation, but skip sugary drinks and limit milk and dairy products to one or two daily servings.   The type of carbohydrate in the diet is more important than the amount. Some sources of carbohydrates, such as vegetables, fruits, whole grains, and beans-are healthier than others.   Finally, stay active  Signed, Thomasene Ripple, DO  10/31/2020 3:18 PM    [facility]

## 2020-11-01 ENCOUNTER — Telehealth: Payer: Self-pay

## 2020-11-01 NOTE — Addendum Note (Signed)
Addended by: Heywood Bene on: 11/01/2020 10:13 AM   Modules accepted: Orders

## 2020-11-01 NOTE — Telephone Encounter (Signed)
Called pt to let him know we need him to come get blood work drawn. No answer, left message.

## 2020-11-02 ENCOUNTER — Other Ambulatory Visit: Payer: Self-pay | Admitting: Hematology and Oncology

## 2020-11-02 ENCOUNTER — Other Ambulatory Visit: Payer: Self-pay | Admitting: Oncology

## 2020-11-02 ENCOUNTER — Telehealth: Payer: Self-pay | Admitting: Hematology and Oncology

## 2020-11-02 ENCOUNTER — Inpatient Hospital Stay: Payer: Medicaid Other | Attending: Hematology and Oncology

## 2020-11-02 ENCOUNTER — Encounter: Payer: Self-pay | Admitting: Oncology

## 2020-11-02 ENCOUNTER — Inpatient Hospital Stay (INDEPENDENT_AMBULATORY_CARE_PROVIDER_SITE_OTHER): Payer: Medicaid Other | Admitting: Oncology

## 2020-11-02 VITALS — BP 139/69 | HR 67 | Temp 97.7°F | Resp 18 | Ht 70.0 in | Wt 198.1 lb

## 2020-11-02 DIAGNOSIS — D508 Other iron deficiency anemias: Secondary | ICD-10-CM | POA: Diagnosis not present

## 2020-11-02 DIAGNOSIS — Z86718 Personal history of other venous thrombosis and embolism: Secondary | ICD-10-CM | POA: Insufficient documentation

## 2020-11-02 DIAGNOSIS — Z7982 Long term (current) use of aspirin: Secondary | ICD-10-CM | POA: Diagnosis not present

## 2020-11-02 DIAGNOSIS — Z79899 Other long term (current) drug therapy: Secondary | ICD-10-CM | POA: Diagnosis not present

## 2020-11-02 DIAGNOSIS — Z7901 Long term (current) use of anticoagulants: Secondary | ICD-10-CM | POA: Insufficient documentation

## 2020-11-02 DIAGNOSIS — K909 Intestinal malabsorption, unspecified: Secondary | ICD-10-CM | POA: Diagnosis present

## 2020-11-02 DIAGNOSIS — Z8673 Personal history of transient ischemic attack (TIA), and cerebral infarction without residual deficits: Secondary | ICD-10-CM | POA: Insufficient documentation

## 2020-11-02 DIAGNOSIS — D500 Iron deficiency anemia secondary to blood loss (chronic): Secondary | ICD-10-CM

## 2020-11-02 DIAGNOSIS — D539 Nutritional anemia, unspecified: Secondary | ICD-10-CM

## 2020-11-02 DIAGNOSIS — E559 Vitamin D deficiency, unspecified: Secondary | ICD-10-CM | POA: Diagnosis not present

## 2020-11-02 DIAGNOSIS — Z86711 Personal history of pulmonary embolism: Secondary | ICD-10-CM | POA: Insufficient documentation

## 2020-11-02 DIAGNOSIS — E119 Type 2 diabetes mellitus without complications: Secondary | ICD-10-CM | POA: Insufficient documentation

## 2020-11-02 DIAGNOSIS — Z7984 Long term (current) use of oral hypoglycemic drugs: Secondary | ICD-10-CM | POA: Insufficient documentation

## 2020-11-02 LAB — CBC AND DIFFERENTIAL
HCT: 28 — AB (ref 41–53)
Hemoglobin: 8.8 — AB (ref 13.5–17.5)
Neutrophils Absolute: 6.5
Platelets: 462 — AB (ref 150–399)
WBC: 10

## 2020-11-02 LAB — CBC: RBC: 3.36 — AB (ref 3.87–5.11)

## 2020-11-02 LAB — BASIC METABOLIC PANEL
BUN: 21 (ref 4–21)
CO2: 25 — AB (ref 13–22)
Chloride: 102 (ref 99–108)
Creatinine: 1.3 (ref 0.6–1.3)
Glucose: 207
Potassium: 4.5 (ref 3.4–5.3)
Sodium: 138 (ref 137–147)

## 2020-11-02 LAB — COMPREHENSIVE METABOLIC PANEL
Albumin: 3.7 (ref 3.5–5.0)
Calcium: 9.4 (ref 8.7–10.7)

## 2020-11-02 LAB — IRON AND TIBC
Iron: 19 ug/dL — ABNORMAL LOW (ref 45–182)
Saturation Ratios: 6 % — ABNORMAL LOW (ref 17.9–39.5)
TIBC: 323 ug/dL (ref 250–450)
UIBC: 304 ug/dL

## 2020-11-02 LAB — HEPATIC FUNCTION PANEL
ALT: 15 (ref 10–40)
AST: 27 (ref 14–40)
Alkaline Phosphatase: 108 (ref 25–125)
Bilirubin, Total: 0.4

## 2020-11-02 LAB — FERRITIN: Ferritin: 52 ng/mL (ref 24–336)

## 2020-11-02 NOTE — Telephone Encounter (Signed)
Per 9/28 LOS, patient scheduled for Nov Appt's.  Gave patient Appt Summary Waiting for PreAuth on IV Iron to be Scheduled

## 2020-11-03 ENCOUNTER — Encounter: Payer: Self-pay | Admitting: Oncology

## 2020-11-03 NOTE — Addendum Note (Signed)
Addended by: Domenic Schwab on: 11/03/2020 12:43 PM   Modules accepted: Orders

## 2020-11-04 ENCOUNTER — Ambulatory Visit: Payer: Medicaid Other | Admitting: Cardiology

## 2020-11-06 ENCOUNTER — Encounter: Payer: Self-pay | Admitting: Oncology

## 2020-11-08 ENCOUNTER — Other Ambulatory Visit: Payer: Self-pay | Admitting: Surgery

## 2020-11-08 DIAGNOSIS — Z951 Presence of aortocoronary bypass graft: Secondary | ICD-10-CM

## 2020-11-09 ENCOUNTER — Encounter: Payer: Self-pay | Admitting: Physician Assistant

## 2020-11-09 ENCOUNTER — Ambulatory Visit (INDEPENDENT_AMBULATORY_CARE_PROVIDER_SITE_OTHER): Payer: Self-pay | Admitting: Physician Assistant

## 2020-11-09 ENCOUNTER — Other Ambulatory Visit: Payer: Self-pay | Admitting: *Deleted

## 2020-11-09 ENCOUNTER — Ambulatory Visit
Admission: RE | Admit: 2020-11-09 | Discharge: 2020-11-09 | Disposition: A | Discharge status: Home / Self Care | Payer: Medicaid Other | Source: Ambulatory Visit | Attending: Surgery | Admitting: Surgery

## 2020-11-09 ENCOUNTER — Other Ambulatory Visit: Payer: Self-pay

## 2020-11-09 VITALS — BP 105/66 | HR 70 | Resp 20 | Ht 70.0 in | Wt 192.0 lb

## 2020-11-09 DIAGNOSIS — I2510 Atherosclerotic heart disease of native coronary artery without angina pectoris: Secondary | ICD-10-CM

## 2020-11-09 DIAGNOSIS — Z951 Presence of aortocoronary bypass graft: Secondary | ICD-10-CM

## 2020-11-09 DIAGNOSIS — I351 Nonrheumatic aortic (valve) insufficiency: Secondary | ICD-10-CM

## 2020-11-09 MED FILL — Ferumoxytol Inj 510 MG/17ML (30 MG/ML) (Elemental Fe): INTRAVENOUS | Qty: 17 | Status: AC

## 2020-11-09 NOTE — Progress Notes (Signed)
Referral for cardiac rehab sent to [facility] in [place] per patient's request.

## 2020-11-09 NOTE — Progress Notes (Signed)
[address]       [name] [address]             [phone]     CARLITOS BOTTINO is a 60 y.o. male patient who was admitted and on 10/14/2020 taken to the operating room at which time he underwent CABG x4 and aortic valve replacement. He had post operative Atrial Fibrillation when he was removed from cardiopulmonary bypass. He was treated with the Amio protocol and converted to NSR. At the time of discharge he was feeling okay and ready for home.   He presents to the office today for his routine post-op visit about 4 weeks after surgery. He was admitted again on 9/20 after he had an episode of severe shortness of breath and chest pain. He called EMS and was in the hospital was found to have multiple segmental pulmonary emboli. He was in afib in the 150s. He was started on heparin and was ultimately discharged on  Amio, Eliquis, and Imdur. He is feeling much better but still requiring some pain medication in the morning and before bed.    No diagnosis found. Past Medical History:  Diagnosis Date   Age-related nuclear cataract of both eyes 11/27/2018   Anemia    Bilateral recurrent inguinal hernia without obstruction or gangrene 05/09/2016   Added automatically from request for surgery [id number]   Coronary artery disease involving native coronary artery without angina pectoris 05/2016   History of PCI to nondominant RCA. ->  Cath in April 2018 showed occluded nondominant RCA stent.  30% LM.  pLAD 40%.  Ostial RI 60%.   Cortical age-related cataract of both eyes 11/27/2018   Depression    Diabetes mellitus (HCC)    Hyperlipidemia LDL goal <70    Hypertension    Inguinal hernia without obstruction or gangrene    BILATERAL   Primary open angle glaucoma (POAG) of both eyes, severe stage 11/27/2018   Primary open angle glaucoma (POAG) of left eye, severe stage 11/27/2018   Stroke [facility])    Tobacco abuse 11/05/2019   Tobacco use 11/06/2019   No past surgical history  pertinent negatives on file. Scheduled Meds: Current Outpatient Medications on File Prior to Visit  Medication Sig Dispense Refill   amiodarone (PACERONE) 200 MG tablet Please take 2 tablets (400 mg) twice daily on 9/21 only, then take 2 tablets (400 mg) once daily thereafter. (Patient taking differently: Take 400 mg by mouth 2 (two) times daily. Please take 2 tablets (400 mg) twice daily on 9/21 only, then take 2 tablets (400 mg) once daily thereafter.) 64 tablet 1   APIXABAN (ELIQUIS) VTE STARTER PACK (10MG  AND 5MG ) Take as directed on package: start with two-5mg  tablets twice daily for 7 days. On day 8, switch to one-5mg  tablet twice daily. (Patient taking differently: Take 5 mg by mouth 2 (two) times daily. Take as directed on package: start with two-5mg  tablets twice daily for 7 days. On day 8, switch to one-5mg  tablet twice daily.) 74 tablet 0   aspirin EC 81 MG tablet Take 1 tablet (81 mg total) by mouth daily. Swallow whole. 150 tablet 2   Aspirin-Caffeine (BC FAST PAIN RELIEF PO) Take 1 tablet by mouth as needed (fever).     atorvastatin (LIPITOR) 80 MG tablet Take 1 tablet (80 mg total) by mouth daily. 30 tablet 1   empagliflozin (JARDIANCE) 10 MG TABS tablet Take 1 tablet (10 mg total) by mouth daily. 30 tablet  1   isosorbide mononitrate (IMDUR) 60 MG 24 hr tablet Take 60 mg by mouth daily.     latanoprost (XALATAN) 0.005 % ophthalmic solution Place 1 drop into the right eye at bedtime.     metFORMIN (GLUCOPHAGE) 1000 MG tablet Take 1,000 mg by mouth 2 (two) times daily with a meal.     metoprolol (LOPRESSOR) 50 MG tablet Take 1 tablet (50 mg total) by mouth 2 (two) times daily. 180 tablet 2   oxyCODONE (OXY IR/ROXICODONE) 5 MG immediate release tablet Take 1 tablet (5 mg total) by mouth every 6 (six) hours as needed for severe pain. (Patient taking differently: Take 5 mg by mouth every 6 (six) hours as needed for severe pain or moderate pain.) 28 tablet 0   pantoprazole (PROTONIX) 40 MG  tablet Take 1 tablet (40 mg total) by mouth daily. (Patient taking differently: Take 40 mg by mouth daily before breakfast.) 90 tablet 3   PATADAY 0.2 % SOLN Place 1 drop into the right eye 2 (two) times daily.     Tiotropium Bromide Monohydrate (SPIRIVA RESPIMAT) 2.5 MCG/ACT AERS Inhale 1 puff into the lungs 2 (two) times daily.     valsartan (DIOVAN) 40 MG tablet Take 1 tablet (40 mg total) by mouth daily. 90 tablet 3   No current facility-administered medications on file prior to visit.     Allergies  Allergen Reactions   Cetacaine [Butamben-Tetracaine-Benzocaine] Shortness Of Breath and Other (See Comments)    Intense laryngospasm   Active Problems:   * No active hospital problems. *  There were no vitals taken for this visit. Vitals:   11/09/20 1356  BP: 105/66  Pulse: 70  Resp: 20  SpO2: 96%    Cor: NSR, no murmur Pulm: Rhonchi bilaterally in the lower lobes Abd: No tenderness Wound: healing well Ext: no edema  Assessment & Plan  He has been doing okay since he was hospitalized again after discharge for multiple PEs. He feels okay today but is still requiring pain medication in the morning and evening. I refilled his oxycodone today and we reviewed weaning off of it over the next few weeks. He is not cleared to drive at this time. We are sending a referral for cardiac rehab in [place]. He plans to attend.   He is now on Eliquis, Amio, and Imdur and tolerating. He sees Dr. Servando Salina for his Cardiologist.   Plan: I want to bring him back in 3 weeks to make sure he is feeling better and his pain is better controlled. He is to call our office if he has any concerns before that.    Sharlene Dory 11/09/2020

## 2020-11-10 ENCOUNTER — Other Ambulatory Visit: Payer: Self-pay

## 2020-11-10 ENCOUNTER — Other Ambulatory Visit: Payer: Self-pay | Admitting: Physician Assistant

## 2020-11-10 ENCOUNTER — Inpatient Hospital Stay: Payer: Medicaid Other | Attending: Hematology and Oncology

## 2020-11-10 VITALS — BP 124/80 | HR 68 | Temp 98.1°F | Resp 18 | Ht 70.0 in | Wt 190.0 lb

## 2020-11-10 DIAGNOSIS — D508 Other iron deficiency anemias: Secondary | ICD-10-CM | POA: Diagnosis not present

## 2020-11-10 DIAGNOSIS — K909 Intestinal malabsorption, unspecified: Secondary | ICD-10-CM | POA: Insufficient documentation

## 2020-11-10 DIAGNOSIS — D500 Iron deficiency anemia secondary to blood loss (chronic): Secondary | ICD-10-CM

## 2020-11-10 MED ORDER — OXYCODONE HCL 5 MG PO TABS: 5.0000 mg | ORAL_TABLET | Freq: Four times a day (QID) | ORAL | 0 refills | Status: DC | PRN

## 2020-11-10 MED ORDER — SODIUM CHLORIDE 0.9 % IV SOLN
510.0000 mg | Freq: Once | INTRAVENOUS | Status: AC
  Administered 2020-11-10: 510 mg via INTRAVENOUS
  Filled 2020-11-10: qty 510

## 2020-11-10 MED ORDER — SODIUM CHLORIDE 0.9 % IV SOLN
Freq: Once | INTRAVENOUS | Status: AC
Start: 2020-11-10 — End: 2020-11-10

## 2020-11-10 NOTE — Patient Instructions (Signed)
Ferumoxytol Injection What is this medication? FERUMOXYTOL (FER ue MOX i tol) treats low levels of iron in your body (iron deficiency anemia). Iron is a mineral that plays an important role in making red blood cells, which carry oxygen from your lungs to the rest of your body. This medicine may be used for other purposes; ask your health care provider or pharmacist if you have questions. COMMON BRAND NAME(S): Feraheme What should I tell my care team before I take this medication? They need to know if you have any of these conditions: Anemia not caused by low iron levels High levels of iron in the blood Magnetic resonance imaging (MRI) test scheduled An unusual or allergic reaction to iron, other medications, foods, dyes, or preservatives Pregnant or trying to get pregnant Breast-feeding How should I use this medication? This medication is for injection into a vein. It is given in a hospital or clinic setting. Talk to your care team the use of this medication in children. Special care may be needed. Overdosage: If you think you have taken too much of this medicine contact a poison control center or emergency room at once. NOTE: This medicine is only for you. Do not share this medicine with others. What if I miss a dose? It is important not to miss your dose. Call your care team if you are unable to keep an appointment. What may interact with this medication? Other iron products This list may not describe all possible interactions. Give your health care provider a list of all the medicines, herbs, non-prescription drugs, or dietary supplements you use. Also tell them if you smoke, drink alcohol, or use illegal drugs. Some items may interact with your medicine. What should I watch for while using this medication? Visit your care team regularly. Tell your care team if your symptoms do not start to get better or if they get worse. You may need blood work done while you are taking this  medication. You may need to follow a special diet. Talk to your care team. Foods that contain iron include: whole grains/cereals, dried fruits, beans, or peas, leafy green vegetables, and organ meats (liver, kidney). What side effects may I notice from receiving this medication? Side effects that you should report to your care team as soon as possible: Allergic reactions-skin rash, itching, hives, swelling of the face, lips, tongue, or throat Low blood pressure-dizziness, feeling faint or lightheaded, blurry vision Shortness of breath Side effects that usually do not require medical attention (report to your care team if they continue or are bothersome): Flushing Headache Joint pain Muscle pain Nausea Pain, redness, or irritation at injection site This list may not describe all possible side effects. Call your doctor for medical advice about side effects. You may report side effects to FDA at 1-800-FDA-1088. Where should I keep my medication? This medication is given in a hospital or clinic and will not be stored at home. NOTE: This sheet is a summary. It may not cover all possible information. If you have questions about this medicine, talk to your doctor, pharmacist, or health care provider.  2022 Elsevier/Gold Standard (2020-06-10 15:35:12)  

## 2020-11-11 ENCOUNTER — Encounter: Payer: Self-pay | Admitting: Oncology

## 2020-11-11 ENCOUNTER — Other Ambulatory Visit (HOSPITAL_COMMUNITY): Payer: Self-pay

## 2020-11-11 ENCOUNTER — Telehealth (HOSPITAL_COMMUNITY): Payer: Self-pay

## 2020-11-11 NOTE — Addendum Note (Signed)
Addended by: Domenic Schwab on: 11/11/2020 09:08 AM   Modules accepted: Orders

## 2020-11-11 NOTE — Telephone Encounter (Signed)
Transitions of Care Pharmacy  ° °Call attempted for a pharmacy transitions of care follow-up. HIPAA appropriate voicemail was left with call back information provided.  ° °Call attempt #1. Will follow-up in 2-3 days.  °  °

## 2020-11-14 ENCOUNTER — Inpatient Hospital Stay: Payer: Medicaid Other

## 2020-11-14 ENCOUNTER — Other Ambulatory Visit: Payer: Self-pay

## 2020-11-14 ENCOUNTER — Telehealth (HOSPITAL_COMMUNITY): Payer: Self-pay

## 2020-11-14 VITALS — BP 117/68 | HR 76 | Temp 98.2°F | Resp 18 | Ht 70.0 in | Wt 191.0 lb

## 2020-11-14 DIAGNOSIS — D500 Iron deficiency anemia secondary to blood loss (chronic): Secondary | ICD-10-CM

## 2020-11-14 DIAGNOSIS — D508 Other iron deficiency anemias: Secondary | ICD-10-CM | POA: Diagnosis not present

## 2020-11-14 MED ORDER — SODIUM CHLORIDE 0.9 % IV SOLN
510.0000 mg | Freq: Once | INTRAVENOUS | Status: AC
  Administered 2020-11-14: 510 mg via INTRAVENOUS
  Filled 2020-11-14: qty 17

## 2020-11-14 MED ORDER — SODIUM CHLORIDE 0.9 % IV SOLN: Freq: Once | INTRAVENOUS | Status: AC

## 2020-11-14 NOTE — Patient Instructions (Signed)
Ferumoxytol Injection What is this medication? FERUMOXYTOL (FER ue MOX i tol) treats low levels of iron in your body (iron deficiency anemia). Iron is a mineral that plays an important role in making red blood cells, which carry oxygen from your lungs to the rest of your body. This medicine may be used for other purposes; ask your health care provider or pharmacist if you have questions. COMMON BRAND NAME(S): Feraheme What should I tell my care team before I take this medication? They need to know if you have any of these conditions: Anemia not caused by low iron levels High levels of iron in the blood Magnetic resonance imaging (MRI) test scheduled An unusual or allergic reaction to iron, other medications, foods, dyes, or preservatives Pregnant or trying to get pregnant Breast-feeding How should I use this medication? This medication is for injection into a vein. It is given in a hospital or clinic setting. Talk to your care team the use of this medication in children. Special care may be needed. Overdosage: If you think you have taken too much of this medicine contact a poison control center or emergency room at once. NOTE: This medicine is only for you. Do not share this medicine with others. What if I miss a dose? It is important not to miss your dose. Call your care team if you are unable to keep an appointment. What may interact with this medication? Other iron products This list may not describe all possible interactions. Give your health care provider a list of all the medicines, herbs, non-prescription drugs, or dietary supplements you use. Also tell them if you smoke, drink alcohol, or use illegal drugs. Some items may interact with your medicine. What should I watch for while using this medication? Visit your care team regularly. Tell your care team if your symptoms do not start to get better or if they get worse. You may need blood work done while you are taking this  medication. You may need to follow a special diet. Talk to your care team. Foods that contain iron include: whole grains/cereals, dried fruits, beans, or peas, leafy green vegetables, and organ meats (liver, kidney). What side effects may I notice from receiving this medication? Side effects that you should report to your care team as soon as possible: Allergic reactions-skin rash, itching, hives, swelling of the face, lips, tongue, or throat Low blood pressure-dizziness, feeling faint or lightheaded, blurry vision Shortness of breath Side effects that usually do not require medical attention (report to your care team if they continue or are bothersome): Flushing Headache Joint pain Muscle pain Nausea Pain, redness, or irritation at injection site This list may not describe all possible side effects. Call your doctor for medical advice about side effects. You may report side effects to FDA at 1-800-FDA-1088. Where should I keep my medication? This medication is given in a hospital or clinic and will not be stored at home. NOTE: This sheet is a summary. It may not cover all possible information. If you have questions about this medicine, talk to your doctor, pharmacist, or health care provider.  2022 Elsevier/Gold Standard (2020-06-10 15:35:12)  

## 2020-11-14 NOTE — Telephone Encounter (Signed)
Transitions of Care Pharmacy   Call attempted for a pharmacy transitions of care follow-up. HIPAA appropriate voicemail was left with call back information provided.   Call attempt #2. Will follow-up in 2-3 days.    

## 2020-11-15 ENCOUNTER — Other Ambulatory Visit (HOSPITAL_COMMUNITY): Payer: Self-pay

## 2020-11-15 ENCOUNTER — Telehealth (HOSPITAL_COMMUNITY): Payer: Self-pay | Admitting: Pharmacist

## 2020-11-15 ENCOUNTER — Other Ambulatory Visit: Payer: Self-pay

## 2020-11-15 MED ORDER — APIXABAN 5 MG PO TABS: 5.0000 mg | ORAL_TABLET | Freq: Two times a day (BID) | ORAL | 5 refills | Status: AC

## 2020-11-15 NOTE — Telephone Encounter (Signed)
Pharmacy Transitions of Care Follow-up Telephone Call  Date of discharge: 10/25/20  Discharge Diagnosis: PE  How have you been since you were released from the hospital?  Overall well, experiencing some fatigue and also upset stomach including nausea and loss of appetite.   Medication changes made at discharge:      START taking: Eliquis DVT/PE Starter Pack (Apixaban Starter Pack (10mg  and 5mg ))  Jardiance (empagliflozin)  CHANGE how you take: amiodarone (PACERONE) (2 tab-400mg -daily as maint but may stop this at next cardiology visit per Dr. note from 10/31/20) aspirin EC  STOP taking: ferrous fumarate-b12-vitamic C-folic acid capsule (TRINSICON / FOLTRIN)  glipiZIDE 10 MG tablet (GLUCOTROL)   Medication changes verified by the patient? Yes   Upcoming dental work scheduled for tooth extraction, advised pt to contact office prior to appointment to notify them of eliquis therapy.   GI upset possibly caused by amiodarone, advised pt NOT to discontinue this without speaking with MD. Benefits of med outweigh mild nausea/loss of appetite.  Encouraged him to speak with MD about this at next visit or contact office before then if it continues to be bothersome.  Medication Accessibility:  Home Pharmacy:  [facility], [place]  Was the patient provided with refills on discharged medications? Yes, but no refills for maintenance dose of Eliquis   Have all prescriptions been transferred from [facility] to home pharmacy?  Yes  Is the patient able to afford medications? Has Medicaid, $4   Medication Review:  APIXABAN (ELIQUIS)  Apixaban 10 mg BID initiated on 10/25/20. Will switch to apixaban 5 mg BID after 7 days (DATE 11/02/20).  - Discussed importance of taking medication around the same time everyday  - Reviewed potential DDIs with patient  - Advised patient of medications to avoid (NSAIDs, ASA)  - Educated that Tylenol (acetaminophen) will be the preferred analgesic to prevent risk of  bleeding  - Emphasized importance of monitoring for signs and symptoms of bleeding (abnormal bruising, prolonged bleeding, nose bleeds, bleeding from gums, discolored urine, black tarry stools)  - Advised patient to alert all providers of anticoagulation therapy prior to starting a new medication or having a procedure   Smoking Cessation - pt is attempting to cut back, I have provided encouragement  Follow-up Appointments:  PCP Hospital f/u appt confirmed? None currently scheduled  Specialist Hospital f/u appt confirmed?  Saw Dr. 10/27/20 on 10/31/20 @ Cardiology.  Next cardiology appt on 10/24 at 10:15 for echo.  Referred to cardiac rehab.  If their condition worsens, is the pt aware to call PCP or go to the Emergency Dept.? yes  Final Patient Assessment: Patient had an overall good understanding of medications and seems to be doing well.  We reviewed new meds, discussed GI upset, very briefly discussed smoking cessation.  I will reach out to cardiologist for refills on eliquis to be sent to pts home pharmacy.

## 2020-11-15 NOTE — Telephone Encounter (Signed)
Prescription refill request for Eliquis received.  hx: Pe, Post op afib  Last office visit: 10/31/2020 Scr: 1.3, 11/02/2020 Age: 60 yo  Weight: 86.7 kg   Spoke with chris pharm D. Will have pt continue with Eliquis 5mg  BID. Called and spoke to pt, who confirmed he was taking Eliquis 5mg  BID. Will send in to [facility] pharmacy.

## 2020-11-28 ENCOUNTER — Telehealth: Payer: Self-pay

## 2020-11-28 ENCOUNTER — Other Ambulatory Visit: Payer: Self-pay

## 2020-11-28 ENCOUNTER — Ambulatory Visit (INDEPENDENT_AMBULATORY_CARE_PROVIDER_SITE_OTHER): Payer: Medicaid Other

## 2020-11-28 DIAGNOSIS — I350 Nonrheumatic aortic (valve) stenosis: Secondary | ICD-10-CM | POA: Diagnosis not present

## 2020-11-28 LAB — ECHOCARDIOGRAM COMPLETE
AR max vel: 1.43 cm2
AV Area VTI: 1.39 cm2
AV Area mean vel: 1.32 cm2
AV Mean grad: 11.5 mmHg
AV Peak grad: 19.8 mmHg
Ao pk vel: 2.23 m/s
Area-P 1/2: 2.41 cm2
S' Lateral: 2.9 cm

## 2020-11-28 NOTE — Telephone Encounter (Signed)
Spoke with patient regarding results and recommendation.  Patient verbalizes understanding and is agreeable to plan of care. Advised patient to call back with any issues or concerns.  

## 2020-11-28 NOTE — Telephone Encounter (Signed)
-----   Message from Baldo Daub, MD sent at 11/28/2020  1:54 PM EDT ----- Good result AVR function normal

## 2020-11-29 ENCOUNTER — Encounter: Payer: Self-pay | Admitting: Cardiology

## 2020-11-29 NOTE — Telephone Encounter (Signed)
ERROR

## 2020-11-30 ENCOUNTER — Telehealth: Payer: Self-pay | Admitting: *Deleted

## 2020-11-30 NOTE — Telephone Encounter (Signed)
   [facility] Pre-operative Risk Assessment    Patient Name: Vincent Guerrero  DOB: Mar 15, 1960 MRN: [id number]  HEARTCARE STAFF:  - IMPORTANT!!!!!! Under Visit Info/Reason for Call, type in Other and utilize the format Clearance MM/DD/YY or Clearance TBD. Do not use dashes or single digits. - Please review there is not already an duplicate clearance open for this procedure. - If request is for dental extraction, please clarify the # of teeth to be extracted. - If the patient is currently at the dentist's office, call Pre-Op Callback Staff (MA/nurse) to input urgent request.  - If the patient is not currently in the dentist office, please route to the Pre-Op pool.  Request for surgical clearance:  What type of surgery is being performed? Surgical tooth extraction  When is this surgery scheduled? TBD  What type of clearance is required (medical clearance vs. Pharmacy clearance to hold med vs. Both)? medical  Are there any medications that need to be held prior to surgery and how long? none  Practice name and name of physician performing surgery? [facility]  What is the office phone number? [phone]   7.   What is the office fax number? [phone]  8.   Anesthesia type (None, local, MAC, general) ? Not listed   Fredia Beets 11/30/2020, 12:54 PM  _________________________________________________________________   (provider comments below)

## 2020-11-30 NOTE — Telephone Encounter (Signed)
   Patient Name: Vincent Guerrero  DOB: [birth year] MRN: [id number]  Primary Cardiologist: Thomasene Ripple, DO  Chart reviewed as part of pre-operative protocol coverage.   Will route to callback team to inquire number of teeth and type of anesthesia. Patient has complex cardiac history and recently underwent CAD s/p CABG and pericardial AVR 10/14/20 complicated by post-op AFib and pulmonary emboli requiring anticoagulation. He is on both ASA and Eliquis due to recent issues and I do not anticipate that either will be able to be stopped. Will likely need MD clearance once more info known about procedure details. SBE ppx WILL be needed for any future dental procedures due to valve replacement.  Laurann Montana, PA-C 11/30/2020, 1:12 PM

## 2020-11-30 NOTE — Telephone Encounter (Signed)
Left message for dental office call back and confirm how many teeth are being extracted as well as type of anesthesia being used, if any.

## 2020-12-01 NOTE — Telephone Encounter (Signed)
I was able to s/w the dental office today. I confirmed that the procedure will be for two teeth to be surgically extracted. I did inform the dental office that the pt is going to need SBE due to valve replacement. I asked did our office need to send in SBE or is their office sending in SBE. Per DDS office they have filled Rx for ABX for the pt for the upcoming procedure. Dental office is aware that pt will always need SBE for dental work due to heart valve replacement.  I will update the pre op provider. Once cleared we will fax over clearance notes.

## 2020-12-01 NOTE — Telephone Encounter (Signed)
Called back [facility] to verify anesthesia. LOCAL.

## 2020-12-01 NOTE — Telephone Encounter (Signed)
   Patient Name: Vincent Guerrero  DOB: [birth year] MRN: [id number]  Primary Cardiologist: Thomasene Ripple, DO  Chart reviewed as part of pre-operative protocol coverage.   Simple dental extractions (1-2 teeth) are considered low risk procedures per guidelines and generally do not require any specific cardiac clearance. It is also generally accepted that for simple extractions (1-2 teeth) and dental cleanings, there is no need to interrupt blood thinner therapy.  SBE prophylaxis is required for the patient from a cardiac standpoint. It has been confirmed that the dental office submitted a prescription for antibiotics for his upcoming procedure and is aware he will require SBE ppx for all future dental work.  I will route this recommendation to the requesting party via Epic fax function and remove from pre-op pool.  Please call with questions.  Beatriz Stallion, PA-C 12/01/2020, 4:34 PM

## 2020-12-06 NOTE — Progress Notes (Deleted)
[address]       [place] [address]             [phone]       HPI: Mr. Vincent Guerrero is a 60 year old male who is s/p CABG x 4 and AVR (bioprosthetic, size 25 mm) on 10/14/2020 by Dr. Laneta Guerrero. Patient had a re admission afterward for pulmonary emboli.  He has been on Apixaban. Of note, he had post op atrial fibrillation and finished Amiodarone. Patient returns for follow up after having seen Vincent Favre PA-C on 11/09/2020. At that visit, he was still having some post operative pain so she gave him a refill on Oxycodone.  Patient reports **.   Current Outpatient Medications  Medication Sig Dispense Refill   amiodarone (PACERONE) 200 MG tablet Please take 2 tablets (400 mg) twice daily on 9/21 only, then take 2 tablets (400 mg) once daily thereafter. (Patient taking differently: Take 400 mg by mouth 2 (two) times daily. Please take 2 tablets (400 mg) twice daily on 9/21 only, then take 2 tablets (400 mg) once daily thereafter.) 64 tablet 1   apixaban (ELIQUIS) 5 MG TABS tablet Take 1 tablet (5 mg total) by mouth 2 (two) times daily. 60 tablet 5   aspirin EC 81 MG tablet Take 1 tablet (81 mg total) by mouth daily. Swallow whole. 150 tablet 2   Aspirin-Caffeine (BC FAST PAIN RELIEF PO) Take 1 tablet by mouth as needed (fever).     atorvastatin (LIPITOR) 80 MG tablet Take 1 tablet (80 mg total) by mouth daily. 30 tablet 1   empagliflozin (JARDIANCE) 10 MG TABS tablet Take 1 tablet (10 mg total) by mouth daily. 30 tablet 1   isosorbide mononitrate (IMDUR) 60 MG 24 hr tablet Take 60 mg by mouth daily.     latanoprost (XALATAN) 0.005 % ophthalmic solution Place 1 drop into the right eye at bedtime.     metFORMIN (GLUCOPHAGE) 1000 MG tablet Take 1,000 mg by mouth 2 (two) times daily with a meal.     metoprolol (LOPRESSOR) 50 MG tablet Take 1 tablet (50 mg total) by mouth 2 (two) times daily. 180 tablet 2   oxyCODONE (OXY IR/ROXICODONE) 5 MG immediate release tablet Take 1  tablet (5 mg total) by mouth every 6 (six) hours as needed for severe pain. 28 tablet 0   pantoprazole (PROTONIX) 40 MG tablet Take 1 tablet (40 mg total) by mouth daily. (Patient taking differently: Take 40 mg by mouth daily before breakfast.) 90 tablet 3   PATADAY 0.2 % SOLN Place 1 drop into the right eye 2 (two) times daily.     Tiotropium Bromide Monohydrate (SPIRIVA RESPIMAT) 2.5 MCG/ACT AERS Inhale 1 puff into the lungs 2 (two) times daily.     valsartan (DIOVAN) 40 MG tablet Take 1 tablet (40 mg total) by mouth daily. (Patient taking differently: Take 40 mg by mouth 2 (two) times daily.) 90 tablet 3  Vital Signs:   Physical Exam: CV- Pulmonary- Abdomen- Extremities- Wounds-   Impression and Plan: Patient is scheduled to have 2 teeth extracted and cardiology discussed the importance of SBE prophylaxis (antibiotic prior to procedure because of AVR). Next Monday, he may Gradually begin to lift more than 10 pounds over the next few weeks as he is already  7 weeks post op. He has a follow up cardiology appointment with Dr. Servando Guerrero 12/01/20220. As discussed with the patient, he will return to see Dr. Laneta Guerrero  Vincent Balls, PA-C [facility] [phone]

## 2020-12-07 ENCOUNTER — Encounter: Payer: Self-pay | Admitting: Surgery

## 2020-12-07 ENCOUNTER — Encounter: Payer: Self-pay | Admitting: Physician Assistant

## 2020-12-19 ENCOUNTER — Inpatient Hospital Stay: Payer: Medicaid Other | Attending: Hematology and Oncology

## 2020-12-19 ENCOUNTER — Other Ambulatory Visit: Payer: Self-pay

## 2020-12-19 ENCOUNTER — Inpatient Hospital Stay (INDEPENDENT_AMBULATORY_CARE_PROVIDER_SITE_OTHER): Payer: Medicaid Other | Admitting: Hematology and Oncology

## 2020-12-19 ENCOUNTER — Encounter: Payer: Self-pay | Admitting: Hematology and Oncology

## 2020-12-19 ENCOUNTER — Ambulatory Visit (INDEPENDENT_AMBULATORY_CARE_PROVIDER_SITE_OTHER): Payer: Self-pay | Admitting: Physician Assistant

## 2020-12-19 VITALS — BP 109/69 | HR 76 | Resp 20 | Ht 70.0 in | Wt 182.0 lb

## 2020-12-19 DIAGNOSIS — Z7901 Long term (current) use of anticoagulants: Secondary | ICD-10-CM | POA: Insufficient documentation

## 2020-12-19 DIAGNOSIS — Z951 Presence of aortocoronary bypass graft: Secondary | ICD-10-CM

## 2020-12-19 DIAGNOSIS — Z7982 Long term (current) use of aspirin: Secondary | ICD-10-CM | POA: Diagnosis not present

## 2020-12-19 DIAGNOSIS — D539 Nutritional anemia, unspecified: Secondary | ICD-10-CM

## 2020-12-19 DIAGNOSIS — D508 Other iron deficiency anemias: Secondary | ICD-10-CM | POA: Diagnosis present

## 2020-12-19 DIAGNOSIS — F17210 Nicotine dependence, cigarettes, uncomplicated: Secondary | ICD-10-CM | POA: Insufficient documentation

## 2020-12-19 DIAGNOSIS — Z7984 Long term (current) use of oral hypoglycemic drugs: Secondary | ICD-10-CM | POA: Insufficient documentation

## 2020-12-19 DIAGNOSIS — D500 Iron deficiency anemia secondary to blood loss (chronic): Secondary | ICD-10-CM | POA: Diagnosis not present

## 2020-12-19 DIAGNOSIS — K909 Intestinal malabsorption, unspecified: Secondary | ICD-10-CM | POA: Insufficient documentation

## 2020-12-19 DIAGNOSIS — Z79899 Other long term (current) drug therapy: Secondary | ICD-10-CM | POA: Insufficient documentation

## 2020-12-19 DIAGNOSIS — Z952 Presence of prosthetic heart valve: Secondary | ICD-10-CM

## 2020-12-19 LAB — HEPATIC FUNCTION PANEL
ALT: 60 — AB (ref 10–40)
AST: 47 — AB (ref 14–40)
Alkaline Phosphatase: 120 (ref 25–125)
Bilirubin, Total: 0.5

## 2020-12-19 LAB — IRON AND TIBC
Iron: 60 ug/dL (ref 45–182)
Saturation Ratios: 17 % — ABNORMAL LOW (ref 17.9–39.5)
TIBC: 346 ug/dL (ref 250–450)
UIBC: 286 ug/dL

## 2020-12-19 LAB — BASIC METABOLIC PANEL
BUN: 41 — AB (ref 4–21)
CO2: 26 — AB (ref 13–22)
Chloride: 101 (ref 99–108)
Creatinine: 1.2 (ref 0.6–1.3)
Glucose: 176
Potassium: 3.7 (ref 3.4–5.3)
Sodium: 138 (ref 137–147)

## 2020-12-19 LAB — CBC AND DIFFERENTIAL
HCT: 42 (ref 41–53)
Hemoglobin: 13.5 (ref 13.5–17.5)
Neutrophils Absolute: 8.82
Platelets: 219 (ref 150–399)
WBC: 14

## 2020-12-19 LAB — FERRITIN: Ferritin: 195 ng/mL (ref 24–336)

## 2020-12-19 LAB — COMPREHENSIVE METABOLIC PANEL
Albumin: 4.5 (ref 3.5–5.0)
Calcium: 9.3 (ref 8.7–10.7)

## 2020-12-19 LAB — CBC: RBC: 5.14 — AB (ref 3.87–5.11)

## 2020-12-19 MED ORDER — ATORVASTATIN CALCIUM 80 MG PO TABS
80.0000 mg | ORAL_TABLET | Freq: Every day | ORAL | 1 refills | Status: AC
Start: 2020-12-19 — End: ?

## 2020-12-19 MED ORDER — TRAMADOL HCL 50 MG PO TABS: 50.0000 mg | ORAL_TABLET | Freq: Three times a day (TID) | ORAL | 0 refills | Status: DC | PRN

## 2020-12-19 MED ORDER — AMIODARONE HCL 200 MG PO TABS: ORAL_TABLET | ORAL | 1 refills | Status: DC

## 2020-12-19 MED ORDER — EMPAGLIFLOZIN 10 MG PO TABS: 10.0000 mg | ORAL_TABLET | Freq: Every day | ORAL | 1 refills | Status: AC

## 2020-12-19 NOTE — Progress Notes (Signed)
HPI:  Patient returns to office for 4 week follow up.  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  He required readmission for multiple PEs and was discharged on Eliquis.   He was last evaluated by Jari Favre PA-C on 11/09/2020.  At that visit the patient continued to complain of pain for which she provided an additional RX for oxycodone. He was referred to [facility] in [place].   The presents for follow up today.  He continues to complain of pain along his surgical incision.  He states that it doesn't occur all the time, but it happens on the right and left side.  He also notes sometimes it comes up from the chest tube sites.  He states it has been difficult to get his pain medication from [place], so much so that he purchased oxycodone off the street and said it was very easy.  He is requesting a pain medication refill again today.  Current Outpatient Medications  Medication Sig Dispense Refill   apixaban (ELIQUIS) 5 MG TABS tablet Take 1 tablet (5 mg total) by mouth 2 (two) times daily. 60 tablet 5   aspirin EC 81 MG tablet Take 1 tablet (81 mg total) by mouth daily. Swallow whole. 150 tablet 2   hydrochlorothiazide (HYDRODIURIL) 25 MG tablet Take 25 mg by mouth daily.     isosorbide mononitrate (IMDUR) 60 MG 24 hr tablet Take 60 mg by mouth daily.     latanoprost (XALATAN) 0.005 % ophthalmic solution Place 1 drop into the right eye at bedtime.     metFORMIN (GLUCOPHAGE) 1000 MG tablet Take 1,000 mg by mouth 2 (two) times daily with a meal.     metoprolol (LOPRESSOR) 50 MG tablet Take 1 tablet (50 mg total) by mouth 2 (two) times daily. 180 tablet 2   pantoprazole (PROTONIX) 40 MG tablet Take 1 tablet (40 mg total) by mouth daily. (Patient taking differently: Take 40 mg by mouth daily before breakfast.) 90 tablet 3   PATADAY 0.2 % SOLN Place 1 drop into the right eye 2 (two) times daily.     terazosin (HYTRIN) 1 MG capsule Take 1 mg by mouth daily.     Tiotropium Bromide  Monohydrate (SPIRIVA RESPIMAT) 2.5 MCG/ACT AERS Inhale 1 puff into the lungs 2 (two) times daily.     traMADol (ULTRAM) 50 MG tablet Take 1 tablet (50 mg total) by mouth every 8 (eight) hours as needed. 40 tablet 0   amiodarone (PACERONE) 200 MG tablet Take 1 tablet daily until prescription is gone, then you can discontinue medication 64 tablet 1   atorvastatin (LIPITOR) 80 MG tablet Take 1 tablet (80 mg total) by mouth daily. 30 tablet 1   empagliflozin (JARDIANCE) 10 MG TABS tablet Take 1 tablet (10 mg total) by mouth daily. 30 tablet 1   No current facility-administered medications for this visit.    Physical Exam:  BP 109/69 (BP Location: Right Arm, Patient Position: Sitting, Cuff Size: Normal)   Pulse 76   Resp 20   Ht 5\' 10"  (1.778 m)   Wt 182 lb (82.6 kg)   SpO2 98% Comment: RA  BMI 26.11 kg/m   Gen: no apparent distress Heart: RRR Lungs: CTA bilaterally Ext; no edema Incisions: well healed  A/P:  S/P  CABG, AVR- patient continues to have pain which is not unexpected post CABG/AVR. 2. Post operative pain- patient has purchased Oxycodone off the street as he states it has been easier than getting  from the pharmacy.  I voiced my concern that overall this is an unsafe habit and he could get medication that is laced with something and would have catastrophic results.  I told him that ideally we need to wean him off pain medication if able.  I am unwilling to provide any refills on oxycodone.  The patient was instructed to use Tramadol 50 mg every 8 hours as needed for pain.  He was instructed he may also take Tylenol with this.  If this medication does not relieve his pain and he continues to have chronic pain issues, he will need referred to a pain management clinic 3. H/O A. Fib post operative- decrease Amiodarone to 200 daily and stop once RX is complete 4. RTC prn, follow up with Cardiology as needed    Lowella Dandy, PA-C [facility] [phone]

## 2020-12-19 NOTE — Progress Notes (Signed)
[facility]  [address] [place],  [place]  [address] [phone]  Clinic Day:  12/19/2020  Referring physician: Derwood Kaplan, MD  ASSESSMENT & PLAN:   Assessment & Plan: Iron deficiency anemia due to chronic blood loss History of recurrent iron deficiency anemia status post IV iron replacement with Feraheme in June and October. His hemoglobin has improved significantly. Iron studies are pending from today. We will plan to see him back in 3 months with a CBC, comprehensive metabolic panel and iron studies for repeat clinical assessment.    The patient understands the plans discussed today and is in agreement with them.  He knows to contact our office if he develops concerns prior to his next appointment.     Marvia Pickles, PA-C  [facility] [address] [place] [address] Dept: [phone] Dept Fax: [phone]   Orders Placed This Encounter  Procedures   Ferritin    Standing Status:   Future    Standing Expiration Date:   12/19/2021   CBC with Differential ([place] Only)    Standing Status:   Future    Standing Expiration Date:   12/19/2021   CMP ([place] only)    Standing Status:   Future    Standing Expiration Date:   12/19/2021   Ferritin    Standing Status:   Future    Standing Expiration Date:   12/19/2021   Iron and TIBC    Standing Status:   Future    Standing Expiration Date:   12/19/2021       CHIEF COMPLAINT:  CC: Recurrent iron deficiency anemia  Current Treatment:  IV iron replacement as needed   HISTORY OF PRESENT ILLNESS:  RITA DEWINTER is a 60 y.o. male with a history of recurrent severe iron deficiency anemia despite oral iron supplementation. He was seen by Dr. Alvy Bimler in June and his hemoglobin was 9.1 at that time. Iron studies were consistent with iron deficiency.  B12 was normal.  TSH was normal.   Vitamin-D was mildly low.  Review of his CBCs dating back to April 2018, revealed his hemoglobin ranged from 7.3-12.5, always with low MCV. Last year, he presented with severe anemia requiring blood transfusion. He underwent EGD and colonoscopy in October 2021. EGD revealed non-bleeding gastric ulcers.  He continued taking aspirin-containing products and using NSAIDs, so was instructed to stop these. The patient was symptomatic from anemia and the iron level was critically low, so he was given IV iron in the form of Feraheme with a goal to correct his anemia and iron deficiency and avoid transfusion support. When he was seen in July, his hemoglobin was up to 11.9. Folate, LDH and haptoglobin were all normal. Serum protein electrophoresis was negative for monoclonal spike. He was admitted on September 9th for severe aortic stenosis and had aortic valve replacement and coronary artery bypass grafting. Hemoglobin prior to surgery was 9.2, and down to 8.1 at the lowest following the procedure. He was discharged on September 15th on amiodarone due to atrial fibrillation and was advised to take aspirin 325 mg daily.   Lowis was seen again on September 28th and had been admitted to [facility] on September 18th due to multiple pulmonary emboli. Imaging confirmed three left lower lobe pulmonary emboli, 1 segmental and 2 subsegmental. Hemoglobin at that time was up to 9.5. He was placed on Eliquis 10 mg  BID to be switched to 5 mg daily after loading dose.  He was advised to decrease aspirin to 81 mg. He will need to stay on anticoagulation for at least 6 months, but if he remains in atrial fibrillation, he will require this longer. Hemoglobin at the time of discharge was down to 8.0 with an MCV OF 82. He was not taking oral iron supplement, as he does not tolerate them. He denied any evidence of overt bleeding. He continued aspirin 81 mg daily occasionally taking an extra dose. He was advised that he avoid  extra doses in view of the risk for GI bleed.  INTERVAL HISTORY:  Ryzen is here today for repeat clinical assessment. He denies any overt form of blood loss.  He denies progressive fatigue concerning for worsening anemia.  He reports persistent anterior chest pain, attributed to his CABG incision.  He denies dyspnea.  He no longer has oxycodone to take as needed. He has not contacted his surgeon for a refill.  I encouraged him to do so. He continues apixaban 5 mg and aspirin 81 mg daily.  He continues to report a cough productive of white sputum , which is unchanged. He denies fevers or chills. He denies other signs of infection such as diarrhea or urinary symptoms. He denies pain. His appetite is decreased. His weight has decreased 9 pounds over last month , but he has lost fluid. He has started walking occasionally, but does become fatigued after this. He has decreased smoking with plans to quit.  He currently smokes about half pack per day, which is decreased from 1-1/2 pack per day.  REVIEW OF SYSTEMS:  Review of Systems  Constitutional:  Positive for appetite change and unexpected weight change. Negative for chills, fatigue and fever.  HENT:   Negative for lump/mass, mouth sores and sore throat.   Respiratory:  Positive for cough. Negative for shortness of breath.   Cardiovascular:  Positive for chest pain (at surgical incision site). Negative for leg swelling.  Gastrointestinal:  Negative for abdominal pain, blood in stool, constipation, diarrhea, nausea and vomiting.  Genitourinary:  Negative for difficulty urinating, dysuria, frequency and hematuria.   Musculoskeletal:  Negative for arthralgias, back pain and myalgias.  Skin:  Negative for itching, rash and wound.  Neurological:  Positive for light-headedness (occasionally with standing). Negative for dizziness, extremity weakness, headaches and numbness.  Hematological:  Negative for adenopathy.  Psychiatric/Behavioral:  Negative for  depression and sleep disturbance. The patient is not nervous/anxious.     VITALS:  Blood pressure 135/83, pulse 80, temperature 98.4 F (36.9 C), temperature source Oral, resp. rate 18, height 5\' 10"  (1.778 m), weight 182 lb 4.8 oz (82.7 kg), SpO2 98 %.  Wt Readings from Last 3 Encounters:  12/19/20 182 lb 4.8 oz (82.7 kg)  11/14/20 191 lb 0.6 oz (86.7 kg)  11/10/20 190 lb (86.2 kg)    Body mass index is 26.16 kg/m.  Performance status (ECOG): 1 - Symptomatic but completely ambulatory  PHYSICAL EXAM:  Physical Exam Vitals and nursing note reviewed.  Constitutional:      General: He is not in acute distress.    Appearance: Normal appearance. He is normal weight.  HENT:     Head: Normocephalic and atraumatic.     Mouth/Throat:     Mouth: Mucous membranes are moist.     Pharynx: Oropharynx is clear. No oropharyngeal exudate or posterior oropharyngeal erythema.  Eyes:     General: No scleral icterus.  Extraocular Movements: Extraocular movements intact.     Conjunctiva/sclera: Conjunctivae normal.     Pupils: Pupils are equal, round, and reactive to light.  Cardiovascular:     Rate and Rhythm: Normal rate and regular rhythm.     Heart sounds: Normal heart sounds. No murmur heard.   No friction rub. No gallop.  Pulmonary:     Effort: Pulmonary effort is normal.     Breath sounds: Normal breath sounds. No wheezing, rhonchi or rales.  Abdominal:     General: Bowel sounds are normal. There is no distension.     Palpations: Abdomen is soft. There is no mass.     Tenderness: There is no abdominal tenderness.  Musculoskeletal:        General: Normal range of motion.     Cervical back: Normal range of motion and neck supple. No tenderness.     Right lower leg: No edema.     Left lower leg: No edema.  Lymphadenopathy:     Cervical: No cervical adenopathy.  Skin:    General: Skin is warm and dry.     Coloration: Skin is not jaundiced.     Findings: No rash.  Neurological:      Mental Status: He is alert and oriented to person, place, and time.     Cranial Nerves: No cranial nerve deficit.  Psychiatric:        Mood and Affect: Mood normal.        Behavior: Behavior normal.        Thought Content: Thought content normal.    LABS:   CBC Latest Ref Rng & Units 12/19/2020 11/02/2020 10/25/2020  WBC - 14.0 10.0 19.6(H)  Hemoglobin 13.5 - 17.5 13.5 8.8(A) 8.0(L)  Hematocrit 41 - 53 42 28(A) 25.3(L)  Platelets 150 - 399 219 462(A) 564(H)   CMP Latest Ref Rng & Units 12/19/2020 11/02/2020 10/25/2020  Glucose 70 - 99 mg/dL - - 409(W109(H)  BUN 4 - 21 41(A) 21 19  Creatinine 0.6 - 1.3 1.2 1.3 1.06  Sodium 137 - 147 138 138 137  Potassium 3.4 - 5.3 3.7 4.5 3.7  Chloride 99 - 108 101 102 101  CO2 13 - 22 26(A) 25(A) 26  Calcium 8.7 - 10.7 9.3 9.4 8.5(L)  Total Protein 6.5 - 8.1 g/dL - - -  Total Bilirubin 0.3 - 1.2 mg/dL - - -  Alkaline Phos 25 - 125 120 108 -  AST 14 - 40 47(A) 27 -  ALT 10 - 40 60(A) 15 -     No results found for: CEA1 / No results found for: CEA1 No results found for: PSA1 No results found for: CAN199 No results found for: [id number]  Lab Results  Component Value Date   TOTALPROTELP 6.3 09/01/2020   ALBUMINELP 3.7 09/01/2020   A1GS 0.2 09/01/2020   A2GS 0.7 09/01/2020   BETS 0.9 09/01/2020   GAMS 0.7 09/01/2020   MSPIKE Not Observed 09/01/2020   Lab Results  Component Value Date   TIBC 323 11/02/2020   TIBC 346 09/01/2020   TIBC 323 08/29/2020   FERRITIN 52 11/02/2020   FERRITIN 40 09/01/2020   FERRITIN 46 08/29/2020   IRONPCTSAT 6 (L) 11/02/2020   IRONPCTSAT 14 (L) 09/01/2020   IRONPCTSAT 14 (L) 08/29/2020   Lab Results  Component Value Date   LDH 137 09/01/2020    STUDIES:  ECHOCARDIOGRAM COMPLETE  Result Date: 11/28/2020    ECHOCARDIOGRAM REPORT   Patient Name:   Cristal DeerCHRISTOPHER  Nehemiah Settle Date of Exam: 11/28/2020 Medical Rec #:  [id number]           Height:       70.0 in Accession #:    [id number]          Weight:       191.0 lb  Date of Birth:  [birth year]          BSA:          2.047 m Patient Age:    63 years            BP:           117/68 mmHg Patient Gender: M                   HR:           62 bpm. Exam Location:  [facility] Procedure: 2D Echo, Cardiac Doppler, Color Doppler and Strain Analysis Indications:    Aortic valve stenosis, etiology of cardiac valve disease                 unspecified [I35.0 (ICD-10-CM)]  History:        Patient has prior history of Echocardiogram examinations, most                 recent 10/24/2020. Stroke, S/P AVR (aortic valve replacement),                 Arrythmias:Atrial Fibrillation, Signs/Symptoms:Chest Pain; Risk                 Factors:Diabetes, Current Smoker and Dyslipidemia.                 Aortic Valve: 25MM INSPIRIS RESILIA AORTIC VALVE valve is                 present in the aortic position. Procedure Date: 10/14/2020.  Sonographer:    Luane School RDCS Referring Phys: [id number] [place]  1. Left ventricular ejection fraction, by estimation, is 60 to 65%. The left ventricle has normal function. The left ventricle has no regional wall motion abnormalities. There is severe concentric left ventricular hypertrophy. Left ventricular diastolic  parameters are consistent with Grade I diastolic dysfunction (impaired relaxation). The average left ventricular global longitudinal strain is -9.4 %. The global longitudinal strain is abnormal.  2. Right ventricular systolic function is normal. The right ventricular size is normal. There is normal pulmonary artery systolic pressure.  3. The mitral valve is normal in structure. No evidence of mitral valve regurgitation. No evidence of mitral stenosis.  4. Aortic valve regurgitation is not visualized. There is a 25MM INSPIRIS RESILIA AORTIC VALVE valve present in the aortic position. Procedure Date: 10/14/2020. Echo findings are consistent with normal structure and function of the aortic valve prosthesis.  5. The inferior vena cava is normal in size  with greater than 50% respiratory variability, suggesting right atrial pressure of 3 mmHg. FINDINGS  Left Ventricle: Left ventricular ejection fraction, by estimation, is 60 to 65%. The left ventricle has normal function. The left ventricle has no regional wall motion abnormalities. The average left ventricular global longitudinal strain is -9.4 %. The  global longitudinal strain is abnormal. The left ventricular internal cavity size was normal in size. There is severe concentric left ventricular hypertrophy. Left ventricular diastolic parameters are consistent with Grade I diastolic dysfunction (impaired relaxation). Indeterminate filling pressures. Right Ventricle: The right ventricular size is normal. No increase in right ventricular wall thickness.  Right ventricular systolic function is normal. There is normal pulmonary artery systolic pressure. The tricuspid regurgitant velocity is 1.87 m/s, and  with an assumed right atrial pressure of 3 mmHg, the estimated right ventricular systolic pressure is 17.0 mmHg. Left Atrium: Left atrial size was normal in size. Right Atrium: Right atrial size was normal in size. Pericardium: There is no evidence of pericardial effusion. Mitral Valve: The mitral valve is normal in structure. No evidence of mitral valve regurgitation. No evidence of mitral valve stenosis. Tricuspid Valve: The tricuspid valve is normal in structure. Tricuspid valve regurgitation is not demonstrated. No evidence of tricuspid stenosis. Aortic Valve: Aortic valve regurgitation is not visualized. Aortic valve mean gradient measures 11.5 mmHg. Aortic valve peak gradient measures 19.8 mmHg. Aortic valve area, by VTI measures 1.39 cm. There is a INSPIRIS RESILIA AORTIC VALVE valve present in the aortic position. Procedure Date: 10/14/2020. Echo findings are consistent with normal structure and function of the aortic valve prosthesis. Pulmonic Valve: The pulmonic valve was normal in structure. Pulmonic  valve regurgitation is trivial. No evidence of pulmonic stenosis. Aorta: The aortic arch was not well visualized and the aortic root and ascending aorta are structurally normal, with no evidence of dilitation. Venous: The pulmonary veins were not well visualized. The inferior vena cava is normal in size with greater than 50% respiratory variability, suggesting right atrial pressure of 3 mmHg. IAS/Shunts: No atrial level shunt detected by color flow Doppler.  LEFT VENTRICLE PLAX 2D LVIDd:         4.40 cm   Diastology LVIDs:         2.90 cm   LV e' medial:    4.13 cm/s LV PW:         1.60 cm   LV E/e' medial:  16.5 LV IVS:        1.60 cm   LV e' lateral:   5.87 cm/s LVOT diam:     2.00 cm   LV E/e' lateral: 11.6 LV SV:         62 LV SV Index:   30        2D Longitudinal Strain LVOT Area:     3.14 cm  2D Strain GLS Avg:     -9.4 %  RIGHT VENTRICLE            IVC RV S prime:     8.27 cm/s  IVC diam: 1.10 cm TAPSE (M-mode): 0.9 cm LEFT ATRIUM             Index        RIGHT ATRIUM           Index LA diam:        4.00 cm 1.95 cm/m   RA Area:     13.10 cm LA Vol (A2C):   62.1 ml 30.34 ml/m  RA Volume:   24.80 ml  12.11 ml/m LA Vol (A4C):   39.9 ml 19.49 ml/m LA Biplane Vol: 53.1 ml 25.94 ml/m  AORTIC VALVE AV Area (Vmax):    1.43 cm AV Area (Vmean):   1.32 cm AV Area (VTI):     1.39 cm AV Vmax:           222.50 cm/s AV Vmean:          161.500 cm/s AV VTI:            0.445 m AV Peak Grad:      19.8 mmHg AV Mean Grad:  11.5 mmHg LVOT Vmax:         101.00 cm/s LVOT Vmean:        67.900 cm/s LVOT VTI:          0.197 m LVOT/AV VTI ratio: 0.44  AORTA Ao Root diam: 3.30 cm Ao Asc diam:  3.20 cm Ao Desc diam: 2.50 cm MITRAL VALVE                TRICUSPID VALVE MV Area (PHT): 2.41 cm     TR Peak grad:   14.0 mmHg MV Decel Time: 315 msec     TR Vmax:        187.00 cm/s MV E velocity: 68.10 cm/s MV A velocity: 103.00 cm/s  SHUNTS MV E/A ratio:  0.66         Systemic VTI:  0.20 m                             Systemic  Diam: 2.00 cm Shirlee More MD Electronically signed by Shirlee More MD Signature Date/Time: 11/28/2020/12:04:38 PM    Final       HISTORY:   Past Medical History:  Diagnosis Date   Age-related nuclear cataract of both eyes 11/27/2018   Anemia    Bilateral recurrent inguinal hernia without obstruction or gangrene 05/09/2016   Added automatically from request for surgery [id number]   Coronary artery disease involving native coronary artery without angina pectoris 05/2016   History of PCI to nondominant RCA. ->  Cath in April 2018 showed occluded nondominant RCA stent.  30% LM.  pLAD 40%.  Ostial RI 60%.   Cortical age-related cataract of both eyes 11/27/2018   Depression    Diabetes mellitus ([place])    Hyperlipidemia LDL goal <70    Hypertension    Inguinal hernia without obstruction or gangrene    BILATERAL   Primary open angle glaucoma (POAG) of both eyes, severe stage 11/27/2018   Primary open angle glaucoma (POAG) of left eye, severe stage 11/27/2018   Stroke ([place])    Tobacco abuse 11/05/2019   Tobacco use 11/06/2019    Past Surgical History:  Procedure Laterality Date   AORTIC VALVE REPLACEMENT N/A 10/14/2020   Procedure: AORTIC VALVE REPLACEMENT (AVR) USING 25MM INSPIRIS RESILIA AORTIC VALVE;  Surgeon: Gaye Pollack, MD;  Location: [facility];  Service: Open Heart Surgery;  Laterality: N/A;   BIOPSY  11/27/2019   Procedure: BIOPSY;  Surgeon: Doran Stabler, MD;  Location: [facility];  Service: Gastroenterology;;   COLONOSCOPY WITH PROPOFOL N/A 11/27/2019   Procedure: COLONOSCOPY WITH PROPOFOL;  Surgeon: Doran Stabler, MD;  Location: [facility];  Service: Gastroenterology;  Laterality: N/A;   CORONARY ARTERY BYPASS GRAFT N/A 10/14/2020   Procedure: CORONARY ARTERY BYPASS GRAFTING (CABG) TIMES FOUR, USING LEFT INTERNAL MAMMARY ARTERY AND RIGHT GREATER SAPHENOUS VEIN HARVESTED ENDOSCOPICALLY;  Surgeon: Gaye Pollack, MD;  Location: [facility];  Service: Open Heart Surgery;   Laterality: N/A;   ESOPHAGOGASTRODUODENOSCOPY (EGD) WITH PROPOFOL N/A 11/27/2019   Procedure: ESOPHAGOGASTRODUODENOSCOPY (EGD) WITH PROPOFOL;  Surgeon: Doran Stabler, MD;  Location: [facility];  Service: Gastroenterology;  Laterality: N/A;   HEMOSTASIS CLIP PLACEMENT  11/27/2019   Procedure: HEMOSTASIS CLIP PLACEMENT;  Surgeon: Doran Stabler, MD;  Location: [facility];  Service: Gastroenterology;;   [place] ULTRASOUND/IVUS N/A 09/12/2020   Procedure: Intravascular Ultrasound/IVUS;  Surgeon: Leonie Man, MD;  Location: [facility];  Service: Cardiovascular;  Laterality: N/A;   LEFT HEART CATH AND CORONARY ANGIOGRAPHY N/A 06/04/2016   Procedure: Left Heart Cath and Coronary Angiography;  Surgeon: Belva Crome, MD;  Location: [facility];  Service: Cardiovascular;  Laterality: N/A;   RIGHT/LEFT HEART CATH AND CORONARY ANGIOGRAPHY N/A 09/12/2020   Procedure: RIGHT/LEFT HEART CATH AND CORONARY ANGIOGRAPHY;  Surgeon: Leonie Man, MD;  Location: [facility];  Service: Cardiovascular;  Laterality: N/A;   TEE WITHOUT CARDIOVERSION N/A 08/30/2020   Procedure: TRANSESOPHAGEAL ECHOCARDIOGRAM (TEE);  Surgeon: Acie Fredrickson Wonda Cheng, MD;  Location: [facility];  Service: Cardiovascular;  Laterality: N/A;   TEE WITHOUT CARDIOVERSION N/A 10/14/2020   Procedure: TRANSESOPHAGEAL ECHOCARDIOGRAM (TEE);  Surgeon: Gaye Pollack, MD;  Location: [facility];  Service: Open Heart Surgery;  Laterality: N/A;    Family History  Problem Relation Age of Onset   Heart failure Mother    Hypertension Mother    CAD Father    Diabetes Father    Prostate cancer Brother     Social History:  reports that he has been smoking cigarettes. He has a 40.00 pack-year smoking history. He has quit using smokeless tobacco.  His smokeless tobacco use included chew. He reports current drug use. Drug: Marijuana. He reports that he does not drink alcohol.The patient is alone  today.  Allergies:  Allergies  Allergen Reactions   Cetacaine [Butamben-Tetracaine-Benzocaine] Shortness Of Breath and Other (See Comments)    Intense laryngospasm    Current Medications: Current Outpatient Medications  Medication Sig Dispense Refill   valsartan (DIOVAN) 160 MG tablet      amiodarone (PACERONE) 200 MG tablet Please take 2 tablets (400 mg) twice daily on 9/21 only, then take 2 tablets (400 mg) once daily thereafter. (Patient taking differently: Take 400 mg by mouth 2 (two) times daily. Please take 2 tablets (400 mg) twice daily on 9/21 only, then take 2 tablets (400 mg) once daily thereafter.) 64 tablet 1   apixaban (ELIQUIS) 5 MG TABS tablet Take 1 tablet (5 mg total) by mouth 2 (two) times daily. 60 tablet 5   aspirin EC 81 MG tablet Take 1 tablet (81 mg total) by mouth daily. Swallow whole. 150 tablet 2   Aspirin-Caffeine (BC FAST PAIN RELIEF PO) Take 1 tablet by mouth as needed (fever).     atorvastatin (LIPITOR) 80 MG tablet Take 1 tablet (80 mg total) by mouth daily. 30 tablet 1   empagliflozin (JARDIANCE) 10 MG TABS tablet Take 1 tablet (10 mg total) by mouth daily. 30 tablet 1   hydrochlorothiazide (HYDRODIURIL) 25 MG tablet Take 25 mg by mouth daily.     isosorbide mononitrate (IMDUR) 60 MG 24 hr tablet Take 60 mg by mouth daily.     latanoprost (XALATAN) 0.005 % ophthalmic solution Place 1 drop into the right eye at bedtime.     metFORMIN (GLUCOPHAGE) 1000 MG tablet Take 1,000 mg by mouth 2 (two) times daily with a meal.     metoprolol (LOPRESSOR) 50 MG tablet Take 1 tablet (50 mg total) by mouth 2 (two) times daily. 180 tablet 2   oxyCODONE (OXY IR/ROXICODONE) 5 MG immediate release tablet Take 1 tablet (5 mg total) by mouth every 6 (six) hours as needed for severe pain. 28 tablet 0   pantoprazole (PROTONIX) 40 MG tablet Take 1 tablet (40 mg total) by mouth daily. (Patient taking differently: Take 40 mg by mouth daily before breakfast.) 90 tablet 3  PATADAY 0.2 %  SOLN Place 1 drop into the right eye 2 (two) times daily.     terazosin (HYTRIN) 1 MG capsule Take 1 mg by mouth daily.     Tiotropium Bromide Monohydrate (SPIRIVA RESPIMAT) 2.5 MCG/ACT AERS Inhale 1 puff into the lungs 2 (two) times daily.     No current facility-administered medications for this visit.

## 2020-12-19 NOTE — Assessment & Plan Note (Addendum)
History of recurrent iron deficiency anemia status post IV iron replacement with Feraheme in June and October. His hemoglobin has improved significantly. Iron studies are pending from today. We will plan to see him back in 3 months with a CBC, comprehensive metabolic panel and iron studies for repeat clinical assessment.

## 2021-01-03 ENCOUNTER — Telehealth: Payer: Self-pay

## 2021-01-03 NOTE — Telephone Encounter (Signed)
Patient states he has been waiting on clearance for a month. He says his dentist office has not received it and the second clearance request sent in is for the same extraction.

## 2021-01-03 NOTE — Telephone Encounter (Signed)
   [facility] Pre-operative Risk Assessment    Patient Name: Vincent Guerrero  DOB: 09/26/1960 MRN: [id number]    Request for surgical clearance:  What type of surgery is being performed  Surgical extraction  When is this surgery scheduled  TBD  What type of clearance is required   Both  Are there any medications that need to be held prior to surgery and how long  Aspirin and Eliquis  Practice name and name of physician performing surgery  [facility]    What is the office phone number [phone]   7.   What is the office fax number  [phone]  8.   Anesthesia type   Not listed   Neoma Laming 01/03/2021, 1:08 PM  _________________________________________________________________   (provider comments below)

## 2021-01-03 NOTE — Telephone Encounter (Signed)
Pt is having 2 extractions

## 2021-01-03 NOTE — Telephone Encounter (Signed)
   Patient Name: Vincent Guerrero  DOB: 04/01/1960 MRN: [id number]  Primary Cardiologist: Thomasene Ripple, DO  Chart reviewed as part of pre-operative protocol coverage.   Dental extractions of one or two teeth are considered low risk procedures per guidelines and generally do not require any specific cardiac clearance. It is also generally accepted that for 1-2 extractions and dental cleanings, there is no need to interrupt blood thinner therapy.  Given his recent CABG and AVR complicated by Afib and PE, we do NOT recommend interruption of ASA or eliquis therapy.    SBE prophylaxis is required for the patient from a cardiac standpoint. Per prior notes, dental office submitted ABX prescription. Please reach out to our office if he does not have ABX available to him.  I will route this recommendation to the requesting party via Epic fax function and remove from pre-op pool.  Please call with questions.  Vincent Rutherford Dean Wonder, PA 01/03/2021, 4:59 PM

## 2021-01-04 NOTE — Telephone Encounter (Signed)
I will re-fax the completed risk assessment form below from 12/01/2020 to the fax number provided.  Corrin Parker, PA-C 01/04/2021 9:30 AM

## 2021-01-05 ENCOUNTER — Encounter: Payer: Self-pay | Admitting: Cardiology

## 2021-01-05 ENCOUNTER — Ambulatory Visit: Payer: Medicaid Other | Admitting: Cardiology

## 2021-01-05 ENCOUNTER — Other Ambulatory Visit: Payer: Self-pay

## 2021-01-05 VITALS — BP 128/70 | HR 60 | Ht 70.0 in | Wt 185.0 lb

## 2021-01-05 DIAGNOSIS — Z952 Presence of prosthetic heart valve: Secondary | ICD-10-CM

## 2021-01-05 DIAGNOSIS — I2510 Atherosclerotic heart disease of native coronary artery without angina pectoris: Secondary | ICD-10-CM | POA: Diagnosis not present

## 2021-01-05 DIAGNOSIS — E119 Type 2 diabetes mellitus without complications: Secondary | ICD-10-CM | POA: Diagnosis not present

## 2021-01-05 DIAGNOSIS — I480 Paroxysmal atrial fibrillation: Secondary | ICD-10-CM

## 2021-01-05 DIAGNOSIS — E782 Mixed hyperlipidemia: Secondary | ICD-10-CM

## 2021-01-05 DIAGNOSIS — Z955 Presence of coronary angioplasty implant and graft: Secondary | ICD-10-CM

## 2021-01-05 NOTE — Progress Notes (Signed)
Cardiology Office Note:    Date:  01/05/2021   ID:  Vincent Guerrero, DOB Mar 21, 1960, MRN [id number]  PCP:  No primary care provider on file.  Cardiologist:  Berniece Salines, DO  Electrophysiologist:  None   Referring MD: No ref. provider found   Chief Complaint  Patient presents with   Follow-up    10 weeks.    History of Present Illness:    Vincent Guerrero is a 60 y.o. male with a hx of  coronary artery disease status post stent in the RCA a result of an MI in 2010, hyperlipidemia (not on statins due to statin intolerance), diabetes mellitus (on Metformin and glipizide),, current smoker CVA in 2011, moderate aortic stenosis in April 2018.    In October the patient was short of breath were concerned that this may have been coronary artery disease therefore I set the patient up for a left heart catheterization but unfortunately his hemoglobin was significantly low.    He was seen by with GI he has had a colonoscopy which showed some ulcers.     When I saw the patient in March 2022 he had stopped his Repatha as well as his Imdur.  He felt both of these medication was playing with his mind.  I therefore placed the patient on Ranexa to help with any angina symptoms.  He had some wheezing that they and I referred the patient to pulmonary as he was not treated for likely undiagnosed COPD given his long smoking history.  His blood pressure was also elevated that day when asked the patient said he had not taken his blood pressure medication.   I saw the patient on June 02, 2020 he had complained of some shortness of breath and chest tightness at the time my office he has been wheezing.  He reported he had not been using his inhalers.  Placed a referral for pulmonary as well.  During that visit he had not been taking the Ranexa as recommended and he had previously stopped the Imdur.  His hemoglobin was also low during that visit which appeared to be dropping.  And he had stopped the Protonix.   The patient was here with his fiance that day I explained to him that for his angina he needed to stay on the Ranexa and also hopefully if he could tolerate the Imdur.  I restarted on the Ranexa at that visit.  I also explained to the patient that giving his history of GI bleeding he would need to stay on the Protonix.     I saw the patient in May 2022 at that time we will set up for him to see GI as well as hematology.  He missed his GI appointment but was able to see hematology and has been started on iron treatment.   I saw the patient on September 2 07/25/2020 this was after hip surgery.  S prior to his surgery he he underwent various testing including TEE, right and left heart catheterization ultimately he is status post surgical aortic valve replacement with coronary artery bypass grafting x4 which was done on October 14, 2020.  Dominant surgical.  The patient hospital stay was complicated with postop atrial fibrillation and was started on amiodarone.  Postsurgery the patient was hospitalized at [facility] after he presented for shortness of breath and chest pain.  He was noted to have multiple segmental pulmonary embolism.  And he was started on apixaban 5 mg twice daily.  At  his visit on September 2 07/25/2020 he seems to have been recovering well from his surgery.  He was continue to take his Eliquis.  I kept the patient on the amiodarone given his postop atrial fibrillation as well.  During that visit we referred the patient to cardiac rehab here at [facility]. Since I saw the patient he tells me he has been doing well.  He is back to work and he is slowly easing into his activities.  He offers no complaints today.     Past Medical History:  Diagnosis Date   Age-related nuclear cataract of both eyes 11/27/2018   Anemia    Bilateral recurrent inguinal hernia without obstruction or gangrene 05/09/2016   Added automatically from request for surgery [id number]   Coronary artery disease  involving native coronary artery without angina pectoris 05/2016   History of PCI to nondominant RCA. ->  Cath in April 2018 showed occluded nondominant RCA stent.  30% LM.  pLAD 40%.  Ostial RI 60%.   Cortical age-related cataract of both eyes 11/27/2018   Depression    Diabetes mellitus ([place])    Hyperlipidemia LDL goal <70    Hypertension    Inguinal hernia without obstruction or gangrene    BILATERAL   Primary open angle glaucoma (POAG) of both eyes, severe stage 11/27/2018   Primary open angle glaucoma (POAG) of left eye, severe stage 11/27/2018   Stroke ([place])    Tobacco abuse 11/05/2019   Tobacco use 11/06/2019    Past Surgical History:  Procedure Laterality Date   AORTIC VALVE REPLACEMENT N/A 10/14/2020   Procedure: AORTIC VALVE REPLACEMENT (AVR) USING 25MM INSPIRIS RESILIA AORTIC VALVE;  Surgeon: Gaye Pollack, MD;  Location: [place];  Service: Open Heart Surgery;  Laterality: N/A;   BIOPSY  11/27/2019   Procedure: BIOPSY;  Surgeon: Doran Stabler, MD;  Location: [place];  Service: Gastroenterology;;   COLONOSCOPY WITH PROPOFOL N/A 11/27/2019   Procedure: COLONOSCOPY WITH PROPOFOL;  Surgeon: Doran Stabler, MD;  Location: [place];  Service: Gastroenterology;  Laterality: N/A;   CORONARY ARTERY BYPASS GRAFT N/A 10/14/2020   Procedure: CORONARY ARTERY BYPASS GRAFTING (CABG) TIMES FOUR, USING LEFT INTERNAL MAMMARY ARTERY AND RIGHT GREATER SAPHENOUS VEIN HARVESTED ENDOSCOPICALLY;  Surgeon: Gaye Pollack, MD;  Location: [place];  Service: Open Heart Surgery;  Laterality: N/A;   ESOPHAGOGASTRODUODENOSCOPY (EGD) WITH PROPOFOL N/A 11/27/2019   Procedure: ESOPHAGOGASTRODUODENOSCOPY (EGD) WITH PROPOFOL;  Surgeon: Doran Stabler, MD;  Location: [place];  Service: Gastroenterology;  Laterality: N/A;   HEMOSTASIS CLIP PLACEMENT  11/27/2019   Procedure: HEMOSTASIS CLIP PLACEMENT;  Surgeon: Doran Stabler, MD;  Location: [place];  Service: Gastroenterology;;    [place] ULTRASOUND/IVUS N/A 09/12/2020   Procedure: Intravascular Ultrasound/IVUS;  Surgeon: Leonie Man, MD;  Location: [place] CV LAB;  Service: Cardiovascular;  Laterality: N/A;   LEFT HEART CATH AND CORONARY ANGIOGRAPHY N/A 06/04/2016   Procedure: Left Heart Cath and Coronary Angiography;  Surgeon: Belva Crome, MD;  Location: [place] CV LAB;  Service: Cardiovascular;  Laterality: N/A;   RIGHT/LEFT HEART CATH AND CORONARY ANGIOGRAPHY N/A 09/12/2020   Procedure: RIGHT/LEFT HEART CATH AND CORONARY ANGIOGRAPHY;  Surgeon: Leonie Man, MD;  Location: [place] CV LAB;  Service: Cardiovascular;  Laterality: N/A;   TEE WITHOUT CARDIOVERSION N/A 08/30/2020   Procedure: TRANSESOPHAGEAL ECHOCARDIOGRAM (TEE);  Surgeon: Acie Fredrickson Wonda Cheng, MD;  Location: [place];  Service: Cardiovascular;  Laterality: N/A;   TEE WITHOUT CARDIOVERSION N/A 10/14/2020   Procedure: TRANSESOPHAGEAL ECHOCARDIOGRAM (TEE);  Surgeon: Alleen Borne, MD;  Location: [facility] OR;  Service: Open Heart Surgery;  Laterality: N/A;    Current Medications: Current Meds  Medication Sig   amiodarone (PACERONE) 200 MG tablet Take 1 tablet daily until prescription is gone, then you can discontinue medication   apixaban (ELIQUIS) 5 MG TABS tablet Take 1 tablet (5 mg total) by mouth 2 (two) times daily.   aspirin EC 81 MG tablet Take 1 tablet (81 mg total) by mouth daily. Swallow whole.   atorvastatin (LIPITOR) 80 MG tablet Take 1 tablet (80 mg total) by mouth daily.   empagliflozin (JARDIANCE) 10 MG TABS tablet Take 1 tablet (10 mg total) by mouth daily.   hydrochlorothiazide (HYDRODIURIL) 25 MG tablet Take 25 mg by mouth daily.   isosorbide mononitrate (IMDUR) 60 MG 24 hr tablet Take 60 mg by mouth daily.   latanoprost (XALATAN) 0.005 % ophthalmic solution Place 1 drop into the right eye at bedtime.   metFORMIN (GLUCOPHAGE) 1000 MG tablet Take 1,000 mg by mouth 2 (two) times daily with a meal.    metoprolol (LOPRESSOR) 50 MG tablet Take 1 tablet (50 mg total) by mouth 2 (two) times daily.   pantoprazole (PROTONIX) 40 MG tablet Take 1 tablet (40 mg total) by mouth daily. (Patient taking differently: Take 40 mg by mouth daily before breakfast.)   PATADAY 0.2 % SOLN Place 1 drop into the right eye 2 (two) times daily.   terazosin (HYTRIN) 1 MG capsule Take 1 mg by mouth daily.   Tiotropium Bromide Monohydrate (SPIRIVA RESPIMAT) 2.5 MCG/ACT AERS Inhale 1 puff into the lungs 2 (two) times daily.   traMADol (ULTRAM) 50 MG tablet Take 1 tablet (50 mg total) by mouth every 8 (eight) hours as needed.     Allergies:   Cetacaine [butamben-tetracaine-benzocaine]   Social History   Socioeconomic History   Marital status: Legally Separated    Spouse name: Not on file   Number of children: Not on file   Years of education: Not on file   Highest education level: Not on file  Occupational History   Not on file  Tobacco Use   Smoking status: Every Day    Packs/day: 1.00    Years: 40.00    Pack years: 40.00    Types: Cigarettes   Smokeless tobacco: Former    Types: Chew   Tobacco comments:    half pack daily-05/05/2020-AH  Vaping Use   Vaping Use: Never used  Substance and Sexual Activity   Alcohol use: No   Drug use: Yes    Types: Marijuana    Comment: 1-2 times per month   Sexual activity: Yes  Other Topics Concern   Not on file  Social History Narrative   Not on file   Social Determinants of Health   Financial Resource Strain: Not on file  Food Insecurity: Not on file  Transportation Needs: Not on file  Physical Activity: Not on file  Stress: Not on file  Social Connections: Not on file     Family History: The patient's family history includes CAD in his father; Diabetes in his father; Heart failure in his mother; Hypertension in his mother; Prostate cancer in his brother.  ROS:   Review of Systems  Constitution: Negative for decreased appetite, fever and weight  gain.  HENT: Negative for congestion, ear discharge, hoarse voice and sore throat.   Eyes: Negative  for discharge, redness, vision loss in right eye and visual halos.  Cardiovascular: Negative for chest pain, dyspnea on exertion, leg swelling, orthopnea and palpitations.  Respiratory: Negative for cough, hemoptysis, shortness of breath and snoring.   Endocrine: Negative for heat intolerance and polyphagia.  Hematologic/Lymphatic: Negative for bleeding problem. Does not bruise/bleed easily.  Skin: Negative for flushing, nail changes, rash and suspicious lesions.  Musculoskeletal: Negative for arthritis, joint pain, muscle cramps, myalgias, neck pain and stiffness.  Gastrointestinal: Negative for abdominal pain, bowel incontinence, diarrhea and excessive appetite.  Genitourinary: Negative for decreased libido, genital sores and incomplete emptying.  Neurological: Negative for brief paralysis, focal weakness, headaches and loss of balance.  Psychiatric/Behavioral: Negative for altered mental status, depression and suicidal ideas.  Allergic/Immunologic: Negative for HIV exposure and persistent infections.    EKGs/Labs/Other Studies Reviewed:    The following studies were reviewed today:   EKG: None today  TTE 11/28/2020 IMPRESSIONS     1. Left ventricular ejection fraction, by estimation, is 60 to 65%. The  left ventricle has normal function. The left ventricle has no regional  wall motion abnormalities. There is severe concentric left ventricular  hypertrophy. Left ventricular diastolic   parameters are consistent with Grade I diastolic dysfunction (impaired  relaxation). The average left ventricular global longitudinal strain is  -9.4 %. The global longitudinal strain is abnormal.   2. Right ventricular systolic function is normal. The right ventricular  size is normal. There is normal pulmonary artery systolic pressure.   3. The mitral valve is normal in structure. No evidence of  mitral valve  regurgitation. No evidence of mitral stenosis.   4. Aortic valve regurgitation is not visualized. There is a 25MM INSPIRIS  RESILIA AORTIC VALVE valve present in the aortic position. Procedure Date:  10/14/2020. Echo findings are consistent with normal structure and  function of the aortic valve  prosthesis.   5. The inferior vena cava is normal in size with greater than 50%  respiratory variability, suggesting right atrial pressure of 3 mmHg.   FINDINGS   Left Ventricle: Left ventricular ejection fraction, by estimation, is 60  to 65%. The left ventricle has normal function. The left ventricle has no  regional wall motion abnormalities. The average left ventricular global  longitudinal strain is -9.4 %. The   global longitudinal strain is abnormal. The left ventricular internal  cavity size was normal in size. There is severe concentric left  ventricular hypertrophy. Left ventricular diastolic parameters are  consistent with Grade I diastolic dysfunction  (impaired relaxation). Indeterminate filling pressures.   Right Ventricle: The right ventricular size is normal. No increase in  right ventricular wall thickness. Right ventricular systolic function is  normal. There is normal pulmonary artery systolic pressure. The tricuspid  regurgitant velocity is 1.87 m/s, and   with an assumed right atrial pressure of 3 mmHg, the estimated right  ventricular systolic pressure is 123XX123 mmHg.   Left Atrium: Left atrial size was normal in size.   Right Atrium: Right atrial size was normal in size.   Pericardium: There is no evidence of pericardial effusion.   Mitral Valve: The mitral valve is normal in structure. No evidence of  mitral valve regurgitation. No evidence of mitral valve stenosis.   Tricuspid Valve: The tricuspid valve is normal in structure. Tricuspid  valve regurgitation is not demonstrated. No evidence of tricuspid  stenosis.   Aortic Valve: Aortic valve  regurgitation is not visualized. Aortic valve  mean gradient measures 11.5 mmHg. Aortic  valve peak gradient measures 19.8  mmHg. Aortic valve area, by VTI measures 1.39 cm. There is a 25MM  INSPIRIS RESILIA AORTIC VALVE valve  present in the aortic position. Procedure Date: 10/14/2020. Echo findings  are consistent with normal structure and function of the aortic valve  prosthesis.   Pulmonic Valve: The pulmonic valve was normal in structure. Pulmonic valve  regurgitation is trivial. No evidence of pulmonic stenosis.   Aorta: The aortic arch was not well visualized and the aortic root and  ascending aorta are structurally normal, with no evidence of dilitation.   Venous: The pulmonary veins were not well visualized. The inferior vena  cava is normal in size with greater than 50% respiratory variability,  suggesting right atrial pressure of 3 mmHg.   IAS/Shunts: No atrial level shunt detected by color flow Doppler.   Recent Labs: 07/06/2020: TSH 1.171 10/15/2020: Magnesium 1.9 10/23/2020: B Natriuretic Peptide 168.0 12/19/2020: ALT 60; BUN 41; Creatinine 1.2; Hemoglobin 13.5; Platelets 219; Potassium 3.7; Sodium 138  Recent Lipid Panel    Component Value Date/Time   CHOL 82 11/27/2019 1004   CHOL 172 12/10/2018 1058   TRIG 60 11/27/2019 1004   HDL 37 (L) 11/27/2019 1004   HDL 43 12/10/2018 1058   CHOLHDL 2.2 11/27/2019 1004   VLDL 12 11/27/2019 1004   LDLCALC 33 11/27/2019 1004   LDLCALC 100 (H) 12/10/2018 1058    Physical Exam:    VS:  BP 128/70 (BP Location: Left Arm, Patient Position: Sitting, Cuff Size: Normal)   Pulse 60   Ht 5\' 10"  (1.778 m)   Wt 185 lb (83.9 kg)   BMI 26.54 kg/m     Wt Readings from Last 3 Encounters:  01/05/21 185 lb (83.9 kg)  12/19/20 182 lb (82.6 kg)  12/19/20 182 lb 4.8 oz (82.7 kg)     GEN: Well nourished, well developed in no acute distress HEENT: Normal NECK: No JVD; No carotid bruits LYMPHATICS: No lymphadenopathy CARDIAC: S1S2  noted,RRR, no murmurs, rubs, gallops RESPIRATORY:  Clear to auscultation without rales, wheezing or rhonchi  ABDOMEN: Soft, non-tender, non-distended, +bowel sounds, no guarding. EXTREMITIES: No edema, No cyanosis, no clubbing MUSCULOSKELETAL:  No deformity  SKIN: Warm and dry NEUROLOGIC:  Alert and oriented x 3, non-focal PSYCHIATRIC:  Normal affect, good insight  ASSESSMENT:    1. Coronary artery disease involving native coronary artery of native heart, unspecified whether angina present   2. S/P AVR (aortic valve replacement)   3. Presence of stent in coronary artery in patient with coronary artery disease   4. Postoperative atrial fibrillation.   5. Type 2 diabetes mellitus without complication, without long-term current use of insulin ([place])   6. Mixed hyperlipidemia    PLAN:     Very happy for the patient he appears to be doing well from cardiovascular standpoint.  He has improved significantly since his surgery.  Also able to review his echocardiogram which was done in October 2024 to assess his new valve which his valve appears to be seated well.  He tells me that sometimes he feels tired after some activities but he notes that he is overdoing it at times and he has to catch himself. His improvement is great, his symptoms of chest discomfort has improved since his surgery.  He is doing well on the Eliquis had not had any shortness of breath.  Coronary artery disease-denies any anginal symptoms we will continue his current medication regimen.  Diabetes mellitus-this is being managed by his  primary care doctor.  No adjustments for antidiabetic medications were made today.  In terms of her postoperative atrial fibrillation, I will stop the amiodarone.  By his next visit I will place a monitor on the patient to assess for any A. fib as this should have resolved  The patient is in agreement with the above plan. The patient left the office in stable condition.  The patient will follow  up in 6 months or sooner if needed.   Medication Adjustments/Labs and Tests Ordered: Current medicines are reviewed at length with the patient today.  Concerns regarding medicines are outlined above.  No orders of the defined types were placed in this encounter.  No orders of the defined types were placed in this encounter.   Patient Instructions  Medication Instructions:  Your physician recommends that you continue on your current medications as directed. Please refer to the Current Medication list given to you today.  *If you need a refill on your cardiac medications before your next appointment, please call your pharmacy*   Lab Work: None If you have labs (blood work) drawn today and your tests are completely normal, you will receive your results only by: [place] (if you have MyChart) OR A paper copy in the mail If you have any lab test that is abnormal or we need to change your treatment, we will call you to review the results.   Testing/Procedures: None   Follow-Up: At [facility], you and your health needs are our priority.  As part of our continuing mission to provide you with exceptional heart care, we have created designated Provider Care Teams.  These Care Teams include your primary Cardiologist (physician) and Advanced Practice Providers (APPs -  Physician Assistants and Nurse Practitioners) who all work together to provide you with the care you need, when you need it.  We recommend signing up for the patient portal called "MyChart".  Sign up information is provided on this After Visit Summary.  MyChart is used to connect with patients for Virtual Visits (Telemedicine).  Patients are able to view lab/test results, encounter notes, upcoming appointments, etc.  Non-urgent messages can be sent to your provider as well.   To learn more about what you can do with MyChart, go to [email or web address].    Your next appointment:   6 month(s)  The format for your  next appointment:   In Person  Provider:   Berniece Salines, DO     Other Instructions     Adopting a Healthy Lifestyle.  Know what a healthy weight is for you (roughly BMI <25) and aim to maintain this   Aim for 7+ servings of fruits and vegetables daily   65-80+ fluid ounces of water or unsweet tea for healthy kidneys   Limit to max 1 drink of alcohol per day; avoid smoking/tobacco   Limit animal fats in diet for cholesterol and heart health - choose grass fed whenever available   Avoid highly processed foods, and foods high in saturated/trans fats   Aim for low stress - take time to unwind and care for your mental health   Aim for 150 min of moderate intensity exercise weekly for heart health, and weights twice weekly for bone health   Aim for 7-9 hours of sleep daily   When it comes to diets, agreement about the perfect plan isnt easy to find, even among the experts. Experts at the [employer] developed an idea known as the [employer]  Plate. Just imagine a plate divided into logical, healthy portions.   The emphasis is on diet quality:   Load up on vegetables and fruits - one-half of your plate: Aim for color and variety, and remember that potatoes dont count.   Go for whole grains - one-quarter of your plate: Whole wheat, barley, wheat berries, quinoa, oats, brown rice, and foods made with them. If you want pasta, go with whole wheat pasta.   Protein power - one-quarter of your plate: Fish, chicken, beans, and nuts are all healthy, versatile protein sources. Limit red meat.   The diet, however, does go beyond the plate, offering a few other suggestions.   Use healthy plant oils, such as olive, canola, soy, corn, sunflower and peanut. Check the labels, and avoid partially hydrogenated oil, which have unhealthy trans fats.   If youre thirsty, drink water. Coffee and tea are good in moderation, but skip sugary drinks and limit milk and dairy  products to one or two daily servings.   The type of carbohydrate in the diet is more important than the amount. Some sources of carbohydrates, such as vegetables, fruits, whole grains, and beans-are healthier than others.   Finally, stay active  Signed, Berniece Salines, DO  01/05/2021 9:31 PM    [facility]

## 2021-01-05 NOTE — Patient Instructions (Signed)

## 2021-01-06 ENCOUNTER — Telehealth: Payer: Self-pay

## 2021-01-06 ENCOUNTER — Other Ambulatory Visit: Payer: Self-pay

## 2021-01-06 NOTE — Telephone Encounter (Signed)
Called pt to talk about his amiodarone prescription. No answer at this time and the voicemail is full. Unable to leave a message.

## 2021-01-06 NOTE — Progress Notes (Signed)
Called pt to let him know Dr. Servando Salina wants him to stop taking Amiodarone he states he was already taken off of it (there were not any more refills.) We went over his medication list and updated the list we have.

## 2021-03-16 NOTE — Progress Notes (Incomplete)
[facility]  [address] [place],  [place]  [address] [phone]  Clinic Day:  03/16/2021  Referring physician: No ref. provider found  This document serves as a record of services personally performed by Hosie Poisson, MD. It was created on their behalf by Curry,Lauren E, a trained medical scribe. The creation of this record is based on the scribe's personal observations and the provider's statements to them.  ASSESSMENT & PLAN:   Assessment & Plan: 1. Iron deficiency anemia, felt to be due to chronic GI blood loss and malabsorption.  He had good improvement in his iron stores with IV Feraheme in June. We will schedule him for this again.   2. Severe aortic stenosis requiring aortic valve replacement and coronary artery bypass grafting on September 9th.    3. Three left lower lobe pulmonary emboli, 1 segmental and 2 subsegmental, September 2022. He has been placed on Eliquis starting at 10 mg BID to then be decreased to 5 mg daily.    4. Atrial fibrillation, resolved. He continues amiodarone 400 mg daily.   5. Vitamin-D insufficiency, he is on vitamin D3 1000 international units daily.   His hemoglobin has improved significantly. Iron studies are pending from today. We will plan to see him back in 3 months with a CBC, comprehensive metabolic panel and iron studies for repeat clinical assessment. The patient understands the plans discussed today and is in agreement with them.  He knows to contact our office if he develops symptoms of worsening anemia or other concerns prior to his next appointment.   [facility] [address] [place] [address] Dept: [phone] Dept Fax: [phone]   No orders of the defined types were placed in this encounter.    CHIEF COMPLAINT:  CC: Recurrent iron deficiency anemia  Current Treatment:  IV iron replacement as  needed   HISTORY OF PRESENT ILLNESS:  Vincent Guerrero is a 61 y.o. male with a history of recurrent severe iron deficiency anemia despite oral iron supplementation. He was seen by Dr. Alvy Bimler in June and his hemoglobin was 9.1 at that time. Iron studies were consistent with iron deficiency.  B12 was normal.  TSH was normal.  Vitamin-D was mildly low.  Review of his CBCs dating back to April 2018, revealed his hemoglobin ranged from 7.3-12.5, always with low MCV. Last year, he presented with severe anemia requiring blood transfusion. He underwent EGD and colonoscopy in October 2021. EGD revealed non-bleeding gastric ulcers.  He continued taking aspirin-containing products and using NSAIDs, so was instructed to stop these. The patient was symptomatic from anemia and the iron level was critically low, so he was given IV iron in the form of Feraheme with a goal to correct his anemia and iron deficiency and avoid transfusion support. When he was seen in July, his hemoglobin was up to 11.9. Folate, LDH and haptoglobin were all normal. Serum protein electrophoresis was negative for monoclonal spike. He was admitted on September 9th for severe aortic stenosis and had aortic valve replacement and coronary artery bypass grafting. Hemoglobin prior to surgery was 9.2, and down to 8.1 at the lowest following the procedure. He was discharged on September 15th on amiodarone due to atrial fibrillation and was advised to take aspirin 325 mg daily.   Brevan was seen again on September 28th and had been admitted to [facility] on September 18th due  to multiple pulmonary emboli. Imaging confirmed three left lower lobe pulmonary emboli, 1 segmental and 2 subsegmental. Hemoglobin at that time was up to 9.5. He was placed on Eliquis 10 mg BID to be switched to 5 mg daily after loading dose.  He was advised to decrease aspirin to 81 mg. He will need to stay on anticoagulation for at least 6 months, but if he remains  in atrial fibrillation, he will require this longer. Hemoglobin at the time of discharge was down to 8.0 with an MCV OF 82. He was not taking oral iron supplement, as he does not tolerate them. He denied any evidence of overt bleeding. He continued aspirin 81 mg daily occasionally taking an extra dose. He was advised that he avoid extra doses in view of the risk for GI bleed.  INTERVAL HISTORY:  [name] is here today for repeat clinical assessment. He denies any overt form of blood loss.  He denies progressive fatigue concerning for worsening anemia.  He reports persistent anterior chest pain, attributed to his CABG incision.  He denies dyspnea.  He no longer has oxycodone to take as needed. He has not contacted his surgeon for a refill.  I encouraged him to do so. He continues apixaban 5 mg and aspirin 81 mg daily.  He continues to report a cough productive of white sputum , which is unchanged. He denies fevers or chills. He denies other signs of infection such as diarrhea or urinary symptoms. He denies pain. His appetite is decreased. His weight has decreased 9 pounds over last month , but he has lost fluid. He has started walking occasionally, but does become fatigued after this. He has decreased smoking with plans to quit.  He currently smokes about half pack per day, which is decreased from 1-1/2 pack per day.  [name] is here for routine follow up ***.   His  appetite is good, and he has gained/lost _ pounds since his last visit.  He denies fever, chills or other signs of infection.  He denies nausea, vomiting, bowel issues, or abdominal pain.  He denies sore throat, cough, dyspnea, or chest pain.  REVIEW OF SYSTEMS:  Review of Systems  Constitutional: Negative.  Negative for appetite change, chills, fatigue, fever and unexpected weight change.  HENT:  Negative.    Eyes: Negative.   Respiratory: Negative.  Negative for chest tightness, cough, hemoptysis, shortness of breath and wheezing.    Cardiovascular: Negative.  Negative for chest pain, leg swelling and palpitations.  Gastrointestinal: Negative.  Negative for abdominal distention, abdominal pain, blood in stool, constipation, diarrhea, nausea and vomiting.  Endocrine: Negative.   Genitourinary: Negative.  Negative for difficulty urinating, dysuria, frequency and hematuria.   Musculoskeletal: Negative.  Negative for arthralgias, back pain, flank pain, gait problem and myalgias.  Skin: Negative.   Neurological: Negative.  Negative for dizziness, extremity weakness, gait problem, headaches, light-headedness, numbness, seizures and speech difficulty.  Hematological: Negative.   Psychiatric/Behavioral: Negative.  Negative for depression and sleep disturbance. The patient is not nervous/anxious.   All other systems reviewed and are negative.   VITALS:  There were no vitals taken for this visit.  Wt Readings from Last 3 Encounters:  01/05/21 185 lb (83.9 kg)  12/19/20 182 lb (82.6 kg)  12/19/20 182 lb 4.8 oz (82.7 kg)    There is no height or weight on file to calculate BMI.  Performance status (ECOG): 1 - Symptomatic but completely ambulatory  PHYSICAL EXAM:  Physical Exam Constitutional:  General: He is not in acute distress.    Appearance: Normal appearance. He is normal weight.  HENT:     Head: Normocephalic and atraumatic.  Eyes:     General: No scleral icterus.    Extraocular Movements: Extraocular movements intact.     Conjunctiva/sclera: Conjunctivae normal.     Pupils: Pupils are equal, round, and reactive to light.  Cardiovascular:     Rate and Rhythm: Normal rate and regular rhythm.     Pulses: Normal pulses.     Heart sounds: Normal heart sounds. No murmur heard.   No friction rub. No gallop.  Pulmonary:     Effort: Pulmonary effort is normal. No respiratory distress.     Breath sounds: Normal breath sounds.  Abdominal:     General: Bowel sounds are normal. There is no distension.      Palpations: Abdomen is soft. There is no hepatomegaly, splenomegaly or mass.     Tenderness: There is no abdominal tenderness.  Musculoskeletal:        General: Normal range of motion.     Cervical back: Normal range of motion and neck supple.     Right lower leg: No edema.     Left lower leg: No edema.  Lymphadenopathy:     Cervical: No cervical adenopathy.  Skin:    General: Skin is warm and dry.  Neurological:     General: No focal deficit present.     Mental Status: He is alert and oriented to person, place, and time. Mental status is at baseline.  Psychiatric:        Mood and Affect: Mood normal.        Behavior: Behavior normal.        Thought Content: Thought content normal.        Judgment: Judgment normal.    LABS:   CBC Latest Ref Rng & Units 12/19/2020 11/02/2020 10/25/2020  WBC - 14.0 10.0 19.6(H)  Hemoglobin 13.5 - 17.5 13.5 8.8(A) 8.0(L)  Hematocrit 41 - 53 42 28(A) 25.3(L)  Platelets 150 - 399 219 462(A) 564(H)   CMP Latest Ref Rng & Units 12/19/2020 11/02/2020 10/25/2020  Glucose 70 - 99 mg/dL - - 109(H)  BUN 4 - 21 41(A) 21 19  Creatinine 0.6 - 1.3 1.2 1.3 1.06  Sodium 137 - 147 138 138 137  Potassium 3.4 - 5.3 3.7 4.5 3.7  Chloride 99 - 108 101 102 101  CO2 13 - 22 26(A) 25(A) 26  Calcium 8.7 - 10.7 9.3 9.4 8.5(L)  Total Protein 6.5 - 8.1 g/dL - - -  Total Bilirubin 0.3 - 1.2 mg/dL - - -  Alkaline Phos 25 - 125 120 108 -  AST 14 - 40 47(A) 27 -  ALT 10 - 40 60(A) 15 -    Lab Results  Component Value Date   TOTALPROTELP 6.3 09/01/2020   ALBUMINELP 3.7 09/01/2020   A1GS 0.2 09/01/2020   A2GS 0.7 09/01/2020   BETS 0.9 09/01/2020   GAMS 0.7 09/01/2020   MSPIKE Not Observed 09/01/2020   Lab Results  Component Value Date   TIBC 346 12/19/2020   TIBC 323 11/02/2020   TIBC 346 09/01/2020   FERRITIN 195 12/19/2020   FERRITIN 52 11/02/2020   FERRITIN 40 09/01/2020   IRONPCTSAT 17 (L) 12/19/2020   IRONPCTSAT 6 (L) 11/02/2020   IRONPCTSAT 14 (L)  09/01/2020   Lab Results  Component Value Date   LDH 137 09/01/2020    STUDIES:  No  results found.    HISTORY:   Allergies:  Allergies  Allergen Reactions   Cetacaine [Butamben-Tetracaine-Benzocaine] Shortness Of Breath and Other (See Comments)    Intense laryngospasm    Current Medications: Current Outpatient Medications  Medication Sig Dispense Refill   apixaban (ELIQUIS) 5 MG TABS tablet Take 1 tablet (5 mg total) by mouth 2 (two) times daily. 60 tablet 5   aspirin EC 81 MG tablet Take 1 tablet (81 mg total) by mouth daily. Swallow whole. 150 tablet 2   atorvastatin (LIPITOR) 80 MG tablet Take 1 tablet (80 mg total) by mouth daily. 30 tablet 1   hydrochlorothiazide (HYDRODIURIL) 25 MG tablet Take 25 mg by mouth daily.     isosorbide mononitrate (IMDUR) 60 MG 24 hr tablet Take 60 mg by mouth daily.     latanoprost (XALATAN) 0.005 % ophthalmic solution Place 1 drop into the right eye at bedtime.     metFORMIN (GLUCOPHAGE) 1000 MG tablet Take 1,000 mg by mouth 2 (two) times daily with a meal.     metoprolol (LOPRESSOR) 50 MG tablet Take 1 tablet (50 mg total) by mouth 2 (two) times daily. 180 tablet 2   pantoprazole (PROTONIX) 40 MG tablet Take 1 tablet (40 mg total) by mouth daily. (Patient taking differently: Take 40 mg by mouth daily before breakfast.) 90 tablet 3   terazosin (HYTRIN) 1 MG capsule Take 1 mg by mouth daily.     Tiotropium Bromide Monohydrate (SPIRIVA RESPIMAT) 2.5 MCG/ACT AERS Inhale 1 puff into the lungs as needed.     traMADol (ULTRAM) 50 MG tablet Take 1 tablet (50 mg total) by mouth every 8 (eight) hours as needed. 40 tablet 0   valsartan (DIOVAN) 160 MG tablet Take 160 mg by mouth daily.     No current facility-administered medications for this visit.    I, Rita Ohara, am acting as scribe for Derwood Kaplan, MD  I have reviewed this report as typed by the medical scribe, and it is complete and accurate.

## 2021-03-21 ENCOUNTER — Other Ambulatory Visit: Payer: Medicaid Other

## 2021-03-21 ENCOUNTER — Ambulatory Visit: Payer: Medicaid Other | Admitting: Oncology

## 2021-03-21 DIAGNOSIS — I2609 Other pulmonary embolism with acute cor pulmonale: Secondary | ICD-10-CM

## 2021-03-21 DIAGNOSIS — D539 Nutritional anemia, unspecified: Secondary | ICD-10-CM

## 2021-04-18 ENCOUNTER — Other Ambulatory Visit (HOSPITAL_COMMUNITY): Payer: Self-pay

## 2021-04-19 ENCOUNTER — Other Ambulatory Visit (HOSPITAL_COMMUNITY): Payer: Self-pay

## 2021-04-27 ENCOUNTER — Other Ambulatory Visit (HOSPITAL_COMMUNITY): Payer: Self-pay

## 2021-06-14 ENCOUNTER — Encounter: Payer: Self-pay | Admitting: Cardiology

## 2021-06-14 ENCOUNTER — Ambulatory Visit: Payer: Medicaid Other | Admitting: Cardiology

## 2021-06-14 VITALS — BP 100/60 | HR 62 | Ht 70.0 in | Wt 196.6 lb

## 2021-06-14 DIAGNOSIS — I2510 Atherosclerotic heart disease of native coronary artery without angina pectoris: Secondary | ICD-10-CM

## 2021-06-14 DIAGNOSIS — Z8673 Personal history of transient ischemic attack (TIA), and cerebral infarction without residual deficits: Secondary | ICD-10-CM | POA: Diagnosis not present

## 2021-06-14 DIAGNOSIS — Z955 Presence of coronary angioplasty implant and graft: Secondary | ICD-10-CM

## 2021-06-14 DIAGNOSIS — I10 Essential (primary) hypertension: Secondary | ICD-10-CM

## 2021-06-14 DIAGNOSIS — Z951 Presence of aortocoronary bypass graft: Secondary | ICD-10-CM

## 2021-06-14 DIAGNOSIS — Z79899 Other long term (current) drug therapy: Secondary | ICD-10-CM | POA: Diagnosis not present

## 2021-06-14 DIAGNOSIS — E119 Type 2 diabetes mellitus without complications: Secondary | ICD-10-CM

## 2021-06-14 DIAGNOSIS — Z952 Presence of prosthetic heart valve: Secondary | ICD-10-CM

## 2021-06-14 DIAGNOSIS — F17200 Nicotine dependence, unspecified, uncomplicated: Secondary | ICD-10-CM

## 2021-06-14 LAB — CBC WITH DIFFERENTIAL/PLATELET
Basophils Absolute: 0.1 10*3/uL (ref 0.0–0.2)
Basos: 0 %
EOS (ABSOLUTE): 0.2 10*3/uL (ref 0.0–0.4)
Eos: 1 %
Hematocrit: 40.9 % (ref 37.5–51.0)
Hemoglobin: 13.1 g/dL (ref 13.0–17.7)
Immature Grans (Abs): 0 10*3/uL (ref 0.0–0.1)
Immature Granulocytes: 0 %
Lymphocytes Absolute: 3.7 10*3/uL — ABNORMAL HIGH (ref 0.7–3.1)
Lymphs: 28 %
MCH: 26.3 pg — ABNORMAL LOW (ref 26.6–33.0)
MCHC: 32 g/dL (ref 31.5–35.7)
MCV: 82 fL (ref 79–97)
Monocytes Absolute: 1.5 10*3/uL — ABNORMAL HIGH (ref 0.1–0.9)
Monocytes: 11 %
Neutrophils Absolute: 7.8 10*3/uL — ABNORMAL HIGH (ref 1.4–7.0)
Neutrophils: 60 %
Platelets: 227 10*3/uL (ref 150–450)
RBC: 4.98 x10E6/uL (ref 4.14–5.80)
RDW: 15.3 % (ref 11.6–15.4)
WBC: 13.2 10*3/uL — ABNORMAL HIGH (ref 3.4–10.8)

## 2021-06-14 NOTE — Progress Notes (Signed)
?Cardiology Office Note:   ? ?Date:  06/18/2021  ? ?ID:  Vincent Guerrero, DOB October 13, 1960, MRN [id number] ? ?PCP:  Ruben Im, PA-C  ?Cardiologist:  Berniece Salines, DO  ?Electrophysiologist:  None  ? ?Referring MD: Ruben Im, PA-C  ? ?" I am doing well" ? ?History of Present Illness:   ? ?Vincent Guerrero is a 61 y.o. male with a hx of coronary artery disease status post PCI and recently CABG x4 with aortic valve replacement from aortic stenosis in September 2022, hyperlipidemia, diabetes mellitus, history of CVA, chronic iron deficiency anemia has had iron infusions, pulmonary embolism postoperative and paroxysmal atrial fibrillation on Eliquis here today for follow-up visit. ? ?I last saw the patient in December 2022 at that time he appeared to be doing well from a cardiovascular standpoint. ? ?Today he offers no complaints. ? ?Past Medical History:  ?Diagnosis Date  ? Age-related nuclear cataract of both eyes 11/27/2018  ? Anemia   ? Bilateral recurrent inguinal hernia without obstruction or gangrene 05/09/2016  ? Added automatically from request for surgery [id number]  ? Coronary artery disease involving native coronary artery without angina pectoris 05/2016  ? History of PCI to nondominant RCA. ->  Cath in April 2018 showed occluded nondominant RCA stent.  30% LM.  pLAD 40%.  Ostial RI 60%.  ? Cortical age-related cataract of both eyes 11/27/2018  ? Depression   ? Diabetes mellitus ([place])   ? Hyperlipidemia LDL goal <70   ? Hypertension   ? Inguinal hernia without obstruction or gangrene   ? BILATERAL  ? Primary open angle glaucoma (POAG) of both eyes, severe stage 11/27/2018  ? Primary open angle glaucoma (POAG) of left eye, severe stage 11/27/2018  ? Stroke [facility])   ? Tobacco abuse 11/05/2019  ? Tobacco use 11/06/2019  ? ? ?Past Surgical History:  ?Procedure Laterality Date  ? AORTIC VALVE REPLACEMENT N/A 10/14/2020  ? Procedure: AORTIC VALVE REPLACEMENT (AVR) USING 25MM INSPIRIS RESILIA AORTIC  VALVE;  Surgeon: Gaye Pollack, MD;  Location: [facility] OR;  Service: Open Heart Surgery;  Laterality: N/A;  ? BIOPSY  11/27/2019  ? Procedure: BIOPSY;  Surgeon: Doran Stabler, MD;  Location: [facility] ENDOSCOPY;  Service: Gastroenterology;;  ? COLONOSCOPY WITH PROPOFOL N/A 11/27/2019  ? Procedure: COLONOSCOPY WITH PROPOFOL;  Surgeon: Doran Stabler, MD;  Location: [facility];  Service: Gastroenterology;  Laterality: N/A;  ? CORONARY ARTERY BYPASS GRAFT N/A 10/14/2020  ? Procedure: CORONARY ARTERY BYPASS GRAFTING (CABG) TIMES FOUR, USING LEFT INTERNAL MAMMARY ARTERY AND RIGHT GREATER SAPHENOUS VEIN HARVESTED ENDOSCOPICALLY;  Surgeon: Gaye Pollack, MD;  Location: [facility];  Service: Open Heart Surgery;  Laterality: N/A;  ? ESOPHAGOGASTRODUODENOSCOPY (EGD) WITH PROPOFOL N/A 11/27/2019  ? Procedure: ESOPHAGOGASTRODUODENOSCOPY (EGD) WITH PROPOFOL;  Surgeon: Doran Stabler, MD;  Location: [facility];  Service: Gastroenterology;  Laterality: N/A;  ? HEMOSTASIS CLIP PLACEMENT  11/27/2019  ? Procedure: HEMOSTASIS CLIP PLACEMENT;  Surgeon: Doran Stabler, MD;  Location: [facility] ENDOSCOPY;  Service: Gastroenterology;;  ? [place]  ? INTRAVASCULAR ULTRASOUND/IVUS N/A 09/12/2020  ? Procedure: Intravascular Ultrasound/IVUS;  Surgeon: Leonie Man, MD;  Location: [facility] CV LAB;  Service: Cardiovascular;  Laterality: N/A;  ? LEFT HEART CATH AND CORONARY ANGIOGRAPHY N/A 06/04/2016  ? Procedure: Left Heart Cath and Coronary Angiography;  Surgeon: Belva Crome, MD;  Location: [place] CV LAB;  Service: Cardiovascular;  Laterality: N/A;  ? RIGHT/LEFT HEART CATH AND  CORONARY ANGIOGRAPHY N/A 09/12/2020  ? Procedure: RIGHT/LEFT HEART CATH AND CORONARY ANGIOGRAPHY;  Surgeon: Leonie Man, MD;  Location: [place] [facility] CV LAB;  Service: Cardiovascular;  Laterality: N/A;  ? TEE WITHOUT CARDIOVERSION N/A 08/30/2020  ? Procedure: TRANSESOPHAGEAL ECHOCARDIOGRAM (TEE);  Surgeon: Acie Fredrickson Wonda Cheng, MD;  Location: [facility];  Service: Cardiovascular;  Laterality: N/A;  ? TEE WITHOUT CARDIOVERSION N/A 10/14/2020  ? Procedure: TRANSESOPHAGEAL ECHOCARDIOGRAM (TEE);  Surgeon: Gaye Pollack, MD;  Location: [facility];  Service: Open Heart Surgery;  Laterality: N/A;  ? ? ?Current Medications: ?Current Meds  ?Medication Sig  ? apixaban (ELIQUIS) 5 MG TABS tablet Take 1 tablet (5 mg total) by mouth 2 (two) times daily.  ? aspirin EC 81 MG tablet Take 1 tablet (81 mg total) by mouth daily. Swallow whole.  ? atorvastatin (LIPITOR) 80 MG tablet Take 1 tablet (80 mg total) by mouth daily.  ? isosorbide mononitrate (IMDUR) 60 MG 24 hr tablet Take 60 mg by mouth daily.  ? latanoprost (XALATAN) 0.005 % ophthalmic solution Place 1 drop into the right eye at bedtime.  ? metFORMIN (GLUCOPHAGE) 1000 MG tablet Take 1,000 mg by mouth 2 (two) times daily with a meal.  ? metoprolol (LOPRESSOR) 50 MG tablet Take 1 tablet (50 mg total) by mouth 2 (two) times daily.  ? terazosin (HYTRIN) 1 MG capsule Take 1 mg by mouth daily.  ? Tiotropium Bromide Monohydrate (SPIRIVA RESPIMAT) 2.5 MCG/ACT AERS Inhale 1 puff into the lungs as needed.  ? valsartan (DIOVAN) 160 MG tablet Take 160 mg by mouth daily.  ? [DISCONTINUED] hydrochlorothiazide (HYDRODIURIL) 25 MG tablet Take 25 mg by mouth daily.  ?  ? ?Allergies:   Cetacaine [butamben-tetracaine-benzocaine]  ? ?Social History  ? ?Socioeconomic History  ? Marital status: Legally Separated  ?  Spouse name: Not on file  ? Number of children: Not on file  ? Years of education: Not on file  ? Highest education level: Not on file  ?Occupational History  ? Not on file  ?Tobacco Use  ? Smoking status: Every Day  ?  Packs/day: 1.00  ?  Years: 40.00  ?  Pack years: 40.00  ?  Types: Cigarettes  ? Smokeless tobacco: Former  ?  Types: Chew  ? Tobacco comments:  ?  half pack daily-05/05/2020-AH  ?Vaping Use  ? Vaping Use: Never used  ?Substance and Sexual Activity  ? Alcohol use: No  ? Drug use: Yes  ?  Types: Marijuana  ?   Comment: 1-2 times per month  ? Sexual activity: Yes  ?Other Topics Concern  ? Not on file  ?Social History Narrative  ? Not on file  ? ?Social Determinants of Health  ? ?Financial Resource Strain: Not on file  ?Food Insecurity: Not on file  ?Transportation Needs: Not on file  ?Physical Activity: Not on file  ?Stress: Not on file  ?Social Connections: Not on file  ?  ? ?Family History: ?The patient's family history includes CAD in his father; Diabetes in his father; Heart failure in his mother; Hypertension in his mother; Prostate cancer in his brother. ? ?ROS:   ?Review of Systems  ?Constitution: Negative for decreased appetite, fever and weight gain.  ?HENT: Negative for congestion, ear discharge, hoarse voice and sore throat.   ?Eyes: Negative for discharge, redness, vision loss in right eye and visual halos.  ?Cardiovascular: Negative for chest pain, dyspnea on exertion, leg swelling, orthopnea and palpitations.  ?Respiratory: Negative for cough, hemoptysis, shortness  of breath and snoring.   ?Endocrine: Negative for heat intolerance and polyphagia.  ?Hematologic/Lymphatic: Negative for bleeding problem. Does not bruise/bleed easily.  ?Skin: Negative for flushing, nail changes, rash and suspicious lesions.  ?Musculoskeletal: Negative for arthritis, joint pain, muscle cramps, myalgias, neck pain and stiffness.  ?Gastrointestinal: Negative for abdominal pain, bowel incontinence, diarrhea and excessive appetite.  ?Genitourinary: Negative for decreased libido, genital sores and incomplete emptying.  ?Neurological: Negative for brief paralysis, focal weakness, headaches and loss of balance.  ?Psychiatric/Behavioral: Negative for altered mental status, depression and suicidal ideas.  ?Allergic/Immunologic: Negative for HIV exposure and persistent infections.  ? ? ?EKGs/Labs/Other Studies Reviewed:   ? ?The following studies were reviewed today: ? ? ?EKG:  None today  ? ? ?TTE 11/28/2020 ?IMPRESSIONS  ? ? ? 1. Left  ventricular ejection fraction, by estimation, is 60 to 65%. The  ?left ventricle has normal function. The left ventricle has no regional  ?wall motion abnormalities. There is severe concentric left ventricular  ?hyper

## 2021-06-14 NOTE — Patient Instructions (Signed)
Medication Instructions:  ?Your physician has recommended you make the following change in your medication:  ?STOP: Hydrochlorothiazide ? ?*If you need a refill on your cardiac medications before your next appointment, please call your pharmacy* ? ? ?Lab Work: ?Your physician recommends that you return for lab work in: ?TODAY: CBC ?If you have labs (blood work) drawn today and your tests are completely normal, you will receive your results only by: ?MyChart Message (if you have MyChart) OR ?A paper copy in the mail ?If you have any lab test that is abnormal or we need to change your treatment, we will call you to review the results. ? ? ?Testing/Procedures: ?None ? ? ?Follow-Up: ?At [facility], you and your health needs are our priority.  As part of our continuing mission to provide you with exceptional heart care, we have created designated Provider Care Teams.  These Care Teams include your primary Cardiologist (physician) and Advanced Practice Providers (APPs -  Physician Assistants and Nurse Practitioners) who all work together to provide you with the care you need, when you need it. ? ?We recommend signing up for the patient portal called "MyChart".  Sign up information is provided on this After Visit Summary.  MyChart is used to connect with patients for Virtual Visits (Telemedicine).  Patients are able to view lab/test results, encounter notes, upcoming appointments, etc.  Non-urgent messages can be sent to your provider as well.   ?To learn more about what you can do with MyChart, go to [email or web address].   ? ?Your next appointment:   ?9 month(s) ? ?The format for your next appointment:   ?In Person ? ?Provider:   ?Thomasene Ripple, DO   ? ? ?Other Instructions ? ? ?Important Information About Sugar ? ? ? ? ?  ?

## 2021-09-19 ENCOUNTER — Inpatient Hospital Stay: Payer: Medicaid Other

## 2021-09-19 ENCOUNTER — Inpatient Hospital Stay: Payer: Medicaid Other | Attending: Oncology | Admitting: Oncology

## 2021-09-19 ENCOUNTER — Encounter: Payer: Self-pay | Admitting: Oncology

## 2021-09-19 VITALS — BP 111/73 | HR 72 | Temp 97.7°F | Resp 17 | Ht 70.0 in | Wt 195.6 lb

## 2021-09-19 DIAGNOSIS — D500 Iron deficiency anemia secondary to blood loss (chronic): Secondary | ICD-10-CM | POA: Diagnosis not present

## 2021-09-19 DIAGNOSIS — D509 Iron deficiency anemia, unspecified: Secondary | ICD-10-CM | POA: Diagnosis present

## 2021-09-19 LAB — CBC AND DIFFERENTIAL
HCT: 40 — AB (ref 41–53)
Hemoglobin: 12.8 — AB (ref 13.5–17.5)
Neutrophils Absolute: 5.5
Platelets: 210 10*3/uL (ref 150–400)
WBC: 11.7

## 2021-09-19 LAB — IRON AND TIBC
Iron: 27 ug/dL — ABNORMAL LOW (ref 45–182)
Saturation Ratios: 6 % — ABNORMAL LOW (ref 17.9–39.5)
TIBC: 420 ug/dL (ref 250–450)
UIBC: 393 ug/dL

## 2021-09-19 LAB — BASIC METABOLIC PANEL
BUN: 27 — AB (ref 4–21)
CO2: 28 — AB (ref 13–22)
Chloride: 105 (ref 99–108)
Creatinine: 1.1 (ref 0.6–1.3)
Glucose: 126
Potassium: 4.1 mEq/L (ref 3.5–5.1)
Sodium: 140 (ref 137–147)

## 2021-09-19 LAB — CBC: RBC: 5.04 (ref 3.87–5.11)

## 2021-09-19 LAB — HEPATIC FUNCTION PANEL
ALT: 23 U/L (ref 10–40)
AST: 32 (ref 14–40)
Alkaline Phosphatase: 78 (ref 25–125)
Bilirubin, Total: 0.6

## 2021-09-19 LAB — COMPREHENSIVE METABOLIC PANEL
Albumin: 4.2 (ref 3.5–5.0)
Calcium: 9.5 (ref 8.7–10.7)

## 2021-09-19 LAB — FERRITIN: Ferritin: 6 ng/mL — ABNORMAL LOW (ref 24–336)

## 2021-09-19 NOTE — Progress Notes (Signed)
[facility]  [address] [place],  [place]  [address] [phone]  Clinic Day:  09/19/21  Referring physician: Ruben Im, PA-C   CHIEF COMPLAINT:  CC:   Recurrent iron deficiency anemia  Current Treatment:   IV iron replacement with Feraheme   HISTORY OF PRESENT ILLNESS:  Vincent Guerrero is a 61 y.o. male with a history of recurrent severe iron deficiency anemia despite oral iron supplementation. He was seen by Dr. Alvy Bimler on June 1st and his hemoglobin was 9.1 at that time. Iron studies were consistent with iron deficiency.  B12 was normal.  TSH was normal.  Vitamin-D was mildly low.  Review of his CBCs dating back to April 2018, revealed his hemoglobin ranged from 7.3-12.5, always with low MCV. Last year, he presented with severe anemia requiring blood transfusion. He underwent EGD and colonoscopy in October 2021. EGD revealed non-bleeding gastric ulcers.  He continued taking aspirin-containing products and using NSAIDs, so was instructed to stop these. The patient was symptomatic from anemia and the iron level was critically low. He was given IV iron in the form of Feraheme with a goal to correct his anemia and iron deficiency and avoid transfusion support. When he was seen in July, his hemoglobin was up to 11.9. Folate, LDH and haptoglobin were all normal. Serum protein electrophoresis was negative for monoclonal spike. He was admitted on September 9th for severe aortic stenosis and had aortic valve replacement and coronary artery bypass grafting. Hemoglobin prior to surgery was 9.2, and down to 8.1 at the lowest following the procedure. He was discharged on September 15th on amiodarone due to atrial fibrillation and was advised to take aspirin 325 mg daily.  INTERVAL HISTORY:  Vincent Guerrero is here for routine follow up and states he is feeling tired. He continues taking Eliquis at 5 mg daily after loading dose. He denies any problems with his  heart. He saw his family doctor 3 weeks ago and continues to see he regularly every 3 months. His  appetite is good, and he has lost 1 pound since his last visit.  His hemoglobin last visit was at 8.8 and he received IV Feraheme in August, today it is at 12.8. He does state he will be quitting smoking. He does report coughing and clear phelgm. Last year he was found to have peptic ulcer disease as the cause of his blood loss. He denies fever, chills or other signs of infection.  He denies nausea, vomiting, bowel issues, he denies melena  or abdominal pain.  He denies sore throat, cough, dyspnea, or chest pain.  REVIEW OF SYSTEMS:  Review of Systems  Constitutional:  Positive for fatigue (and generalized weakness). Negative for appetite change, chills, fever and unexpected weight change.  HENT:   Positive for mouth sores (intermittent soreness of the mouth).   Eyes: Negative.   Respiratory:  Positive for cough (productive with white sputum). Negative for chest tightness, hemoptysis, shortness of breath and wheezing.   Cardiovascular: Negative.  Negative for chest pain, leg swelling and palpitations.  Gastrointestinal: Negative.  Negative for abdominal distention, abdominal pain, blood in stool, constipation, diarrhea, nausea and vomiting.  Endocrine: Negative.   Genitourinary: Negative.  Negative for difficulty urinating, dysuria, frequency and hematuria.   Musculoskeletal:  Positive for arthralgias. Negative for back pain, flank pain, gait problem and myalgias.  Skin: Negative.   Neurological:  Positive for headaches (occasional). Negative for dizziness, extremity weakness, gait problem, light-headedness, numbness, seizures and speech  difficulty.  Hematological: Negative.   Psychiatric/Behavioral: Negative.  Negative for depression and sleep disturbance. The patient is not nervous/anxious.   All other systems reviewed and are negative.    VITALS:  Blood pressure 111/73, pulse 72, temperature  97.7 F (36.5 C), temperature source Oral, resp. rate 17, height 5\' 10"  (1.778 m), weight 195 lb 9.6 oz (88.7 kg), SpO2 98 %.  Wt Readings from Last 3 Encounters:  09/19/21 195 lb 9.6 oz (88.7 kg)  06/14/21 196 lb 9.6 oz (89.2 kg)  01/05/21 185 lb (83.9 kg)    Body mass index is 28.07 kg/m.  Performance status (ECOG): 1 - Symptomatic but completely ambulatory  PHYSICAL EXAM:  Physical Exam Constitutional:      General: He is not in acute distress.    Appearance: Normal appearance. He is normal weight.  HENT:     Head: Normocephalic and atraumatic.  Eyes:     General: No scleral icterus.    Extraocular Movements: Extraocular movements intact.     Conjunctiva/sclera: Conjunctivae normal.     Pupils: Pupils are equal, round, and reactive to light.  Cardiovascular:     Rate and Rhythm: Normal rate and regular rhythm.     Pulses: Normal pulses.     Heart sounds: No murmur heard.    No friction rub. No gallop.     Comments: Mid systolic thump, likely from aortic valve Pulmonary:     Effort: Pulmonary effort is normal. No respiratory distress.     Breath sounds: Wheezing present.     Comments: Little wheezing Chest:     Comments: Large sternal scar which is healing well but does have ecchymosis. Abdominal:     General: Bowel sounds are normal. There is no distension.     Palpations: Abdomen is soft. There is no hepatomegaly, splenomegaly or mass.     Tenderness: There is no abdominal tenderness.  Musculoskeletal:        General: Normal range of motion.     Cervical back: Normal range of motion and neck supple.     Right lower leg: 1+ Edema present.     Left lower leg: No edema.  Lymphadenopathy:     Cervical: No cervical adenopathy.  Skin:    General: Skin is warm and dry.  Neurological:     General: No focal deficit present.     Mental Status: He is alert and oriented to person, place, and time. Mental status is at baseline.  Psychiatric:        Mood and Affect: Mood  normal.        Behavior: Behavior normal.        Thought Content: Thought content normal.        Judgment: Judgment normal.    LABS:      Latest Ref Rng & Units 09/19/2021   12:00 AM 06/14/2021   11:22 AM 12/19/2020   12:00 AM  CBC  WBC  11.7     13.2  14.0   Hemoglobin 13.5 - 17.5 12.8     13.1  13.5   Hematocrit 41 - 53 40     40.9  42   Platelets 150 - 400 K/uL 210     227  219      This result is from an external source.      Latest Ref Rng & Units 09/19/2021   12:00 AM 12/19/2020   12:00 AM 11/02/2020   12:00 AM  CMP  BUN 4 - 21  27     41  21   Creatinine 0.6 - 1.3 1.1     1.2  1.3   Sodium 137 - 147 140     138  138   Potassium 3.5 - 5.1 mEq/L 4.1     3.7  4.5   Chloride 99 - 108 105     101  102   CO2 13 - 22 28     26  25    Calcium 8.7 - 10.7 9.5     9.3  9.4   Alkaline Phos 25 - 125 78     120  108   AST 14 - 40 32     47  27   ALT 10 - 40 U/L 23     60  15      This result is from an external source.      Lab Results  Component Value Date   TOTALPROTELP 6.3 09/01/2020   ALBUMINELP 3.7 09/01/2020   A1GS 0.2 09/01/2020   A2GS 0.7 09/01/2020   BETS 0.9 09/01/2020   GAMS 0.7 09/01/2020   MSPIKE Not Observed 09/01/2020   Lab Results  Component Value Date   TIBC 420 09/19/2021   TIBC 346 12/19/2020   TIBC 323 11/02/2020   FERRITIN 6 (L) 09/19/2021   FERRITIN 195 12/19/2020   FERRITIN 52 11/02/2020   IRONPCTSAT 6 (L) 09/19/2021   IRONPCTSAT 17 (L) 12/19/2020   IRONPCTSAT 6 (L) 11/02/2020   Lab Results  Component Value Date   LDH 137 09/01/2020    STUDIES:  No results found.   HISTORY:   Allergies:  Allergies  Allergen Reactions   Cetacaine [Butamben-Tetracaine-Benzocaine] Shortness Of Breath and Other (See Comments)    Intense laryngospasm    Current Medications: Current Outpatient Medications  Medication Sig Dispense Refill   apixaban (ELIQUIS) 5 MG TABS tablet Take 1 tablet (5 mg total) by mouth 2 (two) times daily. 60 tablet 5    atorvastatin (LIPITOR) 80 MG tablet Take 1 tablet (80 mg total) by mouth daily. 30 tablet 1   hydrochlorothiazide (HYDRODIURIL) 25 MG tablet Take 25 mg by mouth daily.     isosorbide mononitrate (IMDUR) 60 MG 24 hr tablet Take 60 mg by mouth daily.     JARDIANCE 10 MG TABS tablet Take 10 mg by mouth daily.     meloxicam (MOBIC) 7.5 MG tablet Take 7.5 mg by mouth daily.     metFORMIN (GLUCOPHAGE) 1000 MG tablet Take 1,000 mg by mouth 2 (two) times daily with a meal.     metoprolol (LOPRESSOR) 50 MG tablet Take 1 tablet (50 mg total) by mouth 2 (two) times daily. 180 tablet 2   terazosin (HYTRIN) 1 MG capsule Take 1 mg by mouth daily.     valsartan (DIOVAN) 160 MG tablet Take 160 mg by mouth daily.     No current facility-administered medications for this visit.     ASSESSMENT & PLAN:   Assessment: 1. Iron deficiency anemia, felt to be due to chronic GI blood loss and malabsorption.  He had good improvement in his iron stores with IV Feraheme in June of 2022.   2. Severe aortic stenosis requiring aortic valve replacement and coronary artery bypass grafting on September 9th.   3. Three left lower lobe pulmonary emboli, 1 segmental and 2 subsegmental, September 2022. He has been placed on Eliquis starting at 10 mg BID to then be decreased to 5 mg daily.   4. Atrial  fibrillation, resolved. He continues amiodarone 400 mg daily.  5. Vitamin-D insufficiency, he is on vitamin D3 1000 international units daily.  Plan:   His hemoglobin is improved after the IV iron and so I see no need for continued follow up so we will plan to see him as needed. His physician sees him regularly and can call us if his blood counts drop again.The patient understands the plans discussed today and is in agreement with them.  He knows to contact our office if he develops symptoms of worsening anemia or other concerns prior to his next appointment.    I,Gabriella Ballesteros,acting as a scribe for Dellia Beckwith, MD.,have documented all relevant documentation on the behalf of Dellia Beckwith, MD,as directed by  Dellia Beckwith, MD while in the presence of Dellia Beckwith, MD.   I have reviewed this report as typed by the medical scribe, and it is complete and accurate.

## 2021-10-03 ENCOUNTER — Encounter: Payer: Self-pay | Admitting: Cardiology

## 2021-10-11 ENCOUNTER — Encounter: Payer: Self-pay | Admitting: Oncology

## 2021-10-12 ENCOUNTER — Telehealth: Payer: Self-pay

## 2021-10-12 NOTE — Telephone Encounter (Signed)
-----   Message from Dellia Beckwith, MD sent at 10/11/2021  7:58 PM EDT ----- Regarding: call Tell him the iron tests are still  fairly low even though his hgb was better. He will prob need IV iron again within the next 6 months, let us know. I did not schedule f/u

## 2021-10-13 ENCOUNTER — Encounter: Payer: Self-pay | Admitting: Oncology

## 2021-10-31 ENCOUNTER — Telehealth: Payer: Self-pay | Admitting: Cardiology

## 2021-10-31 NOTE — Telephone Encounter (Signed)
   Pre-operative Risk Assessment    Patient Name: Vincent Guerrero  DOB: 26-Jul-1960 MRN: [id number]      Request for Surgical Clearance    Procedure:    left knee arthroscopy with meniscectomy   Date of Surgery:  Clearance 12/05/21                                 Surgeon:  Dr. Hart Rochester  Surgeon's Group or Practice Name:  [facility] Phone number:  [phone] Fax number:  [phone]   Type of Clearance Requested:   - Medical  - Pharmacy:  Hold Clopidogrel (Plavix) 7 day prior    Type of Anesthesia:  General    Additional requests/questions:    Dorthey Sawyer   10/31/2021, 10:48 AM

## 2021-10-31 NOTE — Telephone Encounter (Signed)
   Name: Vincent Guerrero  DOB: [birth year]  MRN: [id number]  Primary Cardiologist: Berniece Salines, DO  Chart reviewed as part of pre-operative protocol coverage. Because of [name]'s past medical history and time since last visit, he will require a follow-up virtual visit in order to better assess preoperative cardiovascular risk.  Pre-op covering staff: - Please schedule appointment and call patient to inform them. If patient already had an upcoming appointment within acceptable timeframe, please add "pre-op clearance" to the appointment notes so provider is aware. - Please contact requesting surgeon's office via preferred method (i.e, phone, fax) to inform them of need for appointment prior to surgery.  Loel Dubonnet, NP  10/31/2021, 5:45 PM

## 2021-11-01 NOTE — Telephone Encounter (Signed)
I have attempted to contact this patient by phone, but patient doesn't have VM set.  I will continue to try later.

## 2021-11-06 ENCOUNTER — Telehealth: Payer: Self-pay | Admitting: *Deleted

## 2021-11-06 NOTE — Telephone Encounter (Signed)
Pt has been scheduled for a tele visit, 11/09/21 9:00.  Consent on file.  Pt didn't have his list of medications with him and didn't know them by heart.  He will call back between now and Thursday to go over.  I will send this encounter to [facility] so we can confirm medications when pt calls.     Patient Consent for Virtual Visit        Vincent Guerrero has provided verbal consent on 11/06/2021 for a virtual visit (video or telephone).   CONSENT FOR VIRTUAL VISIT FOR:  Vincent Guerrero  By participating in this virtual visit I agree to the following:  I hereby voluntarily request, consent and authorize [facility] and its employed or contracted physicians, physician assistants, nurse practitioners or other licensed health care professionals (the Practitioner), to provide me with telemedicine health care services (the "Services") as deemed necessary by the treating Practitioner. I acknowledge and consent to receive the Services by the Practitioner via telemedicine. I understand that the telemedicine visit will involve communicating with the Practitioner through live audiovisual communication technology and the disclosure of certain medical information by electronic transmission. I acknowledge that I have been given the opportunity to request an in-person assessment or other available alternative prior to the telemedicine visit and am voluntarily participating in the telemedicine visit.  I understand that I have the right to withhold or withdraw my consent to the use of telemedicine in the course of my care at any time, without affecting my right to future care or treatment, and that the Practitioner or I may terminate the telemedicine visit at any time. I understand that I have the right to inspect all information obtained and/or recorded in the course of the telemedicine visit and may receive copies of available information for a reasonable fee.  I understand that some of the  potential risks of receiving the Services via telemedicine include:  Delay or interruption in medical evaluation due to technological equipment failure or disruption; Information transmitted may not be sufficient (e.g. poor resolution of images) to allow for appropriate medical decision making by the Practitioner; and/or  In rare instances, security protocols could fail, causing a breach of personal health information.  Furthermore, I acknowledge that it is my responsibility to provide information about my medical history, conditions and care that is complete and accurate to the best of my ability. I acknowledge that Practitioner's advice, recommendations, and/or decision may be based on factors not within their control, such as incomplete or inaccurate data provided by me or distortions of diagnostic images or specimens that may result from electronic transmissions. I understand that the practice of medicine is not an exact science and that Practitioner makes no warranties or guarantees regarding treatment outcomes. I acknowledge that a copy of this consent can be made available to me via my patient portal ([facility]), or I can request a printed copy by calling the office of [facility].    I understand that my insurance will be billed for this visit.   I have read or had this consent read to me. I understand the contents of this consent, which adequately explains the benefits and risks of the Services being provided via telemedicine.  I have been provided ample opportunity to ask questions regarding this consent and the Services and have had my questions answered to my satisfaction. I give my informed consent for the services to be provided through the use of telemedicine in my  medical care

## 2021-11-06 NOTE — Telephone Encounter (Signed)
Pt has been scheduled for a tele visit, 11/09/21 9:00.  Consent on file.  Pt will call back to go over medication list.

## 2021-11-07 NOTE — Telephone Encounter (Signed)
Called the pt to go over his med rec for pre op tele appt 11/09/21. Pt said he is not home right now. Pt asked if he could just could over the medications with the pre op provider 11/09/21. I said that will be fine. Pt thanked me for the help today.

## 2021-11-09 ENCOUNTER — Telehealth: Payer: Self-pay | Admitting: Pharmacist Clinician (PhC)/ Clinical Pharmacy Specialist

## 2021-11-09 ENCOUNTER — Ambulatory Visit (INDEPENDENT_AMBULATORY_CARE_PROVIDER_SITE_OTHER): Payer: Medicaid Other | Admitting: General Practice

## 2021-11-09 ENCOUNTER — Telehealth: Payer: Self-pay | Admitting: *Deleted

## 2021-11-09 ENCOUNTER — Telehealth: Payer: Self-pay | Admitting: General Practice

## 2021-11-09 DIAGNOSIS — Z01810 Encounter for preprocedural cardiovascular examination: Secondary | ICD-10-CM

## 2021-11-09 NOTE — Progress Notes (Signed)
Unable to contact patient.  Attempted x4.

## 2021-11-09 NOTE — Telephone Encounter (Signed)
Patient with diagnosis of atrial fibrillation on Eliquis for anticoagulation.    Procedure: left knee arthroscopy with meniscetomy Date of procedure: 12/05/21   CHA2DS2-VASc Score = 5   This indicates a 7.2% annual risk of stroke. The patient's score is based upon: CHF History: 0 HTN History: 1 Diabetes History: 1 Stroke History: 2 Vascular Disease History: 1 Age Score: 0 Gender Score: 0   Per chart stroke was in 2011, no other information found.  Had provoked PE after AV replacement 10/2020 with 3 left lower pulmonary emboli  CrCl 90 Platelet count 227  Per office protocol, patient can hold Eliquis for 1 days prior to procedure.   Reviewed chart with Dr. Harriet Masson.  If surgeon wants > 1 day, will need to bridge with Lovenox due to high risk.   PLEASE RESUME ELIQUIS AT FULL DOSE AS SOON AS POSSIBLE.  **This guidance is not considered finalized until pre-operative APP has relayed final recommendations.**

## 2021-11-09 NOTE — Telephone Encounter (Signed)
Pt missed his tele pre op appt for today. Pt called back and has been rescheduled to 11/24/21 @ 2:00 pm. Med rec and consent are done.     Patient Consent for Virtual Visit        Vincent Guerrero has provided verbal consent on 11/09/2021 for a virtual visit (video or telephone).   CONSENT FOR VIRTUAL VISIT FOR:  Vincent Guerrero  By participating in this virtual visit I agree to the following:  I hereby voluntarily request, consent and authorize [facility] and its employed or contracted physicians, physician assistants, nurse practitioners or other licensed health care professionals (the Practitioner), to provide me with telemedicine health care services (the "Services") as deemed necessary by the treating Practitioner. I acknowledge and consent to receive the Services by the Practitioner via telemedicine. I understand that the telemedicine visit will involve communicating with the Practitioner through live audiovisual communication technology and the disclosure of certain medical information by electronic transmission. I acknowledge that I have been given the opportunity to request an in-person assessment or other available alternative prior to the telemedicine visit and am voluntarily participating in the telemedicine visit.  I understand that I have the right to withhold or withdraw my consent to the use of telemedicine in the course of my care at any time, without affecting my right to future care or treatment, and that the Practitioner or I may terminate the telemedicine visit at any time. I understand that I have the right to inspect all information obtained and/or recorded in the course of the telemedicine visit and may receive copies of available information for a reasonable fee.  I understand that some of the potential risks of receiving the Services via telemedicine include:  Delay or interruption in medical evaluation due to technological equipment failure or  disruption; Information transmitted may not be sufficient (e.g. poor resolution of images) to allow for appropriate medical decision making by the Practitioner; and/or  In rare instances, security protocols could fail, causing a breach of personal health information.  Furthermore, I acknowledge that it is my responsibility to provide information about my medical history, conditions and care that is complete and accurate to the best of my ability. I acknowledge that Practitioner's advice, recommendations, and/or decision may be based on factors not within their control, such as incomplete or inaccurate data provided by me or distortions of diagnostic images or specimens that may result from electronic transmissions. I understand that the practice of medicine is not an exact science and that Practitioner makes no warranties or guarantees regarding treatment outcomes. I acknowledge that a copy of this consent can be made available to me via my patient portal ([facility]), or I can request a printed copy by calling the office of [facility].    I understand that my insurance will be billed for this visit.   I have read or had this consent read to me. I understand the contents of this consent, which adequately explains the benefits and risks of the Services being provided via telemedicine.  I have been provided ample opportunity to ask questions regarding this consent and the Services and have had my questions answered to my satisfaction. I give my informed consent for the services to be provided through the use of telemedicine in my medical care

## 2021-11-09 NOTE — Telephone Encounter (Signed)
Attempted to contact patient for preoperative telephone visit.  Unable to reach patient attempted to contact x3.  I will attempt to contact patient 1 more time and then patient will need to reschedule.  Addendum-Preop assist also try to contact patient..  Patient will need to reschedule. 11/09/21 at [facility] Marquesa Rath NP-C     11/09/2021, 9:11 AM [address] [address] [phone] Fax [phone]

## 2021-11-09 NOTE — Telephone Encounter (Signed)
Pt missed his tele pre op appt for today. Pt called back and has been rescheduled to 11/24/21 @ 2:00 pm. Med rec and consent are done.

## 2021-11-24 ENCOUNTER — Ambulatory Visit: Payer: Medicaid Other | Attending: Cardiovascular Disease | Admitting: General Practice

## 2021-11-24 DIAGNOSIS — Z01810 Encounter for preprocedural cardiovascular examination: Secondary | ICD-10-CM

## 2021-11-24 NOTE — Progress Notes (Signed)
CLEARANCE NOTES HAVE BEEN FAXED TO DR. Hart Rochester III; FAX # [phone]

## 2021-11-24 NOTE — Progress Notes (Signed)
Virtual Visit via Telephone Note   Because of Vincent Guerrero's co-morbid illnesses, he is at least at moderate risk for complications without adequate follow up.  This format is felt to be most appropriate for this patient at this time.  The patient did not have access to video technology/had technical difficulties with video requiring transitioning to audio format only (telephone).  All issues noted in this document were discussed and addressed.  No physical exam could be performed with this format.  Please refer to the patient's chart for his consent to telehealth for [facility].  Evaluation Performed:  Preoperative cardiovascular risk assessment _____________   Date:  11/24/2021   Patient ID:  Vincent Guerrero, DOB [birth year], MRN [id number] Patient Location:  Home Provider location:   Office  Primary Care Provider:  Dalbert Mayotte, PA-C Primary Cardiologist:  Thomasene Ripple, DO  Chief Complaint / Patient Profile   61 y.o. y/o male with a h/o aortic valve replacement, HTN, CAD, HLD who is pending Left knee arthroscopy w/ meniscectomy and presents today for telephonic preoperative cardiovascular risk assessment.  Past Medical History    Past Medical History:  Diagnosis Date   Age-related nuclear cataract of both eyes 11/27/2018   Anemia    Bilateral recurrent inguinal hernia without obstruction or gangrene 05/09/2016   Added automatically from request for surgery [id number]   Coronary artery disease involving native coronary artery without angina pectoris 05/2016   History of PCI to nondominant RCA. ->  Cath in April 2018 showed occluded nondominant RCA stent.  30% LM.  pLAD 40%.  Ostial RI 60%.   Cortical age-related cataract of both eyes 11/27/2018   Depression    Diabetes mellitus (HCC)    Hyperlipidemia LDL goal <70    Hypertension    Inguinal hernia without obstruction or gangrene    BILATERAL   Primary open angle glaucoma (POAG) of both eyes, severe  stage 11/27/2018   Primary open angle glaucoma (POAG) of left eye, severe stage 11/27/2018   Stroke (HCC)    Tobacco abuse 11/05/2019   Tobacco use 11/06/2019   Past Surgical History:  Procedure Laterality Date   AORTIC VALVE REPLACEMENT N/A 10/14/2020   Procedure: AORTIC VALVE REPLACEMENT (AVR) USING INSPIRIS RESILIA AORTIC VALVE;  Surgeon: Alleen Borne, MD;  [facility];  Service: Open Heart Surgery;  Laterality: N/A;   BIOPSY  11/27/2019   Procedure: BIOPSY;  Surgeon: Sherrilyn Rist, MD;  Location: [facility] ENDOSCOPY;  Service: Gastroenterology;;   COLONOSCOPY WITH PROPOFOL N/A 11/27/2019   Procedure: COLONOSCOPY WITH PROPOFOL;  Surgeon: Sherrilyn Rist, MD;  Location: [facility] ENDOSCOPY;  Service: Gastroenterology;  Laterality: N/A;   CORONARY ARTERY BYPASS GRAFT N/A 10/14/2020   Procedure: CORONARY ARTERY BYPASS GRAFTING (CABG) TIMES FOUR, USING LEFT INTERNAL MAMMARY ARTERY AND RIGHT GREATER SAPHENOUS VEIN HARVESTED ENDOSCOPICALLY;  Surgeon: Alleen Borne, MD;  [facility] OR;  Service: Open Heart Surgery;  Laterality: N/A;   ESOPHAGOGASTRODUODENOSCOPY (EGD) WITH PROPOFOL N/A 11/27/2019   Procedure: ESOPHAGOGASTRODUODENOSCOPY (EGD) WITH PROPOFOL;  Surgeon: Sherrilyn Rist, MD;  Location: [facility] ENDOSCOPY;  Service: Gastroenterology;  Laterality: N/A;   HEMOSTASIS CLIP PLACEMENT  11/27/2019   Procedure: HEMOSTASIS CLIP PLACEMENT;  Surgeon: Sherrilyn Rist, MD;  [facility] ENDOSCOPY;  Service: Gastroenterology;;   HERNIA REPAIR  1998   INTRAVASCULAR ULTRASOUND/IVUS N/A 09/12/2020   Procedure: Intravascular Ultrasound/IVUS;  Surgeon: Marykay Lex, MD;  Location: [facility] INVASIVE CV LAB;  Service: Cardiovascular;  Laterality: N/A;   LEFT HEART CATH AND CORONARY ANGIOGRAPHY N/A 06/04/2016   Procedure: Left Heart Cath and Coronary Angiography;  Surgeon: Belva Crome, MD;  Location: [place] CV LAB;  Service: Cardiovascular;  Laterality: N/A;   RIGHT/LEFT HEART CATH AND CORONARY  ANGIOGRAPHY N/A 09/12/2020   Procedure: RIGHT/LEFT HEART CATH AND CORONARY ANGIOGRAPHY;  Surgeon: Leonie Man, MD;  Location: [place] CV LAB;  Service: Cardiovascular;  Laterality: N/A;   TEE WITHOUT CARDIOVERSION N/A 08/30/2020   Procedure: TRANSESOPHAGEAL ECHOCARDIOGRAM (TEE);  Surgeon: Acie Fredrickson Wonda Cheng, MD;  Location: [place];  Service: Cardiovascular;  Laterality: N/A;   TEE WITHOUT CARDIOVERSION N/A 10/14/2020   Procedure: TRANSESOPHAGEAL ECHOCARDIOGRAM (TEE);  Surgeon: Gaye Pollack, MD;  Location: [place];  Service: Open Heart Surgery;  Laterality: N/A;    Allergies  Allergies  Allergen Reactions   Cetacaine [Butamben-Tetracaine-Benzocaine] Shortness Of Breath and Other (See Comments)    Intense laryngospasm    History of Present Illness    Vincent Guerrero is a 61 y.o. male who presents via audio/video conferencing for a telehealth visit today.  Pt was last seen in cardiology clinic on 06/14/21 by Dr. Harriet Masson.  At that time Vincent Guerrero was doing well .  The patient is now pending procedure as outlined above. Since his last visit, he  remains stable from a cardiac standpoint.  Today he denies chest pain, shortness of breath, lower extremity edema, fatigue, palpitations, melena, hematuria, hemoptysis, diaphoresis, weakness, presyncope, syncope, orthopnea, and PND.    Home Medications    Prior to Admission medications   Medication Sig Start Date End Date Taking? Authorizing Provider  apixaban (ELIQUIS) 5 MG TABS tablet Take 1 tablet (5 mg total) by mouth 2 (two) times daily. 11/15/20   Tobb, Kardie, DO  atorvastatin (LIPITOR) 80 MG tablet Take 1 tablet (80 mg total) by mouth daily. 12/19/20   Barrett, Erin R, PA-C  hydrochlorothiazide (HYDRODIURIL) 25 MG tablet Take 25 mg by mouth daily. 09/15/21   [provider]  isosorbide mononitrate (IMDUR) 60 MG 24 hr tablet Take 60 mg by mouth daily.    [provider]  JARDIANCE 10 MG TABS tablet Take 10  mg by mouth daily. 09/15/21   [provider]  meloxicam (MOBIC) 7.5 MG tablet Take 7.5 mg by mouth daily. 09/06/21   [provider]  metFORMIN (GLUCOPHAGE) 1000 MG tablet Take 1,000 mg by mouth 2 (two) times daily with a meal.    [provider]  metoprolol (LOPRESSOR) 50 MG tablet Take 1 tablet (50 mg total) by mouth 2 (two) times daily. 05/11/16   Eileen Stanford, PA-C  terazosin (HYTRIN) 1 MG capsule Take 1 mg by mouth daily. 12/15/20   [provider]  valsartan (DIOVAN) 160 MG tablet Take 160 mg by mouth daily.    Ruben Im, [place]    Physical Exam    Vital Signs:  Vincent Guerrero does not have vital signs available for review today.  Given telephonic nature of communication, physical exam is limited. AAOx3. NAD. Normal affect.  Speech and respirations are unlabored.  Accessory Clinical Findings    None  Assessment & Plan    1.  Preoperative Cardiovascular Risk Assessment: Left knee arthroscopy w/ meniscectomy, Dr. Laurance Flatten, [facility]    Primary Cardiologist: Berniece Salines, DO  Chart reviewed as part of pre-operative protocol coverage. Given past medical history and time since last visit, based on ACC/AHA guidelines, Vincent Guerrero would  be at acceptable risk for the planned procedure without further cardiovascular testing.   Patient was advised that if he develops new symptoms prior to surgery to contact our office to arrange a follow-up appointment.  He verbalized understanding.  I will route this recommendation to the requesting party via Epic fax function and remove from pre-op pool.   Patient with diagnosis of atrial fibrillation on Eliquis for anticoagulation.     Procedure: left knee arthroscopy with meniscetomy Date of procedure: 12/05/21     CHA2DS2-VASc Score = 5   This indicates a 7.2% annual risk of stroke. The patient's score is based upon: CHF History: 0 HTN History: 1 Diabetes History:  1 Stroke History: 2 Vascular Disease History: 1 Age Score: 0 Gender Score: 0   Per chart stroke was in 2011, no other information found.  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   Reviewed chart with Dr. Harriet Masson.  If surgeon wants > 1 day, will need to bridge with Lovenox due to high risk.    PLEASE RESUME ELIQUIS AT FULL DOSE AS SOON AS POSSIBLE.   Time:   Today, I have spent 5 minutes with the patient with telehealth technology discussing medical history, symptoms, and management plan.  Prior to his phone evaluation has been greater than 10 minutes reviewing his past medical history and cardiac medications.   Deberah Pelton, NP  11/24/2021, 8:06 AM

## 2022-08-13 DIAGNOSIS — I2510 Atherosclerotic heart disease of native coronary artery without angina pectoris: Secondary | ICD-10-CM

## 2022-08-13 DIAGNOSIS — R079 Chest pain, unspecified: Secondary | ICD-10-CM

## 2022-08-13 IMAGING — DX DG CHEST 1V PORT
1 series · 1 of 1 positions shown · non-contrast
Comparison: Prior chest radiographs 10/30/2019.

CLINICAL DATA: Fatigue.

EXAM:
PORTABLE CHEST 1 VIEW

[chest]
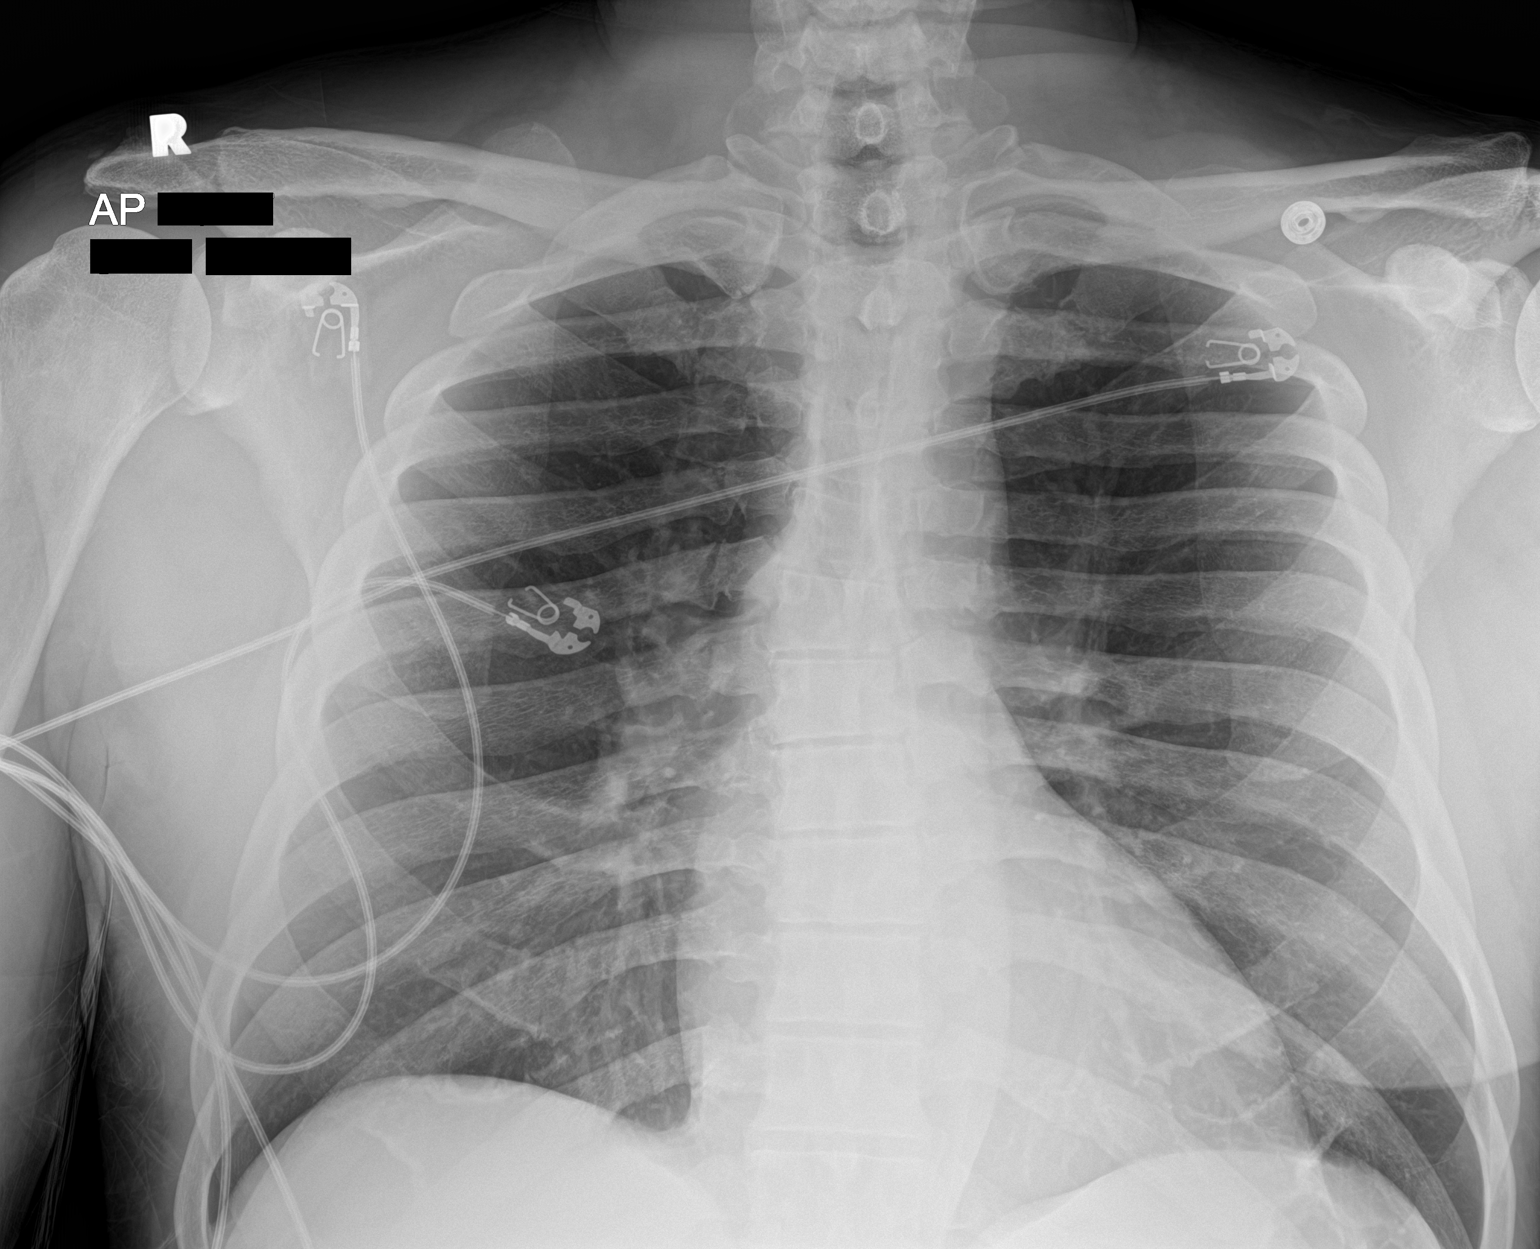

[1 of 1 positions shown; findings below may reference images not displayed]

FINDINGS: Please note portions of the lateral costophrenic angles are excluded
from the field of view bilaterally.

Heart size within normal limits. No appreciable airspace
consolidation or pulmonary edema. No evidence of pleural effusion or
pneumothorax. No acute bony abnormality identified.
IMPRESSION: Portions of the lateral costophrenic angles are excluded from the
field of view bilaterally.

No evidence of active cardiopulmonary disease.

## 2023-02-15 ENCOUNTER — Inpatient Hospital Stay: Payer: Medicaid Other | Admitting: Oncology

## 2023-02-15 ENCOUNTER — Inpatient Hospital Stay: Payer: Medicaid Other

## 2023-03-05 ENCOUNTER — Inpatient Hospital Stay: Payer: Medicaid Other | Admitting: Oncology

## 2023-03-05 ENCOUNTER — Inpatient Hospital Stay: Payer: Medicaid Other

## 2023-03-12 ENCOUNTER — Inpatient Hospital Stay: Payer: Medicaid Other | Admitting: Oncology

## 2023-03-12 ENCOUNTER — Encounter: Payer: Self-pay | Admitting: Oncology

## 2023-03-12 ENCOUNTER — Inpatient Hospital Stay: Payer: Medicaid Other | Attending: Oncology

## 2023-03-12 ENCOUNTER — Other Ambulatory Visit: Payer: Self-pay | Admitting: Oncology

## 2023-03-12 VITALS — BP 87/50 | HR 86 | Temp 98.6°F | Resp 16 | Ht 70.0 in | Wt 172.5 lb

## 2023-03-12 DIAGNOSIS — D500 Iron deficiency anemia secondary to blood loss (chronic): Secondary | ICD-10-CM

## 2023-03-12 DIAGNOSIS — D509 Iron deficiency anemia, unspecified: Secondary | ICD-10-CM

## 2023-03-12 LAB — CBC WITH DIFFERENTIAL (CANCER CENTER ONLY)
Abs Immature Granulocytes: 0 10*3/uL (ref 0.00–0.07)
Basophils Absolute: 0 10*3/uL (ref 0.0–0.1)
Basophils Relative: 0 %
Eosinophils Absolute: 0.1 10*3/uL (ref 0.0–0.5)
Eosinophils Relative: 1 %
HCT: 29.3 % — ABNORMAL LOW (ref 39.0–52.0)
Hemoglobin: 8.8 g/dL — ABNORMAL LOW (ref 13.0–17.0)
Immature Granulocytes: 0 %
Lymphocytes Relative: 28 %
Lymphs Abs: 3.1 10*3/uL (ref 0.7–4.0)
MCH: 20.1 pg — ABNORMAL LOW (ref 26.0–34.0)
MCHC: 30 g/dL (ref 30.0–36.0)
MCV: 66.9 fL — ABNORMAL LOW (ref 80.0–100.0)
Monocytes Absolute: 1 10*3/uL (ref 0.1–1.0)
Monocytes Relative: 9 %
Neutro Abs: 6.8 10*3/uL (ref 1.7–7.7)
Neutrophils Relative %: 62 %
Platelet Count: 306 10*3/uL (ref 150–400)
RBC: 4.38 MIL/uL (ref 4.22–5.81)
RDW: 19.4 % — ABNORMAL HIGH (ref 11.5–15.5)
WBC Count: 11 10*3/uL — ABNORMAL HIGH (ref 4.0–10.5)
nRBC: 0 % (ref 0.0–0.2)
nRBC: 0 /100{WBCs}

## 2023-03-12 LAB — IRON AND TIBC
Iron: 26 ug/dL — ABNORMAL LOW (ref 45–182)
Saturation Ratios: 6 % — ABNORMAL LOW (ref 17.9–39.5)
TIBC: 476 ug/dL — ABNORMAL HIGH (ref 250–450)
UIBC: 450 ug/dL

## 2023-03-12 LAB — FERRITIN: Ferritin: 3 ng/mL — ABNORMAL LOW (ref 24–336)

## 2023-03-12 NOTE — Progress Notes (Signed)
 QUESTIONS ON SDOH FORM GIVEN TO PATIENT DIFFER FROM THESE.  THE PATIENT ANSWERED YES TO : WITHIN THE PAST 12 MONTHS, HAVE YOU EVER STAYED OUTSIDE, IN A CAR, IN A TENT, IN AN OVERNIGHT SHELTER, OR TEMPORARILY IN SOMEONE ELSE'S HOME?  HE  ALSO ANSWERED YES TO : DO YOU HAVE PROBLEMS WITH PESTS, MOLD, LEAD, AND /OR WATER LEAKS AT THE PLACE WHERE YOU STAY.

## 2023-03-12 NOTE — Progress Notes (Signed)
 Vincent Guerrero  [address] [place],  [place]  [address] [phone]  Clinic Day:  03/12/2023  Referring physician: Turner Gains, PA-C   HISTORY OF PRESENT ILLNESS:  The patient is a 63 y.o. male  who I was asked to consult upon for iron deficiency anemia.  Recent labs showed a low hemoglobin of 8.2, with a low MCV of 75.  Iron studies done recently showed a low ferritin of 9, a low serum iron of 13, a TIBC of 360, and a low iron saturation of 3.6%.  Of note, he takes 2 BC's on a daily basis.  Before his GI workup in 2021, he claims he would take as many as 8 BC's daily.   His colonoscopy in October 2021 was normal.  His EGD done at the same time showed 2 non-bleeding gastric ulcers, which were clipped.  He denies having black, tarry stools or any other forms of blood loss.  He has been told of being iron deficient before for which IV iron has been given in the past.   He is currently not taking oral iron.  He claims his mother also had issues with anemia.  He is concerned as he believes he has lost 80 pounds over the past year.  PAST MEDICAL HISTORY:   Past Medical History:  Diagnosis Date   Age-related nuclear cataract of both eyes 11/27/2018   Anemia    Bilateral recurrent inguinal hernia without obstruction or gangrene 05/09/2016   Added automatically from request for surgery [id number]   Coronary artery disease involving native coronary artery without angina pectoris 05/2016   History of PCI to nondominant RCA. ->  Cath in April 2018 showed occluded nondominant RCA stent.  30% LM.  pLAD 40%.  Ostial RI 60%.   Cortical age-related cataract of both eyes 11/27/2018   Depression    Diabetes mellitus (HCC)    Hyperlipidemia LDL goal <70    Hypertension    Inguinal hernia without obstruction or gangrene    BILATERAL   Primary open angle glaucoma (POAG) of both eyes, severe stage 11/27/2018   Primary open angle glaucoma (POAG) of left eye, severe  stage 11/27/2018   Stroke (HCC)    Tobacco abuse 11/05/2019   Tobacco use 11/06/2019    PAST SURGICAL HISTORY:   Past Surgical History:  Procedure Laterality Date   AORTIC VALVE REPLACEMENT N/A 10/14/2020   Procedure: AORTIC VALVE REPLACEMENT (AVR) USING INSPIRIS RESILIA AORTIC VALVE;  Surgeon: Bartley Lightning, MD;  [facility];  Service: Open Heart Surgery;  Laterality: N/A;   BIOPSY  11/27/2019   Procedure: BIOPSY;  Surgeon: Albertina Hugger, MD;  Location: [facility] ENDOSCOPY;  Service: Gastroenterology;;   COLONOSCOPY WITH PROPOFOL  N/A 11/27/2019   Procedure: COLONOSCOPY WITH PROPOFOL ;  Surgeon: Albertina Hugger, MD;  Location: [facility] END[facility];  Service: Gastroenterology;  Laterality: N/A;   CORONARY ARTERY BYPASS GRAFT N/A 10/14/2020   Procedure: CORONARY ARTERY BYPASS GRAFTING (CABG) TIMES FOUR, USING LEFT INTERNAL MAMMARY ARTERY AND RIGHT GREATER SAPHENOUS VEIN HARVESTED ENDOSCOPICALLY;  Surgeon: Bartley Lightning, MD;  [facility];  Service: Open Heart Surgery;  Laterality: N/A;   CORONARY ULTRASOUND/IVUS N/A 09/12/2020   Procedure: Intravascular Ultrasound/IVUS;  Surgeon: Arleen Lacer, MD;  Location: [facility] INVASIVE CV LAB;  Service: Cardiovascular;  Laterality: N/A;   ESOPHAGOGASTRODUODENOSCOPY (EGD) WITH PROPOFOL  N/A 11/27/2019   Procedure: ESOPHAGOGASTRODUODENOSCOPY (EGD) WITH PROPOFOL ;  Surgeon: Albertina Hugger, MD;  Location: [facility]  END[facility]COPY;  Service: Gastroenterology;  Laterality: N/A;   HEMOSTASIS CLIP PLACEMENT  11/27/2019   Procedure: HEMOSTASIS CLIP PLACEMENT;  Surgeon: Albertina Hugger, MD;  [facility] ENDOSCOPY;  Service: Gastroenterology;;   HERNIA REPAIR  1998   LEFT HEART CATH AND CORONARY ANGIOGRAPHY N/A 06/04/2016   Procedure: Left Heart Cath and Coronary Angiography;  Surgeon: Arty Binning, MD;  Location: [facility] INVASIVE CV LAB;  Service: Cardiovascular;  Laterality: N/A;   RIGHT/LEFT HEART CATH AND CORONARY ANGIOGRAPHY N/A 09/12/2020   Procedure: RIGHT/LEFT HEART  CATH AND CORONARY ANGIOGRAPHY;  Surgeon: Arleen Lacer, MD;  Location: [facility] INVASIVE CV LAB;  Service: Cardiovascular;  Laterality: N/A;   TEE WITHOUT CARDIOVERSION N/A 08/30/2020   Procedure: TRANSESOPHAGEAL ECHOCARDIOGRAM (TEE);  Surgeon: Alroy Aspen Lela Purple, MD;  Location: [facility] ENDOSCOPY;  Service: Cardiovascular;  Laterality: N/A;   TEE WITHOUT CARDIOVERSION N/A 10/14/2020   Procedure: TRANSESOPHAGEAL ECHOCARDIOGRAM (TEE);  Surgeon: Bartley Lightning, MD;  Location: [facility] OR;  Service: Open Heart Surgery;  Laterality: N/A;    CURRENT MEDICATIONS:   Current Outpatient Medications  Medication Sig Dispense Refill   apixaban  (ELIQUIS ) 5 MG TABS tablet Take 1 tablet (5 mg total) by mouth 2 (two) times daily. 60 tablet 5   atorvastatin  (LIPITOR ) 80 MG tablet Take 1 tablet (80 mg total) by mouth daily. 30 tablet 1   hydrochlorothiazide (HYDRODIURIL) 25 MG tablet Take 25 mg by mouth daily.     isosorbide  mononitrate (IMDUR ) 60 MG 24 hr tablet Take 60 mg by mouth daily.     JARDIANCE  10 MG TABS tablet Take 10 mg by mouth daily.     meloxicam (MOBIC) 7.5 MG tablet Take 7.5 mg by mouth daily.     metFORMIN  (GLUCOPHAGE ) 1000 MG tablet Take 1,000 mg by mouth 2 (two) times daily with a meal.     metoprolol  (LOPRESSOR ) 50 MG tablet Take 1 tablet (50 mg total) by mouth 2 (two) times daily. 180 tablet 2   terazosin (HYTRIN) 1 MG capsule Take 1 mg by mouth daily.     valsartan  (DIOVAN ) 160 MG tablet Take 160 mg by mouth daily.     No current facility-administered medications for this visit.    ALLERGIES:   Allergies  Allergen Reactions   Cetacaine  [Butamben -Tetracaine -Benzocaine ] Shortness Of Breath and Other (See Comments)    Intense laryngospasm    FAMILY HISTORY:   Family History  Problem Relation Age of Onset   Heart failure Mother    Hypertension Mother    Glaucoma Mother    CAD Father    Diabetes Father    Hypertension Sister    Glaucoma Sister    Prostate cancer Brother    Diabetes Brother      SOCIAL HISTORY:  The patient was born and raised in [place].  He currently lives in town.  He is divorced, with 5 children and 4 grandchildren.  He did plumbing for 48 years.  There is a history of alcoholism, for which he quit 13 years ago.  He has smoked a pack of cigarettes daily for 47 years.    REVIEW OF SYSTEMS:  Review of Systems  Constitutional:  Positive for fatigue and unexpected weight change. Negative for fever.  Eyes:  Positive for eye problems.  Respiratory:  Positive for cough. Negative for chest tightness, hemoptysis and shortness of breath.   Cardiovascular:  Positive for palpitations. Negative for chest pain.  Gastrointestinal:  Positive for nausea and vomiting. Negative for abdominal distention, abdominal pain, blood  in stool, constipation and diarrhea.  Genitourinary:  Positive for nocturia. Negative for dysuria, frequency and hematuria.   Musculoskeletal:  Positive for arthralgias. Negative for back pain and myalgias.  Skin:  Negative for itching and rash.  Neurological:  Positive for headaches. Negative for dizziness and light-headedness.  Psychiatric/Behavioral:  Positive for depression. Negative for suicidal ideas. The patient is not nervous/anxious.      PHYSICAL EXAM:  Blood pressure (!) 87/50, pulse 86, temperature 98.6 F (37 C), temperature source Oral, resp. rate 16, height 5\' 10"  (1.778 m), weight 172 lb 8 oz (78.2 kg), SpO2 99%. Wt Readings from Last 3 Encounters:  03/12/23 172 lb 8 oz (78.2 kg)  09/19/21 195 lb 9.6 oz (88.7 kg)  06/14/21 196 lb 9.6 oz (89.2 kg)   Body mass index is 24.75 kg/m. Performance status (ECOG): 2 - Symptomatic, <50% confined to bed Physical Exam Constitutional:      Appearance: Normal appearance. He is not ill-appearing.  HENT:     Mouth/Throat:     Mouth: Mucous membranes are moist.     Pharynx: Oropharynx is clear. No oropharyngeal exudate or posterior oropharyngeal erythema.  Cardiovascular:     Rate and  Rhythm: Normal rate and regular rhythm.     Heart sounds: No murmur heard.    No friction rub. No gallop.  Pulmonary:     Effort: Pulmonary effort is normal. No respiratory distress.     Breath sounds: Normal breath sounds. No wheezing, rhonchi or rales.  Abdominal:     General: Bowel sounds are normal. There is no distension.     Palpations: Abdomen is soft. There is no mass.     Tenderness: There is no abdominal tenderness.  Musculoskeletal:        General: No swelling.     Right lower leg: No edema.     Left lower leg: No edema.  Lymphadenopathy:     Cervical: No cervical adenopathy.     Upper Body:     Right upper body: No supraclavicular or axillary adenopathy.     Left upper body: No supraclavicular or axillary adenopathy.     Lower Body: No right inguinal adenopathy. No left inguinal adenopathy.  Skin:    General: Skin is warm.     Coloration: Skin is not jaundiced.     Findings: No lesion or rash.  Neurological:     General: No focal deficit present.     Mental Status: He is alert and oriented to person, place, and time. Mental status is at baseline.  Psychiatric:        Mood and Affect: Mood normal.        Behavior: Behavior normal.        Thought Content: Thought content normal.     LABS:      Latest Ref Rng & Units 03/12/2023    3:18 PM 09/19/2021   12:00 AM 06/14/2021   11:22 AM  CBC  WBC 4.0 - 10.5 K/uL 11.0  11.7     13.2   Hemoglobin 13.0 - 17.0 g/dL 8.8  53.6     64.4   Hematocrit 39.0 - 52.0 % 29.3  40     40.9   Platelets 150 - 400 K/uL 306  210     227      This result is from an external source.      Latest Ref Rng & Units 09/19/2021   12:00 AM 12/19/2020   12:00 AM 11/02/2020  12:00 AM  CMP  BUN 4 - 21 27     41  21   Creatinine 0.6 - 1.3 1.1     1.2  1.3   Sodium 137 - 147 140     138  138   Potassium 3.5 - 5.1 mEq/L 4.1     3.7  4.5   Chloride 99 - 108 105     101  102   CO2 13 - 22 28     26  25    Calcium  8.7 - 10.7 9.5     9.3  9.4    Alkaline Phos 25 - 125 78     120  108   AST 14 - 40 32     47  27   ALT 10 - 40 U/L 23     60  15      This result is from an external source.    Latest Reference Range & Units 03/12/23 15:18  Iron 45 - 182 ug/dL 26 (L)  UIBC ug/dL 546  TIBC 270 - 350 ug/dL 093 (H)  Saturation Ratios 17.9 - 39.5 % 6 (L)  Ferritin 24 - 336 ng/mL 3 (L)  (L): Data is abnormally low (H): Data is abnormally high  ASSESSMENT & PLAN:  A 63 y.o. male who I was asked to consult upon for iron deficiency anemia.  I will arrange for him to receive IV iron over these next few weeks to rapidly replenish his iron stores and normalize his hemoglobin.  As mentioned previously, his GI workup in October 2021 came back negative.  If his iron deficiency persists over time, I would have him evaluated again per GI.  I would also consider having him undergo CT scans to ensure a non-GI etiology is not behind his iron deficiency anemia.  Otherwise, I will see him back in 3 months to reassess his iron and hemoglobin levels to see how well he responded to his upcoming IV iron.  The patient understands all the plans discussed today and is in agreement with them.  I do appreciate Turner Gains, PA-C for his new consult.   Annabella Elford Felicia Horde, MD

## 2023-03-13 ENCOUNTER — Other Ambulatory Visit: Payer: Self-pay

## 2023-03-13 ENCOUNTER — Encounter: Payer: Self-pay | Admitting: Oncology

## 2023-03-13 DIAGNOSIS — D500 Iron deficiency anemia secondary to blood loss (chronic): Secondary | ICD-10-CM

## 2023-03-15 ENCOUNTER — Inpatient Hospital Stay: Payer: Medicaid Other

## 2023-03-15 VITALS — BP 141/71 | HR 78 | Temp 98.0°F | Resp 16

## 2023-03-15 DIAGNOSIS — D509 Iron deficiency anemia, unspecified: Secondary | ICD-10-CM | POA: Diagnosis not present

## 2023-03-15 DIAGNOSIS — D500 Iron deficiency anemia secondary to blood loss (chronic): Secondary | ICD-10-CM

## 2023-03-15 MED ORDER — SODIUM CHLORIDE 0.9 % IV SOLN: INTRAVENOUS | Status: DC

## 2023-03-15 MED ORDER — SODIUM CHLORIDE 0.9 % IV SOLN
510.0000 mg | Freq: Once | INTRAVENOUS | Status: AC
  Administered 2023-03-15: 510 mg via INTRAVENOUS
  Filled 2023-03-15: qty 510

## 2023-03-15 NOTE — Patient Instructions (Signed)

## 2023-03-19 ENCOUNTER — Telehealth: Payer: Self-pay

## 2023-03-19 ENCOUNTER — Inpatient Hospital Stay: Payer: Medicaid Other

## 2023-03-19 VITALS — BP 111/58 | HR 95 | Temp 98.9°F | Resp 16

## 2023-03-19 DIAGNOSIS — D509 Iron deficiency anemia, unspecified: Secondary | ICD-10-CM | POA: Diagnosis not present

## 2023-03-19 DIAGNOSIS — D500 Iron deficiency anemia secondary to blood loss (chronic): Secondary | ICD-10-CM

## 2023-03-19 MED ORDER — SODIUM CHLORIDE 0.9 % IV SOLN
510.0000 mg | Freq: Once | INTRAVENOUS | Status: AC
  Administered 2023-03-19: 510 mg via INTRAVENOUS
  Filled 2023-03-19: qty 510

## 2023-03-19 MED ORDER — SODIUM CHLORIDE 0.9 % IV SOLN: INTRAVENOUS | Status: DC

## 2023-03-19 NOTE — Telephone Encounter (Signed)
[facility] Clinical Social Work  Clinical Social Work was referred by nurse for assessment of psychosocial needs.  Clinical Social Worker attempted to contact patient by phone x2 to offer support and assess for needs.  CSW unable to leave vm on telephone. Will attempt again.  Marguerita Merles, LCSW  Clinical Social Worker [facility]

## 2023-03-19 NOTE — Patient Instructions (Signed)

## 2023-03-21 ENCOUNTER — Telehealth: Payer: Self-pay

## 2023-03-21 NOTE — Telephone Encounter (Signed)
[facility] Clinical Social Work  Clinical Social Work was referred by nurse for assessment of psychosocial needs.  Clinical Social Worker attempted to contact patient by phone  x2to offer support and assess for needs. CSW unable to reach patient. Referral will be closed.    Marguerita Merles, LCSW  Clinical Social Worker [facility]

## 2023-05-25 ENCOUNTER — Encounter: Payer: Self-pay | Admitting: Oncology

## 2023-06-09 NOTE — Progress Notes (Unsigned)
 [facility]  [address] [place],  [place]  [address] [phone]  Clinic Day:  06/10/2023  Referring physician: Turner Gains, PA-C   HISTORY OF PRESENT ILLNESS:  The patient is a 63 y.o. male  who I recently began seeing for iron deficiency anemia.  He comes in today to reassess his iron and hemoglobin levels after recently receiving IV iron in February 2025. Overall, the patient feels much better since receiving his IV iron.  He denies having any overt forms of blood loss since his last visit.  Of note, the patient has stopped taking BC powder.  PHYSICAL EXAM:  Blood pressure 132/63, pulse 85, temperature 98.5 F (36.9 C), temperature source Oral, resp. rate 16, height 5\' 10"  (1.778 m), weight 177 lb 8 oz (80.5 kg), SpO2 92%. Wt Readings from Last 3 Encounters:  06/10/23 177 lb 8 oz (80.5 kg)  03/12/23 172 lb 8 oz (78.2 kg)  09/19/21 195 lb 9.6 oz (88.7 kg)   Body mass index is 25.47 kg/m. Performance status (ECOG): 2 - Symptomatic, <50% confined to bed Physical Exam Constitutional:      Appearance: Normal appearance. He is not ill-appearing.  HENT:     Mouth/Throat:     Mouth: Mucous membranes are moist.     Pharynx: Oropharynx is clear. No oropharyngeal exudate or posterior oropharyngeal erythema.  Cardiovascular:     Rate and Rhythm: Normal rate and regular rhythm.     Heart sounds: No murmur heard.    No friction rub. No gallop.  Pulmonary:     Effort: Pulmonary effort is normal. No respiratory distress.     Breath sounds: Normal breath sounds. No wheezing, rhonchi or rales.  Abdominal:     General: Bowel sounds are normal. There is no distension.     Palpations: Abdomen is soft. There is no mass.     Tenderness: There is no abdominal tenderness.  Musculoskeletal:        General: No swelling.     Right lower leg: No edema.     Left lower leg: No edema.  Lymphadenopathy:     Cervical: No cervical adenopathy.     Upper  Body:     Right upper body: No supraclavicular or axillary adenopathy.     Left upper body: No supraclavicular or axillary adenopathy.     Lower Body: No right inguinal adenopathy. No left inguinal adenopathy.  Skin:    General: Skin is warm.     Coloration: Skin is not jaundiced.     Findings: No lesion or rash.  Neurological:     General: No focal deficit present.     Mental Status: He is alert and oriented to person, place, and time. Mental status is at baseline.  Psychiatric:        Mood and Affect: Mood normal.        Behavior: Behavior normal.        Thought Content: Thought content normal.    LABS:      Latest Ref Rng & Units 06/10/2023    9:23 AM 03/12/2023    3:18 PM 09/19/2021   12:00 AM  CBC  WBC 4.0 - 10.5 K/uL 10.5  11.0  11.7      Hemoglobin 13.0 - 17.0 g/dL 91.4  8.8  78.2      Hematocrit 39.0 - 52.0 % 42.7  29.3  40      Platelets 150 - 400 K/uL 212  306  210         This result is from an external source.      Latest Ref Rng & Units 09/19/2021   12:00 AM 12/19/2020   12:00 AM 11/02/2020   12:00 AM  CMP  BUN 4 - 21 27     41  21   Creatinine 0.6 - 1.3 1.1     1.2  1.3   Sodium 137 - 147 140     138  138   Potassium 3.5 - 5.1 mEq/L 4.1     3.7  4.5   Chloride 99 - 108 105     101  102   CO2 13 - 22 28     26  25    Calcium  8.7 - 10.7 9.5     9.3  9.4   Alkaline Phos 25 - 125 78     120  108   AST 14 - 40 32     47  27   ALT 10 - 40 U/L 23     60  15      This result is from an external source.    ASSESSMENT & PLAN:  A 63 y.o. male with iron deficiency anemia.  I am very pleased with the significant improvement he has had with his hemoglobin levels since receiving IV iron.  Clinically, the patient appears to be doing well.  As that is the case, I will see him back in 6 months for repeat clinical assessment.  The patient understands all the plans discussed today and is in agreement with them.  Tamieka Rancourt Felicia Horde, MD

## 2023-06-10 ENCOUNTER — Other Ambulatory Visit: Payer: Self-pay

## 2023-06-10 ENCOUNTER — Inpatient Hospital Stay: Payer: Medicaid Other

## 2023-06-10 ENCOUNTER — Telehealth: Payer: Self-pay | Admitting: Oncology

## 2023-06-10 ENCOUNTER — Inpatient Hospital Stay: Payer: Medicaid Other | Attending: Oncology | Admitting: Oncology

## 2023-06-10 ENCOUNTER — Other Ambulatory Visit: Payer: Self-pay | Admitting: Oncology

## 2023-06-10 VITALS — BP 132/63 | HR 85 | Temp 98.5°F | Resp 16 | Ht 70.0 in | Wt 177.5 lb

## 2023-06-10 DIAGNOSIS — D500 Iron deficiency anemia secondary to blood loss (chronic): Secondary | ICD-10-CM | POA: Diagnosis not present

## 2023-06-10 DIAGNOSIS — D509 Iron deficiency anemia, unspecified: Secondary | ICD-10-CM | POA: Insufficient documentation

## 2023-06-10 LAB — CBC WITH DIFFERENTIAL (CANCER CENTER ONLY)
Abs Immature Granulocytes: 0.02 10*3/uL (ref 0.00–0.07)
Basophils Absolute: 0 10*3/uL (ref 0.0–0.1)
Basophils Relative: 0 %
Eosinophils Absolute: 0.2 10*3/uL (ref 0.0–0.5)
Eosinophils Relative: 2 %
HCT: 42.7 % (ref 39.0–52.0)
Hemoglobin: 13.1 g/dL (ref 13.0–17.0)
Immature Granulocytes: 0 %
Lymphocytes Relative: 42 %
Lymphs Abs: 4.4 10*3/uL — ABNORMAL HIGH (ref 0.7–4.0)
MCH: 25.1 pg — ABNORMAL LOW (ref 26.0–34.0)
MCHC: 30.7 g/dL (ref 30.0–36.0)
MCV: 81.8 fL (ref 80.0–100.0)
Monocytes Absolute: 1.2 10*3/uL — ABNORMAL HIGH (ref 0.1–1.0)
Monocytes Relative: 12 %
Neutro Abs: 4.7 10*3/uL (ref 1.7–7.7)
Neutrophils Relative %: 44 %
Platelet Count: 212 10*3/uL (ref 150–400)
RBC: 5.22 MIL/uL (ref 4.22–5.81)
RDW: 24.4 % — ABNORMAL HIGH (ref 11.5–15.5)
WBC Count: 10.5 10*3/uL (ref 4.0–10.5)
nRBC: 0 % (ref 0.0–0.2)
nRBC: 0 /100{WBCs}

## 2023-06-10 LAB — IRON AND TIBC
Iron: 30 ug/dL — ABNORMAL LOW (ref 45–182)
Saturation Ratios: 8 % — ABNORMAL LOW (ref 17.9–39.5)
TIBC: 378 ug/dL (ref 250–450)
UIBC: 348 ug/dL

## 2023-06-10 LAB — FERRITIN: Ferritin: 12 ng/mL — ABNORMAL LOW (ref 24–336)

## 2023-06-10 NOTE — Telephone Encounter (Signed)
 Patient has been scheduled for follow-up visit per 06/10/23 LOS.  Pt aware of scheduled appt details.

## 2023-07-10 ENCOUNTER — Telehealth: Payer: Self-pay

## 2023-07-10 NOTE — Telephone Encounter (Signed)
 1st attempt to reach pt regarding surgical clearance and the need for an IN OFFICE appointment. Unable to LVM as mailbox has not yet been set up.

## 2023-07-10 NOTE — Telephone Encounter (Signed)
   Pre-operative Risk Assessment    Patient Name: Vincent Guerrero  DOB: [birth year] MRN: [id number]   Date of last office visit: 11/25/22 Date of next office visit: Not scheduled   Request for Surgical Clearance    Procedure:  EGD  Date of Surgery:  Clearance TBD                                Surgeon:  Dr. Murray Arnold Group or Practice Name:  [facility] Phone number:  [phone] Fax number:  [phone]   Type of Clearance Requested:   - Medical  - Pharmacy:  Hold Apixaban  (Eliquis ) x2 days prior   Type of Anesthesia:  Not Indicated   Additional requests/questions:    Gardiner Jumper   07/10/2023, 4:01 PM

## 2023-07-10 NOTE — Telephone Encounter (Signed)
   Name: Vincent Guerrero  DOB: 10-29-1960  MRN: [id number]  Primary Cardiologist: Kardie Tobb, DO  Chart reviewed as part of pre-operative protocol coverage. Because of Hektor Huston Knoedler's past medical history and time since last visit, he will require a follow-up in-office visit in order to better assess preoperative cardiovascular risk.  Pre-op  covering staff: - Please schedule appointment and call patient to inform them. If patient already had an upcoming appointment within acceptable timeframe, please add "pre-op  clearance" to the appointment notes so provider is aware. - Please contact requesting surgeon's office via preferred method (i.e, phone, fax) to inform them of need for appointment prior to surgery.  This message will also be routed to pharmacy pool  for input on holding Eliquis  as requested below so that this information is available to the clearing provider at time of patient's appointment.   Francene Ing, Retha Cast, NP  07/10/2023, 4:19 PM

## 2023-07-12 IMAGING — CT CT ANGIO CHEST
2 of 7 series · 18 of 46 positions shown · IV contrast (APPLIED)
Comparison: Chest x-ray October 23, 2020.

CLINICAL DATA: Diaphoresis, lightheadedness, chest pain, and
shortness of breath. CABG 1 week ago.

EXAM:
CT ANGIOGRAPHY CHEST WITH CONTRAST
TECHNIQUE: Multidetector CT imaging of the chest was performed using the
standard protocol during bolus administration of intravenous
contrast. Multiplanar CT image reconstructions and MIPs were
obtained to evaluate the vascular anatomy.
CONTRAST:  75mL OMNIPAQUE IOHEXOL 350 MG/ML SOLN

[Series 7: thins · axial · 0.73mm/px · z∈[+1219,+1531]mm · 15 of 502 slices shown]
[im 28/502  lung]
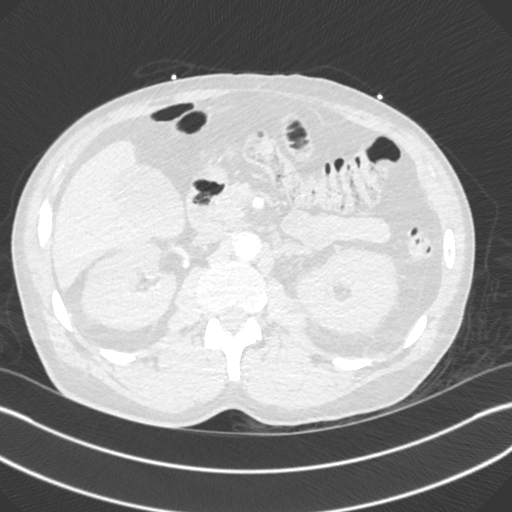
[im 56/502  soft-tissue]
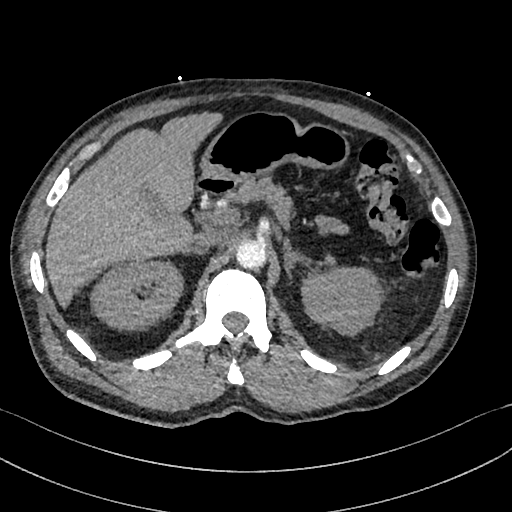
[im 84/502  lung]
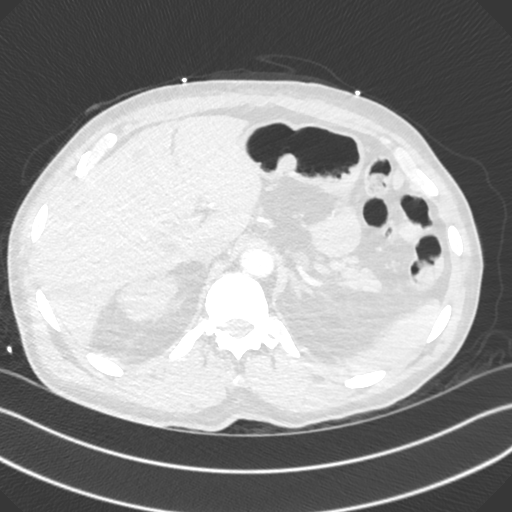
[im 112/502  soft-tissue]
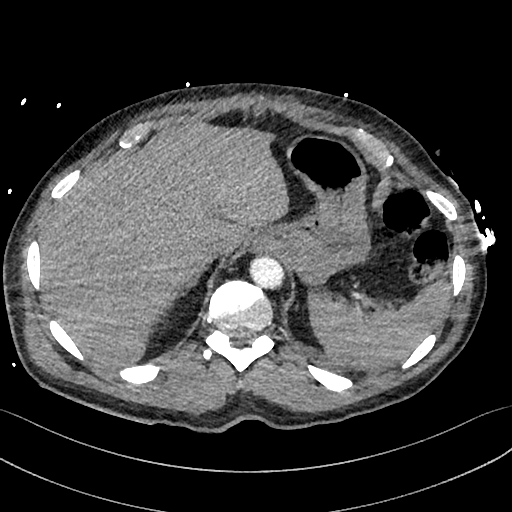
[im 168/502  lung]
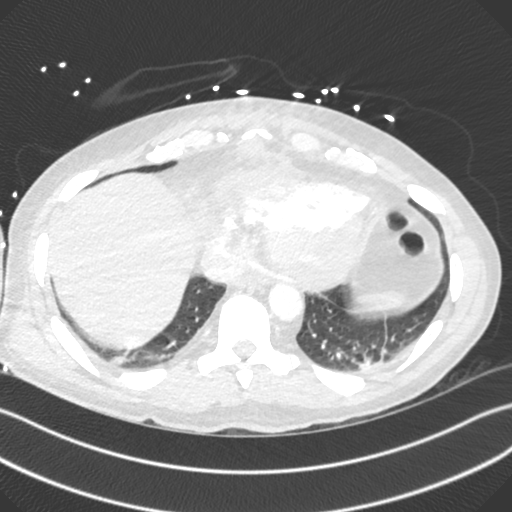
[im 195/502  soft-tissue]
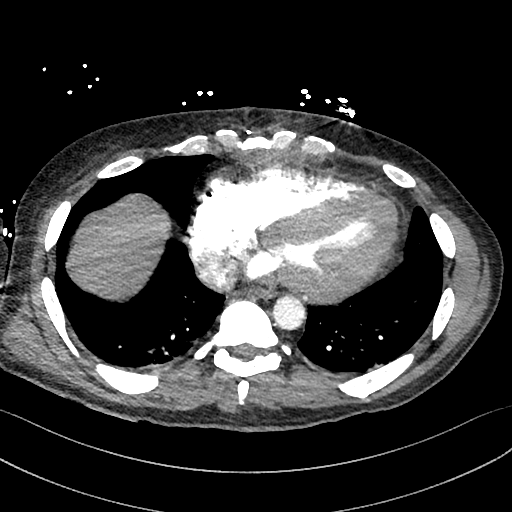
[im 223/502  lung]
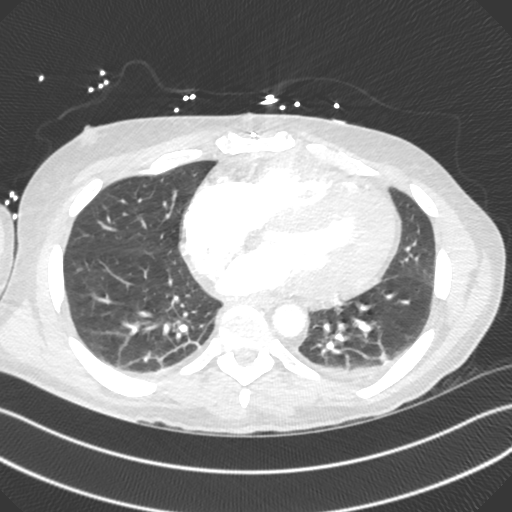
[im 251/502  soft-tissue]
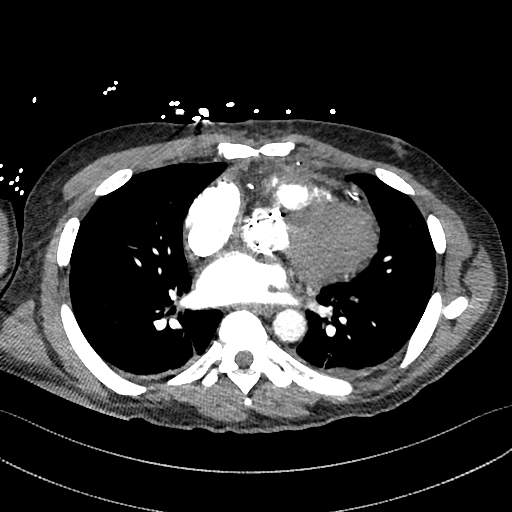
[im 279/502  lung]
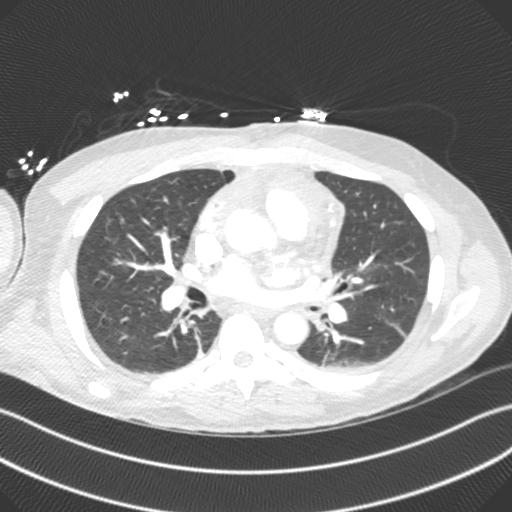
[im 307/502  soft-tissue]
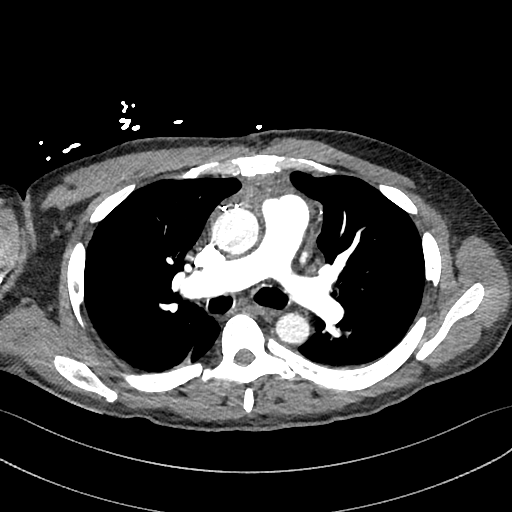
[im 335/502  lung]
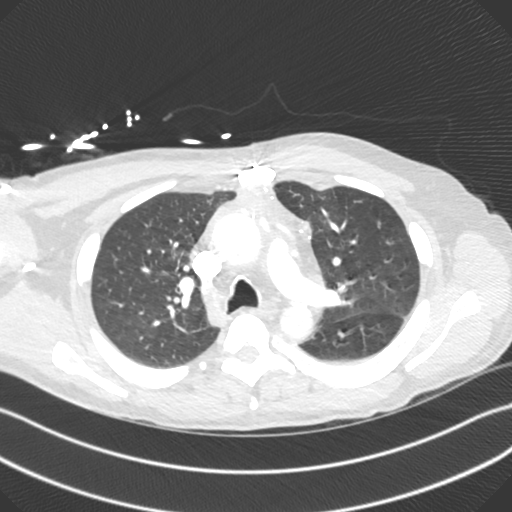
[im 390/502  soft-tissue]
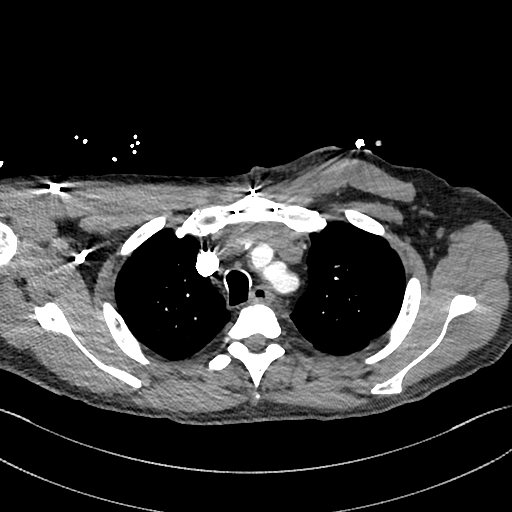
[im 418/502  lung]
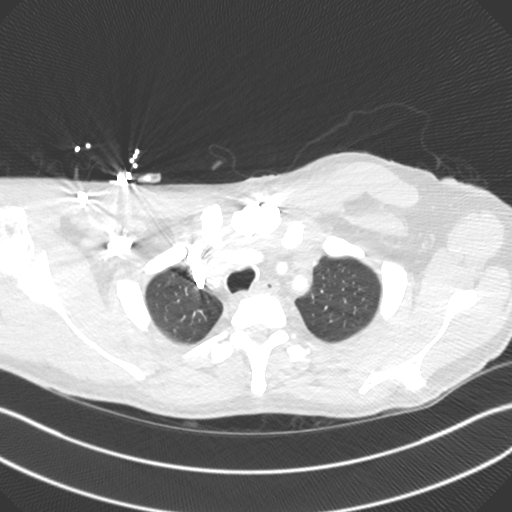
[im 446/502  soft-tissue]
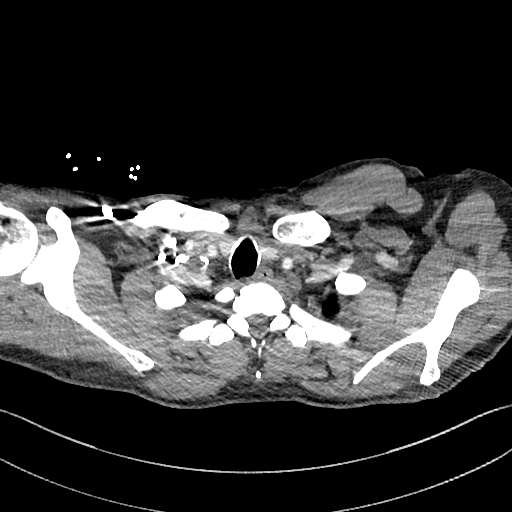
[im 474/502  lung]
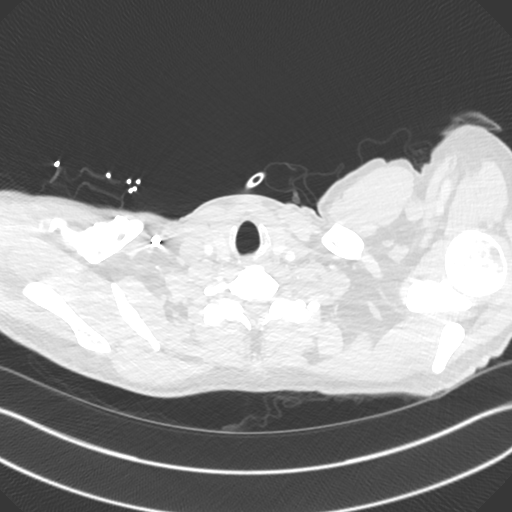

[Series 8: cor · coronal · 0.70mm/px · 3 of 136 slices shown]
[im 34/136  soft-tissue]
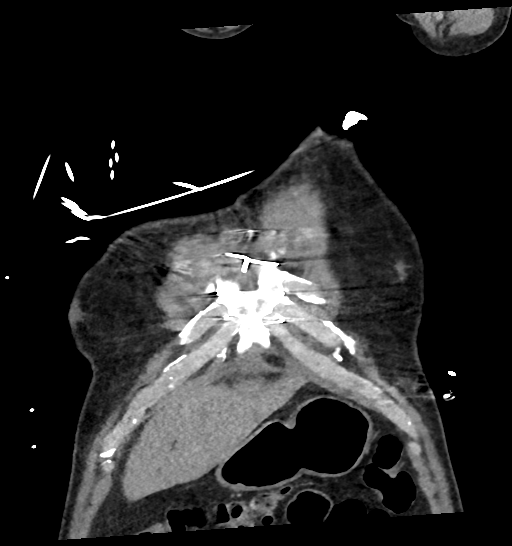
[im 68/136  soft-tissue]
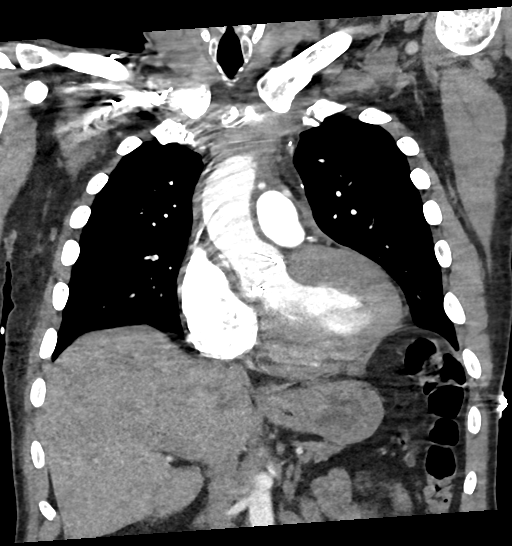
[im 102/136  soft-tissue]
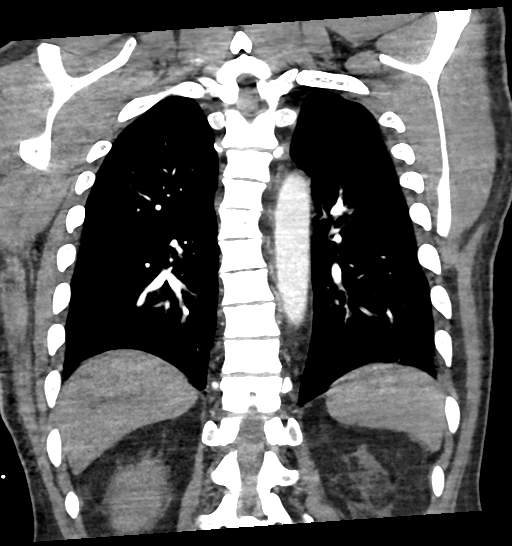

[18 of 46 positions shown; findings below may reference images not displayed]

FINDINGS: Cardiovascular: The thoracic aorta is normal in caliber with no
aneurysm or dissection. No significant atherosclerotic change
identified. Cardiomegaly is noted. There appear to be a few small
emboli in the left lower lobe. Three pulmonary emboli are identified
in the left base. No other emboli identified outside of the left
base.

Mediastinum/Nodes: Postoperative changes are seen in the
mediastinum. No pleural effusions. No suspicious pericardial
effusion. No adenopathy. The esophagus and thyroid are normal.

Lungs/Pleura: Bronchial wall thickening in the bases is identified.
Central airways are otherwise normal. No pneumothorax. Mild
dependent atelectasis. No nodules, masses, or focal infiltrates.

Upper Abdomen: Possible sludge or stones in the gallbladder without
wall thickening or adjacent stranding. Probable cysts in the liver,
too small to characterize.

Musculoskeletal: No chest wall abnormality. No acute or significant
osseous findings.

Review of the MIP images confirms the above findings.
IMPRESSION: 1. Three left lower lobe pulmonary emboli, 1 segmental and 2
subsegmental.
2. Cardiomegaly.
3. Postoperative changes in the mediastinum after CABG 1 week ago.
4. Bronchial wall thickening in the bases, nonspecific.
5. Suggested sludge or stones in the gallbladder without wall
thickening.

Findings called to the patient's ER physician.

## 2023-07-12 NOTE — Telephone Encounter (Signed)
 2nd attempt to reach pt to schedule appt in office for preop clearance. Vm is not set up, cannot leave message to call back.   I will update the requesting office if they s/w the pt let him know he needs to call [phone] and schedule appt in office for preop clearance.

## 2023-07-16 NOTE — Telephone Encounter (Signed)
 Preop clearance now scheduled.

## 2023-07-19 NOTE — Telephone Encounter (Signed)
 Requesting office sent duplicate inquiring if pt has been cleared. Pt has appt with Palmer Bobo, NP 07/31/23. Once the pt has been cleared the provider has cleared the pt he will fax over his notes to your office.

## 2023-07-22 NOTE — Telephone Encounter (Signed)
 Patient with diagnosis of atrial fibrillation and VTE on Eliquis  for anticoagulation.    Procedure: EGD Date of procedure: TBD   CHA2DS2-VASc Score = 5   This indicates a 7.2% annual risk of stroke. The patient's score is based upon: CHF History: 0 HTN History: 1 Diabetes History: 1 Stroke History: 2 Vascular Disease History: 1 Age Score: 0 Gender Score: 0  Per chart stroke was in 2011, no other information found.  Had provoked PE after AV replacement 10/2020 with 3 left lower pulmonary emboli  CrCl 71 Platelet count 212  Patient has not had an Afib/aflutter ablation within the last 3 months or DCCV within the last 30 days  Previously Dr. Emmette Harms noted that if > 1 day hold would need to be bridged with enoxaparin .  That was just 1 year post PE.  Has been almost 3 years now since event.  Request is for 2 day hold.  Will review with MD - suggest after patient clearance visit - has not been seen since 11/2021.    **This guidance is not considered finalized until pre-operative APP has relayed final recommendations.**

## 2023-07-29 ENCOUNTER — Encounter: Payer: Self-pay | Admitting: *Deleted

## 2023-07-30 ENCOUNTER — Encounter: Payer: Self-pay | Admitting: *Deleted

## 2023-07-31 ENCOUNTER — Encounter: Payer: Self-pay | Admitting: Emergency Medicine

## 2023-07-31 ENCOUNTER — Ambulatory Visit: Attending: Cardiology | Admitting: Emergency Medicine

## 2023-07-31 VITALS — BP 124/72 | HR 69 | Ht 70.0 in | Wt 170.0 lb

## 2023-07-31 DIAGNOSIS — I9789 Other postprocedural complications and disorders of the circulatory system, not elsewhere classified: Secondary | ICD-10-CM | POA: Diagnosis present

## 2023-07-31 DIAGNOSIS — Z952 Presence of prosthetic heart valve: Secondary | ICD-10-CM | POA: Insufficient documentation

## 2023-07-31 DIAGNOSIS — I2510 Atherosclerotic heart disease of native coronary artery without angina pectoris: Secondary | ICD-10-CM | POA: Diagnosis not present

## 2023-07-31 DIAGNOSIS — E785 Hyperlipidemia, unspecified: Secondary | ICD-10-CM | POA: Diagnosis present

## 2023-07-31 DIAGNOSIS — Z86711 Personal history of pulmonary embolism: Secondary | ICD-10-CM | POA: Insufficient documentation

## 2023-07-31 DIAGNOSIS — Z01810 Encounter for preprocedural cardiovascular examination: Secondary | ICD-10-CM | POA: Diagnosis not present

## 2023-07-31 DIAGNOSIS — I4891 Unspecified atrial fibrillation: Secondary | ICD-10-CM | POA: Insufficient documentation

## 2023-07-31 DIAGNOSIS — I10 Essential (primary) hypertension: Secondary | ICD-10-CM | POA: Insufficient documentation

## 2023-07-31 DIAGNOSIS — I350 Nonrheumatic aortic (valve) stenosis: Secondary | ICD-10-CM | POA: Insufficient documentation

## 2023-07-31 NOTE — Progress Notes (Addendum)
 Cardiology Office Note:    Date:  07/31/2023  ID:  Vincent Guerrero, DOB June 01, 1960, MRN [id number] PCP: Edda Hadassah Dragon, PA-C  [facility] Providers Cardiologist:  Dub Huntsman, DO       Patient Profile:       Chief Complaint: Preoperative cardiovascular exam History of Present Illness:  Vincent Guerrero is a 63 y.o. male with visit-pertinent history of hypertension, aortic valve replacement, coronary artery disease, hyperlipidemia, CVA, chronic iron deficiency anemia, pulmonary embolism and paroxysmal atrial fibrillation  Patient has history of severe aortic stenosis s/p AVR with 25 mm Edwards pericardial valve on 10/14/2020.  She also has history of coronary artery disease with prior stent to RCA in 2010 and CABG at time of AVR with LIMA-LAD, SVG-RI, sequential SVG-OM-distal LCx he did have postoperative atrial fibrillation but converted back to NSR and he was started on amiodarone .  Postsurgery he was admitted on 10/23/2020 for chest pain and dyspnea on exertion.  Chest CTA showed 3 left lower lobe, 1 segmental, and 2 subsegmental PE.  Echocardiogram the time showed LVEF 50 to 55% with septal hypokinesis and grade 1 DD.  Echocardiogram 11/28/2020 showed LVEF 60 to 65%, no RWMA, severe concentric LVH, grade 1 DD, AVR consistent with normal structure and function.  He was seen in office on 01/05/2021.  He was maintaining NSR and doing well.  At that time his amiodarone  was discontinued. Patient was last seen in office over 2 years ago on 06/14/2021.  Patient was doing well at the time.  No medication changes were made.  Most recently she underwent Lexiscan  Myoview  on 08/13/2022 prior to knee surgery that was low risk and negative for ischemia.  Patient is now pending EGD with [facility] on date TBD.   Discussed the use of AI scribe software for clinical note transcription with the patient, who gave verbal consent to proceed.  History of Present  Illness Vincent Guerrero is a 63 year old male with aortic valve replacement and coronary artery disease who presents for cardiovascular follow-up.  He has not been seen in over 2 years.  He presents today for preoperative cardiovascular exam.  Today he tells me he is doing well overall.  He is without anginal symptoms.  He denies any exertional chest pains.  Tells me he feels great.  He is without any dyspnea, orthopnea, PND, weight gain.  He denies any lightheadedness, dizziness, syncope, presyncope.  He does remain fairly active.  He enjoys working out in his yard and doing yard work.  He is able to walk 1 block or walk up stairs without any anginal symptoms.  He does house chores and work without limitation.  He does tell me he gets frequent headaches for which he takes Goody's powders.  When he does take his Goody's powders he will not take his Eliquis .  He does tell me he misses his Eliquis  frequently due to this but has not missed more than 4 days in a row.  He is pending EGD due to experiencing unexplained blood loss.  He does refuse further colonoscopies due to discomfort from a previous procedure.   Review of systems:  Please see the history of present illness. All other systems are reviewed and otherwise negative.      Studies Reviewed:    EKG Interpretation Date/Time:  Wednesday July 31 2023 14:03:01 EDT Ventricular Rate:  69 PR Interval:  172 QRS Duration:  118 QT Interval:  380 QTC Calculation: 407 R Axis:  98  Text Interpretation: Normal sinus rhythm Possible Left atrial enlargement Rightward axis Left ventricular hypertrophy with QRS widening ( Cornell product ) When compared with ECG of 23-Oct-2020 17:15, PREVIOUS ECG IS PRESENT Confirmed by Rana Dixon [phone]) on 07/31/2023 4:56:02 PM    Echocardiogram 11/28/2020  1. Left ventricular ejection fraction, by estimation, is 60 to 65%. The  left ventricle has normal function. The left ventricle has no regional   wall motion abnormalities. There is severe concentric left ventricular  hypertrophy. Left ventricular diastolic   parameters are consistent with Grade I diastolic dysfunction (impaired  relaxation). The average left ventricular global longitudinal strain is  -9.4 %. The global longitudinal strain is abnormal.   2. Right ventricular systolic function is normal. The right ventricular  size is normal. There is normal pulmonary artery systolic pressure.   3. The mitral valve is normal in structure. No evidence of mitral valve  regurgitation. No evidence of mitral stenosis.   4. Aortic valve regurgitation is not visualized. There is a INSPIRIS  RESILIA AORTIC VALVE valve present in the aortic position. Procedure Date:  10/14/2020. Echo findings are consistent with normal structure and  function of the aortic valve  prosthesis.   5. The inferior vena cava is normal in size with greater than 50%  respiratory variability, suggesting right atrial pressure of 3 mmHg.   R/L cardiac catheterization 09/12/2020   Ost LAD to Mid LAD lesion is 65% stenosed.   Mid LM to Ost LAD lesion is 35% stenosed with 70% stenosed side branch in Ramus.   1st LPL lesion is 99% stenosed.   Ost RCA to Prox RCA lesion is 100% stenosed.   1st Mrg lesion is 70% stenosed.   LV end diastolic pressure is normal.   There is moderate aortic valve stenosis.   SUMMARY Multivessel CAD:  Known CTO of nondominant RCA, and now CTO of LPL 1 along  70% ostial stenosis of Ramus Intermedius and 1st Mrg Angiographically mild to moderate disease in the proximal LAD, however, by IVUS 2 tandem lesions of 65% stenosis. Angiographically concerning distal left main lesion that is new.  By IVUS appears to be a potential healed dissection. Normal Right Heart Cath Pressures: PA mean 18 mmHg with a Wedge pressure of 9 mm.  LVEDP of 11 mmHg. Moderate aortic stenosis by pressure measurement: Mean gradient 27 mmHg.   RECOMMENDATIONS With  plans for open AVR, would consider the possibility of CABG -grafting LAD, RI as well as potentially branches of the dominant LCx. (Left Main lesion is not significant, but raises the specter of of unstable plaque in the Left Main and proximal LAD) Diagnostic Dominance: Left  Risk Assessment/Calculations:    CHA2DS2-VASc Score = 5   This indicates a 7.2% annual risk of stroke. The patient's score is based upon: CHF History: 0 HTN History: 1 Diabetes History: 1 Stroke History: 2 Vascular Disease History: 1 Age Score: 0 Gender Score: 0             Physical Exam:   VS:  BP 124/72 (BP Location: Left Arm, Patient Position: Sitting, Cuff Size: Normal)   Pulse 69   Ht 5' 10 (1.778 m)   Wt 170 lb (77.1 kg)   BMI 24.39 kg/m    Wt Readings from Last 3 Encounters:  07/31/23 170 lb (77.1 kg)  06/10/23 177 lb 8 oz (80.5 kg)  03/12/23 172 lb 8 oz (78.2 kg)    GEN: Well nourished, well developed in no  acute distress NECK: No JVD; No carotid bruits CARDIAC: 2/6 systolic murmur.  No murmurs, rubs, gallops RESPIRATORY:  Clear to auscultation without rales, wheezing or rhonchi  ABDOMEN: Soft, non-tender, non-distended EXTREMITIES:  No edema; No acute deformity      Assessment and Plan:  Coronary artery disease S/p stent to RCA in 2010 S/p CABG with LIMA-LAD, SVG-RI, and sequential SVG-OM-distal LCx on 10/14/2020 Lexiscan  Myoview  08/2022 was negative for ischemia - EKG today shows no acute ischemic changes - Today he is without any anginal symptoms.  He has had a reassuring stress test within the past year.  There is no indication for further ischemic evaluation at this time. - Not on ASA given OAC - Continue atorvastatin  80 mg daily  Severe aortic stenosis S/p AVR with 25 mm Edwards pericardial valve on 10/14/2020 Most recent echocardiogram 11/2020 showed normal structure and function of aortic valve prosthesis - Today he is overall asymptomatic.  No dyspnea, chest pains, syncope -  Plan to repeat echocardiogram for routine monitoring of AVR  Postoperative atrial fibrillation He had episodes of postop atrial fibrillation after CABG/AVR on 10/2020 and has been off of amiodarone  therapy since 01/2021 - His atrial fibrillation was likely periprocedural, he has remained on OAC since 2022 with no reoccurrences - His EKG today shows that he is maintaining NSR - Can consider ZIO in future and discussion with primary cardiologist if Eliquis  will need to be continued lifelong - Continue Eliquis  5 mg twice daily, no bleeding concerns  Hypertension Blood pressure today is well-controlled at 124/72 - Continue current antihypertensive regimen  Hyperlipidemia Do not currently have his most updated lipid panel.  His latest LDL follows 02/25/2020.  Will request labs from PCP Can consider repeating fasting lipid panel at follow-up visit - Continue atorvastatin  80 mg daily  History of pulmonary embolism Had provoked PE after AV replacement on 10/2020 with chest CTA showing 3 left lower lobe, 1 segmental, and 2 subsegmental PE - Has been successfully treated with OAC for >6 months - Today he is asymptomatic without chest pains or dyspnea - He has remained on OAC due to history of atrial fibrillation  Preoperative cardiovascular clearance According to the Revised Cardiac Risk Index (RCRI), his Perioperative Risk of Major Cardiac Event is (%): 6.6. His Functional Capacity in METs is: 5.07 according to the Duke Activity Status Index (DASI).  - He will need updated echocardiogram completed prior to cardiovascular clearance to reevaluate his AVR and LV/RV function  - Per Dr. Sheena: Ok to hold the Eliquis  for 2 days         Dispo:  Return in about 6 months (around 01/30/2024).  Signed, Lum LITTIE Louis, NP

## 2023-07-31 NOTE — Patient Instructions (Signed)
 Medication Instructions:  NO CHANGES   Lab Work: BMET AND CBC TO BE DONE TODAY. If you have labs (blood work) drawn today and your tests are completely normal, you will receive your results only by:   Testing/Procedures: Your physician has requested that you have an echocardiogram. Echocardiography is a painless test that uses sound waves to create images of your heart. It provides your doctor with information about the size and shape of your heart and how well your heart's chambers and valves are working. This procedure takes approximately one hour. There are no restrictions for this procedure. Please do NOT wear cologne, perfume, aftershave, or lotions (deodorant is allowed). Please arrive 15 minutes prior to your appointment time.  Please note: We ask at that you not bring children with you during ultrasound (echo/ vascular) testing. Due to room size and safety concerns, children are not allowed in the ultrasound rooms during exams. Our front office staff cannot provide observation of children in our lobby area while testing is being conducted. An adult accompanying a patient to their appointment will only be allowed in the ultrasound room at the discretion of the ultrasound technician under special circumstances. We apologize for any inconvenience.   Follow-Up: At [facility], you and your health needs are our priority.  As part of our continuing mission to provide you with exceptional heart care, our providers are all part of one team.  This team includes your primary Cardiologist (physician) and Advanced Practice Providers or APPs (Physician Assistants and Nurse Practitioners) who all work together to provide you with the care you need, when you need it.  Your next appointment:   6 MONTHS  Provider:   Kardie Tobb, DO

## 2023-08-01 NOTE — Telephone Encounter (Signed)
 Based on office visit 6/25 it sounds as though patient routinely misses doses of Eliquis  to take Goody powders.  Would suspect he will be fine with 2 day hold, but will confirm with Dr. Sheena.

## 2023-08-02 NOTE — Telephone Encounter (Signed)
 Patient has been cleared to hold Eliquis  per pharmacy, but needs to have echo first to be cleared medically with echocardiogram follow up per office note on 07/31/2023 by [facility]. Echo is scheduled on 09/17/2023.

## 2023-08-19 NOTE — Telephone Encounter (Signed)
   Patient Name: Vincent Guerrero  DOB: 1960-07-12 MRN: [id number]  Primary Cardiologist: Kardie Tobb, DO  Chart reviewed as part of pre-operative protocol coverage. Patient was already seen 07/31/23 by Lum Louis, NP for pre-op  evaluation at which time echo was ordered and is pending 09/17/23.  Per office protocol, the provider who saw the patient should forward their finalized clearance decision to requesting party below upon completion of testing. I will route this message to [facility] so they are aware of where to fax final recommendation to.  Will route this message to surgeon as FYI. Will remove this message from the pre-op  box as separate preop APP input is not needed.  Thai Burgueno N Carrieann Spielberg, PA-C 08/19/2023, 8:27 AM

## 2023-09-02 NOTE — Telephone Encounter (Signed)
 In reviewing previous documentation, patient will need to undergo echo. This is scheduled for 09/17/2023. Lum Louis, NP, the ordering provider, will finalize preoperative assessment after echo is resulted.   Will remove from the pre-op  pool.   Thank you!  Barnie HERO. Nephi Savage, DNP, NP-C  09/02/2023, 3:15 PM [facility] [address] Office [phone] Fax [phone]

## 2023-09-02 NOTE — Telephone Encounter (Signed)
 Outgoing call to schedule patient for bilateral hand pain. Patient had referral in the system from May 2025 and has been called but unable to reach. Received call from [facility] asking to call him once more to schedule.  Called patient 2x, no answer. Referral will remain closed.   Sending as FYI

## 2023-09-02 NOTE — Telephone Encounter (Signed)
 I will update the requesting office the pt has echo 09/17/23 that was ordered at last ov. Once the pt has been cleared our office will fax notes providing clearance.

## 2023-09-02 NOTE — Telephone Encounter (Signed)
 I will forward to preop APP to review if the pt has been cleared. Pt saw Lum Louis, NP 07/31/23, which echo was ordered at that time.

## 2023-09-16 NOTE — Telephone Encounter (Signed)
 2nd request received. Will route to the preop pool for the Preop APP to review.

## 2023-09-17 ENCOUNTER — Ambulatory Visit (HOSPITAL_COMMUNITY)
Admission: RE | Admit: 2023-09-17 | Discharge: 2023-09-17 | Disposition: A | Discharge status: Home / Self Care | Source: Ambulatory Visit | Attending: Cardiology | Admitting: Cardiology

## 2023-09-17 DIAGNOSIS — I350 Nonrheumatic aortic (valve) stenosis: Secondary | ICD-10-CM | POA: Diagnosis present

## 2023-09-17 DIAGNOSIS — Z952 Presence of prosthetic heart valve: Secondary | ICD-10-CM | POA: Insufficient documentation

## 2023-09-17 LAB — ECHOCARDIOGRAM COMPLETE
AR max vel: 1.44 cm2
AV Area VTI: 1.21 cm2
AV Area mean vel: 1.11 cm2
AV Mean grad: 13 mmHg
AV Peak grad: 21 mmHg
Ao pk vel: 2.29 m/s
Area-P 1/2: 2.85 cm2
S' Lateral: 3 cm

## 2023-09-18 ENCOUNTER — Ambulatory Visit: Payer: Self-pay | Admitting: Cardiology

## 2023-09-18 NOTE — Progress Notes (Signed)
 Normal LV function and bioprosthetic aortic valve is normal.  Diastolic function means the heart has to relax to receive the blood so it can pump the blood out. If relaxation is impaired, then the blood coming to the heart is restricted and can cause dyspnea and reduced functional capacity and fluid build up. Exercise, weight loss, control of BP, salt restriction will improve this.

## 2023-10-03 ENCOUNTER — Encounter: Payer: Self-pay | Admitting: *Deleted

## 2023-10-17 ENCOUNTER — Telehealth: Payer: Self-pay | Admitting: *Deleted

## 2023-10-17 NOTE — Telephone Encounter (Signed)
 Surgeon office sent duplicate request. I will forward notes to preop APP to review if the pt has been cleared.

## 2023-10-17 NOTE — Telephone Encounter (Signed)
 Me    10/17/23  5:35 PM Note Surgeon office sent duplicate request. I will forward notes to preop APP to review if the pt has been cleared.

## 2023-10-18 NOTE — Telephone Encounter (Signed)
   Patient Name: Vincent Guerrero  DOB: 12/13/1960 MRN: [id number]  Primary Cardiologist: Kardie Tobb, DO  Chart reviewed as part of pre-operative protocol coverage. Given past medical history and time since last visit, based on ACC/AHA guidelines, Vincent Guerrero is at acceptable risk for the planned procedure without further cardiovascular testing.   Echocardiogram revealed normal EF with Grade I diastolic dysfunction.  Not prohibitive for moving forward with surgery.   The patient was advised that if he develops new symptoms prior to surgery to contact our office to arrange for a follow-up visit, and he verbalized understanding.  I will route this recommendation to the requesting party via Epic fax function and remove from pre-op  pool.  Please call with questions.  Lamarr Satterfield, NP 10/18/2023, 7:58 AM

## 2023-10-18 NOTE — Telephone Encounter (Signed)
    Jerilynn Lamarr HERO, NP Nurse Practitioner Cardiology   Telephone Encounter Signed   Creation Time: 10/18/2023  7:58 AM   Signed         Patient Name: Vincent Guerrero  DOB: 1960/03/02 MRN: [id number]   Primary Cardiologist: Dub Huntsman, DO   Chart reviewed as part of pre-operative protocol coverage. Given past medical history and time since last visit, based on ACC/AHA guidelines, Vincent Guerrero is at acceptable risk for the planned procedure without further cardiovascular testing.   Echocardiogram revealed normal EF with Grade I diastolic dysfunction.  Not prohibitive for moving forward with surgery.   Per Dr. Huntsman he may hold Eliquis  for 2 days prior to procedure.    The patient was advised that if he develops new symptoms prior to surgery to contact our office to arrange for a follow-up visit, and he verbalized understanding.   I will route this recommendation to the requesting party via Epic fax function and remove from pre-op  pool.   Please call with questions.   Lamarr Jerilynn, NP 10/18/2023, 7:58 AM

## 2023-10-21 NOTE — Telephone Encounter (Signed)
 Will fax preop clearance to the surgeons office

## 2023-11-08 NOTE — Telephone Encounter (Signed)
 REQUESTING OFFICE SENT DUPLICATE AGAIN.   Looks to be the information they are high lighting is asking for:  RISK ASSESSMENT  Dr. Marvis wants to know if:   LOW RISK: ACCEPTABLE TO STOP MEDICATION FOR THE TIME REQUESTED ABOVE  MEDIUM RISK: ACCEPTABLE TO STOP MEDICATION FOR THE TIME REQUESTED ABOVE WITH LOVENOX  BRIDGE OR WITH OUT LOVENOX  BRIDGE  HIGH RISK: DO NOT TOP MEDICATION. PLEASE CONTACT ME TO DISCUSS FURTHER

## 2023-11-08 NOTE — Telephone Encounter (Signed)
   Patient Name: Vincent Guerrero  DOB: Jan 27, 1961 MRN: [id number]  Primary Cardiologist: Kardie Tobb, DO  Chart reviewed as part of pre-operative protocol coverage. Given past medical history and time since last visit, based on ACC/AHA guidelines, DANIS PEMBLETON is at acceptable risk for the planned procedure without further cardiovascular testing.   Per Dr. Sheena he may hold Eliquis  for 2 days prior to procedure.  LOW RISK: ACCEPTABLE TO STOP MEDICATION FOR THE TIME REQUESTED ABOVE   The patient was advised that if he develops new symptoms prior to surgery to contact our office to arrange for a follow-up visit, and he verbalized understanding.  I will route this recommendation to the requesting party via Epic fax function and remove from pre-op  pool.  Please call with questions.  Lamarr Satterfield, NP 11/08/2023, 3:07 PM

## 2023-12-10 NOTE — Progress Notes (Unsigned)
 [facility]  [address] [place],  [place]  [address] [phone]  Clinic Day:  12/11/2023  Referring physician: Edda Hadassah Dragon, PA-C   HISTORY OF PRESENT ILLNESS:  The patient is a 63 y.o. male with a history of recurrent iron deficiency anemia.  At that time, he was taking BC powders regularly, so instructed him to avoid all aspirin  and aspirin  containing products, as well as nonsteroidal anti-inflammatory drugs.  He received IV iron in the form of Feraheme  in February with normalization of his hemoglobin and improvement in his iron stores. Repeat EGD and colonoscopy in May 2025 revealed two non-bleeding superficial gastric ulcers in the gastric antrum and pre-pyloric area. The largest lesion was 8 mm in largest dimension. The antral ulcer had a friable edge. For hemostasis, two hemostatic clips  were successful and one hemostatic clip was unsuccessfully placed.  There was no bleeding at the end of the procedure.    He is here today for repeat clinical assessment.  He denies progressive fatigue concerning for recurrent anemia.   He denies any overt form of bleeding. He reports aspirin  use 3-4 times a week for back and knee pain. He has hydrocodone/APAP to use every 6 hours as needed, but states he cannot take that every day.   VITALS:   Blood pressure 114/86, pulse 64, temperature 98.4 F (36.9 C), temperature source Oral, resp. rate 16, height 5' 10 (1.778 m), weight 176 lb (79.8 kg), SpO2 100%. Wt Readings from Last 3 Encounters:  12/11/23 176 lb (79.8 kg)  07/31/23 170 lb (77.1 kg)  06/10/23 177 lb 8 oz (80.5 kg)   Body mass index is 25.25 kg/m.  Performance status (ECOG): 0 - Asymptomatic  PHYSICAL EXAM:   Physical Exam Vitals and nursing note reviewed.  Constitutional:      General: He is not in acute distress.    Appearance: Normal appearance. He is normal weight. He is not ill-appearing.  HENT:     Head: Normocephalic and atraumatic.      Mouth/Throat:     Mouth: Mucous membranes are moist.     Pharynx: Oropharynx is clear. No oropharyngeal exudate or posterior oropharyngeal erythema.  Eyes:     General: No scleral icterus.    Extraocular Movements: Extraocular movements intact.     Conjunctiva/sclera: Conjunctivae normal.     Pupils: Pupils are equal, round, and reactive to light.  Cardiovascular:     Rate and Rhythm: Normal rate and regular rhythm.     Heart sounds: Normal heart sounds. No murmur heard.    No friction rub. No gallop.  Pulmonary:     Effort: Pulmonary effort is normal.     Breath sounds: Normal breath sounds. No wheezing, rhonchi or rales.  Abdominal:     General: Bowel sounds are normal. There is no distension.     Palpations: Abdomen is soft. There is no hepatomegaly, splenomegaly or mass.     Tenderness: There is no abdominal tenderness.  Musculoskeletal:        General: Normal range of motion.     Cervical back: Normal range of motion and neck supple. No tenderness.     Right lower leg: No edema.     Left lower leg: No edema.  Lymphadenopathy:     Cervical: No cervical adenopathy.     Upper Body:     Right upper body: No supraclavicular or axillary adenopathy.     Left upper body: No supraclavicular or axillary adenopathy.  Lower Body: No right inguinal adenopathy. No left inguinal adenopathy.  Skin:    General: Skin is warm and dry.     Coloration: Skin is not jaundiced.     Findings: No rash.  Neurological:     Mental Status: He is alert and oriented to person, place, and time.     Cranial Nerves: No cranial nerve deficit.  Psychiatric:        Mood and Affect: Mood normal.        Behavior: Behavior normal.        Thought Content: Thought content normal.      LABS:      Latest Ref Rng & Units 12/11/2023    9:12 AM 06/10/2023    9:23 AM 03/12/2023    3:18 PM  CBC  WBC 4.0 - 10.5 K/uL 10.3  10.5  11.0   Hemoglobin 13.0 - 17.0 g/dL 86.3  86.8  8.8   Hematocrit 39.0 - 52.0 % 40.7   42.7  29.3   Platelets 150 - 400 K/uL 200  212  306       Latest Ref Rng & Units 09/19/2021   12:00 AM 12/19/2020   12:00 AM 11/02/2020   12:00 AM  CMP  BUN 4 - 21 27     41  21   Creatinine 0.6 - 1.3 1.1     1.2  1.3   Sodium 137 - 147 140     138  138   Potassium 3.5 - 5.1 mEq/L 4.1     3.7  4.5   Chloride 99 - 108 105     101  102   CO2 13 - 22 28     26  25    Calcium  8.7 - 10.7 9.5     9.3  9.4   Alkaline Phos 25 - 125 78     120  108   AST 14 - 40 32     47  27   ALT 10 - 40 U/L 23     60  15      This result is from an external source.    Lab Results  Component Value Date   TOTALPROTELP 6.3 09/01/2020   ALBUMINELP 3.7 09/01/2020   A1GS 0.2 09/01/2020   A2GS 0.7 09/01/2020   BETS 0.9 09/01/2020   GAMS 0.7 09/01/2020   MSPIKE Not Observed 09/01/2020   Lab Results  Component Value Date   TIBC 378 06/10/2023   TIBC 476 (H) 03/12/2023   TIBC 420 09/19/2021   FERRITIN 12 (L) 06/10/2023   FERRITIN 3 (L) 03/12/2023   FERRITIN 6 (L) 09/19/2021   IRONPCTSAT 8 (L) 06/10/2023   IRONPCTSAT 6 (L) 03/12/2023   IRONPCTSAT 6 (L) 09/19/2021   Lab Results  Component Value Date   LDH 137 09/01/2020       Component Value Date/Time   TOTALPROTELP 6.3 09/01/2020 1512   ALBUMINELP 3.7 09/01/2020 1512   A1GS 0.2 09/01/2020 1512   A2GS 0.7 09/01/2020 1512   BETS 0.9 09/01/2020 1512   GAMS 0.7 09/01/2020 1512   MSPIKE Not Observed 09/01/2020 1512   LDH 137 09/01/2020 1512    Review Flowsheet  More data exists      Latest Ref Rng & Units 09/19/2021 03/12/2023 06/10/2023  Oncology Labs  Ferritin 24 - 336 ng/mL 6  3  12    %SAT 17.9 - 39.5 % 6  6  8       STUDIES:   No  results found.    ASSESSMENT & PLAN:   Assessment/Plan:  63 y.o. male with recurrent iron deficiency anemia felt to be due to chronic GI blood loss. Unfortunately, he has resumed taking aspirin  for pain a few times a week.  He understands that this will likely lead to recurrent stomach ulcers and blood  loss.  I asked him to discuss the different plan for pain management with his PCP.  His hemoglobin remains normal.  Iron studies are pending from today.  I will plan to see him back in 6 months for repeat clinical assessment.  The patient understands all the plans discussed today and is in agreement with them.  He knows to contact our office if he develops concerns prior to his next appointment.     Andrez DELENA Foy, PA-C   Physician Assistant [facility] [place] [phone]

## 2023-12-11 ENCOUNTER — Inpatient Hospital Stay: Attending: Hematology and Oncology

## 2023-12-11 ENCOUNTER — Telehealth: Payer: Self-pay | Admitting: Hematology and Oncology

## 2023-12-11 ENCOUNTER — Other Ambulatory Visit: Payer: Self-pay

## 2023-12-11 ENCOUNTER — Encounter: Payer: Self-pay | Admitting: Hematology and Oncology

## 2023-12-11 ENCOUNTER — Inpatient Hospital Stay: Admitting: Hematology and Oncology

## 2023-12-11 VITALS — BP 114/86 | HR 64 | Temp 98.4°F | Resp 16 | Ht 70.0 in | Wt 176.0 lb

## 2023-12-11 DIAGNOSIS — D509 Iron deficiency anemia, unspecified: Secondary | ICD-10-CM | POA: Diagnosis present

## 2023-12-11 DIAGNOSIS — D500 Iron deficiency anemia secondary to blood loss (chronic): Secondary | ICD-10-CM

## 2023-12-11 LAB — IRON AND TIBC
Iron: 57 ug/dL (ref 45–182)
Saturation Ratios: 14 % — ABNORMAL LOW (ref 17.9–39.5)
TIBC: 409 ug/dL (ref 250–450)
UIBC: 352 ug/dL

## 2023-12-11 LAB — CBC WITH DIFFERENTIAL (CANCER CENTER ONLY)
Abs Immature Granulocytes: 0.03 K/uL (ref 0.00–0.07)
Basophils Absolute: 0 K/uL (ref 0.0–0.1)
Basophils Relative: 0 %
Eosinophils Absolute: 0.1 K/uL (ref 0.0–0.5)
Eosinophils Relative: 1 %
HCT: 40.7 % (ref 39.0–52.0)
Hemoglobin: 13.6 g/dL (ref 13.0–17.0)
Immature Granulocytes: 0 %
Lymphocytes Relative: 41 %
Lymphs Abs: 4.2 K/uL — ABNORMAL HIGH (ref 0.7–4.0)
MCH: 28 pg (ref 26.0–34.0)
MCHC: 33.4 g/dL (ref 30.0–36.0)
MCV: 83.9 fL (ref 80.0–100.0)
Monocytes Absolute: 0.8 K/uL (ref 0.1–1.0)
Monocytes Relative: 8 %
Neutro Abs: 5.1 K/uL (ref 1.7–7.7)
Neutrophils Relative %: 50 %
Platelet Count: 200 K/uL (ref 150–400)
RBC: 4.85 MIL/uL (ref 4.22–5.81)
RDW: 16.1 % — ABNORMAL HIGH (ref 11.5–15.5)
WBC Count: 10.3 K/uL (ref 4.0–10.5)
nRBC: 0 % (ref 0.0–0.2)

## 2023-12-11 LAB — FERRITIN: Ferritin: 30 ng/mL (ref 24–336)

## 2023-12-11 NOTE — Telephone Encounter (Signed)
 Patient has been scheduled for follow-up visit per 02/19/23 LOS.  Pt given an appt calendar with date and time.

## 2024-01-21 ENCOUNTER — Ambulatory Visit: Admitting: Cardiology

## 2024-01-21 ENCOUNTER — Encounter: Payer: Self-pay | Admitting: Cardiology

## 2024-01-21 VITALS — BP 100/64 | HR 65 | Ht 70.0 in | Wt 187.0 lb

## 2024-01-21 DIAGNOSIS — Z86711 Personal history of pulmonary embolism: Secondary | ICD-10-CM | POA: Insufficient documentation

## 2024-01-21 DIAGNOSIS — I2510 Atherosclerotic heart disease of native coronary artery without angina pectoris: Secondary | ICD-10-CM | POA: Insufficient documentation

## 2024-01-21 DIAGNOSIS — D500 Iron deficiency anemia secondary to blood loss (chronic): Secondary | ICD-10-CM | POA: Insufficient documentation

## 2024-01-21 DIAGNOSIS — Z720 Tobacco use: Secondary | ICD-10-CM | POA: Diagnosis present

## 2024-01-21 DIAGNOSIS — Z952 Presence of prosthetic heart valve: Secondary | ICD-10-CM | POA: Diagnosis present

## 2024-01-21 DIAGNOSIS — E119 Type 2 diabetes mellitus without complications: Secondary | ICD-10-CM | POA: Insufficient documentation

## 2024-01-21 DIAGNOSIS — E785 Hyperlipidemia, unspecified: Secondary | ICD-10-CM

## 2024-01-21 NOTE — Patient Instructions (Signed)
 Medication Instructions:  Your physician recommends that you continue on your current medications as directed. Please refer to the Current Medication list given to you today.  *If you need a refill on your cardiac medications before your next appointment, please call your pharmacy*  Lab Work: Lipids, Lp(a), HgbA1c If you have labs (blood work) drawn today and your tests are completely normal, you will receive your results only by: MyChart Message (if you have MyChart) OR A paper copy in the mail If you have any lab test that is abnormal or we need to change your treatment, we will call you to review the results.  Follow-Up: At [facility], you and your health needs are our priority.  As part of our continuing mission to provide you with exceptional heart care, our providers are all part of one team.  This team includes your primary Cardiologist (physician) and Advanced Practice Providers or APPs (Physician Assistants and Nurse Practitioners) who all work together to provide you with the care you need, when you need it.  Your next appointment:   1 year(s)  Provider:   Kardie Tobb, DO     Other Instructions:

## 2024-01-22 LAB — LIPID PANEL
Chol/HDL Ratio: 3.4 ratio (ref 0.0–5.0)
Cholesterol, Total: 171 mg/dL (ref 100–199)
HDL: 50 mg/dL (ref >39)
LDL Chol Calc (NIH): 76 mg/dL (ref 0–99)
Triglycerides: 278 mg/dL — ABNORMAL HIGH (ref 0–149)
VLDL Cholesterol Cal: 45 mg/dL — ABNORMAL HIGH (ref 5–40)

## 2024-01-22 LAB — LIPOPROTEIN A (LPA): Lipoprotein (a): 191.4 nmol/L — ABNORMAL HIGH (ref <75.0)

## 2024-01-22 LAB — HEMOGLOBIN A1C
Est. average glucose Bld gHb Est-mCnc: 128 mg/dL
Hgb A1c MFr Bld: 6.1 % — ABNORMAL HIGH (ref 4.8–5.6)

## 2024-01-23 ENCOUNTER — Ambulatory Visit: Payer: Self-pay | Admitting: Cardiology

## 2024-01-23 NOTE — Progress Notes (Signed)
 Cardiology Office Note:    Date:  01/23/2024   ID:  Vincent Guerrero, DOB [birth year], MRN [id number]  PCP:  Edda Hadassah Dragon, PA-C  Cardiologist:  Dub Huntsman, DO  Electrophysiologist:  None   Referring MD: Edda Hadassah Dragon, PA-C    I am ok  History of Present Illness:    Vincent Guerrero is a 63 y.o. male with a hx of coronary artery disease status post PCI and recently CABG x4 with aortic valve replacement from aortic stenosis in September 2022, hyperlipidemia, diabetes mellitus, history of CVA, chronic iron deficiency anemia has had iron infusions, pulmonary embolism postoperative and paroxysmal atrial fibrillation on Eliquis  here today for follow-up visit.   His last visit with me was in may 2023. At that time he was doing well from a CV standpoint.   His most recent visit in our office was 07/2023 with Lum Louis, NP at that time an echo was repeated and he was planning for surgery.   Today he offers no complaints.   Past Medical History:  Diagnosis Date   Age-related nuclear cataract of both eyes 11/27/2018   Anemia    Bilateral recurrent inguinal hernia without obstruction or gangrene 05/09/2016   Added automatically from request for surgery [id number]   Coronary artery disease involving native coronary artery without angina pectoris 05/2016   History of PCI to nondominant RCA. ->  Cath in April 2018 showed occluded nondominant RCA stent.  30% LM.  pLAD 40%.  Ostial RI 60%.   Cortical age-related cataract of both eyes 11/27/2018   Depression    Diabetes mellitus (HCC)    Hyperlipidemia LDL goal <70    Hypertension    Inguinal hernia without obstruction or gangrene    BILATERAL   Primary open angle glaucoma (POAG) of both eyes, severe stage 11/27/2018   Primary open angle glaucoma (POAG) of left eye, severe stage 11/27/2018   Stroke (HCC)    Tobacco abuse 11/05/2019   Tobacco use 11/06/2019    Past Surgical History:  Procedure Laterality Date    AORTIC VALVE REPLACEMENT N/A 10/14/2020   Procedure: AORTIC VALVE REPLACEMENT (AVR) USING INSPIRIS RESILIA AORTIC VALVE;  Surgeon: Lucas Dorise POUR, MD;  Location: [facility] OR;  Service: Open Heart Surgery;  Laterality: N/A;   BIOPSY  11/27/2019   Procedure: BIOPSY;  Surgeon: Legrand Victory LITTIE DOUGLAS, MD;  Location: [facility] ENDOSCOPY;  Service: Gastroenterology;;   COLONOSCOPY WITH PROPOFOL  N/A 11/27/2019   Procedure: COLONOSCOPY WITH PROPOFOL ;  Surgeon: Legrand Victory LITTIE DOUGLAS, MD;  Location: [facility] ENDOSCOPY;  Service: Gastroenterology;  Laterality: N/A;   CORONARY ARTERY BYPASS GRAFT N/A 10/14/2020   Procedure: CORONARY ARTERY BYPASS GRAFTING (CABG) TIMES FOUR, USING LEFT INTERNAL MAMMARY ARTERY AND RIGHT GREATER SAPHENOUS VEIN HARVESTED ENDOSCOPICALLY;  Surgeon: Lucas Dorise POUR, MD;  Location: [facility] OR;  Service: Open Heart Surgery;  Laterality: N/A;   CORONARY ULTRASOUND/IVUS N/A 09/12/2020   Procedure: Intravascular Ultrasound/IVUS;  Surgeon: Anner Alm ORN, MD;  Location: [facility] INVASIVE CV LAB;  Service: Cardiovascular;  Laterality: N/A;   ESOPHAGOGASTRODUODENOSCOPY (EGD) WITH PROPOFOL  N/A 11/27/2019   Procedure: ESOPHAGOGASTRODUODENOSCOPY (EGD) WITH PROPOFOL ;  Surgeon: Legrand Victory LITTIE DOUGLAS, MD;  Location: [facility] ENDOSCOPY;  Service: Gastroenterology;  Laterality: N/A;   HEMOSTASIS CLIP PLACEMENT  11/27/2019   Procedure: HEMOSTASIS CLIP PLACEMENT;  Surgeon: Legrand Victory LITTIE DOUGLAS, MD;  Location: [facility] ENDOSCOPY;  Service: Gastroenterology;;   HERNIA REPAIR  1998   LEFT HEART CATH AND CORONARY ANGIOGRAPHY N/A 06/04/2016   Procedure: Left  Heart Cath and Coronary Angiography;  Surgeon: Victory LELON Sharps, MD;  Location: [facility] INVASIVE CV LAB;  Service: Cardiovascular;  Laterality: N/A;   RIGHT/LEFT HEART CATH AND CORONARY ANGIOGRAPHY N/A 09/12/2020   Procedure: RIGHT/LEFT HEART CATH AND CORONARY ANGIOGRAPHY;  Surgeon: Anner Alm LELON, MD;  Location: [facility] INVASIVE CV LAB;  Service: Cardiovascular;  Laterality: N/A;   TEE WITHOUT CARDIOVERSION N/A  08/30/2020   Procedure: TRANSESOPHAGEAL ECHOCARDIOGRAM (TEE);  Surgeon: Alveta Aleene PARAS, MD;  Location: [facility] ENDOSCOPY;  Service: Cardiovascular;  Laterality: N/A;   TEE WITHOUT CARDIOVERSION N/A 10/14/2020   Procedure: TRANSESOPHAGEAL ECHOCARDIOGRAM (TEE);  Surgeon: Lucas Dorise POUR, MD;  Location: [facility] OR;  Service: Open Heart Surgery;  Laterality: N/A;    Current Medications: Active Medications[1]   Allergies:   Butamben -tetracaine -benzocaine    Social History   Socioeconomic History   Marital status: Divorced    Spouse name: Not on file   Number of children: 5   Years of education: 12   Highest education level: Not on file  Occupational History   Not on file  Tobacco Use   Smoking status: Every Day    Current packs/day: 1.00    Average packs/day: 1 pack/day for 40.0 years (40.0 ttl pk-yrs)    Types: Cigarettes   Smokeless tobacco: Current    Types: Chew   Tobacco comments:    half pack daily-05/05/2020-AH    CHEWS PERIODICALLY - 03-12-2023 - TNB  Vaping Use   Vaping status: Never Used  Substance and Sexual Activity   Alcohol use: No   Drug use: Yes    Types: Marijuana    Comment: 1-2 times per month   Sexual activity: Yes  Other Topics Concern   Not on file  Social History Narrative   Not on file   Social Drivers of Health   Tobacco Use: High Risk (01/21/2024)   Patient History    Smoking Tobacco Use: Every Day    Smokeless Tobacco Use: Current    Passive Exposure: Not on file  Financial Resource Strain: Not on file  Food Insecurity: Food Insecurity Present (03/12/2023)   Hunger Vital Sign    Worried About Radiation Protection Practitioner of Food in the Last Year: Never true    Ran Out of Food in the Last Year: Sometimes true  Transportation Needs: Unmet Transportation Needs (03/12/2023)   PRAPARE - Administrator, Civil Service (Medical): Yes    Lack of Transportation (Non-Medical): Yes  Physical Activity: Not on file  Stress: Not on file  Social Connections: Unknown  (06/20/2021)   Received from [facility]   Social Network    Social Network: Not on file  Depression (PHQ2-9): Low Risk (12/11/2023)   Depression (PHQ2-9)    PHQ-2 Score: 0  Alcohol Screen: Not on file  Housing: Not on file  Utilities: At Risk (03/12/2023)   AHC Utilities    Threatened with loss of utilities: Yes  Health Literacy: Not on file     Family History: The patient's family history includes CAD in his father; Diabetes in his brother and father; Glaucoma in his mother and sister; Heart failure in his mother; Hypertension in his mother and sister; Prostate cancer in his brother.  ROS:   Review of Systems  Constitution: Negative for decreased appetite, fever and weight gain.  HENT: Negative for congestion, ear discharge, hoarse voice and sore throat.   Eyes: Negative for discharge, redness, vision loss in right eye and visual halos.  Cardiovascular: Negative for chest pain, dyspnea  on exertion, leg swelling, orthopnea and palpitations.  Respiratory: Negative for cough, hemoptysis, shortness of breath and snoring.   Endocrine: Negative for heat intolerance and polyphagia.  Hematologic/Lymphatic: Negative for bleeding problem. Does not bruise/bleed easily.  Skin: Negative for flushing, nail changes, rash and suspicious lesions.  Musculoskeletal: Negative for arthritis, joint pain, muscle cramps, myalgias, neck pain and stiffness.  Gastrointestinal: Negative for abdominal pain, bowel incontinence, diarrhea and excessive appetite.  Genitourinary: Negative for decreased libido, genital sores and incomplete emptying.  Neurological: Negative for brief paralysis, focal weakness, headaches and loss of balance.  Psychiatric/Behavioral: Negative for altered mental status, depression and suicidal ideas.  Allergic/Immunologic: Negative for HIV exposure and persistent infections.    EKGs/Labs/Other Studies Reviewed:    The following studies were reviewed today:   EKG:  The ekg ordered  today demonstrates   Recent Labs: 12/11/2023: Hemoglobin 13.6; Platelet Count 200  Recent Lipid Panel    Component Value Date/Time   CHOL 171 01/21/2024 1050   TRIG 278 (H) 01/21/2024 1050   HDL 50 01/21/2024 1050   CHOLHDL 3.4 01/21/2024 1050   CHOLHDL 2.2 11/27/2019 1004   VLDL 12 11/27/2019 1004   LDLCALC 76 01/21/2024 1050    Physical Exam:    VS:  BP 100/64 (BP Location: Right Arm, Patient Position: Sitting, Cuff Size: Normal)   Pulse 65   Ht 5' 10 (1.778 m)   Wt 187 lb (84.8 kg)   SpO2 95%   BMI 26.83 kg/m     Wt Readings from Last 3 Encounters:  01/21/24 187 lb (84.8 kg)  12/11/23 176 lb (79.8 kg)  07/31/23 170 lb (77.1 kg)     GEN: Well nourished, well developed in no acute distress HEENT: Normal NECK: No JVD; No carotid bruits LYMPHATICS: No lymphadenopathy CARDIAC: S1S2 noted,RRR, no murmurs, rubs, gallops RESPIRATORY:  Clear to auscultation without rales, wheezing or rhonchi  ABDOMEN: Soft, non-tender, non-distended, +bowel sounds, no guarding. EXTREMITIES: No edema, No cyanosis, no clubbing MUSCULOSKELETAL:  No deformity  SKIN: Warm and dry NEUROLOGIC:  Alert and oriented x 3, non-focal PSYCHIATRIC:  Normal affect, good insight  ASSESSMENT:    1. DM type 2, goal HbA1c < 7% (HCC)   2. Hyperlipidemia LDL goal <70   3. Coronary artery disease involving native coronary artery of native heart without angina pectoris   4. S/P AVR (aortic valve replacement)   5. History of pulmonary embolism   6. Iron deficiency anemia due to chronic blood loss   7. Tobacco use    PLAN:    CAD - no angina symptoms. Continue current dose of Lipitor  80 mg daily.   Hyperlipidemia - continue Lipitor  80 mg daily for now. Goal is < 55, will repeat lipid panel today.   Status post AVR - recent echo. Bioprosthetic valve well seated.   Hx of pulmonary embolism - on anticoagulation   PAF - continue Eliquis  and Lopressor .   Tobacco use - cessation information provided.     The patient is in agreement with the above plan. The patient left the office in stable condition.  The patient will follow up in   Medication Adjustments/Labs and Tests Ordered: Current medicines are reviewed at length with the patient today.  Concerns regarding medicines are outlined above.  Orders Placed This Encounter  Procedures   Hemoglobin A1c   Lipid panel   Lipoprotein A (LPA)   No orders of the defined types were placed in this encounter.   Patient Instructions  Medication Instructions:  Your physician recommends that you continue on your current medications as directed. Please refer to the Current Medication list given to you today.  *If you need a refill on your cardiac medications before your next appointment, please call your pharmacy*  Lab Work: Lipids, Lp(a), HgbA1c If you have labs (blood work) drawn today and your tests are completely normal, you will receive your results only by: MyChart Message (if you have MyChart) OR A paper copy in the mail If you have any lab test that is abnormal or we need to change your treatment, we will call you to review the results.  Follow-Up: At [facility], you and your health needs are our priority.  As part of our continuing mission to provide you with exceptional heart care, our providers are all part of one team.  This team includes your primary Cardiologist (physician) and Advanced Practice Providers or APPs (Physician Assistants and Nurse Practitioners) who all work together to provide you with the care you need, when you need it.  Your next appointment:   1 year(s)  Provider:   Keelyn Monjaras, DO     Other Instructions:     Adopting a Healthy Lifestyle.  Know what a healthy weight is for you (roughly BMI <25) and aim to maintain this   Aim for 7+ servings of fruits and vegetables daily   65-80+ fluid ounces of water or unsweet tea for healthy kidneys   Limit to max 1 drink of alcohol per day; avoid  smoking/tobacco   Limit animal fats in diet for cholesterol and heart health - choose grass fed whenever available   Avoid highly processed foods, and foods high in saturated/trans fats   Aim for low stress - take time to unwind and care for your mental health   Aim for 150 min of moderate intensity exercise weekly for heart health, and weights twice weekly for bone health   Aim for 7-9 hours of sleep daily   When it comes to diets, agreement about the perfect plan isnt easy to find, even among the experts. Experts at the [facility] of [employer] developed an idea known as the Healthy Eating Plate. Just imagine a plate divided into logical, healthy portions.   The emphasis is on diet quality:   Load up on vegetables and fruits - one-half of your plate: Aim for color and variety, and remember that potatoes dont count.   Go for whole grains - one-quarter of your plate: Whole wheat, barley, wheat berries, quinoa, oats, brown rice, and foods made with them. If you want pasta, go with whole wheat pasta.   Protein power - one-quarter of your plate: Fish, chicken, beans, and nuts are all healthy, versatile protein sources. Limit red meat.   The diet, however, does go beyond the plate, offering a few other suggestions.   Use healthy plant oils, such as olive, canola, soy, corn, sunflower and peanut. Check the labels, and avoid partially hydrogenated oil, which have unhealthy trans fats.   If youre thirsty, drink water. Coffee and tea are good in moderation, but skip sugary drinks and limit milk and dairy products to one or two daily servings.   The type of carbohydrate in the diet is more important than the amount. Some sources of carbohydrates, such as vegetables, fruits, whole grains, and beans-are healthier than others.   Finally, stay active  Signed, Daveion Robar, DO  01/23/2024 5:43 PM    [facility]    [  1]  Current Meds  Medication Sig    apixaban  (ELIQUIS ) 5 MG TABS tablet Take 1 tablet (5 mg total) by mouth 2 (two) times daily.   atorvastatin  (LIPITOR ) 80 MG tablet Take 1 tablet (80 mg total) by mouth daily.   brimonidine (ALPHAGAN P) 0.1 % SOLN INSTILL 1 DROP INTO AFFECTED EYE(S) BY OPHTHALMIC ROUTE EVERY 8 HOURS   DULoxetine (CYMBALTA) 30 MG capsule Take 30 mg by mouth daily.   hydrochlorothiazide (HYDRODIURIL) 25 MG tablet Take 25 mg by mouth daily.   HYDROcodone-acetaminophen  (NORCO/VICODIN) 5-325 MG tablet Take 1 tablet every 6-8 hours by oral route as needed for 7 days, for pain.   JARDIANCE  10 MG TABS tablet Take 10 mg by mouth daily.   loperamide (IMODIUM) 2 MG capsule Take by mouth.   metFORMIN  (GLUCOPHAGE ) 1000 MG tablet Take 1,000 mg by mouth 2 (two) times daily with a meal.   metoprolol  (LOPRESSOR ) 50 MG tablet Take 1 tablet (50 mg total) by mouth 2 (two) times daily.   ROCKLATAN  0.02-0.005 % SOLN INSTILL 1 DROP INTO AFFECTED EYE(S) BY OPHTHALMIC ROUTE ONCE DAILY INTHE EVENING   tamsulosin (FLOMAX) 0.4 MG CAPS capsule Take 0.4 mg by mouth daily.   traZODone (DESYREL) 50 MG tablet Take 50-150 mg by mouth at bedtime as needed.   valsartan  (DIOVAN ) 160 MG tablet Take 160 mg by mouth daily.

## 2024-06-09 ENCOUNTER — Inpatient Hospital Stay

## 2024-06-09 ENCOUNTER — Inpatient Hospital Stay: Admitting: Hematology and Oncology
